# Patient Record
Sex: Male | Born: 1975 | Race: White | Hispanic: No | Marital: Married | Smoking: Never smoker
Health system: Southern US, Community
[De-identification: ages and names within clinical notes are randomized; demographics above are authoritative.]

## PROBLEM LIST (undated history)

## (undated) DIAGNOSIS — M1000 Idiopathic gout, unspecified site: Secondary | ICD-10-CM

## (undated) DIAGNOSIS — D751 Secondary polycythemia: Secondary | ICD-10-CM

## (undated) DIAGNOSIS — I82409 Acute embolism and thrombosis of unspecified deep veins of unspecified lower extremity: Secondary | ICD-10-CM

## (undated) DIAGNOSIS — M79671 Pain in right foot: Secondary | ICD-10-CM

## (undated) DIAGNOSIS — I10 Essential (primary) hypertension: Secondary | ICD-10-CM

## (undated) DIAGNOSIS — F419 Anxiety disorder, unspecified: Secondary | ICD-10-CM

## (undated) DIAGNOSIS — I509 Heart failure, unspecified: Secondary | ICD-10-CM

## (undated) HISTORY — DX: Essential (primary) hypertension: I10

## (undated) HISTORY — DX: Idiopathic gout, unspecified site: M10.00

## (undated) HISTORY — PX: OTHER SURGICAL HISTORY: SHX169

---

## 2004-02-23 ENCOUNTER — Emergency Department (HOSPITAL_COMMUNITY): Admission: EM | Admit: 2004-02-23 | Discharge: 2004-02-23 | Payer: Self-pay | Admitting: *Deleted

## 2005-08-10 ENCOUNTER — Ambulatory Visit (HOSPITAL_COMMUNITY): Admission: RE | Admit: 2005-08-10 | Discharge: 2005-08-10 | Payer: Self-pay | Admitting: Family Medicine

## 2008-01-12 ENCOUNTER — Emergency Department (HOSPITAL_COMMUNITY): Admission: EM | Admit: 2008-01-12 | Discharge: 2008-01-13 | Payer: Self-pay | Admitting: Emergency Medicine

## 2009-08-27 ENCOUNTER — Emergency Department (HOSPITAL_COMMUNITY): Admission: EM | Admit: 2009-08-27 | Discharge: 2009-08-27 | Payer: Self-pay | Admitting: Emergency Medicine

## 2010-11-24 LAB — DIFFERENTIAL
Basophils Absolute: 0
Basophils Relative: 0
Eosinophils Absolute: 0.2
Lymphs Abs: 2.7
Monocytes Absolute: 0.5
Neutro Abs: 3.3
Neutrophils Relative %: 49

## 2010-11-24 LAB — CBC
HCT: 43.3
Hemoglobin: 15.4
MCHC: 35.6
MCV: 95
RBC: 4.56

## 2010-11-24 LAB — COMPREHENSIVE METABOLIC PANEL
AST: 34
Albumin: 4.1
Alkaline Phosphatase: 43
BUN: 13
Calcium: 8.9
Creatinine, Ser: 1.01
Potassium: 3.6
Sodium: 139

## 2010-11-24 LAB — POCT CARDIAC MARKERS
Myoglobin, poc: 55.8
Troponin i, poc: <0.05

## 2011-06-24 ENCOUNTER — Encounter (INDEPENDENT_AMBULATORY_CARE_PROVIDER_SITE_OTHER): Payer: Self-pay | Admitting: Surgery

## 2011-06-28 ENCOUNTER — Ambulatory Visit (INDEPENDENT_AMBULATORY_CARE_PROVIDER_SITE_OTHER): Payer: Worker's Compensation | Admitting: Surgery

## 2011-06-28 ENCOUNTER — Encounter (INDEPENDENT_AMBULATORY_CARE_PROVIDER_SITE_OTHER): Payer: Self-pay | Admitting: Surgery

## 2011-06-28 VITALS — BP 132/86 | HR 98 | Temp 97.5°F | Resp 16 | Ht 74.0 in | Wt 240.6 lb

## 2011-06-28 DIAGNOSIS — K4090 Unilateral inguinal hernia, without obstruction or gangrene, not specified as recurrent: Secondary | ICD-10-CM | POA: Insufficient documentation

## 2011-06-28 NOTE — Progress Notes (Signed)
Patient ID: Ryan Gibson, male   DOB: [birth year], 36 y.o.   MRN: [id number]  Chief Complaint  Patient presents with  . New Evaluation    New Pt. Eval ing hernia    HPI Ryan Gibson is a 36 y.o. male.  Workman's Comp - [name]; [employer]  [phone] Ryan Gibson [email or web address]  HPI The patient comes in for a second opinion regarding a right inguinal hernia.  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 He was participating in physical training when he felt a pop in his right groin. He has had severe pain in his groin since that time. Reportedly he had an ultrasound performed near [facility] that showed an inguinal hernia. He was evaluated by a surgeon at [facility] but they were unable to palpate a hernia. He remains fairly tender in his right groin. This pain extends down into his right testicle. He comes in today for surgical evaluation Past Medical History  Diagnosis Date  . Asthma   . Hypertension     History reviewed. No pertinent past surgical history.  Family History  Problem Relation Age of Onset  . Heart disease Father     Social History History  Substance Use Topics  . Smoking status: Never Smoker   . Smokeless tobacco: Not on file  . Alcohol Use: Yes     Social    No Known Allergies  Current Outpatient Prescriptions  Medication Sig Dispense Refill  . HYDROcodone-acetaminophen (NORCO) 5-325 MG per tablet Take 1 tablet by mouth every 6 (six) hours as needed.      Marland Kitchen lisinopril (PRINIVIL,ZESTRIL) 20 MG tablet Take 20 mg by mouth daily.        Review of Systems Review of Systems  Constitutional: Negative for fever, chills and unexpected weight change.  HENT: Negative for hearing loss, congestion, sore throat, trouble swallowing and voice change.   Eyes: Negative for visual disturbance.  Respiratory: Negative for cough and wheezing.   Cardiovascular: Negative for chest pain,  palpitations and leg swelling.  Gastrointestinal: Negative for nausea, vomiting, abdominal pain, diarrhea, constipation, blood in stool, abdominal distention, anal bleeding and rectal pain.  Genitourinary: Positive for testicular pain. Negative for hematuria and difficulty urinating.  Musculoskeletal: Negative for arthralgias.  Skin: Negative for rash and wound.  Neurological: Negative for seizures, syncope, weakness and headaches.  Hematological: Negative for adenopathy. Does not bruise/bleed easily.  Psychiatric/Behavioral: Negative for confusion.    Blood pressure 132/86, pulse 98, temperature 97.5 F (36.4 C), temperature source Temporal, resp. rate 16, height 6\' 2"  (1.88 m), weight 240 lb 9.6 oz (109.135 kg).  Physical Exam Physical Exam WDWN in NAD HEENT:  EOMI, sclera anicteric Neck:  No masses, no thyromegaly Lungs:  CTA bilaterally; normal respiratory effort CV:  Regular rate and rhythm; no murmurs Abd:  +bowel sounds, soft, non-tender, no masses GU:  Bilateral descended testes with no testicular masses; tender right testicle; No sign of left inguinal hernia Right inguinal hernia - small spontaneously reducible hernia with significant tenderness at the external ring Ext:  Well-perfused; no edema Skin:  Warm, dry; no sign of jaundice  Data Reviewed none  Assessment    Right inguinal hernia      Plan    Right inguinal hernia repair with mesh.  The surgical procedure has been discussed with the patient.  Potential risks, benefits, alternative treatments, and expected outcomes have been explained.  All of the patient's questions at  this time have been answered.  The likelihood of reaching the patient's treatment goal is good.  The patient understand the proposed surgical procedure and wishes to proceed.         Ryan Gibson K. 06/28/2011, 11:22 AM

## 2011-07-04 ENCOUNTER — Encounter (HOSPITAL_COMMUNITY): Payer: Self-pay | Admitting: *Deleted

## 2011-07-04 ENCOUNTER — Encounter (HOSPITAL_COMMUNITY): Payer: Self-pay

## 2011-07-05 ENCOUNTER — Encounter (HOSPITAL_COMMUNITY)
Admission: RE | Admit: 2011-07-05 | Discharge: 2011-07-05 | Disposition: A | Discharge status: Home / Self Care | Payer: Worker's Compensation | Source: Ambulatory Visit | Attending: Surgery | Admitting: Surgery

## 2011-07-05 ENCOUNTER — Ambulatory Visit (HOSPITAL_COMMUNITY)
Admission: RE | Admit: 2011-07-05 | Discharge: 2011-07-05 | Disposition: A | Discharge status: Home / Self Care | Payer: Worker's Compensation | Source: Ambulatory Visit | Attending: Surgery | Admitting: Surgery

## 2011-07-05 ENCOUNTER — Encounter (HOSPITAL_COMMUNITY): Payer: Self-pay | Admitting: Pharmacy Technician

## 2011-07-05 DIAGNOSIS — Z01818 Encounter for other preprocedural examination: Secondary | ICD-10-CM | POA: Insufficient documentation

## 2011-07-05 DIAGNOSIS — K4090 Unilateral inguinal hernia, without obstruction or gangrene, not specified as recurrent: Secondary | ICD-10-CM | POA: Insufficient documentation

## 2011-07-05 LAB — BASIC METABOLIC PANEL
CO2: 28 mEq/L (ref 19–32)
Calcium: 9.3 mg/dL (ref 8.4–10.5)
Chloride: 103 mEq/L (ref 96–112)
Creatinine, Ser: 0.96 mg/dL (ref 0.50–1.35)
Potassium: 3.8 mEq/L (ref 3.5–5.1)
Sodium: 140 mEq/L (ref 135–145)

## 2011-07-05 LAB — CBC
Hemoglobin: 15.6 g/dL (ref 13.0–17.0)
MCH: 33 pg (ref 26.0–34.0)
MCHC: 36.4 g/dL — ABNORMAL HIGH (ref 30.0–36.0)
MCV: 90.7 fL (ref 78.0–100.0)
RBC: 4.73 MIL/uL (ref 4.22–5.81)
RDW: 11.9 % (ref 11.5–15.5)
WBC: 6.4 10*3/uL (ref 4.0–10.5)

## 2011-07-05 LAB — SURGICAL PCR SCREEN: MRSA, PCR: POSITIVE — AB

## 2011-07-05 NOTE — Patient Instructions (Addendum)
YOUR SURGERY IS SCHEDULED ON:  WED  5/22  AT 1:25 PM  REPORT TO [facility] AT:  11:00 AM      PHONE # FOR SHORT STAY IS [phone]  DO NOT EAT ANYTHING AFTER MIDNIGHT THE NIGHT BEFORE YOUR SURGERY.   NO FOOD, NO CHEWING GUM, NO MINTS, NO CANDIES, NO CHEWING TOBACCO.   YOU MAY HAVE CLEAR LIQUIDS TO DRINK FROM MIDNIGHT UNTIL 7:00 AM THE DAY OF YOUR SURGERY--NOTHING TO DRINK AFTER 7AM DAY OF SURGERY!!!       CLEAR LIQUIDS SUCH AS WATER, SODA, TEA--NO MILK OR MILK PRODUCTS.  PLEASE TAKE THE FOLLOWING MEDICATIONS THE AM OF YOUR SURGERY WITH A FEW SIPS OF WATER: HYDROCODONE IF NEEDED FOR PAIN    IF YOU USE INHALERS--USE YOUR INHALERS THE AM OF YOUR SURGERY AND BRING INHALERS TO THE HOSPITAL -TAKE TO SURGERY.    IF YOU ARE DIABETIC:  DO NOT TAKE ANY DIABETIC MEDICATIONS THE AM OF YOUR SURGERY.  IF YOU TAKE INSULIN IN THE EVENINGS--PLEASE ONLY TAKE 1/2 NORMAL EVENING DOSE THE NIGHT BEFORE YOUR SURGERY.  NO INSULIN THE AM OF YOUR SURGERY.  IF YOU HAVE SLEEP APNEA AND USE CPAP OR BIPAP--PLEASE BRING THE MASK --NOT THE MACHINE-NOT THE TUBING   -JUST THE MASK. DO NOT BRING VALUABLES, MONEY, CREDIT CARDS.  CONTACT LENS, DENTURES / PARTIALS, GLASSES SHOULD NOT BE WORN TO SURGERY AND IN MOST CASES-HEARING AIDS WILL NEED TO BE REMOVED.  BRING YOUR GLASSES CASE, ANY EQUIPMENT NEEDED FOR YOUR CONTACT LENS. FOR PATIENTS ADMITTED TO THE HOSPITAL--CHECK OUT TIME THE DAY OF DISCHARGE IS 11:00 AM.  ALL INPATIENT ROOMS ARE PRIVATE - WITH BATHROOM, TELEPHONE, TELEVISION AND WIFI INTERNET. IF YOU ARE BEING DISCHARGED THE SAME DAY OF YOUR SURGERY--YOU CAN NOT DRIVE YOURSELF HOME--AND SHOULD NOT GO HOME ALONE BY TAXI OR BUS.  NO DRIVING OR OPERATING MACHINERY FOR 24 HOURS FOLLOWING ANESTHESIA / PAIN MEDICATIONS.                            SPECIAL INSTRUCTIONS:  CHLORHEXIDINE SOAP SHOWER (other brand names are Betasept and Hibiclens ) PLEASE SHOWER WITH CHLORHEXIDINE THE NIGHT BEFORE YOUR SURGERY AND  THE AM OF YOUR SURGERY. DO NOT USE CHLORHEXIDINE ON YOUR FACE OR PRIVATE AREAS--YOU MAY USE YOUR NORMAL SOAP THOSE AREAS AND YOUR NORMAL SHAMPOO.  WOMEN SHOULD AVOID SHAVING UNDER ARMS AND SHAVING LEGS 48 HOURS BEFORE USING CHLORHEXIDINE TO AVOID SKIN IRRITATION.  DO NOT USE IF ALLERGIC TO CHLORHEXIDINE.  PLEASE READ OVER ANY  FACT SHEETS THAT YOU WERE GIVEN: MRSA INFORMATION

## 2011-07-05 NOTE — Pre-Procedure Instructions (Signed)
PT BROUGHT HIS EXERCISE STRESS TEST REPORT DONE [facility]/07/11 BY [facility] - "NORMAL STRESS TEST".  COPY MADE OF TEST RESULT AND PRETEST EKGS AND PLACED ON PT'S CHART. CBC, BMET, CXR WERE DONE TODAY AT [facility] PREOP.

## 2011-07-06 ENCOUNTER — Other Ambulatory Visit (INDEPENDENT_AMBULATORY_CARE_PROVIDER_SITE_OTHER): Payer: Self-pay | Admitting: Surgery

## 2011-07-06 MED ORDER — VANCOMYCIN HCL 1000 MG IV SOLR: 1000.0000 mg | Freq: Once | INTRAVENOUS | Status: DC

## 2011-07-06 NOTE — Pre-Procedure Instructions (Signed)
DR. Gennie Alma NOTE FROM YOUR OFFICE THAT YOU HAVE ORDERED VANCOMYCIN TO PT'S PREOP ANTIBIOTICS--BUT ORDER DOES NOT APPEAR IN EPIC AS OF THIS AM

## 2011-07-06 NOTE — Progress Notes (Signed)
Quick Note:  This patient may proceed with surgery. I have added Vancomycin to his preop antibiotics.  ______

## 2011-07-13 ENCOUNTER — Encounter (HOSPITAL_COMMUNITY): Payer: Self-pay | Admitting: Anesthesiology

## 2011-07-13 ENCOUNTER — Encounter (HOSPITAL_COMMUNITY)
Admission: RE | Disposition: A | Discharge status: Home / Self Care | Payer: Self-pay | Source: Ambulatory Visit | Attending: Surgery

## 2011-07-13 ENCOUNTER — Encounter (HOSPITAL_COMMUNITY): Payer: Self-pay | Admitting: *Deleted

## 2011-07-13 ENCOUNTER — Ambulatory Visit (HOSPITAL_COMMUNITY): Payer: Worker's Compensation

## 2011-07-13 ENCOUNTER — Ambulatory Visit (HOSPITAL_COMMUNITY)
Admission: RE | Admit: 2011-07-13 | Discharge: 2011-07-13 | Disposition: A | Discharge status: Home / Self Care | Payer: Worker's Compensation | Source: Ambulatory Visit | Attending: Surgery | Admitting: Surgery

## 2011-07-13 ENCOUNTER — Ambulatory Visit (HOSPITAL_COMMUNITY): Payer: Worker's Compensation | Admitting: Anesthesiology

## 2011-07-13 DIAGNOSIS — K4090 Unilateral inguinal hernia, without obstruction or gangrene, not specified as recurrent: Secondary | ICD-10-CM

## 2011-07-13 DIAGNOSIS — M25579 Pain in unspecified ankle and joints of unspecified foot: Secondary | ICD-10-CM | POA: Insufficient documentation

## 2011-07-13 DIAGNOSIS — J45909 Unspecified asthma, uncomplicated: Secondary | ICD-10-CM | POA: Insufficient documentation

## 2011-07-13 DIAGNOSIS — I10 Essential (primary) hypertension: Secondary | ICD-10-CM | POA: Insufficient documentation

## 2011-07-13 DIAGNOSIS — Z79899 Other long term (current) drug therapy: Secondary | ICD-10-CM | POA: Insufficient documentation

## 2011-07-13 HISTORY — DX: Pain in right foot: M79.671

## 2011-07-13 HISTORY — PX: INGUINAL HERNIA REPAIR: SHX194

## 2011-07-13 HISTORY — PX: HERNIA REPAIR: SHX51

## 2011-07-13 SURGERY — REPAIR, HERNIA, INGUINAL, ADULT
Anesthesia: General | Site: Groin | Laterality: Right | Wound class: Clean

## 2011-07-13 MED ORDER — ONDANSETRON HCL 4 MG/2ML IJ SOLN: 4.0000 mg | INTRAMUSCULAR | Status: DC | PRN

## 2011-07-13 MED ORDER — MIDAZOLAM HCL 5 MG/5ML IJ SOLN
INTRAMUSCULAR | Status: DC | PRN
  Administered 2011-07-13: 2 mg via INTRAVENOUS

## 2011-07-13 MED ORDER — VANCOMYCIN HCL 1000 MG IV SOLR
1500.0000 mg | Freq: Once | INTRAVENOUS | Status: AC
  Administered 2011-07-13: 1500 mg via INTRAVENOUS
  Filled 2011-07-13: qty 1500

## 2011-07-13 MED ORDER — OXYCODONE-ACETAMINOPHEN 5-325 MG PO TABS
ORAL_TABLET | ORAL | Status: AC
  Filled 2011-07-13: qty 1

## 2011-07-13 MED ORDER — FENTANYL CITRATE 0.05 MG/ML IJ SOLN
INTRAMUSCULAR | Status: DC | PRN
  Administered 2011-07-13: 100 ug via INTRAVENOUS
  Administered 2011-07-13 (×2): 50 ug via INTRAVENOUS

## 2011-07-13 MED ORDER — DIPHENHYDRAMINE HCL 50 MG/ML IJ SOLN
INTRAMUSCULAR | Status: AC
  Filled 2011-07-13: qty 1

## 2011-07-13 MED ORDER — CEFAZOLIN SODIUM-DEXTROSE 2-3 GM-% IV SOLR: 2.0000 g | INTRAVENOUS | Status: DC

## 2011-07-13 MED ORDER — HYDROMORPHONE HCL PF 1 MG/ML IJ SOLN
INTRAMUSCULAR | Status: AC
  Filled 2011-07-13: qty 1

## 2011-07-13 MED ORDER — PROMETHAZINE HCL 25 MG/ML IJ SOLN: 6.2500 mg | INTRAMUSCULAR | Status: DC | PRN

## 2011-07-13 MED ORDER — FENTANYL CITRATE 0.05 MG/ML IJ SOLN
50.0000 ug | INTRAMUSCULAR | Status: DC | PRN
  Administered 2011-07-13: 100 ug via INTRAVENOUS

## 2011-07-13 MED ORDER — DIPHENHYDRAMINE HCL 50 MG/ML IJ SOLN
25.0000 mg | Freq: Once | INTRAMUSCULAR | Status: AC
  Administered 2011-07-13: 25 mg via INTRAVENOUS

## 2011-07-13 MED ORDER — FENTANYL CITRATE 0.05 MG/ML IJ SOLN
INTRAMUSCULAR | Status: AC
  Filled 2011-07-13: qty 2

## 2011-07-13 MED ORDER — LACTATED RINGERS IV SOLN: INTRAVENOUS | Status: DC

## 2011-07-13 MED ORDER — BUPIVACAINE-EPINEPHRINE 0.25% -1:200000 IJ SOLN
INTRAMUSCULAR | Status: DC | PRN
  Administered 2011-07-13: 20 mL

## 2011-07-13 MED ORDER — OXYCODONE-ACETAMINOPHEN 5-325 MG PO TABS
1.0000 | ORAL_TABLET | ORAL | Status: DC | PRN
  Administered 2011-07-13: 1 via ORAL

## 2011-07-13 MED ORDER — HYDROMORPHONE HCL PF 1 MG/ML IJ SOLN
0.2500 mg | INTRAMUSCULAR | Status: DC | PRN
  Administered 2011-07-13 (×3): 0.5 mg via INTRAVENOUS

## 2011-07-13 MED ORDER — MEPERIDINE HCL 50 MG/ML IJ SOLN: 6.2500 mg | INTRAMUSCULAR | Status: DC | PRN

## 2011-07-13 MED ORDER — PROPOFOL 10 MG/ML IV EMUL
INTRAVENOUS | Status: DC | PRN
  Administered 2011-07-13: 200 mg via INTRAVENOUS

## 2011-07-13 MED ORDER — LACTATED RINGERS IV SOLN
INTRAVENOUS | Status: DC
  Administered 2011-07-13: 1000 mL via INTRAVENOUS

## 2011-07-13 MED ORDER — OXYCODONE-ACETAMINOPHEN 5-325 MG PO TABS: 1.0000 | ORAL_TABLET | ORAL | Status: AC | PRN

## 2011-07-13 MED ORDER — MORPHINE SULFATE 10 MG/ML IJ SOLN: 2.0000 mg | INTRAMUSCULAR | Status: DC | PRN

## 2011-07-13 SURGICAL SUPPLY — 41 items
BENZOIN TINCTURE PRP APPL 2/3 (GAUZE/BANDAGES/DRESSINGS) ×2 IMPLANT
BLADE HEX COATED 2.75 (ELECTRODE) ×2 IMPLANT
CHLORAPREP W/TINT 26ML (MISCELLANEOUS) ×2 IMPLANT
CLOTH BEACON ORANGE TIMEOUT ST (SAFETY) ×2 IMPLANT
COVER SURGICAL LIGHT HANDLE (MISCELLANEOUS) ×2 IMPLANT
DECANTER SPIKE VIAL GLASS SM (MISCELLANEOUS) ×2 IMPLANT
DISSECTOR ROUND CHERRY 3/8 STR (MISCELLANEOUS) ×2 IMPLANT
DRAIN PENROSE 18X1/2 LTX STRL (DRAIN) ×2 IMPLANT
DRAPE LAPAROTOMY TRNSV 102X78 (DRAPE) ×2 IMPLANT
DRAPE UTILITY XL STRL (DRAPES) ×2 IMPLANT
DRSG TEGADERM 4X4.75 (GAUZE/BANDAGES/DRESSINGS) IMPLANT
ELECT REM PT RETURN 9FT ADLT (ELECTROSURGICAL) ×2
ELECTRODE REM PT RTRN 9FT ADLT (ELECTROSURGICAL) ×1 IMPLANT
GAUZE SPONGE 4X4 16PLY XRAY LF (GAUZE/BANDAGES/DRESSINGS) IMPLANT
GLOVE BIO SURGEON STRL SZ7 (GLOVE) ×2 IMPLANT
GLOVE BIOGEL PI IND STRL 7.5 (GLOVE) ×3 IMPLANT
GLOVE BIOGEL PI INDICATOR 7.5 (GLOVE) ×3
GOWN STRL NON-REIN LRG LVL3 (GOWN DISPOSABLE) ×4 IMPLANT
GOWN STRL REIN XL XLG (GOWN DISPOSABLE) ×2 IMPLANT
KIT BASIN OR (CUSTOM PROCEDURE TRAY) ×2 IMPLANT
MESH ULTRAPRO 3X6 7.6X15CM (Mesh General) ×2 IMPLANT
NEEDLE HYPO 25X1 1.5 SAFETY (NEEDLE) ×2 IMPLANT
NS IRRIG 1000ML POUR BTL (IV SOLUTION) ×2 IMPLANT
PACK GENERAL/GYN (CUSTOM PROCEDURE TRAY) ×2 IMPLANT
SPONGE GAUZE 4X4 12PLY (GAUZE/BANDAGES/DRESSINGS) ×2 IMPLANT
STRIP CLOSURE SKIN 1/2X4 (GAUZE/BANDAGES/DRESSINGS) ×2 IMPLANT
SUT MNCRL AB 4-0 PS2 18 (SUTURE) ×2 IMPLANT
SUT PROLENE 2 0 SH DA (SUTURE) ×2 IMPLANT
SUT SILK 2 0 SH (SUTURE) IMPLANT
SUT SILK 3 0 (SUTURE)
SUT SILK 3-0 18XBRD TIE 12 (SUTURE) IMPLANT
SUT VIC AB 2-0 CT2 27 (SUTURE) IMPLANT
SUT VIC AB 2-0 SH 27 (SUTURE) ×1
SUT VIC AB 2-0 SH 27X BRD (SUTURE) ×1 IMPLANT
SUT VIC AB 3-0 SH 27 (SUTURE) ×1
SUT VIC AB 3-0 SH 27X BRD (SUTURE) ×1 IMPLANT
SUT VIC AB 3-0 SH 27XBRD (SUTURE) IMPLANT
SUT VICRYL 0 27 CT2 27 ABS (SUTURE) IMPLANT
SUT VICRYL 0 UR6 27IN ABS (SUTURE) IMPLANT
SYR CONTROL 10ML LL (SYRINGE) ×2 IMPLANT
TOWEL OR 17X26 10 PK STRL BLUE (TOWEL DISPOSABLE) ×4 IMPLANT

## 2011-07-13 NOTE — Progress Notes (Signed)
Pt received from CRNA. Pt very  active, appears to be itching, possible hives on R arm.  Vanc infusion stopped with about remaining.  Dr Acey Lav notified and orders received.

## 2011-07-13 NOTE — OR Nursing (Signed)
Patient c/o swelling and painful right foot unable to put weight on right foot, has not had any xray done

## 2011-07-13 NOTE — Anesthesia Preprocedure Evaluation (Addendum)
Anesthesia Evaluation  Patient identified by MRN, date of birth, ID band Patient awake    Reviewed: Allergy & Precautions, H&P , NPO status , Patient's Chart, lab work & pertinent test results  Airway Mallampati: II TM Distance: >3 FB Neck ROM: Full    Dental No notable dental hx.    Pulmonary neg pulmonary ROS,  breath sounds clear to auscultation  Pulmonary exam normal       Cardiovascular hypertension, Pt. on medications negative cardio ROS  Rhythm:Regular Rate:Normal     Neuro/Psych negative neurological ROS  negative psych ROS   GI/Hepatic negative GI ROS, Neg liver ROS,   Endo/Other  negative endocrine ROS  Renal/GU negative Renal ROS  negative genitourinary   Musculoskeletal negative musculoskeletal ROS (+)   Abdominal   Peds negative pediatric ROS (+)  Hematology negative hematology ROS (+)   Anesthesia Other Findings   Reproductive/Obstetrics negative OB ROS                          Anesthesia Physical Anesthesia Plan  ASA: II  Anesthesia Plan: General   Post-op Pain Management:    Induction: Intravenous  Airway Management Planned: Oral ETT  Additional Equipment:   Intra-op Plan:   Post-operative Plan: Extubation in OR  Informed Consent: I have reviewed the patients History and Physical, chart, labs and discussed the procedure including the risks, benefits and alternatives for the proposed anesthesia with the patient or authorized representative who has indicated his/her understanding and acceptance.   Dental advisory given  Plan Discussed with: CRNA  Anesthesia Plan Comments:         Anesthesia Quick Evaluation  

## 2011-07-13 NOTE — Op Note (Signed)
Hernia, Open, Procedure Note  Indications: The patient presented with a history of a right, reducible inguinal hernia.    Pre-operative Diagnosis: right reducible inguinal hernia Post-operative Diagnosis: same  Surgeon: Wynona Luna.   Assistants: none  Anesthesia: General LMA anesthesia  ASA Class: 2  Procedure Details  The patient was seen again in the Holding Room. The risks, benefits, complications, treatment options, and expected outcomes were discussed with the patient. The possibilities of reaction to medication, pulmonary aspiration, perforation of viscus, bleeding, recurrent infection, the need for additional procedures, and development of a complication requiring transfusion or further operation were discussed with the patient and/or family. The likelihood of success in repairing the hernia and returning the patient to their previous functional status is good.  There was concurrence with the proposed plan, and informed consent was obtained. The site of surgery was properly noted/marked. The patient was taken to the Operating Room, identified as Ryan Gibson, and the procedure verified as right inguinal hernia repair. A Time Out was held and the above information confirmed.  The patient was placed in the supine position and underwent induction of anesthesia. The lower abdomen and groin was prepped with Chloraprep and draped in the standard fashion, and 0.5% Marcaine with epinephrine was used to anesthetize the skin over the mid-portion of the inguinal canal. An oblique incision was made. Dissection was carried down through the subcutaneous tissue with cautery to the external oblique fascia.  We opened the external oblique fascia along the direction of its fibers to the external ring.  The spermatic cord was circumferentially dissected bluntly and retracted with a Penrose drain.  The floor of the inguinal canal was inspected and was intact.  We skeletonized the spermatic cord and  reduced a medium-sized indirect hernia sac.  We used a 3 x 6 inch piece of Ultrapro mesh, which was cut into a keyhole shape.  This was secured with 2-0 Prolene, beginning at the pubic tubercle, running this along the internal oblique fascia superiorly and the shelving edge inferiorly.  The tails of the mesh were sutured together behind the spermatic cord.  The mesh was tucked underneath the external oblique fascia laterally.  The external oblique fascia was reapproximated with 2-0 Vicryl.  3-0 Vicryl was used to close the subcutaneous tissues and 4-0 Monocryl was used to close the skin in subcuticular fashion.  Benzoin and steri-strips were used to seal the incision.  A clean dressing was applied.  The patient was then extubated and brought to the recovery room in stable condition.  All sponge, instrument, and needle counts were correct prior to closure and at the conclusion of the case.   Estimated Blood Loss: Minimal                 Complications: None; patient tolerated the procedure well.         Disposition: PACU - hemodynamically stable.         Condition: stable  Wilmon Arms. Corliss Skains, MD, [facility]  07/13/2011 2:26 PM

## 2011-07-13 NOTE — Discharge Instructions (Signed)
[facility] ° ° INGUINAL HERNIA REPAIR: POST OP INSTRUCTIONS ° °Always review your discharge instruction sheet given to you by the facility where your surgery was performed. °IF YOU HAVE DISABILITY OR FAMILY LEAVE FORMS, YOU MUST BRING THEM TO THE OFFICE FOR PROCESSING.   °DO NOT GIVE THEM TO YOUR DOCTOR. ° °1. A  prescription for pain medication may be given to you upon discharge.  Take your pain medication as prescribed, if needed.  If narcotic pain medicine is not needed, then you may take acetaminophen (Tylenol) or ibuprofen (Advil) as needed. °2. Take your usually prescribed medications unless otherwise directed. °3. If you need a refill on your pain medication, please contact your pharmacy.  They will contact our office to request authorization. Prescriptions will not be filled after 5 pm or on week-ends. °4. You should follow a light diet the first 24 hours after arrival home, such as soup and crackers, etc.  Be sure to include lots of fluids daily.  Resume your normal diet the day after surgery. °5. Most patients will experience some swelling and bruising around the umbilicus or in the groin and scrotum.  Ice packs and reclining will help.  Swelling and bruising can take several days to resolve.  °6. It is common to experience some constipation if taking pain medication after surgery.  Increasing fluid intake and taking a stool softener (such as Colace) will usually help or prevent this problem from occurring.  A mild laxative (Milk of Magnesia or Miralax) should be taken according to package directions if there are no bowel movements after 48 hours. °7. Unless discharge instructions indicate otherwise, you may remove your bandages 24-48 hours after surgery, and you may shower at that time.  You will have steri-strips (small skin tapes) in place directly over the incision.  These strips should be left on the skin for 7-10 days. °8. ACTIVITIES:  You may resume regular (light) daily activities  beginning the next day--such as daily self-care, walking, climbing stairs--gradually increasing activities as tolerated.  You may have sexual intercourse when it is comfortable.  Refrain from any heavy lifting or straining until approved by your doctor. °a. You may drive when you are no longer taking prescription pain medication, you can comfortably wear a seatbelt, and you can safely maneuver your car and apply brakes. °b. RETURN TO WORK:  2-3 weeks with light duty - no lifting over 15 lbs. °9. You should see your doctor in the office for a follow-up appointment approximately 2-3 weeks after your surgery.  Make sure that you call for this appointment within a day or two after you arrive home to insure a convenient appointment time. °10. OTHER INSTRUCTIONS:  __________________________________________________________________________________________________________________________________________________________________________________________  °WHEN TO CALL YOUR DOCTOR: °1. Fever over 101.0 °2. Inability to urinate °3. Nausea and/or vomiting °4. Extreme swelling or bruising °5. Continued bleeding from incision. °6. Increased pain, redness, or drainage from the incision ° °The clinic staff is available to answer your questions during regular business hours.  Please don’t hesitate to call and ask to speak to one of the nurses for clinical concerns.  If you have a medical emergency, go to the nearest emergency room or call 911.  A surgeon from [facility] is always on call at the hospital ° ° °[address], [address], [place], [place]  [address] ? ° [address] [address], [place], [place]   [address] °[phone]    [phone]    FAX [phone] °Web site:   [email or web address] ° ° °

## 2011-07-13 NOTE — Interval H&P Note (Signed)
History and Physical Interval Note:  07/13/2011 12:51 PM  Ryan Gibson  has presented today for surgery, with the diagnosis of right inguinal hernia   The various methods of treatment have been discussed with the patient and family. After consideration of risks, benefits and other options for treatment, the patient has consented to  Procedure(s) (LRB): HERNIA REPAIR INGUINAL ADULT (Right) INSERTION OF MESH (Right) as a surgical intervention .  The patients' history has been reviewed, patient examined, no change in status, stable for surgery.  I have reviewed the patients' chart and labs.  Questions were answered to the patient's satisfaction.     Rhenda Oregon K.

## 2011-07-13 NOTE — Anesthesia Postprocedure Evaluation (Signed)
  Anesthesia Post-op Note  Patient: Ryan Gibson  Procedure(s) Performed: Procedure(s) (LRB): HERNIA REPAIR INGUINAL ADULT (Right) INSERTION OF MESH (Right)  Patient Location: PACU  Anesthesia Type: General  Level of Consciousness: awake and alert   Airway and Oxygen Therapy: Patient Spontanous Breathing  Post-op Pain: mild  Post-op Assessment: Post-op Vital signs reviewed, Patient's Cardiovascular Status Stable, Respiratory Function Stable, Patent Airway and No signs of Nausea or vomiting  Post-op Vital Signs: stable  Complications: No apparent anesthesia complications

## 2011-07-13 NOTE — Transfer of Care (Signed)
Immediate Anesthesia Transfer of Care Note  Patient: Ryan Gibson  Procedure(s) Performed: Procedure(s) (LRB): HERNIA REPAIR INGUINAL ADULT (Right) INSERTION OF MESH (Right)  Patient Location: PACU  Anesthesia Type: General  Level of Consciousness: awake, sedated and patient cooperative  Airway & Oxygen Therapy: Patient Spontanous Breathing and Patient connected to face mask oxygen  Post-op Assessment: Report given to PACU RN and Post -op Vital signs reviewed and stable  Post vital signs: Reviewed and stable  Complications: No apparent anesthesia complications

## 2011-07-13 NOTE — H&P (View-Only) (Signed)
Patient ID: Ryan Gibson, male   DOB: [birth year], 36 y.o.   MRN: [id number]  Chief Complaint  Patient presents with  . New Evaluation    New Pt. Eval ing hernia    HPI Ryan Gibson is a 36 y.o. male.  Workman's Comp - [name]; [employer]  [phone] [name] [phone] [name]  HPI The patient comes in for a second opinion regarding a right inguinal hernia.  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 He was participating in physical training when he felt a pop in his right groin. He has had severe pain in his groin since that time. Reportedly he had an ultrasound performed near [facility] that showed an inguinal hernia. He was evaluated by a surgeon at [facility] but they were unable to palpate a hernia. He remains fairly tender in his right groin. This pain extends down into his right testicle. He comes in today for surgical evaluation Past Medical History  Diagnosis Date  . Asthma   . Hypertension     History reviewed. No pertinent past surgical history.  Family History  Problem Relation Age of Onset  . Heart disease Father     Social History History  Substance Use Topics  . Smoking status: Never Smoker   . Smokeless tobacco: Not on file  . Alcohol Use: Yes     Social    No Known Allergies  Current Outpatient Prescriptions  Medication Sig Dispense Refill  . HYDROcodone-acetaminophen (NORCO) 5-325 MG per tablet Take 1 tablet by mouth every 6 (six) hours as needed.      Marland Kitchen lisinopril (PRINIVIL,ZESTRIL) 20 MG tablet Take 20 mg by mouth daily.        Review of Systems Review of Systems  Constitutional: Negative for fever, chills and unexpected weight change.  HENT: Negative for hearing loss, congestion, sore throat, trouble swallowing and voice change.   Eyes: Negative for visual disturbance.  Respiratory: Negative for cough and wheezing.   Cardiovascular: Negative for chest pain,  palpitations and leg swelling.  Gastrointestinal: Negative for nausea, vomiting, abdominal pain, diarrhea, constipation, blood in stool, abdominal distention, anal bleeding and rectal pain.  Genitourinary: Positive for testicular pain. Negative for hematuria and difficulty urinating.  Musculoskeletal: Negative for arthralgias.  Skin: Negative for rash and wound.  Neurological: Negative for seizures, syncope, weakness and headaches.  Hematological: Negative for adenopathy. Does not bruise/bleed easily.  Psychiatric/Behavioral: Negative for confusion.    Blood pressure 132/86, pulse 98, temperature 97.5 F (36.4 C), temperature source Temporal, resp. rate 16, height 6\' 2"  (1.88 m), weight 240 lb 9.6 oz (109.135 kg).  Physical Exam Physical Exam WDWN in NAD HEENT:  EOMI, sclera anicteric Neck:  No masses, no thyromegaly Lungs:  CTA bilaterally; normal respiratory effort CV:  Regular rate and rhythm; no murmurs Abd:  +bowel sounds, soft, non-tender, no masses GU:  Bilateral descended testes with no testicular masses; tender right testicle; No sign of left inguinal hernia Right inguinal hernia - small spontaneously reducible hernia with significant tenderness at the external ring Ext:  Well-perfused; no edema Skin:  Warm, dry; no sign of jaundice  Data Reviewed none  Assessment    Right inguinal hernia      Plan    Right inguinal hernia repair with mesh.  The surgical procedure has been discussed with the patient.  Potential risks, benefits, alternative treatments, and expected outcomes have been explained.  All of the patient's questions at  this time have been answered.  The likelihood of reaching the patient's treatment goal is good.  The patient understand the proposed surgical procedure and wishes to proceed.         [name] K. 06/28/2011, 11:22 AM

## 2011-07-14 ENCOUNTER — Encounter (HOSPITAL_COMMUNITY): Payer: Self-pay | Admitting: Surgery

## 2011-07-14 ENCOUNTER — Telehealth (INDEPENDENT_AMBULATORY_CARE_PROVIDER_SITE_OTHER): Payer: Self-pay | Admitting: General Surgery

## 2011-07-14 NOTE — Telephone Encounter (Signed)
Pt's wife calling for advice on pain meds.  Percocet is making him nauseated, even when taking with food.  Hydrocodone 5/325 mg,  #30, 1-2 po [id number] prn pain, NO refill called to [facility] [address]:  [phone].

## 2011-07-29 ENCOUNTER — Encounter (INDEPENDENT_AMBULATORY_CARE_PROVIDER_SITE_OTHER): Payer: Worker's Compensation | Admitting: Surgery

## 2011-08-01 ENCOUNTER — Telehealth (INDEPENDENT_AMBULATORY_CARE_PROVIDER_SITE_OTHER): Payer: Self-pay | Admitting: General Surgery

## 2011-08-01 NOTE — Telephone Encounter (Signed)
Representative with [employer], the Worker [employer] carrier, calling with blocked phone number, inquiring on the status of the pt's progress.  Verified surgery has been performed and pt has post op visit scheduled for 08/11/11.

## 2011-08-11 ENCOUNTER — Encounter (INDEPENDENT_AMBULATORY_CARE_PROVIDER_SITE_OTHER): Payer: Self-pay | Admitting: Surgery

## 2011-08-11 ENCOUNTER — Ambulatory Visit (INDEPENDENT_AMBULATORY_CARE_PROVIDER_SITE_OTHER): Payer: Worker's Compensation | Admitting: Surgery

## 2011-08-11 VITALS — BP 140/98 | HR 108 | Temp 97.6°F | Resp 18 | Ht 74.0 in | Wt 241.5 lb

## 2011-08-11 DIAGNOSIS — K4090 Unilateral inguinal hernia, without obstruction or gangrene, not specified as recurrent: Secondary | ICD-10-CM

## 2011-08-11 NOTE — Progress Notes (Signed)
S/p RIH repair with mesh on 07/13/11 for a moderate-sized indirect hernia.  He is having some residual soreness around his external ring, but his repair seems to be intact.  Appetite and bowel movements are normal.  Urination is normal.  The incision is well-healed with no sign of infection.  He has some firmness under his incision as expected, but no recurrence of the hernia.  Slowly increase activity over the next 3 weeks, then resume full activity in 3 weeks.  Wilmon Arms. Corliss Skains, MD, [facility]  08/11/2011 10:11 AM

## 2011-09-15 ENCOUNTER — Ambulatory Visit (INDEPENDENT_AMBULATORY_CARE_PROVIDER_SITE_OTHER): Payer: Worker's Compensation | Admitting: Surgery

## 2011-09-15 ENCOUNTER — Encounter (INDEPENDENT_AMBULATORY_CARE_PROVIDER_SITE_OTHER): Payer: Self-pay | Admitting: Surgery

## 2011-09-15 VITALS — BP 130/86 | HR 88 | Temp 98.2°F | Resp 14 | Ht 74.0 in | Wt 253.0 lb

## 2011-09-15 DIAGNOSIS — K4090 Unilateral inguinal hernia, without obstruction or gangrene, not specified as recurrent: Secondary | ICD-10-CM

## 2011-09-15 NOTE — Progress Notes (Signed)
Status post right inguinal hernia repair with mesh on 07/13/11. The patient is doing well. When he is exercising he still feels a little bit of soreness in his groin. He denies any swelling. He just wants to have this rechecked before he returns to full duty.  On examination, his incision is well healed with no sign of infection. No hematoma or seroma is noted. No sign of recurrent inguinal hernia. There is no movement at the external ring when he is standing with Valsalva maneuver.  He may resume full activity. Followup p.r.n. He may use ibuprofen p.r.n. for soreness.  Wilmon Arms. Corliss Skains, MD, [facility]  09/15/2011 12:59 PM

## 2011-09-15 NOTE — Patient Instructions (Addendum)
Resume full activity.  No restrictions.   

## 2014-03-10 ENCOUNTER — Ambulatory Visit (INDEPENDENT_AMBULATORY_CARE_PROVIDER_SITE_OTHER): Payer: 59 | Admitting: Family Medicine

## 2014-03-10 ENCOUNTER — Encounter: Payer: Self-pay | Admitting: Family Medicine

## 2014-03-10 VITALS — BP 152/98 | Temp 98.1°F | Ht 74.0 in | Wt 270.0 lb

## 2014-03-10 DIAGNOSIS — I10 Essential (primary) hypertension: Secondary | ICD-10-CM

## 2014-03-10 DIAGNOSIS — I889 Nonspecific lymphadenitis, unspecified: Secondary | ICD-10-CM

## 2014-03-10 MED ORDER — LISINOPRIL 10 MG PO TABS
10.0000 mg | ORAL_TABLET | Freq: Every day | ORAL | Status: DC
Start: 2014-03-10 — End: 2014-10-29

## 2014-03-10 MED ORDER — AZITHROMYCIN 250 MG PO TABS: ORAL_TABLET | ORAL | Status: DC

## 2014-03-10 NOTE — Progress Notes (Signed)
Subjective:    Patient ID: Ryan Gibson, male    DOB: 08/17/1975, 39 y.o.   MRN: [id number]  Sore Throat  This is a new problem. Episode onset: Saturday. The problem has been gradually worsening. There has been no fever. Associated symptoms include congestion and coughing. Associated symptoms comments: Wheezing, body aches. He has tried nothing for the symptoms.   Home felt rundown and achey  Felt run down with no energy throat sore and achey  Throat felt very inflammed  Coffee eased it some  No sig  cough a couple bouts   Headache minimal   Pos throat   Felt chills at time, but no fever   Felt nauseated   Review of Systems  HENT: Positive for congestion.   Respiratory: Positive for cough.        Objective:   Physical Exam  Alert blood pressure 152/94 on repeat HEENT moderate his congestion frontal tenderness pharynx erythematous anterior glands tender but otherwise normal neck supple lungs clear heart regular in rhythm.    Assessment & Plan:  Impression 1 pharyngitis/lymphadenitis #2 hypertension patient is been off medication secondary to no insurance plan resume lisinopril in half dose. Has other chronic problems encouraged follow-up. Z-Pak prescribed. WSL

## 2014-04-14 ENCOUNTER — Ambulatory Visit: Payer: 59 | Admitting: Family Medicine

## 2014-09-16 ENCOUNTER — Ambulatory Visit (INDEPENDENT_AMBULATORY_CARE_PROVIDER_SITE_OTHER): Payer: 59 | Admitting: Nurse Practitioner

## 2014-09-16 ENCOUNTER — Ambulatory Visit (HOSPITAL_COMMUNITY)
Admission: RE | Admit: 2014-09-16 | Discharge: 2014-09-16 | Disposition: A | Discharge status: Home / Self Care | Payer: 59 | Source: Ambulatory Visit | Attending: Nurse Practitioner | Admitting: Nurse Practitioner

## 2014-09-16 ENCOUNTER — Encounter: Payer: Self-pay | Admitting: Nurse Practitioner

## 2014-09-16 ENCOUNTER — Encounter: Payer: Self-pay | Admitting: Family Medicine

## 2014-09-16 VITALS — BP 150/94 | Ht 74.0 in

## 2014-09-16 DIAGNOSIS — Z79899 Other long term (current) drug therapy: Secondary | ICD-10-CM

## 2014-09-16 DIAGNOSIS — M25571 Pain in right ankle and joints of right foot: Secondary | ICD-10-CM | POA: Diagnosis present

## 2014-09-16 DIAGNOSIS — M2550 Pain in unspecified joint: Secondary | ICD-10-CM

## 2014-09-16 DIAGNOSIS — M25471 Effusion, right ankle: Secondary | ICD-10-CM | POA: Diagnosis not present

## 2014-09-16 MED ORDER — PREDNISONE 20 MG PO TABS: ORAL_TABLET | ORAL | Status: DC

## 2014-09-17 ENCOUNTER — Encounter: Payer: Self-pay | Admitting: Nurse Practitioner

## 2014-09-17 LAB — BASIC METABOLIC PANEL
BUN/Creatinine Ratio: 12 (ref 8–19)
BUN: 13 mg/dL (ref 6–20)
CO2: 25 mmol/L (ref 18–29)
Calcium: 9.8 mg/dL (ref 8.7–10.2)
Chloride: 97 mmol/L (ref 97–108)
Creatinine, Ser: 1.05 mg/dL (ref 0.76–1.27)
GFR calc Af Amer: 104 mL/min/{1.73_m2} (ref >59)
GFR calc non Af Amer: 90 mL/min/{1.73_m2} (ref >59)
GLUCOSE: 106 mg/dL — AB (ref 65–99)
POTASSIUM: 4.3 mmol/L (ref 3.5–5.2)
SODIUM: 141 mmol/L (ref 134–144)

## 2014-09-17 LAB — URIC ACID: Uric Acid: 10 mg/dL — ABNORMAL HIGH (ref 3.7–8.6)

## 2014-09-17 LAB — SEDIMENTATION RATE: SED RATE: 15 mm/h (ref 0–15)

## 2014-09-17 NOTE — Progress Notes (Signed)
Subjective:  Presents with complaints of right ankle and foot pain off-and-on for the past 3-for years. Has taken steroids periodically which has resolved his symptoms. No specific history of injury. Having swelling in the right ankle for the past 2 weeks, worsening symptoms over the past 6 days. Usually improved when he wears his work boots with extra cushion and support, now has trouble with any weightbearing. Using crutches today. Describes the ankle is feeling locked up with limited movement. No fever. No other joint involvement.  Objective:   BP 150/94 mmHg  Ht  (1.88 m)  Wt  NAD. Alert, oriented. Lungs clear. Heart regular rate rhythm. Significant edema noted around lateral malleolus less so on the medial side. Distinct tenderness noted bilaterally more on the right. Minimal erythema on the right. No warmth. Very limited ROM of the ankle due to tenderness.  Assessment: Ankle pain, right - Plan: DG Ankle Complete Right, Uric acid, Sedimentation rate  Arthralgia - Plan: DG Ankle Complete Right, Uric acid, Sedimentation rate  High risk medication use - Plan: Basic metabolic panel  Plan:  Meds ordered this encounter  Medications  . predniSONE (DELTASONE) 20 MG tablet    Sig: 3 po qd x 3 d then 2 po qd x 3 d then 1 po qd x 3 d    Dispense:  18 tablet    Refill:  0    Order Specific Question:  Supervising Provider    Answer:  Merlyn Albert [[id number]]   Ankle x-ray and labs for gout pending. Start prednisone as directed. Further follow-up based on test results. Work note given.

## 2014-10-28 ENCOUNTER — Ambulatory Visit: Payer: 59 | Admitting: Family Medicine

## 2014-10-29 ENCOUNTER — Ambulatory Visit (INDEPENDENT_AMBULATORY_CARE_PROVIDER_SITE_OTHER): Payer: 59 | Admitting: Family Medicine

## 2014-10-29 ENCOUNTER — Encounter: Payer: Self-pay | Admitting: Family Medicine

## 2014-10-29 VITALS — BP 140/90 | Temp 98.4°F | Ht 74.0 in | Wt 254.0 lb

## 2014-10-29 DIAGNOSIS — J310 Chronic rhinitis: Secondary | ICD-10-CM

## 2014-10-29 DIAGNOSIS — R21 Rash and other nonspecific skin eruption: Secondary | ICD-10-CM

## 2014-10-29 DIAGNOSIS — J329 Chronic sinusitis, unspecified: Secondary | ICD-10-CM | POA: Diagnosis not present

## 2014-10-29 DIAGNOSIS — I10 Essential (primary) hypertension: Secondary | ICD-10-CM | POA: Diagnosis not present

## 2014-10-29 DIAGNOSIS — M1000 Idiopathic gout, unspecified site: Secondary | ICD-10-CM

## 2014-10-29 MED ORDER — NEOMYCIN-POLYMYXIN-HC 3.5-10000-1 OT SOLN: OTIC | Status: DC

## 2014-10-29 MED ORDER — PREDNISONE 20 MG PO TABS: ORAL_TABLET | ORAL | Status: DC

## 2014-10-29 MED ORDER — LISINOPRIL 10 MG PO TABS: 10.0000 mg | ORAL_TABLET | Freq: Every day | ORAL | Status: DC

## 2014-10-29 NOTE — Progress Notes (Signed)
Subjective:    Patient ID: Ryan Gibson, male    DOB: February 15, 1976, 39 y.o.   MRN: [id number] Patient arrives office with 4 distinct problems Otalgia  There is pain in the left ear. This is a new problem. The current episode started more than 1 month ago. The problem has been unchanged. There has been no fever. The pain is mild. Associated symptoms include a rash. Treatments tried: alcohol, vinegar. The treatment provided no relief.  Rash This is a new problem. The current episode started in the past 7 days. The problem is unchanged. The affected locations include the left arm. The rash is characterized by redness and itchiness. He was exposed to nothing. Past treatments include nothing. The treatment provided no relief.  Wrist Pain  The pain is present in the right wrist. This is a new problem. The current episode started in the past 7 days. The problem occurs constantly. The problem has been unchanged. The quality of the pain is described as aching. The pain is moderate. Associated symptoms include joint locking and a limited range of motion. The symptoms are aggravated by activity. He has tried NSAIDS for the symptoms. The treatment provided mild relief.    wks ago with the ear pain, tried peroxida and vinegar in the ear with irritation. Soreness comes and goes, adds to it,  Woke up leaning on the srist, felt tender, and did not want to close prior note reveals workup showed very high uric acid and presence of probable gout  Rash is acting up for the past few weeks, itchy pruritic, flares up with heat. Irritated at times  Patient has high blood pressure specifically hypertension. He was prescribed medication. Patient never got around to getting it. No headache no chest pain  Review of Systems  HENT: Positive for ear pain.   Skin: Positive for rash.   no change in bowel habits no blood in stool     Objective:   Physical Exam  Alert vitals stable blood pressure 146/98 on repeat HEENT  normal right wrist swollen tender ankle with flexion. Frontal maxillary tenderness noted left TM some retraction lungs clear heart regular in rhythm left arm hyperemic rash      Assessment & Plan:  Impression 1 acute wrist arthritis likely gout discussed #2 hypertension with noncompliance need to get on with medication or 3 rhinosinusitis with otitis discussed #4 contact dermatitis discussed plan prednisone. Initiate blood pressure medicine. Importance of compliance discussed. Antibiotics prescribed. Symptom care discussed warning signs discussed WSL

## 2014-11-01 ENCOUNTER — Encounter: Payer: Self-pay | Admitting: Family Medicine

## 2014-11-01 DIAGNOSIS — M109 Gout, unspecified: Secondary | ICD-10-CM | POA: Insufficient documentation

## 2015-03-26 ENCOUNTER — Telehealth: Payer: Self-pay | Admitting: Family Medicine

## 2015-03-26 NOTE — Telephone Encounter (Signed)
Patient is having a flare up of gout on the bottom ride side of his ankle again.  He was seen for this 09/16/2014 and was told if he had anymore issues to call in and we would prescribe something.   [facility] [address]

## 2015-03-27 ENCOUNTER — Other Ambulatory Visit: Payer: Self-pay | Admitting: *Deleted

## 2015-03-27 MED ORDER — PREDNISONE 20 MG PO TABS: ORAL_TABLET | ORAL | Status: DC

## 2015-03-27 NOTE — Telephone Encounter (Signed)
Adult pred taper 

## 2015-03-27 NOTE — Telephone Encounter (Signed)
Med sent to pharm. Pt notified.  

## 2015-05-22 ENCOUNTER — Encounter: Payer: Self-pay | Admitting: Family Medicine

## 2015-05-22 ENCOUNTER — Ambulatory Visit (INDEPENDENT_AMBULATORY_CARE_PROVIDER_SITE_OTHER): Payer: 59 | Admitting: Family Medicine

## 2015-05-22 VITALS — BP 148/98 | Ht 74.0 in | Wt 268.0 lb

## 2015-05-22 DIAGNOSIS — I10 Essential (primary) hypertension: Secondary | ICD-10-CM | POA: Diagnosis not present

## 2015-05-22 DIAGNOSIS — M7022 Olecranon bursitis, left elbow: Secondary | ICD-10-CM | POA: Diagnosis not present

## 2015-05-22 MED ORDER — LISINOPRIL 10 MG PO TABS: 10.0000 mg | ORAL_TABLET | Freq: Every day | ORAL | Status: DC

## 2015-05-22 MED ORDER — AMOXICILLIN-POT CLAVULANATE 875-125 MG PO TABS: 1.0000 | ORAL_TABLET | Freq: Two times a day (BID) | ORAL | Status: DC

## 2015-05-22 NOTE — Progress Notes (Signed)
Subjective:    Patient ID: Ryan Gibson, male    DOB: Jul 03, 1975, 40 y.o.   MRN: [id number]  HPIleft elbow swelling. No pain. Started 2-3 weeks ago. May have bumped it does not remember  Left knee swelling. Heard a pop in knee. Started 4 days ago. Better now. Was taking aleve.   Patient also here to follow-up on hypertension. Compliant with medications. Has not miss a dose. Exercising fairly rectally. Watching salt intake. No obvious side effects from the medication.  Review of Systems No headache, no major weight loss or weight gain, no chest pain no back pain abdominal pain no change in bowel habits complete ROS otherwise negative     Objective:   Physical Exam Alert lungs clear heart rare rhythm H&T normal blood pressure excellent on repeat  Left elbow large swelling over all a creatinine bursa fluctuant next  Patient was prepped draped anesthetized fluid was withdrawn serous in nature, steroids injected along with Xylocaine       Assessment & Plan:  Impression hypertension overall good control discussed maintain same meds #20 cream and bursitis injected due to large nature along with element of tenderness mild erythema and tenderness plan antibiotics prescribed. Blood pressure meds refilled. Diet exercise discussed check every 6 months

## 2015-06-11 ENCOUNTER — Ambulatory Visit (HOSPITAL_COMMUNITY)
Admission: RE | Admit: 2015-06-11 | Discharge: 2015-06-11 | Disposition: A | Discharge status: Home / Self Care | Payer: 59 | Source: Ambulatory Visit | Attending: Family Medicine | Admitting: Family Medicine

## 2015-06-11 ENCOUNTER — Other Ambulatory Visit: Payer: Self-pay

## 2015-06-11 ENCOUNTER — Ambulatory Visit (INDEPENDENT_AMBULATORY_CARE_PROVIDER_SITE_OTHER): Payer: 59 | Admitting: Family Medicine

## 2015-06-11 ENCOUNTER — Encounter: Payer: Self-pay | Admitting: Family Medicine

## 2015-06-11 VITALS — BP 150/98 | Temp 98.3°F | Ht 74.0 in | Wt 268.0 lb

## 2015-06-11 DIAGNOSIS — M7989 Other specified soft tissue disorders: Secondary | ICD-10-CM | POA: Insufficient documentation

## 2015-06-11 DIAGNOSIS — M79642 Pain in left hand: Secondary | ICD-10-CM | POA: Insufficient documentation

## 2015-06-11 DIAGNOSIS — M1990 Unspecified osteoarthritis, unspecified site: Secondary | ICD-10-CM

## 2015-06-11 DIAGNOSIS — M19032 Primary osteoarthritis, left wrist: Secondary | ICD-10-CM

## 2015-06-11 MED ORDER — PREDNISONE 20 MG PO TABS: ORAL_TABLET | ORAL | Status: DC

## 2015-06-11 MED ORDER — DOXYCYCLINE HYCLATE 100 MG PO TABS: 100.0000 mg | ORAL_TABLET | Freq: Two times a day (BID) | ORAL | Status: DC

## 2015-06-11 NOTE — Progress Notes (Signed)
Subjective:    Patient ID: Ryan Gibson, male    DOB: 02-Jan-1976, 40 y.o.   MRN: [id number]  Hand Pain  Incident onset: 5 -6 days ago. There was no injury mechanism. The pain is present in the left hand. He has tried ice for the symptoms.    patient felt a popping sensation in his wrist when lifting a child. Developed subsequent pain and swelling.  Prior history substantial for positive gout. No fever chills   Review of Systems  no headache no chest pain no back pain    Objective:   Physical Exam   alert vital stable lungs clear heart rhythm left wrist warm tender distinct we swollen erythematous some limitation of motion      Assessment & Plan:   impression acute arthritis of wrist. With overlying erythema gout is at the top of the list. Cannot rule out cutaneous infection , highly doubt septic arthritis plan prednisone taper antibiotics prescribed symptom care discussed warning signs discussed WSL wear short wrist splint

## 2015-06-14 MED ORDER — METHYLPREDNISOLONE ACETATE 40 MG/ML IJ SUSP: 40.0000 mg | Freq: Once | INTRAMUSCULAR | Status: DC

## 2015-08-04 ENCOUNTER — Ambulatory Visit: Payer: 59 | Admitting: Family Medicine

## 2015-10-07 ENCOUNTER — Ambulatory Visit: Payer: 59 | Admitting: Family Medicine

## 2015-10-07 ENCOUNTER — Encounter: Payer: Self-pay | Admitting: Family Medicine

## 2015-10-27 ENCOUNTER — Encounter: Payer: Self-pay | Admitting: Family Medicine

## 2015-10-27 ENCOUNTER — Ambulatory Visit (INDEPENDENT_AMBULATORY_CARE_PROVIDER_SITE_OTHER): Payer: 59 | Admitting: Family Medicine

## 2015-10-27 VITALS — BP 140/92 | Ht 74.0 in | Wt 270.0 lb

## 2015-10-27 DIAGNOSIS — Z13220 Encounter for screening for lipoid disorders: Secondary | ICD-10-CM

## 2015-10-27 DIAGNOSIS — Z79899 Other long term (current) drug therapy: Secondary | ICD-10-CM | POA: Diagnosis not present

## 2015-10-27 DIAGNOSIS — I10 Essential (primary) hypertension: Secondary | ICD-10-CM

## 2015-10-27 DIAGNOSIS — R5383 Other fatigue: Secondary | ICD-10-CM

## 2015-10-27 DIAGNOSIS — M109 Gout, unspecified: Secondary | ICD-10-CM

## 2015-10-27 MED ORDER — PREDNISONE 20 MG PO TABS: ORAL_TABLET | ORAL | 0 refills | Status: DC

## 2015-10-27 NOTE — Progress Notes (Signed)
Subjective:    Patient ID: Ryan Gibson, male    DOB: 15-Mar-1975, 40 y.o.   MRN: [id number]  Knee Pain   There was no injury mechanism. The pain is present in the left knee. The symptoms are aggravated by weight bearing. Treatments tried: aleve, ice, elevation.   Pt wondered if it wa just a mild strain, developed major swelling  Woke up with it locked  u  Trouble putting weight on it,   Patient recalls no sudden injury. Positive history of gout. States it is quite painful with certain pressure. At times gives a sense of giving away. No injury before hand   Review of Systems No headache, no major weight loss or weight gain, no chest pain no back pain abdominal pain no change in bowel habits complete ROS otherwise negative     Objective:   Physical Exam Alert vitals stable, NAD. Blood pressure good on repeat. HEENT normal. Lungs clear. Heart regular rate and rhythm. Left knee substantial effusion positive pain with flexion and extension obvious joint laxity       Assessment & Plan:  Impression acute left knee arthritis in this patient likely gout discussed at length plan prednisone taper. Appropriate blood work. Patient to follow-up with chronic concerns and physical in several weeks WSL

## 2015-10-28 ENCOUNTER — Telehealth: Payer: Self-pay | Admitting: Family Medicine

## 2015-10-28 ENCOUNTER — Encounter: Payer: Self-pay | Admitting: Family Medicine

## 2015-10-28 NOTE — Telephone Encounter (Signed)
Notified patient.

## 2015-10-28 NOTE — Telephone Encounter (Signed)
Pt is requesting a work note for 9/2 through 9/9 due to his knee pain. Please advise.

## 2015-10-28 NOTE — Telephone Encounter (Signed)
ok 

## 2015-10-28 NOTE — Telephone Encounter (Signed)
Seen yesterday for knee pain

## 2015-10-30 ENCOUNTER — Telehealth: Payer: Self-pay | Admitting: Family Medicine

## 2015-10-30 NOTE — Telephone Encounter (Signed)
Seen 9/5 for knee pain

## 2015-10-30 NOTE — Telephone Encounter (Signed)
Discussed with pt. Pt verbalized understanding.  °

## 2015-10-30 NOTE — Telephone Encounter (Signed)
Left message to return call 

## 2015-10-30 NOTE — Telephone Encounter (Signed)
Patient wanting something for the swelling.He has been elevating and icing his knee but still a little swollen and painful to walk on.He wants to try to go to work on 9/10.Can you call into [facility] [place].

## 2015-10-30 NOTE — Telephone Encounter (Signed)
The prednisone that he was put on should help with the knee medication take some time for the swelling to go down. He may use a short course of ibuprofen 200 mg tablet take 3 tablets every 8 hours as needed. I would take it with a snack. If it causes stomach pain I would stop it. I would use medication for maybe 3-5 days. If ongoing troubles call back in follow-up

## 2015-10-31 LAB — BASIC METABOLIC PANEL
BUN / CREAT RATIO: 19 (ref 9–20)
BUN: 17 mg/dL (ref 6–20)
CO2: 24 mmol/L (ref 18–29)
CREATININE: 0.91 mg/dL (ref 0.76–1.27)
Calcium: 10 mg/dL (ref 8.7–10.2)
Chloride: 96 mmol/L (ref 96–106)
GFR, EST AFRICAN AMERICAN: 122 mL/min/{1.73_m2} (ref >59)
GFR, EST NON AFRICAN AMERICAN: 106 mL/min/{1.73_m2} (ref >59)
Glucose: 117 mg/dL — ABNORMAL HIGH (ref 65–99)
Potassium: 4.6 mmol/L (ref 3.5–5.2)
SODIUM: 141 mmol/L (ref 134–144)

## 2015-10-31 LAB — HEPATIC FUNCTION PANEL
ALBUMIN: 4.8 g/dL (ref 3.5–5.5)
ALK PHOS: 67 IU/L (ref 39–117)
ALT: 37 IU/L (ref 0–44)
AST: 23 IU/L (ref 0–40)
BILIRUBIN, DIRECT: 0.13 mg/dL (ref 0.00–0.40)
Bilirubin Total: 0.4 mg/dL (ref 0.0–1.2)
TOTAL PROTEIN: 8.1 g/dL (ref 6.0–8.5)

## 2015-10-31 LAB — LIPID PANEL
CHOL/HDL RATIO: 4.7 ratio (ref 0.0–5.0)
Cholesterol, Total: 241 mg/dL — ABNORMAL HIGH (ref 100–199)
HDL: 51 mg/dL (ref >39)
LDL CALC: 125 mg/dL — AB (ref 0–99)
Triglycerides: 327 mg/dL — ABNORMAL HIGH (ref 0–149)
VLDL Cholesterol Cal: 65 mg/dL — ABNORMAL HIGH (ref 5–40)

## 2015-10-31 LAB — URIC ACID: Uric Acid: 10.3 mg/dL — ABNORMAL HIGH (ref 3.7–8.6)

## 2015-10-31 LAB — TESTOSTERONE: TESTOSTERONE: 129 ng/dL — AB (ref 264–916)

## 2015-11-18 ENCOUNTER — Ambulatory Visit: Payer: 59 | Admitting: Family Medicine

## 2015-11-24 ENCOUNTER — Ambulatory Visit (INDEPENDENT_AMBULATORY_CARE_PROVIDER_SITE_OTHER): Payer: 59 | Admitting: Family Medicine

## 2015-11-24 ENCOUNTER — Encounter: Payer: Self-pay | Admitting: Family Medicine

## 2015-11-24 VITALS — BP 132/90 | Ht 74.0 in | Wt 272.5 lb

## 2015-11-24 DIAGNOSIS — E7800 Pure hypercholesterolemia, unspecified: Secondary | ICD-10-CM | POA: Diagnosis not present

## 2015-11-24 DIAGNOSIS — M109 Gout, unspecified: Secondary | ICD-10-CM

## 2015-11-24 DIAGNOSIS — E291 Testicular hypofunction: Secondary | ICD-10-CM

## 2015-11-24 DIAGNOSIS — I10 Essential (primary) hypertension: Secondary | ICD-10-CM

## 2015-11-24 DIAGNOSIS — Z79899 Other long term (current) drug therapy: Secondary | ICD-10-CM

## 2015-11-24 MED ORDER — ALLOPURINOL 300 MG PO TABS: 300.0000 mg | ORAL_TABLET | Freq: Every day | ORAL | 11 refills | Status: DC

## 2015-11-24 MED ORDER — LISINOPRIL 10 MG PO TABS: 10.0000 mg | ORAL_TABLET | Freq: Every day | ORAL | 5 refills | Status: DC

## 2015-11-24 MED ORDER — TESTOSTERONE CYPIONATE 200 MG/ML IM SOLN: 200.0000 mg | INTRAMUSCULAR | 5 refills | Status: DC

## 2015-11-24 NOTE — Progress Notes (Signed)
Subjective:    Patient ID: Ryan Gibson, male    DOB: 1975-12-28, 40 y.o.   MRN: [id number]  Hypertension  This is a chronic problem. The current episode started more than 1 year ago. The problem has been gradually improving since onset. There are no associated agents to hypertension. There are no known risk factors for coronary artery disease. Treatments tried: lisinopril. The current treatment provides moderate improvement. There are no compliance problems.    Patient has a rash on his neck and his left forearm. Onset 3-4 days ago. Treatments tried: none.   Results for orders placed or performed in visit on 10/27/15  Hepatic function panel  Result Value Ref Range   Total Protein 8.1 6.0 - 8.5 g/dL   Albumin 4.8 3.5 - 5.5 g/dL   Bilirubin Total 0.4 0.0 - 1.2 mg/dL   Bilirubin, Direct 0.86 0.00 - 0.40 mg/dL   Alkaline Phosphatase 67 39 - 117 IU/L   AST 23 0 - 40 IU/L   ALT 37 0 - 44 IU/L  Basic metabolic panel  Result Value Ref Range   Glucose 117 (H) 65 - 99 mg/dL   BUN 17 6 - 20 mg/dL   Creatinine, Ser 5.78 0.76 - 1.27 mg/dL   GFR calc non Af Amer 106 >59 mL/min/1.73   GFR calc Af Amer 122 >59 mL/min/1.73   BUN/Creatinine Ratio 19 9 - 20   Sodium 141 134 - 144 mmol/L   Potassium 4.6 3.5 - 5.2 mmol/L   Chloride 96 96 - 106 mmol/L   CO2 24 18 - 29 mmol/L   Calcium 10.0 8.7 - 10.2 mg/dL  Lipid panel  Result Value Ref Range   Cholesterol, Total 241 (H) 100 - 199 mg/dL   Triglycerides 469 (H) 0 - 149 mg/dL   HDL 51 >62 mg/dL   VLDL Cholesterol Cal 65 (H) 5 - 40 mg/dL   LDL Calculated 952 (H) 0 - 99 mg/dL   Chol/HDL Ratio 4.7 0.0 - 5.0 ratio units  Uric acid  Result Value Ref Range   Uric Acid 10.3 (H) 3.7 - 8.6 mg/dL  Testosterone  Result Value Ref Range   Testosterone 129 (L) 264 - 916 ng/dL  Patient notes seriously diminished sex drive. No energy. Definitely would like to try testosterone supplement warranted. Does not when he used topical agent. Would like you  shots. 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 Multiple spells of gout for patient. 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   Review of Systems No headache, no major weight loss or weight gain, no chest pain no back pain abdominal pain no change in bowel habits complete ROS otherwise negative     Objective:   Physical Exam  Alert vitals stable patient overweight lungs clear heart regular rate and rhythm knee resolving ankles without edema      Assessment & Plan:  Impression hypertension plus minus compliance #2 hyperlipidemia significant but not super high like patient's father. Was good HDL fairly helps balance things out but still too high #3 gout discussed time the press on preventive agents #4 low testosterone substantial, discussed supplement, advised patient I generally do not supplement with numbers in the low 100s I think it's appropriate plan initiate allopurinol. Compliant blood pressure meds discussed. Initiate testosterone. Diet exercise discussed recheck as scheduled WSL

## 2015-11-24 NOTE — Patient Instructions (Addendum)
Results for orders placed or performed in visit on 10/27/15  Hepatic function panel  Result Value Ref Range   Total Protein 8.1 6.0 - 8.5 g/dL   Albumin 4.8 3.5 - 5.5 g/dL   Bilirubin Total 0.4 0.0 - 1.2 mg/dL   Bilirubin, Direct 7.820.13 0.00 - 0.40 mg/dL   Alkaline Phosphatase 67 39 - 117 IU/L   AST 23 0 - 40 IU/L   ALT 37 0 - 44 IU/L  Basic metabolic panel  Result Value Ref Range   Glucose 117 (H) 65 - 99 mg/dL   BUN 17 6 - 20 mg/dL   Creatinine, Ser 9.560.91 0.76 - 1.27 mg/dL   GFR calc non Af Amer 106 >59 mL/min/1.73   GFR calc Af Amer 122 >59 mL/min/1.73   BUN/Creatinine Ratio 19 9 - 20   Sodium 141 134 - 144 mmol/L   Potassium 4.6 3.5 - 5.2 mmol/L   Chloride 96 96 - 106 mmol/L   CO2 24 18 - 29 mmol/L   Calcium 10.0 8.7 - 10.2 mg/dL  Lipid panel  Result Value Ref Range   Cholesterol, Total 241 (H) 100 - 199 mg/dL   Triglycerides 213327 (H) 0 - 149 mg/dL   HDL 51 >08>39 mg/dL   VLDL Cholesterol Cal 65 (H) 5 - 40 mg/dL   LDL Calculated 657125 (H) 0 - 99 mg/dL   Chol/HDL Ratio 4.7 0.0 - 5.0 ratio units  Uric acid  Result Value Ref Range   Uric Acid 10.3 (H) 3.7 - 8.6 mg/dL  Testosterone  Result Value Ref Range   Testosterone 129 (L) 264 - 916 ng/dL    Cholesterol Cholesterol is a white, waxy, fat-like substance needed by your body in small amounts. The liver makes all the cholesterol you need. Cholesterol is carried from the liver by the blood through the blood vessels. Deposits of cholesterol (plaque) may build up on blood vessel walls. These make the arteries narrower and stiffer. Cholesterol plaques increase the risk for heart attack and stroke.  You cannot feel your cholesterol level even if it is very high. The only way to know it is high is with a blood test. Once you know your cholesterol levels, you should keep a record of the test results. Work with your health care provider to keep your levels in the desired range.  WHAT DO THE RESULTS MEAN?  Total cholesterol is a rough  measure of all the cholesterol in your blood.   LDL is the so-called bad cholesterol. This is the type that deposits cholesterol in the walls of the arteries. You want this level to be low.   HDL is the good cholesterol because it cleans the arteries and carries the LDL away. You want this level to be high.  Triglycerides are fat that the body can either burn for energy or store. High levels are closely linked to heart disease.  WHAT ARE THE DESIRED LEVELS OF CHOLESTEROL?  Total cholesterol below 200.   LDL below 100 for people at risk, below 70 for those at very high risk.   HDL above 50 is good, above 60 is best.   Triglycerides below 150.  HOW CAN I LOWER MY CHOLESTEROL?  Diet. Follow your diet programs as directed by your health care provider.   Choose fish or white meat chicken and [place], roasted or baked. Limit fatty cuts of red meat, fried foods, and processed meats, such as sausage and lunch meats.   Eat lots of fresh fruits and  vegetables.  Choose whole grains, beans, pasta, potatoes, and cereals.   Use only small amounts of olive, corn, or canola oils.   Avoid butter, mayonnaise, shortening, or palm kernel oils.  Avoid foods with trans fats.   Drink skim or nonfat milk and eat low-fat or nonfat yogurt and cheeses. Avoid whole milk, cream, ice cream, egg yolks, and full-fat cheeses.   Healthy desserts include angel food cake, ginger snaps, animal crackers, hard candy, popsicles, and low-fat or nonfat frozen yogurt. Avoid pastries, cakes, pies, and cookies.   Exercise. Follow your exercise programs as directed by your health care provider.   A regular program helps decrease LDL and raise HDL.   A regular program helps with weight control.   Do things that increase your activity level like gardening, walking, or taking the stairs. Ask your health care provider about how you can be more active in your daily life.   Medicine. Take medicine only as  directed by your health care provider.   Medicine may be prescribed by your health care provider to help lower cholesterol and decrease the risk for heart disease.   If you have several risk factors, you may need medicine even if your levels are normal.   This information is not intended to replace advice given to you by your health care provider. Make sure you discuss any questions you have with your health care provider.   Document Released: 11/02/2000 Document Revised: 02/28/2014 Document Reviewed: 11/21/2012 Elsevier Interactive Patient Education [employer]

## 2015-11-25 ENCOUNTER — Telehealth: Payer: Self-pay | Admitting: *Deleted

## 2015-11-25 DIAGNOSIS — E291 Testicular hypofunction: Secondary | ICD-10-CM

## 2015-11-25 NOTE — Telephone Encounter (Signed)
Tell pt need another testosterone level per his insurance morning level, and they may still reject the shots, they may require patches

## 2015-11-25 NOTE — Telephone Encounter (Signed)
Left message to return call (blood work ordered in EP[facility]) 

## 2015-11-25 NOTE — Telephone Encounter (Signed)
Discussed with pt. Pt verbalized understanding. Orders for bloodwork put in.

## 2015-11-25 NOTE — Telephone Encounter (Signed)
Prior authorization required for testosterone injections. Spoke with prior authorization technician with patient's insurance [phone]) who stated the request is denied because patient has not has 2 testosterone blood levels showing low testosterone.  Insurance states this is the first requirement before they can initiate further review for any supplemental testosterone either topical or injection.

## 2015-11-27 LAB — TESTOSTERONE: TESTOSTERONE: 179 ng/dL — AB (ref 264–916)

## 2015-11-30 ENCOUNTER — Telehealth: Payer: Self-pay | Admitting: Family Medicine

## 2015-11-30 ENCOUNTER — Telehealth: Payer: Self-pay | Admitting: *Deleted

## 2015-11-30 NOTE — Telephone Encounter (Signed)
Prior authorization was obtained and given to pharmacy. Patient notified.

## 2015-11-30 NOTE — Telephone Encounter (Signed)
Prior authorization for testosterone cypionate 200mg /ml obtained. Authorization good from 11/30/15-11/29/16. Pharmacy notified.

## 2015-11-30 NOTE — Telephone Encounter (Signed)
Calling to check on results to recent labwork.  Also, calling to see if we have sent the results to his insurance company yet.

## 2015-12-02 ENCOUNTER — Telehealth: Payer: Self-pay | Admitting: Family Medicine

## 2015-12-02 NOTE — Telephone Encounter (Signed)
Received fax from [facility] stating that Prior Authorization for Testosterone Cypionate vial has been approved. Effective 10/26/2015- 11/29/2016.

## 2015-12-13 ENCOUNTER — Encounter: Payer: Self-pay | Admitting: Family Medicine

## 2015-12-15 ENCOUNTER — Telehealth: Payer: Self-pay | Admitting: Family Medicine

## 2015-12-15 MED ORDER — PREDNISONE 20 MG PO TABS: ORAL_TABLET | ORAL | 0 refills | Status: DC

## 2015-12-15 NOTE — Telephone Encounter (Signed)
Pt called stating that his ankles are swollen and can barely walk on them. Pt states that he has been taking the allopurinol (ZYLOPRIM) 300 MG tablet For the gout, but is wanting to know if he can have a prednisone pack called in. Please advise.    [facility]

## 2015-12-15 NOTE — Telephone Encounter (Signed)
Prescription sent electronically to pharmacy. Patient notified. 

## 2015-12-15 NOTE — Telephone Encounter (Signed)
Adult pred taper 

## 2015-12-28 ENCOUNTER — Telehealth: Payer: Self-pay | Admitting: Family Medicine

## 2015-12-28 ENCOUNTER — Ambulatory Visit (INDEPENDENT_AMBULATORY_CARE_PROVIDER_SITE_OTHER): Payer: 59 | Admitting: Family Medicine

## 2015-12-28 ENCOUNTER — Encounter: Payer: Self-pay | Admitting: Family Medicine

## 2015-12-28 VITALS — BP 138/90 | Ht 74.0 in | Wt 272.0 lb

## 2015-12-28 DIAGNOSIS — M25562 Pain in left knee: Secondary | ICD-10-CM

## 2015-12-28 DIAGNOSIS — R030 Elevated blood-pressure reading, without diagnosis of hypertension: Secondary | ICD-10-CM

## 2015-12-28 MED ORDER — HYDROCODONE-ACETAMINOPHEN 10-325 MG PO TABS: ORAL_TABLET | ORAL | 0 refills | Status: DC

## 2015-12-28 NOTE — Telephone Encounter (Signed)
Pt called stating that when he seen Dr. Lorin PicketScott today he didn't think he would need anything for pain, but states since he has been home it has swollen up worse and is painful. Pt states that he would like something for pain to be called in.   [facility] [place]

## 2015-12-28 NOTE — Telephone Encounter (Signed)
Patient on his way to pickup script for hydrocodone

## 2015-12-28 NOTE — Patient Instructions (Signed)
Hypertension Hypertension, commonly called high blood pressure, is when the force of blood pumping through your arteries is too strong. Your arteries are the blood vessels that carry blood from your heart throughout your body. A blood pressure reading consists of a higher number over a lower number, such as 110/72. The higher number (systolic) is the pressure inside your arteries when your heart pumps. The lower number (diastolic) is the pressure inside your arteries when your heart relaxes. Ideally you want your blood pressure below 120/80. Hypertension forces your heart to work harder to pump blood. Your arteries may become narrow or stiff. Having untreated or uncontrolled hypertension can cause heart attack, stroke, kidney disease, and other problems. RISK FACTORS Some risk factors for high blood pressure are controllable. Others are not.  Risk factors you cannot control include:   Race. You may be at higher risk if you are African American.  Age. Risk increases with age.  Gender. Men are at higher risk than women before age 45 years. After age 65, women are at higher risk than men. Risk factors you can control include:  Not getting enough exercise or physical activity.  Being overweight.  Getting too much fat, sugar, calories, or salt in your diet.  Drinking too much alcohol. SIGNS AND SYMPTOMS Hypertension does not usually cause signs or symptoms. Extremely high blood pressure (hypertensive crisis) may cause headache, anxiety, shortness of breath, and nosebleed. DIAGNOSIS To check if you have hypertension, your health care provider will measure your blood pressure while you are seated, with your arm held at the level of your heart. It should be measured at least twice using the same arm. Certain conditions can cause a difference in blood pressure between your right and left arms. A blood pressure reading that is higher than normal on one occasion does not mean that you need treatment. If  it is not clear whether you have high blood pressure, you may be asked to return on a different day to have your blood pressure checked again. Or, you may be asked to monitor your blood pressure at home for 1 or more weeks. TREATMENT Treating high blood pressure includes making lifestyle changes and possibly taking medicine. Living a healthy lifestyle can help lower high blood pressure. You may need to change some of your habits. Lifestyle changes may include:  Following the DASH diet. This diet is high in fruits, vegetables, and whole grains. It is low in salt, red meat, and added sugars.  Keep your sodium intake below 2,300 mg per day.  Getting at least 30-45 minutes of aerobic exercise at least 4 times per week.  Losing weight if necessary.  Not smoking.  Limiting alcoholic beverages.  Learning ways to reduce stress. Your health care provider may prescribe medicine if lifestyle changes are not enough to get your blood pressure under control, and if one of the following is true:  You are 18-59 years of age and your systolic blood pressure is above 140.  You are 60 years of age or older, and your systolic blood pressure is above 150.  Your diastolic blood pressure is above 90.  You have diabetes, and your systolic blood pressure is over 140 or your diastolic blood pressure is over 90.  You have kidney disease and your blood pressure is above 140/90.  You have heart disease and your blood pressure is above 140/90. Your personal target blood pressure may vary depending on your medical conditions, your age, and other factors. HOME CARE INSTRUCTIONS    Have your blood pressure rechecked as directed by your health care provider.   Take medicines only as directed by your health care provider. Follow the directions carefully. Blood pressure medicines must be taken as prescribed. The medicine does not work as well when you skip doses. Skipping doses also puts you at risk for  problems.  Do not smoke.   Monitor your blood pressure at home as directed by your health care provider. SEEK MEDICAL CARE IF:   You think you are having a reaction to medicines taken.  You have recurrent headaches or feel dizzy.  You have swelling in your ankles.  You have trouble with your vision. SEEK IMMEDIATE MEDICAL CARE IF:  You develop a severe headache or confusion.  You have unusual weakness, numbness, or feel faint.  You have severe chest or abdominal pain.  You vomit repeatedly.  You have trouble breathing. MAKE SURE YOU:   Understand these instructions.  Will watch your condition.  Will get help right away if you are not doing well or get worse.   This information is not intended to replace advice given to you by your health care provider. Make sure you discuss any questions you have with your health care provider.   Document Released: 02/07/2005 Document Revised: 06/24/2014 Document Reviewed: 11/30/2012 Elsevier Interactive Patient Education 2016 Elsevier Inc. DASH Eating Plan DASH stands for "Dietary Approaches to Stop Hypertension." The DASH eating plan is a healthy eating plan that has been shown to reduce high blood pressure (hypertension). Additional health benefits may include reducing the risk of type 2 diabetes mellitus, heart disease, and stroke. The DASH eating plan may also help with weight loss. WHAT DO I NEED TO KNOW ABOUT THE DASH EATING PLAN? For the DASH eating plan, you will follow these general guidelines:  Choose foods with a percent daily value for sodium of less than 5% (as listed on the food label).  Use salt-free seasonings or herbs instead of table salt or sea salt.  Check with your health care provider or pharmacist before using salt substitutes.  Eat lower-sodium products, often labeled as "lower sodium" or "no salt added."  Eat fresh foods.  Eat more vegetables, fruits, and low-fat dairy products.  Choose whole grains.  Look for the word "whole" as the first word in the ingredient list.  Choose fish and skinless chicken or turkey more often than red meat. Limit fish, poultry, and meat to 6 oz (170 g) each day.  Limit sweets, desserts, sugars, and sugary drinks.  Choose heart-healthy fats.  Limit cheese to 1 oz (28 g) per day.  Eat more home-cooked food and less restaurant, buffet, and fast food.  Limit fried foods.  Cook foods using methods other than frying.  Limit canned vegetables. If you do use them, rinse them well to decrease the sodium.  When eating at a restaurant, ask that your food be prepared with less salt, or no salt if possible. WHAT FOODS CAN I EAT? Seek help from a dietitian for individual calorie needs. Grains Whole grain or whole wheat bread. Brown rice. Whole grain or whole wheat pasta. Quinoa, bulgur, and whole grain cereals. Low-sodium cereals. Corn or whole wheat flour tortillas. Whole grain cornbread. Whole grain crackers. Low-sodium crackers. Vegetables Fresh or frozen vegetables (raw, steamed, roasted, or grilled). Low-sodium or reduced-sodium tomato and vegetable juices. Low-sodium or reduced-sodium tomato sauce and paste. Low-sodium or reduced-sodium canned vegetables.  Fruits All fresh, canned (in natural juice), or frozen fruits. Meat and Other   Protein Products Ground beef (85% or leaner), grass-fed beef, or beef trimmed of fat. Skinless chicken or turkey. Ground chicken or turkey. Pork trimmed of fat. All fish and seafood. Eggs. Dried beans, peas, or lentils. Unsalted nuts and seeds. Unsalted canned beans. Dairy Low-fat dairy products, such as skim or 1% milk, 2% or reduced-fat cheeses, low-fat ricotta or cottage cheese, or plain low-fat yogurt. Low-sodium or reduced-sodium cheeses. Fats and Oils Tub margarines without trans fats. Light or reduced-fat mayonnaise and salad dressings (reduced sodium). Avocado. Safflower, olive, or canola oils. Natural peanut or almond  butter. Other Unsalted popcorn and pretzels. The items listed above may not be a complete list of recommended foods or beverages. Contact your dietitian for more options. WHAT FOODS ARE NOT RECOMMENDED? Grains White bread. White pasta. White rice. Refined cornbread. Bagels and croissants. Crackers that contain trans fat. Vegetables Creamed or fried vegetables. Vegetables in a cheese sauce. Regular canned vegetables. Regular canned tomato sauce and paste. Regular tomato and vegetable juices. Fruits Dried fruits. Canned fruit in light or heavy syrup. Fruit juice. Meat and Other Protein Products Fatty cuts of meat. Ribs, chicken wings, bacon, sausage, bologna, salami, chitterlings, fatback, hot dogs, bratwurst, and packaged luncheon meats. Salted nuts and seeds. Canned beans with salt. Dairy Whole or 2% milk, cream, half-and-half, and cream cheese. Whole-fat or sweetened yogurt. Full-fat cheeses or blue cheese. Nondairy creamers and whipped toppings. Processed cheese, cheese spreads, or cheese curds. Condiments Onion and garlic salt, seasoned salt, table salt, and sea salt. Canned and packaged gravies. Worcestershire sauce. Tartar sauce. Barbecue sauce. Teriyaki sauce. Soy sauce, including reduced sodium. Steak sauce. Fish sauce. Oyster sauce. Cocktail sauce. Horseradish. Ketchup and mustard. Meat flavorings and tenderizers. Bouillon cubes. Hot sauce. Tabasco sauce. Marinades. Taco seasonings. Relishes. Fats and Oils Butter, stick margarine, lard, shortening, ghee, and bacon fat. Coconut, palm kernel, or palm oils. Regular salad dressings. Other Pickles and olives. Salted popcorn and pretzels. The items listed above may not be a complete list of foods and beverages to avoid. Contact your dietitian for more information. WHERE CAN I FIND MORE INFORMATION? National Heart, Lung, and Blood Institute: www.nhlbi.nih.gov/health/health-topics/topics/dash/   This information is not intended to replace  advice given to you by your health care provider. Make sure you discuss any questions you have with your health care provider.   Document Released: 01/27/2011 Document Revised: 02/28/2014 Document Reviewed: 12/12/2012 Elsevier Interactive Patient Education 2016 Elsevier Inc.  

## 2015-12-28 NOTE — Telephone Encounter (Signed)
Patient called to check on this message.

## 2015-12-28 NOTE — Telephone Encounter (Signed)
Tramadol can be sent in but if the patient needs something stronger I would recommend hydrocodone 10 mg/325 mg, #24, one every 4 hours when necessary severe pain caution drowsiness but in order for him to get this prescription he would have to come and pick it up.

## 2015-12-28 NOTE — Progress Notes (Signed)
Subjective:    Patient ID: Ryan Gibson, male    DOB: April 10, 1975, 40 y.o.   MRN: [id number]  HPI  Patient arrives with c/o left knee pain.  this patient relates that he did not do anything that he is aware of that causes me to have trouble. He relates left knee pain stiffness and swelling. More on the lateral aspects but also behind the knee and the front part of the knee as well. He states he cannot extend it beyond approximately 130-140. He has difficult time flexing it as well. His never had problems with his knee before. In addition to this patient does have a history of gout and takes allopurinol  Recently he took a prednisone taper for a flareup of gout with his foot. Patient denies any previous injury of his knee. Review of Systems  Constitutional: Negative for fatigue and fever.  HENT: Negative for congestion.   Respiratory: Negative for cough.   Cardiovascular: Negative for chest pain.  Musculoskeletal: Positive for arthralgias.       Objective:   Physical Exam  Constitutional: He appears well-developed.  Neck: Normal range of motion.  Cardiovascular: Normal rate.   Pulmonary/Chest: Effort normal.  Skin: He is not diaphoretic.    On physical exam the ligaments are difficult to test because of swelling he does have swelling in the left knee there is no redness. Patient not running a fever.       Assessment & Plan:   Left knee pain with swelling-could be inflammation from tendinitis or over usage could also be related to gout. I do not feel that this is infection. No x-ray indicated currently. I believe patient would benefit from seeing orthopedics to have the knee aspirated and possible steroid injection. Patient is to continue his gout medicine. Ibuprofen for discomfort for now.  Mild elevation of blood pressure patient was counseled to watch diet exercise try to bring his weight down if blood pressure remains elevated he will need  Increased medication

## 2015-12-29 NOTE — Telephone Encounter (Signed)
error 

## 2016-01-04 ENCOUNTER — Telehealth: Payer: Self-pay | Admitting: Family Medicine

## 2016-01-04 NOTE — Telephone Encounter (Signed)
/  DR Steve's patient thank you

## 2016-01-04 NOTE — Telephone Encounter (Signed)
Patient was seen by Dr. Lorin PicketScott on 11/6 and prescribe cream instead of shot his insurance will pay for shot now . He wants precription called into [facility] for shot.

## 2016-01-07 NOTE — Telephone Encounter (Signed)
Patient seen Dr.Scott for prescription for this but Doctor wants his primary care doctor to address what patient is requesting.

## 2016-01-08 NOTE — Telephone Encounter (Signed)
The 200 mg qtwo weeks is considered a fairly strong dose, rec cst , ref times three

## 2016-01-08 NOTE — Telephone Encounter (Signed)
Spoke with patient and patient stated that his sister will be administering the shot. She is an Charity fundraiserN with [employer]. Also he wanted to know if the dose needs to be up since he had taken a few injections prior to blood work and his testosterone was still low. Please advise?

## 2016-01-08 NOTE — Telephone Encounter (Signed)
Call pt, clarify exactly who will be administering the shot, ? Someone with clinical expreience, tell him if he has someone like that we will call in by tonight

## 2016-01-11 MED ORDER — TESTOSTERONE CYPIONATE 200 MG/ML IM SOLN: 200.0000 mg | INTRAMUSCULAR | 3 refills | Status: DC

## 2016-01-11 NOTE — Addendum Note (Signed)
Addended by: Dereck LigasJOHNSON, CALANDRA P on: 01/11/2016 09:36 AM   Modules accepted: Orders

## 2016-01-11 NOTE — Telephone Encounter (Signed)
Notified patient the 200 mg every two weeks is considered a fairly strong dose, recommend continue same treatment , refill times three. Patient verbalized understanding. Script faxed to pharmacy.

## 2016-04-19 ENCOUNTER — Encounter: Payer: Self-pay | Admitting: Family Medicine

## 2016-04-19 ENCOUNTER — Ambulatory Visit (INDEPENDENT_AMBULATORY_CARE_PROVIDER_SITE_OTHER): Payer: 59 | Admitting: Family Medicine

## 2016-04-19 VITALS — BP 166/110 | Temp 98.5°F | Ht 74.0 in | Wt 283.1 lb

## 2016-04-19 DIAGNOSIS — M109 Gout, unspecified: Secondary | ICD-10-CM | POA: Diagnosis not present

## 2016-04-19 DIAGNOSIS — I10 Essential (primary) hypertension: Secondary | ICD-10-CM | POA: Diagnosis not present

## 2016-04-19 DIAGNOSIS — E291 Testicular hypofunction: Secondary | ICD-10-CM

## 2016-04-19 DIAGNOSIS — Z0289 Encounter for other administrative examinations: Secondary | ICD-10-CM

## 2016-04-19 MED ORDER — TESTOSTERONE CYPIONATE 200 MG/ML IM SOLN: INTRAMUSCULAR | 5 refills | Status: DC

## 2016-04-19 MED ORDER — LISINOPRIL 10 MG PO TABS: 10.0000 mg | ORAL_TABLET | Freq: Every day | ORAL | 5 refills | Status: DC

## 2016-04-19 MED ORDER — ALLOPURINOL 300 MG PO TABS: 300.0000 mg | ORAL_TABLET | Freq: Every day | ORAL | 11 refills | Status: DC

## 2016-04-19 NOTE — Patient Instructions (Signed)
Take over the counter Omeprazole 20 mg a day for gastritis

## 2016-04-19 NOTE — Progress Notes (Signed)
Subjective:    Patient ID: Ryan Gibson, male    DOB: August 26, 1975, 41 y.o.   MRN: [id number] Patient arrives office for numerous concerns. HPI Patient in today for left leg pain. Patient believes he has a flare up of gout.  With original bout of gout, ongoing flares of gout. States medication overall supple. Needing the miss more work at times in his currently scheduled.  On testosterone supplement because of extremely low testosterone levels. Patient notes fatigue tiredness. Also sexual dysfunction. Last blood work did not reveal sufficient elevation of numbers. Patient will like to consider increase of dose imaging discussed with patient.    Patient would like to discuss also discuss FMLA related to gout.   Review of Systems No headache, no major weight loss or weight gain, no chest pain no back pain abdominal pain no change in bowel habits complete ROS otherwise negative     Objective:   Physical Exam  Alert vitals stable, NAD. Blood pressure good on repeat. HEENT normal. Lungs clear. Heart regular rate and rhythm. Left ankle some apparent swelling. Some pain with flexion extension.      Assessment & Plan:  Impression flare of gout #2 chronic intermittent gout #3 fatigue likely multifactorial discussed #4 very low testosterone No. 5 hypertension discussed maintain same meds plan importance of maintaining gout medicine discuss. Prednisone when necessary. Plan increase testosterone dose. Will adjust FMLA to better reflect numerous outages often estimates several events per month.

## 2016-04-20 ENCOUNTER — Telehealth: Payer: Self-pay | Admitting: Family Medicine

## 2016-04-20 NOTE — Telephone Encounter (Signed)
[facility] get it updated. In your folder.

## 2016-05-25 ENCOUNTER — Ambulatory Visit: Payer: 59 | Admitting: Family Medicine

## 2016-07-07 ENCOUNTER — Encounter: Payer: Self-pay | Admitting: Family Medicine

## 2016-07-07 ENCOUNTER — Other Ambulatory Visit (HOSPITAL_COMMUNITY)
Admission: RE | Admit: 2016-07-07 | Discharge: 2016-07-07 | Disposition: A | Discharge status: Home / Self Care | Payer: 59 | Source: Ambulatory Visit | Attending: Family Medicine | Admitting: Family Medicine

## 2016-07-07 ENCOUNTER — Ambulatory Visit (INDEPENDENT_AMBULATORY_CARE_PROVIDER_SITE_OTHER): Payer: 59 | Admitting: Family Medicine

## 2016-07-07 VITALS — Temp 98.2°F | Ht 74.0 in | Wt 283.0 lb

## 2016-07-07 DIAGNOSIS — L03119 Cellulitis of unspecified part of limb: Secondary | ICD-10-CM

## 2016-07-07 LAB — CBC WITH DIFFERENTIAL/PLATELET
Basophils Absolute: 0 10*3/uL (ref 0.0–0.1)
Basophils Relative: 0 %
EOS ABS: 0.1 10*3/uL (ref 0.0–0.7)
EOS PCT: 1 %
HCT: 42.1 % (ref 39.0–52.0)
HEMOGLOBIN: 15.1 g/dL (ref 13.0–17.0)
LYMPHS ABS: 2.8 10*3/uL (ref 0.7–4.0)
Lymphocytes Relative: 21 %
MCH: 34.3 pg — ABNORMAL HIGH (ref 26.0–34.0)
MCHC: 35.9 g/dL (ref 30.0–36.0)
MCV: 95.7 fL (ref 78.0–100.0)
Monocytes Absolute: 1.2 10*3/uL — ABNORMAL HIGH (ref 0.1–1.0)
Monocytes Relative: 9 %
NEUTROS PCT: 69 %
Neutro Abs: 9.2 10*3/uL — ABNORMAL HIGH (ref 1.7–7.7)
Platelets: 218 10*3/uL (ref 150–400)
RBC: 4.4 MIL/uL (ref 4.22–5.81)
RDW: 12.3 % (ref 11.5–15.5)
WBC: 13.3 10*3/uL — ABNORMAL HIGH (ref 4.0–10.5)

## 2016-07-07 MED ORDER — HYDROCODONE-ACETAMINOPHEN 10-325 MG PO TABS: ORAL_TABLET | ORAL | 0 refills | Status: DC

## 2016-07-07 MED ORDER — DOXYCYCLINE HYCLATE 100 MG PO TABS: 100.0000 mg | ORAL_TABLET | Freq: Two times a day (BID) | ORAL | 0 refills | Status: DC

## 2016-07-07 MED ORDER — CEFTRIAXONE SODIUM 1 G IJ SOLR
1.0000 g | Freq: Once | INTRAMUSCULAR | Status: AC
  Administered 2016-07-07: 1 g via INTRAMUSCULAR

## 2016-07-07 NOTE — Progress Notes (Signed)
Subjective:    Patient ID: Ryan Gibson, male    DOB: 09-25-1975, 41 y.o.   MRN: [id number]  HPI Patient arrives with c/o swelling in left elbow for several months. Patient states he had it drained 8 months ago and started swelling again 2-3 months ago. Patient also states he has had fever and vomiting for a few days but unsure if related. Patient states past few days and became tender any has had some localized redness he also relates some fever last night but no fever today he did throw up intermittently the past couple days no diarrhea no sore throat no wheezing he does not feel nauseous denies any vomiting today. States he feels all right except for the elbow  Review of Systems Please see above. No runny nose cough sore throat. No rash    Objective:   Physical Exam Lungs clear hearts regular no rash noted elbow does have redness around it with a swollen olecranon bursa  This area was prepped with alcohol injected with 1% lidocaine No. 18 gauge needle under sterile condition after use of Betadine was inserted in sero sanguinous bloody fluid was obtained approximately 5 mL's this was done under sterile conditions with the patient's permission to try to culture the fluid   CBC stat ordered    Assessment & Plan:  Culture was sent through [facility] Doxycycline twice a day for the next 2 weeks take with the snack and a tall glass liquids Rocephin 1 g Recheck in 24 hours If high fevers or progressively worse go to ER may need admitting for IV fluids and antibiotics

## 2016-07-08 ENCOUNTER — Ambulatory Visit (INDEPENDENT_AMBULATORY_CARE_PROVIDER_SITE_OTHER): Payer: 59 | Admitting: Family Medicine

## 2016-07-08 ENCOUNTER — Encounter: Payer: Self-pay | Admitting: Family Medicine

## 2016-07-08 VITALS — BP 128/86 | Temp 98.4°F | Ht 74.0 in | Wt 283.0 lb

## 2016-07-08 DIAGNOSIS — M109 Gout, unspecified: Secondary | ICD-10-CM

## 2016-07-08 MED ORDER — PREDNISONE 20 MG PO TABS: ORAL_TABLET | ORAL | 0 refills | Status: DC

## 2016-07-08 NOTE — Progress Notes (Signed)
Subjective:    Patient ID: Ryan Gibson, male    DOB: [birth year], 41 y.o.   MRN: [id number]  HPIFollow up cellulitis.   Having joint pain in left hand, left knee and left ankle. Has had a lot of difficulty using left wrist. Also difficulty walking with left ankle. Some limping. States feels definitely like 1 gout flared up.  Started kicking in wed and hurt in the hand and knee and ankle   Notes the left elbow swelling has improved somewhat. Less pain less redness. Tolerating antibiotics well. No fever no chills. Did have fever and chills at the start of this early in the week.   Review of Systems No headache, no major weight loss or weight gain, no chest pain no back pain abdominal pain no change in bowel habits complete ROS otherwise negative     Objective:   Physical Exam  Alert vitals stable, NAD. Blood pressure good on repeat. HEENT normal. Lungs clear. Heart regular rate and rhythm. Left wrist slightly warm to touch positive swelling positive pain with flexion similar findings left ankle. "Crandon region improved less redness less tenderness      Assessment & Plan:  Impression 1 all Crandon bursitis and cellulitis clinically improved #2 flare of gouty arthritis discussed prednisone tapers symptom care discussed

## 2016-07-14 LAB — BODY FLUID CULTURE

## 2016-08-03 ENCOUNTER — Other Ambulatory Visit: Payer: Self-pay | Admitting: Family Medicine

## 2016-09-16 ENCOUNTER — Other Ambulatory Visit: Payer: Self-pay | Admitting: *Deleted

## 2016-09-16 ENCOUNTER — Telehealth: Payer: Self-pay | Admitting: Family Medicine

## 2016-09-16 DIAGNOSIS — M109 Gout, unspecified: Secondary | ICD-10-CM

## 2016-09-16 MED ORDER — PREDNISONE 20 MG PO TABS: ORAL_TABLET | ORAL | 0 refills | Status: DC

## 2016-09-16 NOTE — Telephone Encounter (Signed)
Pt called stating that his gout is flaring up and is wanting to know if prednisone can be called in. Please advise.    [facility]

## 2016-09-16 NOTE — Telephone Encounter (Signed)
Spoke with patient and informed him per Dr.Scott Luking- Prednisone was sent into pharmacy. Also he has order blood work to be completed. Patient verbalized understanding.

## 2016-09-16 NOTE — Telephone Encounter (Signed)
Left message to return call to let know dr scott sent in prednisone but also he wants him to do bloodwork. ( orders are put in and med has been sent to pharm)

## 2016-09-16 NOTE — Telephone Encounter (Signed)
May refill prednisone as on Epic med list, also patient needs to do metabolic 7 with uric acid  toward the end of next week

## 2016-10-25 ENCOUNTER — Other Ambulatory Visit: Payer: Self-pay | Admitting: Family Medicine

## 2016-10-25 NOTE — Telephone Encounter (Signed)
wis 

## 2016-11-03 ENCOUNTER — Telehealth: Payer: Self-pay | Admitting: Family Medicine

## 2016-11-03 MED ORDER — ALLOPURINOL 300 MG PO TABS: 300.0000 mg | ORAL_TABLET | Freq: Every day | ORAL | 5 refills | Status: DC

## 2016-11-03 MED ORDER — PREDNISONE 20 MG PO TABS: ORAL_TABLET | ORAL | 0 refills | Status: DC

## 2016-11-03 NOTE — Telephone Encounter (Signed)
Ok times one pred  Ok for allopurinol plus five ref

## 2016-11-03 NOTE — Telephone Encounter (Signed)
Patient needs refill on allopurinol 300 mg and prednisone 20 mg stating has a gout flare-up. Call into [facility]

## 2016-11-03 NOTE — Telephone Encounter (Signed)
Spoke with patient and informed him per Dr.Steve Luking- refills on prednisone and Allopurinol were sent into pharmacy. Patient verbalized understanding.

## 2016-12-08 ENCOUNTER — Telehealth: Payer: Self-pay | Admitting: Family Medicine

## 2016-12-08 NOTE — Telephone Encounter (Signed)
Rx prior auth APPROVED for pt's testosterone cypionate (DEPOTESTOSTERONE CYPIONATE) 200 MG/ML injection  Valid 11/07/16-12/07/17 per [facility] [phone]  Case# [id number]  Faxed approval information to [facility]

## 2017-02-01 ENCOUNTER — Telehealth: Payer: Self-pay | Admitting: Family Medicine

## 2017-02-01 NOTE — Telephone Encounter (Signed)
Pt brought in FMLA forms to be filled out for his gout. Pt was last seen on 07/08/16 for follow up. Pt is needing the forms to state 6 epidsodes a month at 2 days at a time. Plant nurse put on last addendum that it expired 12/17. Please specify if pt needs to be seen. Form is in folder in office. Please advise.

## 2017-02-09 NOTE — Telephone Encounter (Signed)
Have reviewed situation needs o v first I am not used to writing folks out this many times per mo and I need to see to justify/be sure to keep paperwork avail for that visit

## 2017-02-22 ENCOUNTER — Ambulatory Visit (INDEPENDENT_AMBULATORY_CARE_PROVIDER_SITE_OTHER): Payer: 59 | Admitting: Family Medicine

## 2017-02-22 ENCOUNTER — Encounter: Payer: Self-pay | Admitting: Family Medicine

## 2017-02-22 VITALS — BP 134/88 | Ht 74.0 in | Wt 275.0 lb

## 2017-02-22 DIAGNOSIS — M109 Gout, unspecified: Secondary | ICD-10-CM

## 2017-02-22 DIAGNOSIS — R7989 Other specified abnormal findings of blood chemistry: Secondary | ICD-10-CM

## 2017-02-22 DIAGNOSIS — Z79899 Other long term (current) drug therapy: Secondary | ICD-10-CM | POA: Diagnosis not present

## 2017-02-22 DIAGNOSIS — Z13220 Encounter for screening for lipoid disorders: Secondary | ICD-10-CM | POA: Diagnosis not present

## 2017-02-22 DIAGNOSIS — I10 Essential (primary) hypertension: Secondary | ICD-10-CM | POA: Diagnosis not present

## 2017-02-22 MED ORDER — TESTOSTERONE CYPIONATE 200 MG/ML IM SOLN: INTRAMUSCULAR | 5 refills | Status: DC

## 2017-02-22 MED ORDER — ALLOPURINOL 300 MG PO TABS: 300.0000 mg | ORAL_TABLET | Freq: Every day | ORAL | 11 refills | Status: DC

## 2017-02-22 MED ORDER — BUPROPION HCL ER (SR) 150 MG PO TB12: ORAL_TABLET | ORAL | 5 refills | Status: DC

## 2017-02-22 NOTE — Progress Notes (Signed)
Subjective:    Patient ID: Ryan Gibson, male    DOB: 07-04-1975, 42 y.o.   MRN: [id number]  HPIRecheck gout in right great toe. Finished with prednisone. Doe  A lot of walking ith the job. Sig challenges with gout. ewalks two or thre I , gout flares up and can cause dificulty with walking. Pt takes day of every so often respond to the gout.  occas when bad pt gets on the pred pak   Needs FMLA papers filled out for when he misses work due to gout flare ups.   Light headed in the afternoon.  Pt states he is depressed but doesn't want to fill out PHQ-9. He states he does not want it in his record for his job to see. He states he can answer yes to all the questions on the phQ-9.  BP overall running godd without the meds, numbers often around 130 or 135  Bottom runs around 85 to 90, pt not a fan of medicine.  So patient went ahead and stopped his blood pressure medicine  Patient notes feeling down.  No suicidal thoughts.  Very stress.  Financially strapped.  More arguments with his wife.  More tendency to drink excess alcohol in the evening.  Agrees he may need something to help with his mood.  Review of Systems No headache, no major weight loss or weight gain, no chest pain no back pain abdominal pain no change in bowel habits complete ROS otherwise negative     Objective:   Physical Exam Alert and oriented, vitals reviewed and stable, NAD ENT-TM's and ext canals WNL bilat via otoscopic exam Soft palate, tonsils and post pharynx WNL via oropharyngeal exam Neck-symmetric, no masses; thyroid nonpalpable and nontender Pulmonary-no tachypnea or accessory muscle use; Clear without wheezes via auscultation Card--no abnrml murmurs, rhythm reg and rate WNL Carotid pulses symmetric, without bruits        Assessment & Plan:  Impression 1 hypertension good control now off medication.  Meds stopped per patient  2.  Gout.  Intermittent.  On allopurinol.  Still has multiple flares.   Has to take off multiple episodes per month apparently for this and patient states as long as FMLA reflects this is workplace is okay with this  3.  Depression/anxiety patient declines to view PHQ 9.  Long discussion held amenable to medication  4.  Increased alcohol use.  Declines alcohol counselor.  Back considerably Hypo-testosterone.  On injections.  Will check numbers Follow-up in 3 months medications prescribed.  #5

## 2017-03-03 ENCOUNTER — Telehealth: Payer: Self-pay | Admitting: Family Medicine

## 2017-03-03 MED ORDER — PREDNISONE 20 MG PO TABS: ORAL_TABLET | ORAL | 0 refills | Status: DC

## 2017-03-03 NOTE — Telephone Encounter (Signed)
Patient calling to check on this  

## 2017-03-03 NOTE — Telephone Encounter (Signed)
Patient says he is having a flare up of gout in his left foot.  Requesting Rx for prednisone called in.   [facility] [place]

## 2017-03-03 NOTE — Telephone Encounter (Signed)
Prescription sent electronically to pharmacy. Patient notified. 

## 2017-03-03 NOTE — Telephone Encounter (Signed)
I recommend prednisone, 20 mg, #18,3qd for 3d then 2qd for 3d then 1qd for 3d

## 2017-03-24 ENCOUNTER — Telehealth: Payer: Self-pay | Admitting: Family Medicine

## 2017-03-24 MED ORDER — OSELTAMIVIR PHOSPHATE 75 MG PO CAPS: 75.0000 mg | ORAL_CAPSULE | Freq: Two times a day (BID) | ORAL | 0 refills | Status: AC

## 2017-03-24 NOTE — Telephone Encounter (Signed)
Patient is requesting prescription for tamiflu called in. He thinks possible flu because his family has been sick, Offered appointment he declined and wanting message sent back uses [facility].

## 2017-03-24 NOTE — Telephone Encounter (Signed)
If kids had I dont mind calling tamiflu 75 bid fivce d to er if worsens

## 2017-03-24 NOTE — Telephone Encounter (Signed)
I spoke with the pt he states he woke up this am vomiting,achey,stopped up head. He states his children had the flu and he thinks he has it now. Thinks he has a temp,but no thermometer to check. He has the chills as well. He declines appt I told him I would notify you of this,but pt may need to come in to be evaluated.

## 2017-03-24 NOTE — Telephone Encounter (Signed)
Pt is aware [facility].

## 2017-05-23 ENCOUNTER — Ambulatory Visit: Payer: 59 | Admitting: Family Medicine

## 2017-08-14 ENCOUNTER — Encounter: Payer: Self-pay | Admitting: Family Medicine

## 2017-08-14 ENCOUNTER — Ambulatory Visit: Payer: 59 | Admitting: Family Medicine

## 2017-08-14 VITALS — BP 140/88 | Ht 74.0 in

## 2017-08-14 DIAGNOSIS — M25561 Pain in right knee: Secondary | ICD-10-CM

## 2017-08-14 MED ORDER — HYDROCODONE-ACETAMINOPHEN 5-325 MG PO TABS: ORAL_TABLET | ORAL | 0 refills | Status: DC

## 2017-08-14 MED ORDER — PREDNISONE 20 MG PO TABS: ORAL_TABLET | ORAL | 0 refills | Status: DC

## 2017-08-14 MED ORDER — METHYLPREDNISOLONE ACETATE 40 MG/ML IJ SUSP
40.0000 mg | Freq: Once | INTRAMUSCULAR | Status: AC
  Administered 2017-08-14: 40 mg via INTRAMUSCULAR

## 2017-08-14 NOTE — Progress Notes (Signed)
Subjective:    Patient ID: Ryan Gibson, male    DOB: [birth year], 42 y.o.   MRN: [id number]  Knee Pain   The incident occurred 3 to 5 days ago. The incident occurred at work. The pain is present in the right knee. The pain is at a severity of 10/10. Associated symptoms include an inability to bear weight. Associated symptoms comments: nausea. The symptoms are aggravated by weight bearing. He has tried elevation, ice and rest (Hydrocodone, Mobic) for the symptoms.   Pt was unable to get out of bed Sunday morning. Pt states he can not get any relief. Excruciating pain; feels like something is pulling on back of leg.   Progressive pain mid last week  Any motion very sevre pain   No fever, some swats  Does not rcall sudden injury   Jumped off a height felt a popping sensation, stiff on the morning  Wed night occurred umping off   No hx of gout in the nee Review of Systems No headache, no major weight loss or weight gain, no chest pain no back pain abdominal pain no change in bowel habits complete ROS otherwise negative     Objective:   Physical Exam  Alert and oriented, vitals reviewed and stable, NAD ENT-TM's and ext canals WNL bilat via otoscopic exam Soft palate, tonsils and post pharynx WNL via oropharyngeal exam Neck-symmetric, no masses; thyroid nonpalpable and nontender Pulmonary-no tachypnea or accessory muscle use; Clear without wheezes via auscultation Card--no abnrml murmurs, rhythm reg and rate WNL Carotid pulses symmetric, without bruits  Right knee positive Lockman sign positive Murphy's sign positive effusion diffuse tenderness.  No obvious joint laxity warm to touch     Assessment & Plan:  Impression posttraumatic  inflammatory changes of right knee complicated by history of gout.  Patient would like to take conservative approach first.  Complicated by trauma the start of this episode.  Also complicated by history of knee troubles in the past.  Also  complicated by gout.  All discussed in length.  Will give steroid injection prednisone taper hydrocodone.  Right knee x-ray  Pain use crutches off work for a week if persists call us and we will work on setting up both an MRI and an orthopedic referral.  Greater than 50% of this 25 minute face to face visit was spent in counseling and discussion and coordination of care regarding the above diagnosis/diagnosies

## 2017-08-17 ENCOUNTER — Telehealth: Payer: Self-pay | Admitting: Family Medicine

## 2017-08-17 NOTE — Telephone Encounter (Signed)
error 

## 2017-08-18 DIAGNOSIS — M25461 Effusion, right knee: Secondary | ICD-10-CM | POA: Diagnosis not present

## 2017-08-18 DIAGNOSIS — M25561 Pain in right knee: Secondary | ICD-10-CM | POA: Diagnosis not present

## 2017-08-21 DIAGNOSIS — M25461 Effusion, right knee: Secondary | ICD-10-CM | POA: Diagnosis not present

## 2017-08-25 DIAGNOSIS — M25561 Pain in right knee: Secondary | ICD-10-CM | POA: Diagnosis not present

## 2017-08-25 DIAGNOSIS — M25461 Effusion, right knee: Secondary | ICD-10-CM | POA: Diagnosis not present

## 2017-09-08 DIAGNOSIS — M25461 Effusion, right knee: Secondary | ICD-10-CM | POA: Diagnosis not present

## 2017-09-08 DIAGNOSIS — M25561 Pain in right knee: Secondary | ICD-10-CM | POA: Diagnosis not present

## 2017-09-13 ENCOUNTER — Telehealth: Payer: Self-pay | Admitting: Family Medicine

## 2017-09-13 MED ORDER — PREDNISONE 20 MG PO TABS: ORAL_TABLET | ORAL | 0 refills | Status: DC

## 2017-09-13 NOTE — Telephone Encounter (Signed)
Prescription sent electronically to pharmacy. Patient notified. 

## 2017-09-13 NOTE — Telephone Encounter (Signed)
Adult pred taper 

## 2017-09-13 NOTE — Telephone Encounter (Signed)
Patient said he is having a gout flare up in his left knee and right ankle.  He is requesting Rx for prednisone called in.  [facility] on [address]

## 2017-09-14 DIAGNOSIS — M7651 Patellar tendinitis, right knee: Secondary | ICD-10-CM | POA: Diagnosis not present

## 2017-09-21 ENCOUNTER — Encounter (HOSPITAL_COMMUNITY): Payer: Self-pay

## 2017-09-21 ENCOUNTER — Other Ambulatory Visit: Payer: Self-pay

## 2017-09-21 ENCOUNTER — Ambulatory Visit (HOSPITAL_COMMUNITY): Payer: 59 | Attending: Orthopedic Surgery

## 2017-09-21 DIAGNOSIS — G8929 Other chronic pain: Secondary | ICD-10-CM | POA: Diagnosis not present

## 2017-09-21 DIAGNOSIS — M6281 Muscle weakness (generalized): Secondary | ICD-10-CM | POA: Diagnosis present

## 2017-09-21 DIAGNOSIS — R600 Localized edema: Secondary | ICD-10-CM

## 2017-09-21 DIAGNOSIS — M25561 Pain in right knee: Secondary | ICD-10-CM | POA: Insufficient documentation

## 2017-09-21 DIAGNOSIS — R29898 Other symptoms and signs involving the musculoskeletal system: Secondary | ICD-10-CM | POA: Diagnosis present

## 2017-09-21 NOTE — Patient Instructions (Signed)
Access Code: [email or web address]  URL: [email or web address]  Date: 09/21/2017  Prepared by: Jac CanavanBrooke Powell   Exercises Prone Quadriceps Stretch with Strap - 10 reps - 3 sets - 1x daily - 7x weekly Sidelying Hip Abduction - 10 reps - 3 sets - 1x daily - 7x weekly Seated Long Arc Quad with Ankle Weight - 10 reps - 3 sets - 1x daily - 7x weekly Supine Short Arc Quad - 10 reps - 3 sets - 1x daily - 7x weekly

## 2017-09-21 NOTE — Therapy (Signed)
[facility] [address] [place], [place], [address] Phone: [phone]   Fax:  [phone]  Physical Therapy Evaluation  Patient Details  Name: Ryan Gibson MRN: [id number] Date of Birth: 1975/08/22 Referring Provider: Yolonda Kida, MD   Encounter Date: 09/21/2017  PT End of Session - 09/21/17 1623    Visit Number  1    Number of Visits  7    Date for PT Re-Evaluation  11/02/17    Authorization Type  [employer]    Authorization Time Period  09/21/17 to 11/02/17    Authorization - Visit Number  1    Authorization - Number of Visits  30    PT Start Time  1300    PT Stop Time  1343    PT Time Calculation (min)  43 min    Activity Tolerance  Patient tolerated treatment well;Patient limited by pain    Behavior During Therapy  [facility] for tasks assessed/performed       Past Medical History:  Diagnosis Date  . Asthma    AS A CHILD ONLY  . Foot pain, right    painful x 1 month and swelling  . Hypertension     Past Surgical History:  Procedure Laterality Date  . HERNIA REPAIR  07/13/2011   RIH  . INGUINAL HERNIA REPAIR  07/13/2011   Procedure: HERNIA REPAIR INGUINAL ADULT;  Surgeon: Wilmon Arms. Corliss Skains, MD;  Location: [facility];  Service: General;  Laterality: Right;  . NO PREVIOUS SURGERY      There were no vitals filed for this visit.   Subjective Assessment - 09/21/17 1305    Subjective  Pt states that he took a step off of a curb and he felt a pop. he has had an MRI which showed Osgood-Schlatters. He was initially told to stay off of it and wore a knee immobilizer but then his surgeon told him that he needs to move. He states that after he stayed off of it for those few weeks, when he tried to stand on it, he had increased difficulty due to pain and weakness. Last night when he was playing with his dtr on the couch and when he threw his leg over he felt a pop in his knee at the anterior, medial, and lateral aspect of  it. He denies any catching but states that it will buckle on him. He reports difficulty with standing for long periods of time, walking downhill cuases his kene to burn and buckle (no issues with up hill), and just general WB activities all aggravate his pain. He is currently out of work.    Limitations  Lifting;Standing;Walking;House hold activities    How long can you sit comfortably?  no issues    How long can you stand comfortably?     How long can you walk comfortably?  no real issues, just uncomfortable    Diagnostic tests  MRI showed Osgood-Schlatters    Patient Stated Goals  decrease pain     Currently in Pain?  No/denies         [facility] PT Assessment - 09/21/17 0001      Assessment   Medical Diagnosis  unspecified internal derangement of R knee    Referring Provider  Yolonda Kida, MD    Onset Date/Surgical Date  -- June 2019     Next MD Visit  end of August, after PT    Prior Therapy  none  Balance Screen   Has the patient fallen in the past 6 months  No    Has the patient had a decrease in activity level because of a fear of falling?   No    Is the patient reluctant to leave their home because of a fear of falling?   No      Prior Function   Level of Independence  Independent    Vocation  Full time employment currently out of work until MD clears him    Vocation Requirements  works for railroad    Leisure  hunting, fishing, used to play ball       Observation/Other Assessments   Focus on Therapeutic Outcomes (FOTO)   to be completed next visit      Observation/Other Assessments-Edema    Edema  Circumferential      Circumferential Edema   Circumferential - Right  39.5cm, joint line    Circumferential - Left   39cm, joint line      ROM / Strength   AROM / PROM / Strength  AROM;Strength      AROM   Overall AROM Comments  to be assessed next visit    AROM Assessment Site  Knee    Right/Left Knee  Right;Left      Strength   Strength Assessment  Site  Hip;Knee;Ankle    Right Hip Flexion  4+/5    Right Hip Extension  4/5    Right Hip ABduction  4+/5    Left Hip Flexion  5/5    Left Hip Extension  4+/5    Left Hip ABduction  4+/5    Right Knee Flexion  4+/5    Right Knee Extension  4+/5    Left Knee Flexion  5/5    Left Knee Extension  5/5    Right Ankle Dorsiflexion  5/5    Left Ankle Dorsiflexion  5/5      Flexibility   Soft Tissue Assessment /Muscle Length  yes    Quadriceps  +Ely's RLE      Palpation   Patella mobility  WNL    Palpation comment  restrictions in quad and patellar tendon, tender to touch on patellar tendon      Special Tests    Special Tests  Meniscus Tests;Laxity/Instability Tests    Laxity/Instability   Anterior drawer test;Posterior drawer test;other;other2    Meniscus Tests  other      Anterior drawer test   Findings  Negative    Side  Right    Comment  anterior drawer and pivot shift negative for ACL laxity      Posterior drawer test   Findings  Negative    Side   Right      Other   Findings  Negative    side  Right    comment  valgus stress test      other   Findings  Negative    side  Right    Comment  varus stress test      other   Findings  Negative    Side  Right    Comments  Thessaly's -- caused burning at anterior knee but no pan      Balance   Balance Assessed  Yes      Static Standing Balance   Static Standing - Balance Support  No upper extremity supported    Static Standing Balance -  Activities   Single Leg Stance - Right Leg;Single Leg Stance -  Left Leg    Static Standing - Comment/# of Minutes  R: 23sec L: 1:00      Standardized Balance Assessment   Standardized Balance Assessment  Five Times Sit to Stand    Five times sit to stand comments   14.3sec, chair, no UE, offshifted from RLE          Objective measurements completed on examination: See above findings.        PT Education - 09/21/17 1623    Education Details  exam findings, POC, HEP     Person(s) Educated  Patient    Methods  Explanation;Demonstration;Handout    Comprehension  Verbalized understanding;Returned demonstration       PT Short Term Goals - 09/21/17 1628      PT SHORT TERM GOAL #1   Title  Pt will be independent with advanced HEP and perform consistently so as to decrease his pain and promote return to PLOF.    Time  3    Period  Weeks    Status  New    Target Date  10/12/17      PT SHORT TERM GOAL #2   Title  Pt will have decreased R knee edema to 39cm in order to maximize his ROM and decrease overall pain.     Time  3    Period  Weeks    Status  New      PT SHORT TERM GOAL #3   Title  Pt will be able to perform R SLS for 60sec in order to demo improved functional strength and maximize his gait.         PT Long Term Goals - 09/21/17 1628      PT LONG TERM GOAL #1   Title  Pt will be 5/5 throughout BLE MMT in order to maximize his gait when walking downhill and to decrease his pain with other functional activities.    Time  6    Period  Weeks    Status  New    Target Date  11/02/17      PT LONG TERM GOAL #2   Title  Pt will have R knee AROM from 0-130deg to decrease pain and maximize ambulation as well as stair ambulation.     Time  6    Period  Weeks    Status  New      PT LONG TERM GOAL #3   Title  Pt will report being able to stand for or > without increased knee pain to demo improved functional strength and tolerance to functional activities.    Time  6    Period  Weeks    Status  New      PT LONG TERM GOAL #4   Title  Pt will report being able to ambulate unlimited community distances without pain and without feelings of instability in order to demo improved overall functional strength and maximize his ability to return to work once cleared by the MD.    Time  6    Period  Weeks    Status  New             Plan - 09/21/17 1624    Clinical Impression Statement  Pt is pleasant 41YO M who presents to OPPT with c/o  subacute R knee pain. Pt reporting that he stepped off of a curb in June 2019 and heard a pop and had subsequent swelling and increased pain with WB a few days later.  He reports chronic history of R knee pain in which about every 3-4 months it would almost give out on him and then he would take Prednisone and feel back to normal for a while until it happened again. He recently took some Prednisone so his pain has since improved however, he currently presents with deficits in MMT, edema (mild), functional strength, balance, gait, and functional mobility due to knee pain. Pt negative for meniscal and ligament testing throughout R knee; he did have the most apprehension with pivot-shift testing but no laxity noted. Pt also noted to have minor R quad atrophy with soft tissue restrictions of R quad and increased tenderness to R patellar tendon compared to L knee. 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 Pt needs skilled PT intervention to address impairments in order to decrease pain and maximize his return to PLOF. Pt requesting to come 1x/week due to financial reasons so his HEP should be updated regularly.    Clinical Presentation  Stable    Clinical Presentation due to:  edema, MMT, palpation, soft tissue restrictions, pain, functional mobility deficits, core strength, balance, gait, funtional strength     Clinical Decision Making  Low    Rehab Potential  Good    PT Frequency  1x / week    PT Duration  6 weeks    PT Treatment/Interventions  ADLs/Self Care Home Management;Aquatic Therapy;Cryotherapy;Electrical Stimulation;Moist Heat;Traction;Ultrasound;DME Instruction;Gait training;Stair training;Functional mobility training;Therapeutic activities;Therapeutic exercise;Balance training;Neuromuscular re-education;Patient/family education;Manual techniques;Scar mobilization;Passive range of motion;Dry  needling;Taping    PT Next Visit Plan  review goals and HEP; measure knee AROM, perform FOTO; address any limitations in ROM noted, address the mild edema present, R hip, quad, and functional strengthening within pain tolerance, progressing as able; HEP shoulde be updated regularly as pt is 1x/week    PT Home Exercise Plan  eval: prone quad stretch, sidelying hip abd, LAQ, SAQ    Consulted and Agree with Plan of Care  Patient       Patient will benefit from skilled therapeutic intervention in order to improve the following deficits and impairments:  Decreased activity tolerance, Decreased balance, Decreased range of motion, Decreased strength, Difficulty walking, Hypomobility, Increased edema, Increased muscle spasms, Impaired flexibility, Improper body mechanics, Pain  Visit Diagnosis: Chronic pain of right knee - Plan: PT plan of care cert/re-cert  Localized edema - Plan: PT plan of care cert/re-cert  Muscle weakness (generalized) - Plan: PT plan of care cert/re-cert  Other symptoms and signs involving the musculoskeletal system - Plan: PT plan of care cert/re-cert     Problem List Patient Active Problem List   Diagnosis Date Noted  . Hypogonadism male 11/24/2015  . Gout 11/01/2014  . Essential hypertension 03/10/2014  . Right inguinal hernia 06/28/2011       Jac Canavan PT, DPT  [facility] [address] [place], [place], [address] Phone: [phone]   Fax:  [phone]  Name: Amaree Jone MRN: [id number] Date of Birth: December 05, 1975

## 2017-09-28 ENCOUNTER — Encounter (HOSPITAL_COMMUNITY): Payer: Self-pay | Admitting: Physical Therapy

## 2017-09-28 ENCOUNTER — Ambulatory Visit (HOSPITAL_COMMUNITY): Payer: 59 | Admitting: Physical Therapy

## 2017-09-28 DIAGNOSIS — G8929 Other chronic pain: Secondary | ICD-10-CM

## 2017-09-28 DIAGNOSIS — M6281 Muscle weakness (generalized): Secondary | ICD-10-CM

## 2017-09-28 DIAGNOSIS — R29898 Other symptoms and signs involving the musculoskeletal system: Secondary | ICD-10-CM

## 2017-09-28 DIAGNOSIS — M25561 Pain in right knee: Principal | ICD-10-CM

## 2017-09-28 DIAGNOSIS — R600 Localized edema: Secondary | ICD-10-CM

## 2017-09-28 NOTE — Therapy (Signed)
[facility] [address] [place], [place], [address] Phone: [phone]   Fax:  [phone]  Physical Therapy Treatment  Patient Details  Name: Ryan Gibson MRN: [id number] Date of Birth: 11/18/1975 Referring Provider: Yolonda Kida, MD   Encounter Date: 09/28/2017  PT End of Session - 09/28/17 1246    Visit Number  2    Number of Visits  7    Date for PT Re-Evaluation  11/02/17    Authorization Type  [employer]    Authorization Time Period  09/21/17 to 11/02/17    Authorization - Visit Number  2    Authorization - Number of Visits  30    PT Start Time  0947    PT Stop Time  1030    PT Time Calculation (min)  43 min    Activity Tolerance  Patient tolerated treatment well;Patient limited by pain    Behavior During Therapy  [facility] for tasks assessed/performed       Past Medical History:  Diagnosis Date  . Asthma    AS A CHILD ONLY  . Foot pain, right    painful x 1 month and swelling  . Hypertension     Past Surgical History:  Procedure Laterality Date  . HERNIA REPAIR  07/13/2011   RIH  . INGUINAL HERNIA REPAIR  07/13/2011   Procedure: HERNIA REPAIR INGUINAL ADULT;  Surgeon: Wilmon Arms. Corliss Skains, MD;  Location: [facility];  Service: General;  Laterality: Right;  . NO PREVIOUS SURGERY      There were no vitals filed for this visit.  Subjective Assessment - 09/28/17 1242    Subjective  Patient reported that since he stopped taking prednisone and over the past week his knee has become significantly more swollen and he is unable to bear weight on his leg. He has been using crutches and reported that he plans to discuss with his doctor possible surgery.     Limitations  Lifting;Standing;Walking;House hold activities    How long can you sit comfortably?  no issues    How long can you stand comfortably?  0 minutes    How long can you walk comfortably?  Not able to walk without crutches right now    Diagnostic tests  MRI  showed Osgood-Schlatters    Patient Stated Goals  decrease pain     Currently in Pain?  Yes    Pain Score  4     Pain Location  Knee    Pain Orientation  Right    Pain Descriptors / Indicators  Aching    Pain Frequency  Constant         [facility] PT Assessment - 09/28/17 0001      Observation/Other Assessments   Focus on Therapeutic Outcomes (FOTO)   21% (71% limited)      Circumferential Edema   Circumferential - Right  46 cm midpatella    Circumferential - Left   43.5 cm midpatella      AROM   Right/Left Knee  Right    Right Knee Extension  50    Right Knee Flexion  85      Palpation   Palpation comment  Patient reported significant tenderness to palpation of right tibial tubercle and quadriceps tendon                   [facility] Adult PT Treatment/Exercise - 09/28/17 0001      Knee/Hip Exercises: Supine   Quad Sets  [facility] [address] [place], [place], [address] Phone: [phone]   Fax:  [phone]  Physical Therapy Treatment  Patient Details  Name: Ryan Gibson MRN: [id number] Date of Birth: 11/18/1975 Referring Provider: Yolonda Kida, MD   Encounter Date: 09/28/2017  PT End of Session - 09/28/17 1246    Visit Number  2    Number of Visits  7    Date for PT Re-Evaluation  11/02/17    Authorization Type  [employer]    Authorization Time Period  09/21/17 to 11/02/17    Authorization - Visit Number  2    Authorization - Number of Visits  30    PT Start Time  0947    PT Stop Time  1030    PT Time Calculation (min)  43 min    Activity Tolerance  Patient tolerated treatment well;Patient limited by pain    Behavior During Therapy  [facility] for tasks assessed/performed       Past Medical History:  Diagnosis Date  . Asthma    AS A CHILD ONLY  . Foot pain, right    painful x 1 month and swelling  . Hypertension     Past Surgical History:  Procedure Laterality Date  . HERNIA REPAIR  07/13/2011   RIH  . INGUINAL HERNIA REPAIR  07/13/2011   Procedure: HERNIA REPAIR INGUINAL ADULT;  Surgeon: Wilmon Arms. Corliss Skains, MD;  Location: [facility];  Service: General;  Laterality: Right;  . NO PREVIOUS SURGERY      There were no vitals filed for this visit.  Subjective Assessment - 09/28/17 1242    Subjective  Patient reported that since he stopped taking prednisone and over the past week his knee has become significantly more swollen and he is unable to bear weight on his leg. He has been using crutches and reported that he plans to discuss with his doctor possible surgery.     Limitations  Lifting;Standing;Walking;House hold activities    How long can you sit comfortably?  no issues    How long can you stand comfortably?  0 minutes    How long can you walk comfortably?  Not able to walk without crutches right now    Diagnostic tests  MRI  showed Osgood-Schlatters    Patient Stated Goals  decrease pain     Currently in Pain?  Yes    Pain Score  4     Pain Location  Knee    Pain Orientation  Right    Pain Descriptors / Indicators  Aching    Pain Frequency  Constant         [facility] PT Assessment - 09/28/17 0001      Observation/Other Assessments   Focus on Therapeutic Outcomes (FOTO)   21% (71% limited)      Circumferential Edema   Circumferential - Right  46 cm midpatella    Circumferential - Left   43.5 cm midpatella      AROM   Right/Left Knee  Right    Right Knee Extension  50    Right Knee Flexion  85      Palpation   Palpation comment  Patient reported significant tenderness to palpation of right tibial tubercle and quadriceps tendon                   [facility] Adult PT Treatment/Exercise - 09/28/17 0001      Knee/Hip Exercises: Supine   Quad Sets  well as a significant decrease in function as now he is unable to ambulate without using crutches. Patient demonstrated significant swelling, deficits in right knee AROM as well as functional limitations indicated by FOTO score. Therapist discussed findings and told patient to follow up  with his physician as patient had been wanting to regarding if they recommend a change in the course of action with a change in patient's symptoms. The remainder of the session reviewed patient's evaluation and goals, educated patient on exercises and methods to decrease edema, and had patient perform exercises. Session ended with therapist performing retrograde massage to patient's right lower extremity to decrease edema. Told patient to follow-up with clinic regarding what his physician says.     Clinical Presentation  Evolving    Rehab Potential  Good    PT Frequency  1x / week    PT Duration  6 weeks    PT Treatment/Interventions  ADLs/Self Care Home Management;Aquatic Therapy;Cryotherapy;Electrical Stimulation;Moist Heat;Traction;Ultrasound;DME Instruction;Gait training;Stair training;Functional mobility training;Therapeutic activities;Therapeutic exercise;Balance training;Neuromuscular re-education;Patient/family education;Manual techniques;Scar mobilization;Passive range of motion;Dry needling;Taping    PT Next Visit Plan  review goals and HEP; measure knee AROM, perform FOTO; address any limitations in ROM noted, address the mild edema present, R hip, quad, and functional strengthening within pain tolerance, progressing as able; HEP shoulde be updated regularly as pt is 1x/week    PT Home Exercise Plan  eval: prone quad stretch, sidelying hip abd, LAQ, SAQ; 09/28/17: Hold on quad stretch, ankle pumps with legs elevated 2x30 2x/day, quad set 1x10 right lower extremity 1x/day    Consulted and Agree with Plan of Care  Patient       Patient will benefit from skilled therapeutic intervention in order to improve the following deficits and impairments:  Decreased activity tolerance, Decreased balance, Decreased range of motion, Decreased strength, Difficulty walking, Hypomobility, Increased edema, Increased muscle spasms, Impaired flexibility, Improper body mechanics, Pain  Visit Diagnosis: Chronic  pain of right knee  Localized edema  Muscle weakness (generalized)  Other symptoms and signs involving the musculoskeletal system     Problem List Patient Active Problem List   Diagnosis Date Noted  . Hypogonadism male 11/24/2015  . Gout 11/01/2014  . Essential hypertension 03/10/2014  . Right inguinal hernia 06/28/2011   Verne CarrowMacy Anissa Abbs PT, DPT 1:02 PM, 09/28/17 [phone]  [facility] [address] [place], [place], [address] Phone: [phone]   Fax:  [phone]  Name: Ryan Gibson MRN: [id number] Date of Birth: 1975-07-30

## 2017-10-02 DIAGNOSIS — M7651 Patellar tendinitis, right knee: Secondary | ICD-10-CM | POA: Diagnosis not present

## 2017-10-02 DIAGNOSIS — M25461 Effusion, right knee: Secondary | ICD-10-CM | POA: Diagnosis not present

## 2017-10-02 DIAGNOSIS — M25561 Pain in right knee: Secondary | ICD-10-CM | POA: Diagnosis not present

## 2017-10-03 ENCOUNTER — Telehealth (HOSPITAL_COMMUNITY): Payer: Self-pay | Admitting: Family Medicine

## 2017-10-03 NOTE — Telephone Encounter (Signed)
10/03/17  pt called to say that his dr says he will have surgery and to discharge him until he returns after surgery

## 2017-10-04 ENCOUNTER — Ambulatory Visit (HOSPITAL_COMMUNITY): Payer: 59

## 2017-10-05 ENCOUNTER — Encounter (HOSPITAL_COMMUNITY): Payer: Self-pay

## 2017-10-05 NOTE — Therapy (Signed)
[place], [place], [address] Phone: [phone]   Fax:  [phone]  Patient Details  Name: Ryan Gibson MRN: [id number] Date of Birth: [birth year] Referring Provider:  No ref. provider found  Encounter Date: 10/05/2017    10/03/17  pt called to say that his dr says he will have surgery and to discharge him until he returns after surgery  [place]  Visits from Start of Care: 2  Current functional level related to goals / functional outcomes: See last treatment note   Remaining deficits: See last treatment note   Education / Equipment: N/A  Plan: Patient agrees to discharge.  Patient goals were not met. Patient is being discharged due to a change in medical status.  ?????      Geraldine Solar PT, [place] [address] [place], [place], [address] Phone: [phone]   Fax:  [phone]

## 2017-10-12 ENCOUNTER — Encounter (HOSPITAL_COMMUNITY): Payer: Self-pay

## 2017-10-19 ENCOUNTER — Encounter (HOSPITAL_COMMUNITY): Payer: Self-pay

## 2017-10-26 ENCOUNTER — Encounter (HOSPITAL_COMMUNITY): Payer: Self-pay

## 2017-10-27 ENCOUNTER — Telehealth: Payer: Self-pay | Admitting: Family Medicine

## 2017-10-27 MED ORDER — PREDNISONE 20 MG PO TABS: ORAL_TABLET | ORAL | 0 refills | Status: DC

## 2017-10-27 MED ORDER — COLCHICINE 0.6 MG PO TABS: 0.6000 mg | ORAL_TABLET | Freq: Two times a day (BID) | ORAL | 3 refills | Status: DC

## 2017-10-27 NOTE — Telephone Encounter (Signed)
Prednisone taper, 20 mg, #18,3qd for 3d then 2qd for 3d then 1qd for 3d Also colchicine 0.6 mg twice daily as needed until relief of gout attack, #20, 3 refills

## 2017-10-27 NOTE — Telephone Encounter (Signed)
Pt was suppose to have knee surgery today but was unable to due to having a gout flair up. He would like to have prednisone called in to [id number] - [place], [place] - [address] AT [facility] [address] He is non weight baring on both knees and is really hoping this can be called in as soon as possible. Please call wifes cell his is at home. CB# [phone].

## 2017-10-27 NOTE — Telephone Encounter (Signed)
Prescriptions sent electronically to pharmacy. Left message to return call to notified patient

## 2017-10-27 NOTE — Telephone Encounter (Signed)
Pt.notified

## 2017-10-30 DIAGNOSIS — M2550 Pain in unspecified joint: Secondary | ICD-10-CM | POA: Diagnosis not present

## 2017-10-30 DIAGNOSIS — M25461 Effusion, right knee: Secondary | ICD-10-CM | POA: Diagnosis not present

## 2017-10-30 DIAGNOSIS — M25462 Effusion, left knee: Secondary | ICD-10-CM | POA: Diagnosis not present

## 2017-11-02 ENCOUNTER — Encounter (HOSPITAL_COMMUNITY): Payer: Self-pay

## 2017-11-02 DIAGNOSIS — M7651 Patellar tendinitis, right knee: Secondary | ICD-10-CM | POA: Diagnosis not present

## 2017-11-08 ENCOUNTER — Encounter: Payer: Self-pay | Admitting: Family Medicine

## 2017-11-08 ENCOUNTER — Ambulatory Visit: Payer: 59 | Admitting: Family Medicine

## 2017-11-08 VITALS — BP 124/78 | Temp 97.6°F | Ht 74.0 in

## 2017-11-08 DIAGNOSIS — J329 Chronic sinusitis, unspecified: Secondary | ICD-10-CM | POA: Diagnosis not present

## 2017-11-08 DIAGNOSIS — M25532 Pain in left wrist: Secondary | ICD-10-CM

## 2017-11-08 DIAGNOSIS — M109 Gout, unspecified: Secondary | ICD-10-CM

## 2017-11-08 DIAGNOSIS — J310 Chronic rhinitis: Secondary | ICD-10-CM

## 2017-11-08 MED ORDER — PREDNISONE 20 MG PO TABS: ORAL_TABLET | ORAL | 0 refills | Status: DC

## 2017-11-08 MED ORDER — AMOXICILLIN-POT CLAVULANATE 875-125 MG PO TABS: ORAL_TABLET | ORAL | 0 refills | Status: DC

## 2017-11-08 NOTE — Progress Notes (Signed)
Subjective:    Patient ID: Ryan Gibson, male    DOB: 06/11/1975, 42 y.o.   MRN: [id number]  Wrist Pain   The pain is present in the left wrist and left arm. This is a recurrent problem. The current episode started more than 1 month ago. The quality of the pain is described as aching.   Patellar  Tendon was torn, pt went thru round of therapy and it did not work, was walking  Had surgery rescheduled due to fevr  Went to surg with fever   Had leg tapped and it was not rheumatoid arthritis per patient due to recurrent flares of arthritis for recent years, orthopedist set the patient up with a rheumatologist.  Pt had flare of wrist arthitits too, recalls no injury, does state however the wrist was struck went roughhousing with 1 of his children  Pt also has questions about his knees. He was suppose to have surgery on his right knee but was unable to due to having a fever. Pt sates he has not been able to walk and has been on bed rest for 2-3 months    . Also has developed a cough and father states that patient has lost about 30 lbs in 3 months from being inactive.    Review of Systems No headache, no major weight loss or weight gain, no chest pain no back pain abdominal pain no change in bowel habits complete ROS otherwise negative     Objective:   Physical Exam Alert and oriented, vitals reviewed and stable, NAD ENT-TM's and ext canals WNL positive nasal congestion and stuffiness otherwise ears normal bilat via otoscopic exam Soft palate, tonsils and post pharynx WNL via oropharyngeal exam Neck-symmetric, no masses; thyroid nonpalpable and nontender Pulmonary-no tachypnea or accessory muscle use; Clear without wheezes via auscultation Card--no abnrml murmurs, rhythm reg and rate WNL Carotid pulses symmetric, without bruits Left wrist wrapped swollen tenderness along joint line  Left knee is somewhat swollen not warm no obvious joint laxity pain with extension      Assessment & Plan:  Impression 1 recent febrile illness.  Likely initially viral.  Patient may have an element secondary rhinosinusitis.  Discussed.  Will cover with antibiotics.  2.  Recurrent gout.  Ongoing challenges with this.  Now patient orthopedist recommends rheumatology visit.  Which I think actually is a good idea  3.  PVCs by history.  Occasional palpitations.  Transient sense of fullness in the chest.  No exertional pain discussed.  Transient symptoms occurs over just a few seconds.  Patient generally reassured.  Chance of serious heart disease very low at this time per history  Greater than 50% of this 25 minute face to face visit was spent in counseling and discussion and coordination of care regarding the above diagnosis/diagnosies

## 2017-11-21 DIAGNOSIS — R05 Cough: Secondary | ICD-10-CM | POA: Diagnosis not present

## 2017-11-21 DIAGNOSIS — M25562 Pain in left knee: Secondary | ICD-10-CM | POA: Diagnosis not present

## 2017-11-21 DIAGNOSIS — R5382 Chronic fatigue, unspecified: Secondary | ICD-10-CM | POA: Diagnosis not present

## 2017-11-21 DIAGNOSIS — M2550 Pain in unspecified joint: Secondary | ICD-10-CM | POA: Diagnosis not present

## 2017-11-29 DIAGNOSIS — M2550 Pain in unspecified joint: Secondary | ICD-10-CM | POA: Diagnosis not present

## 2017-11-29 DIAGNOSIS — M25562 Pain in left knee: Secondary | ICD-10-CM | POA: Diagnosis not present

## 2017-11-29 DIAGNOSIS — M1A09X0 Idiopathic chronic gout, multiple sites, without tophus (tophi): Secondary | ICD-10-CM | POA: Diagnosis not present

## 2017-12-02 DIAGNOSIS — M7651 Patellar tendinitis, right knee: Secondary | ICD-10-CM | POA: Diagnosis not present

## 2017-12-05 ENCOUNTER — Telehealth: Payer: Self-pay | Admitting: Family Medicine

## 2017-12-05 DIAGNOSIS — Z79899 Other long term (current) drug therapy: Secondary | ICD-10-CM

## 2017-12-05 DIAGNOSIS — R7989 Other specified abnormal findings of blood chemistry: Secondary | ICD-10-CM

## 2017-12-05 DIAGNOSIS — E291 Testicular hypofunction: Secondary | ICD-10-CM

## 2017-12-05 DIAGNOSIS — I10 Essential (primary) hypertension: Secondary | ICD-10-CM

## 2017-12-05 DIAGNOSIS — M1000 Idiopathic gout, unspecified site: Secondary | ICD-10-CM

## 2017-12-05 DIAGNOSIS — Z13220 Encounter for screening for lipoid disorders: Secondary | ICD-10-CM

## 2017-12-05 NOTE — Telephone Encounter (Signed)
Sure,

## 2017-12-05 NOTE — Telephone Encounter (Signed)
I called and left a message to r/c. I have placed the orders in epic.Needs to be fasting.

## 2017-12-05 NOTE — Telephone Encounter (Signed)
Last had labs drawn 02/22/2017 Uric Acid,testosterone,bmet,hepatic fx,lipid. Same ? Please advise.

## 2017-12-05 NOTE — Telephone Encounter (Signed)
Pt has CPE scheduled for 01/05/18. He would like to get lab work before appt.

## 2017-12-06 NOTE — Telephone Encounter (Signed)
Pt returned call and verbalized understanding  

## 2017-12-12 DIAGNOSIS — Z79899 Other long term (current) drug therapy: Secondary | ICD-10-CM | POA: Diagnosis not present

## 2017-12-12 DIAGNOSIS — R7989 Other specified abnormal findings of blood chemistry: Secondary | ICD-10-CM | POA: Diagnosis not present

## 2017-12-12 DIAGNOSIS — I10 Essential (primary) hypertension: Secondary | ICD-10-CM | POA: Diagnosis not present

## 2017-12-12 DIAGNOSIS — M1000 Idiopathic gout, unspecified site: Secondary | ICD-10-CM | POA: Diagnosis not present

## 2017-12-12 DIAGNOSIS — E291 Testicular hypofunction: Secondary | ICD-10-CM | POA: Diagnosis not present

## 2017-12-13 LAB — BASIC METABOLIC PANEL
BUN / CREAT RATIO: 20 (ref 9–20)
BUN: 15 mg/dL (ref 6–24)
CALCIUM: 9.1 mg/dL (ref 8.7–10.2)
CO2: 27 mmol/L (ref 20–29)
CREATININE: 0.76 mg/dL (ref 0.76–1.27)
Chloride: 98 mmol/L (ref 96–106)
GFR calc Af Amer: 131 mL/min/{1.73_m2} (ref >59)
GFR calc non Af Amer: 113 mL/min/{1.73_m2} (ref >59)
GLUCOSE: 116 mg/dL — AB (ref 65–99)
Potassium: 4.4 mmol/L (ref 3.5–5.2)
Sodium: 142 mmol/L (ref 134–144)

## 2017-12-13 LAB — LIPID PANEL
CHOLESTEROL TOTAL: 288 mg/dL — AB (ref 100–199)
Chol/HDL Ratio: 4.6 ratio (ref 0.0–5.0)
HDL: 63 mg/dL (ref >39)
LDL CALC: 165 mg/dL — AB (ref 0–99)
Triglycerides: 300 mg/dL — ABNORMAL HIGH (ref 0–149)
VLDL Cholesterol Cal: 60 mg/dL — ABNORMAL HIGH (ref 5–40)

## 2017-12-13 LAB — TESTOSTERONE: TESTOSTERONE: 224 ng/dL — AB (ref 264–916)

## 2017-12-13 LAB — HEPATIC FUNCTION PANEL
ALBUMIN: 4.5 g/dL (ref 3.5–5.5)
ALK PHOS: 54 IU/L (ref 39–117)
ALT: 42 IU/L (ref 0–44)
AST: 26 IU/L (ref 0–40)
BILIRUBIN TOTAL: 0.8 mg/dL (ref 0.0–1.2)
BILIRUBIN, DIRECT: 0.18 mg/dL (ref 0.00–0.40)
TOTAL PROTEIN: 6.9 g/dL (ref 6.0–8.5)

## 2017-12-13 LAB — URIC ACID: URIC ACID: 7.7 mg/dL (ref 3.7–8.6)

## 2017-12-14 ENCOUNTER — Encounter: Payer: Self-pay | Admitting: Family Medicine

## 2017-12-27 DIAGNOSIS — M1A09X0 Idiopathic chronic gout, multiple sites, without tophus (tophi): Secondary | ICD-10-CM | POA: Diagnosis not present

## 2017-12-27 DIAGNOSIS — M25562 Pain in left knee: Secondary | ICD-10-CM | POA: Diagnosis not present

## 2017-12-27 DIAGNOSIS — M2550 Pain in unspecified joint: Secondary | ICD-10-CM | POA: Diagnosis not present

## 2018-01-02 DIAGNOSIS — M7651 Patellar tendinitis, right knee: Secondary | ICD-10-CM | POA: Diagnosis not present

## 2018-01-05 ENCOUNTER — Encounter: Payer: 59 | Admitting: Family Medicine

## 2018-01-25 DIAGNOSIS — M2550 Pain in unspecified joint: Secondary | ICD-10-CM | POA: Diagnosis not present

## 2018-01-25 DIAGNOSIS — M25562 Pain in left knee: Secondary | ICD-10-CM | POA: Diagnosis not present

## 2018-01-25 DIAGNOSIS — M1A09X0 Idiopathic chronic gout, multiple sites, without tophus (tophi): Secondary | ICD-10-CM | POA: Diagnosis not present

## 2018-01-31 ENCOUNTER — Encounter: Payer: 59 | Admitting: Family Medicine

## 2018-02-22 ENCOUNTER — Encounter: Payer: 59 | Admitting: Family Medicine

## 2018-02-27 ENCOUNTER — Other Ambulatory Visit: Payer: Self-pay | Admitting: Family Medicine

## 2018-02-27 NOTE — Telephone Encounter (Signed)
Ok six ref 

## 2018-03-01 ENCOUNTER — Telehealth: Payer: Self-pay | Admitting: *Deleted

## 2018-03-01 NOTE — Telephone Encounter (Signed)
Patient notified

## 2018-03-01 NOTE — Telephone Encounter (Signed)
Approval was received for testosterone through cover my meds. Left message to return call to let pt know and left message on presciber voicemail at [facility] [address] because I held for over and no one would pick up.

## 2018-03-07 ENCOUNTER — Ambulatory Visit (INDEPENDENT_AMBULATORY_CARE_PROVIDER_SITE_OTHER): Payer: 59 | Admitting: Family Medicine

## 2018-03-07 ENCOUNTER — Encounter: Payer: Self-pay | Admitting: Family Medicine

## 2018-03-07 VITALS — BP 126/74 | Ht 74.5 in | Wt 271.8 lb

## 2018-03-07 DIAGNOSIS — F321 Major depressive disorder, single episode, moderate: Secondary | ICD-10-CM

## 2018-03-07 DIAGNOSIS — E291 Testicular hypofunction: Secondary | ICD-10-CM

## 2018-03-07 DIAGNOSIS — I10 Essential (primary) hypertension: Secondary | ICD-10-CM

## 2018-03-07 DIAGNOSIS — Z0000 Encounter for general adult medical examination without abnormal findings: Secondary | ICD-10-CM | POA: Diagnosis not present

## 2018-03-07 DIAGNOSIS — Z23 Encounter for immunization: Secondary | ICD-10-CM

## 2018-03-07 DIAGNOSIS — R7989 Other specified abnormal findings of blood chemistry: Secondary | ICD-10-CM | POA: Diagnosis not present

## 2018-03-07 MED ORDER — BUPROPION HCL ER (SR) 150 MG PO TB12: ORAL_TABLET | ORAL | 5 refills | Status: DC

## 2018-03-07 MED ORDER — TESTOSTERONE CYPIONATE 200 MG/ML IM SOLN: INTRAMUSCULAR | 2 refills | Status: DC

## 2018-03-07 NOTE — Progress Notes (Signed)
Subjective:    Patient ID: Ryan Gibson, male    DOB: 1975-08-21, 43 y.o.   MRN: [id number]  HPI The patient comes in today for a wellness visit.    A review of their health history was completed.  A review of medications was also completed.  Any needed refills; testosterone  Eating habits: health conscious  Falls/  MVA accidents in past few months: none  Regular exercise: walk alot  Specialist pt sees on regular basis: none  Preventative health issues were discussed.   Additional concerns: pt states pharm is not giving him enough testosterone to last one month.   sTill seeing the rheumatologist,mainly focusing on the gout, pt also has rheum factor , pt now on colcicine and   Pt running out of his testosterone, with two vials every 14 days   Pt is short a dose  Pt ordered to take 200 mg every seven days, but running out early   Pt took wellbutrin helped some   Review of Systems  Constitutional: Negative for activity change, appetite change and fever.  HENT: Negative for congestion and rhinorrhea.   Eyes: Negative for discharge.  Respiratory: Negative for cough and wheezing.   Cardiovascular: Negative for chest pain.  Gastrointestinal: Negative for abdominal pain, blood in stool and vomiting.  Genitourinary: Negative for difficulty urinating and frequency.  Musculoskeletal: Negative for neck pain.  Skin: Negative for rash.  Allergic/Immunologic: Negative for environmental allergies and food allergies.  Neurological: Negative for weakness and headaches.  Psychiatric/Behavioral: Negative for agitation.  All other systems reviewed and are negative.      Objective:   Physical Exam Vitals signs reviewed.  Constitutional:      Appearance: He is well-developed.  HENT:     Head: Normocephalic and atraumatic.     Right Ear: External ear normal.     Left Ear: External ear normal.     Nose: Nose normal.  Eyes:     Pupils: Pupils are equal, round, and  reactive to light.  Neck:     Musculoskeletal: Normal range of motion and neck supple.     Thyroid: No thyromegaly.  Cardiovascular:     Rate and Rhythm: Normal rate and regular rhythm.     Heart sounds: Normal heart sounds. No murmur.  Pulmonary:     Effort: Pulmonary effort is normal. No respiratory distress.     Breath sounds: Normal breath sounds. No wheezing.  Abdominal:     General: Bowel sounds are normal. There is no distension.     Palpations: Abdomen is soft. There is no mass.     Tenderness: There is no abdominal tenderness.  Genitourinary:    Penis: Normal.   Musculoskeletal: Normal range of motion.     Comments: Chronic venous stasis changes in both legs variceal veins left leg  Lymphadenopathy:     Cervical: No cervical adenopathy.  Skin:    General: Skin is warm and dry.     Findings: No erythema.  Neurological:     Mental Status: He is alert.     Motor: No abnormal muscle tone.  Psychiatric:        Behavior: Behavior normal.        Judgment: Judgment normal.           Assessment & Plan:  Impression 1 wellness exam.  Diet discussed.  Exercise discussed.  Blood work discussed.  Patient's cholesterol is elevated.  See below vaccines discussed.  Declines flu.  Will take the  Tdap  2.  Hyperlipidemia.  Medication discussed.  Patient not interested in that.  Will work harder on diet  3.  Gout.  Now followed by rheumatologist in this regard  4.  Depression.  Chronic in nature discussed.  Wellbutrin helped in the past.  Will resume this.  No suicidal thoughts  5.  Testosterone deficiency.  Patient taking 1 cc vial weekly but is running out of medicines prematurely.  We will try to authorize a full 1 months worth.  Rationale discussed with patient.  Dosage being adjusted on prescription/follow-up testosterone in 3 months follow-up office visit in 6 months  Follow-up in 6 months

## 2018-11-13 ENCOUNTER — Other Ambulatory Visit: Payer: Self-pay | Admitting: Family Medicine

## 2018-12-12 ENCOUNTER — Other Ambulatory Visit: Payer: Self-pay

## 2018-12-12 ENCOUNTER — Encounter: Payer: Self-pay | Admitting: Family Medicine

## 2018-12-12 ENCOUNTER — Ambulatory Visit (INDEPENDENT_AMBULATORY_CARE_PROVIDER_SITE_OTHER): Payer: BC Managed Care – PPO | Admitting: Family Medicine

## 2018-12-12 DIAGNOSIS — M1000 Idiopathic gout, unspecified site: Secondary | ICD-10-CM

## 2018-12-12 MED ORDER — PREDNISONE 20 MG PO TABS: ORAL_TABLET | ORAL | 0 refills | Status: DC

## 2018-12-12 MED ORDER — ALLOPURINOL 300 MG PO TABS: 300.0000 mg | ORAL_TABLET | Freq: Every day | ORAL | 3 refills | Status: DC

## 2018-12-12 NOTE — Progress Notes (Signed)
Subjective:  Audio  Patient ID: Ryan Gibson, male    DOB: 15-Jul-1975, 43 y.o.   MRN: [id number]  HPI  Patient calls with flare of gout. Patient states he is having pain in his left hand and elbow and right great toe. Patient states he is getting where it is painful to walk. The colchicine is over $200 with his new insurance and was needing prednisone till he can get back in with his specialist to change his med.  Virtual Visit via Video Note  I connected with [name] on 12/12/18 at 10:00 AM EDT by a video enabled telemedicine application and verified that I am speaking with the correct person using two identifiers.  Location: Patient: home Provider: office    I discussed the limitations of evaluation and management by telemedicine and the availability of in person appointments. The patient expressed understanding and agreed to proceed.  History of Present Illness:    Observations/Objective:   Assessment and Plan:   Follow Up Instructions:    I discussed the assessment and treatment plan with the patient. The patient was provided an opportunity to ask questions and all were answered. The patient agreed with the plan and demonstrated an understanding of the instructions.   The patient was advised to call back or seek an in-person evaluation if the symptoms worsen or if the condition fails to improve as anticipated.  I provided 18 minutes of non-face-to-face time during this encounter.      Review of Systems No headache, no major weight loss or weight gain, no chest pain no back pain abdominal pain no change in bowel habits complete ROS otherwise negative     Objective:   Physical Exam   Virtual     Assessment & Plan:  Impression flare of gout.  Saw a rheumatologist.  Put him on a couple of expensive medications because of $300 per month he simply cannot afford.  Go back to all regimen about allopurinol 300 mg daily.  Along with no prednisone  taper for this flare rationale discussed follow-up with specialist

## 2018-12-12 NOTE — Addendum Note (Signed)
Addended by: Carmelina Noun on: 12/12/2018 11:20 AM   Modules accepted: Orders

## 2018-12-24 ENCOUNTER — Ambulatory Visit (INDEPENDENT_AMBULATORY_CARE_PROVIDER_SITE_OTHER): Payer: BC Managed Care – PPO | Admitting: Family Medicine

## 2018-12-24 ENCOUNTER — Encounter: Payer: Self-pay | Admitting: Family Medicine

## 2018-12-24 ENCOUNTER — Other Ambulatory Visit: Payer: Self-pay

## 2018-12-24 DIAGNOSIS — L03114 Cellulitis of left upper limb: Secondary | ICD-10-CM

## 2018-12-24 DIAGNOSIS — M1000 Idiopathic gout, unspecified site: Secondary | ICD-10-CM

## 2018-12-24 DIAGNOSIS — M7022 Olecranon bursitis, left elbow: Secondary | ICD-10-CM | POA: Diagnosis not present

## 2018-12-24 MED ORDER — CEPHALEXIN 500 MG PO CAPS: 500.0000 mg | ORAL_CAPSULE | Freq: Three times a day (TID) | ORAL | 0 refills | Status: DC

## 2018-12-24 MED ORDER — COLCHICINE 0.6 MG PO TABS: 0.6000 mg | ORAL_TABLET | Freq: Two times a day (BID) | ORAL | 5 refills | Status: DC

## 2018-12-24 MED ORDER — DOXYCYCLINE HYCLATE 100 MG PO TABS: 100.0000 mg | ORAL_TABLET | Freq: Two times a day (BID) | ORAL | 0 refills | Status: DC

## 2018-12-24 MED ORDER — PREDNISONE 20 MG PO TABS: ORAL_TABLET | ORAL | 0 refills | Status: DC

## 2018-12-24 NOTE — Progress Notes (Signed)
Subjective:  Audio plus video  Patient ID: Ryan Gibson, male    DOB: 1975/06/12, 43 y.o.   MRN: [id number]  HPI Pt is having elbow pain with swelling, redness and tightness going to the fore arm. Pt states this started about 6 days ago while he was on Prednisone for a gout flare up. Pt states last night the pain was really bad but the horrible pain has subsided. 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 Pt states he has been running a low grade fever and just not feeling well. Pt would like refill on Hydrocodone.   Virtual Visit via Video Note  I connected with Hector Foppiano on 12/24/18 at  3:00 PM EST by a video enabled telemedicine application and verified that I am speaking with the correct person using two identifiers.  Location: Patient: home Provider: office   I discussed the limitations of evaluation and management by telemedicine and the availability of in person appointments. The patient expressed understanding and agreed to proceed.  History of Present Illness:    Observations/Objective:   Assessment and Plan:   Follow Up Instructions:    I discussed the assessment and treatment plan with the patient. The patient was provided an opportunity to ask questions and all were answered. The patient agreed with the plan and demonstrated an understanding of the instructions.   The patient was advised to call back or seek an in-person evaluation if the symptoms worsen or if the condition fails to improve as anticipated.  I provided 20 minutes of non-face-to-face time during this encounter.   Marlowe Shores, LPN  Positive history of gout.  Left elbow became more more swollen.  Painful.  Now redness and swelling through substantial part of forearm.  Somewhat tender.  No obvious fever or chills  Review of Systems No headache no chest pain no shortness of breath    Objective:   Physical Exam  Virtual  left arm impressive with swollen erythematous with pain and swelling.  Good ability to flex arm some posterior swelling      Assessment & Plan:  Impression gout/olecranon bursitis with question element of secondary cellulitis plan antibiotics prescribed.  Family to try to get back on colchicine as originally started by specialist will initiate

## 2019-01-03 ENCOUNTER — Emergency Department (HOSPITAL_COMMUNITY)
Admission: EM | Admit: 2019-01-03 | Discharge: 2019-01-03 | Disposition: A | Discharge status: Home / Self Care | Payer: BC Managed Care – PPO | Attending: Emergency Medicine | Admitting: Emergency Medicine

## 2019-01-03 ENCOUNTER — Encounter (HOSPITAL_COMMUNITY): Payer: Self-pay | Admitting: Emergency Medicine

## 2019-01-03 ENCOUNTER — Telehealth: Payer: Self-pay | Admitting: Family Medicine

## 2019-01-03 ENCOUNTER — Other Ambulatory Visit: Payer: Self-pay

## 2019-01-03 DIAGNOSIS — F17220 Nicotine dependence, chewing tobacco, uncomplicated: Secondary | ICD-10-CM | POA: Diagnosis not present

## 2019-01-03 DIAGNOSIS — I10 Essential (primary) hypertension: Secondary | ICD-10-CM | POA: Diagnosis not present

## 2019-01-03 DIAGNOSIS — L03114 Cellulitis of left upper limb: Secondary | ICD-10-CM | POA: Diagnosis not present

## 2019-01-03 DIAGNOSIS — L539 Erythematous condition, unspecified: Secondary | ICD-10-CM | POA: Diagnosis not present

## 2019-01-03 LAB — BASIC METABOLIC PANEL
Anion gap: 9 (ref 5–15)
BUN: 17 mg/dL (ref 6–20)
CO2: 27 mmol/L (ref 22–32)
Calcium: 9.3 mg/dL (ref 8.9–10.3)
Chloride: 103 mmol/L (ref 98–111)
Creatinine, Ser: 0.82 mg/dL (ref 0.61–1.24)
GFR calc Af Amer: >60 mL/min (ref >60)
GFR calc non Af Amer: >60 mL/min (ref >60)
Glucose, Bld: 105 mg/dL — ABNORMAL HIGH (ref 70–99)
Potassium: 4.3 mmol/L (ref 3.5–5.1)
Sodium: 139 mmol/L (ref 135–145)

## 2019-01-03 LAB — CBC WITH DIFFERENTIAL/PLATELET
Abs Immature Granulocytes: 0.06 10*3/uL (ref 0.00–0.07)
Basophils Absolute: 0 10*3/uL (ref 0.0–0.1)
Basophils Relative: 0 %
Eosinophils Absolute: 0.1 10*3/uL (ref 0.0–0.5)
Eosinophils Relative: 1 %
HCT: 45 % (ref 39.0–52.0)
Hemoglobin: 15.7 g/dL (ref 13.0–17.0)
Immature Granulocytes: 0 %
Lymphocytes Relative: 27 %
Lymphs Abs: 3.7 10*3/uL (ref 0.7–4.0)
MCH: 33.7 pg (ref 26.0–34.0)
MCHC: 34.9 g/dL (ref 30.0–36.0)
MCV: 96.6 fL (ref 80.0–100.0)
Monocytes Absolute: 0.9 10*3/uL (ref 0.1–1.0)
Monocytes Relative: 7 %
Neutro Abs: 8.8 10*3/uL — ABNORMAL HIGH (ref 1.7–7.7)
Neutrophils Relative %: 65 %
Platelets: 263 10*3/uL (ref 150–400)
RBC: 4.66 MIL/uL (ref 4.22–5.81)
RDW: 12.5 % (ref 11.5–15.5)
WBC: 13.6 10*3/uL — ABNORMAL HIGH (ref 4.0–10.5)
nRBC: 0 % (ref 0.0–0.2)

## 2019-01-03 MED ORDER — DOXYCYCLINE HYCLATE 100 MG PO CAPS: 100.0000 mg | ORAL_CAPSULE | Freq: Two times a day (BID) | ORAL | 0 refills | Status: DC

## 2019-01-03 MED ORDER — CLINDAMYCIN PHOSPHATE 600 MG/50ML IV SOLN
600.0000 mg | Freq: Once | INTRAVENOUS | Status: AC
  Administered 2019-01-03: 600 mg via INTRAVENOUS
  Filled 2019-01-03: qty 50

## 2019-01-03 NOTE — ED Provider Notes (Signed)
[facility] EMERGENCY DEPARTMENT Provider Note   CSN: [id number] Arrival date & time: 01/03/19  1308       History   Chief Complaint Chief Complaint  Patient presents with  . Abscess    HPI Ryan Gibson is a 43 y.o. male.     HPI   Ryan Gibson is a 43 y.o. male who presents to the Emergency Department complaining of persistent pain, swelling, and redness of his left forearm.  Symptoms have been present for more than 2 weeks.  He noticed swelling and pain to his posterior forearm with redness that extended to his wrist at onset.  He was seen by his PCP who prescribed Keflex and doxycycline.  He has been taking these medications for 7 days.  He notes improvement of the redness and pain, but continues to have some localized swelling of the proximal forearm.  He denies known injury although he states he is left-hand dominant and continues to "bump" his elbow on things during his workday.  Pain to the forearm is reproduced with extension.  He denies fever, chills, numbness or tingling of his hands or fingers.    Past Medical History:  Diagnosis Date  . Asthma    AS A CHILD ONLY  . Foot pain, right    painful x 1 month and swelling    Patient Active Problem List   Diagnosis Date Noted  . Depression, major, single episode, moderate ([place]) 03/07/2018  . Hypogonadism male 11/24/2015  . Gout 11/01/2014  . Essential hypertension 03/10/2014  . Right inguinal hernia 06/28/2011    Past Surgical History:  Procedure Laterality Date  . HERNIA REPAIR  07/13/2011   RIH  . INGUINAL HERNIA REPAIR  07/13/2011   Procedure: HERNIA REPAIR INGUINAL ADULT;  Surgeon: Imogene Burn. Georgette Dover, MD;  Location: [facility] ORS;  Service: General;  Laterality: Right;  . NO PREVIOUS SURGERY          Home Medications    Prior to Admission medications   Medication Sig Start Date End Date Taking? Authorizing Provider  allopurinol (ZYLOPRIM) 300 MG tablet Take 1 tablet (300 mg total) by mouth  daily. 12/12/18   Mikey Kirschner, MD  buPROPion [facility] SR) 150 MG 12 hr tablet Take one qam for 3 days, then one bid Patient not taking: Reported on 12/24/2018 03/07/18   Mikey Kirschner, MD  cephALEXin (KEFLEX) 500 MG capsule Take 1 capsule (500 mg total) by mouth 3 (three) times daily. 12/24/18   Mikey Kirschner, MD  colchicine 0.6 MG tablet Take 1 tablet (0.6 mg total) by mouth 2 (two) times daily. Until relief from gout flare Patient not taking: Reported on 12/12/2018 10/27/17   Mikey Kirschner, MD  colchicine 0.6 MG tablet Take 1 tablet (0.6 mg total) by mouth 2 (two) times daily. 12/24/18   Mikey Kirschner, MD  doxycycline (VIBRA-TABS) 100 MG tablet Take 1 tablet (100 mg total) by mouth 2 (two) times daily. 12/24/18   Mikey Kirschner, MD  HYDROcodone-acetaminophen (NORCO/VICODIN) 5-325 MG tablet Take on tablet every 6 hours as needed for pain Patient not taking: Reported on 12/24/2018 08/14/17   Mikey Kirschner, MD  predniSONE (DELTASONE) 20 MG tablet Take 3 qd for 3 days, then 2 qd for 3 days, then 1 qd for 3 days Patient not taking: Reported on 12/24/2018 12/12/18   Mikey Kirschner, MD  predniSONE (DELTASONE) 20 MG tablet Take 3 qd for 3 days, then 2 qd for 3  days, then one qd for 3 days 12/24/18   Merlyn AlbertLuking, William S, MD  testosterone cypionate (DEPOTESTOSTERONE CYPIONATE) 200 MG/ML injection INJECT 1 ML WEEKLY 11/13/18   Merlyn AlbertLuking, William S, MD    Family History Family History  Problem Relation Age of Onset  . Heart disease Father     Social History Social History   Tobacco Use  . Smoking status: Never Smoker  . Smokeless tobacco: Current User    Types: Chew  Substance Use Topics  . Alcohol use: Yes    Comment: Social  . Drug use: No     Allergies   Vancomycin   Review of Systems Review of Systems  Constitutional: Negative for chills and fever.  Respiratory: Negative for shortness of breath.   Cardiovascular: Negative for chest pain.  Gastrointestinal:  Negative for nausea and vomiting.  Musculoskeletal: Positive for arthralgias (Pain redness and swelling of his left forearm). Negative for joint swelling and neck pain.  Skin: Negative for color change and wound.  Neurological: Negative for weakness and numbness.  All other systems reviewed and are negative.    Physical Exam Updated Vital Signs BP (!) 166/108 (BP Location: Right Arm)   Pulse (!) 108   Temp 98.7 F (37.1 C) (Oral)   Resp 17   Ht 6\' 2"  (1.88 m)   Wt 73.5 kg   SpO2 98%   BMI 20.80 kg/m   Physical Exam Vitals signs and nursing note reviewed.  Constitutional:      General: He is not in acute distress.    Appearance: Normal appearance. He is well-developed. He is not ill-appearing.  HENT:     Head: Atraumatic.  Neck:     Musculoskeletal: Normal range of motion. No muscular tenderness.  Cardiovascular:     Rate and Rhythm: Regular rhythm. Tachycardia present.     Pulses: Normal pulses.  Pulmonary:     Effort: Pulmonary effort is normal.     Breath sounds: Normal breath sounds.  Musculoskeletal:        General: Tenderness present.     Comments: Focal area of erythema to the proximal left forearm without frank abscess.  Fluctuance noted to the left olecranon bursa, w/o erythema.  See photo  Lymphadenopathy:     Cervical: No cervical adenopathy.  Skin:    General: Skin is warm.     Capillary Refill: Capillary refill takes less than 2 seconds.     Findings: No rash.  Neurological:     General: No focal deficit present.     Mental Status: He is alert.     Sensory: No sensory deficit.     Motor: No weakness or abnormal muscle tone.     Coordination: Coordination normal.        ED Treatments / Results  Labs (all labs ordered are listed, but only abnormal results are displayed) Labs Reviewed  BASIC METABOLIC PANEL - Abnormal; Notable for the following components:      Result Value   Glucose, Bld 105 (*)    All other components within normal limits   CBC WITH DIFFERENTIAL/PLATELET - Abnormal; Notable for the following components:   WBC 13.6 (*)    Neutro Abs 8.8 (*)    All other components within normal limits    EKG None  Radiology No results found.  Procedures Procedures (including critical care time)  Medications Ordered in ED Medications - No data to display   Initial Impression / Assessment and Plan / ED Course  I have  reviewed the triage vital signs and the nursing notes.  Pertinent labs & imaging results that were available during my care of the patient were reviewed by me and considered in my medical decision making (see chart for details).        Pt with likely cellulitis of the left forearm.  I do not feel that this represents a septic joint.  Patient is well-appearing and afebrile.  He has full range of motion of the elbow.  Symptoms appear to be distal to the joint.  There is no evidence of a abscess at this time.  On further history, patient noted that he completed a round of steroids yesterday.  This may explain the mildly elevated white count. I feel he is appropriate for d/c home, pt noted to be hypertensive w/o hx.  No associated sx's.  Counseled pt on importance of close PCP f/u regarding this and he agrees to arrange f/u    Pt declined XR of the elbow. Given IV abx here.  Will extend his rx for doxycycline and he agrees to close f/u with orthopedics or ER return if sx's not improving.     Final Clinical Impressions(s) / ED Diagnoses   Final diagnoses:  Cellulitis of left upper extremity  Hypertension, unspecified type    ED Discharge Orders    None       Pauline Aus, PA-C 01/04/19 Kizzie Fantasia, MD 01/08/19 1559

## 2019-01-03 NOTE — Telephone Encounter (Signed)
Pt is calling in stating his arm is still swollen and warm to touch. Yesterday it was throbbing all day today is not as bad. Today it is a tender. He is not running a fever. Is at work today and said he can come in tomorrow if needed.

## 2019-01-03 NOTE — ED Triage Notes (Signed)
Pt c/o of abscess on his left arm x 2 weeks.  Swelling, redness noted.

## 2019-01-03 NOTE — Telephone Encounter (Signed)
Consult with Dr Steve-Dr Richardson Landry sated patient is on maximum out patient treatment for this and needs to go to the Er for evaluation and IV antibiotics. Patient was notified and verbalized understanding.

## 2019-01-03 NOTE — Discharge Instructions (Addendum)
Elevate your left arm as possible.  Warm water soaks or compresses 2-3 times a day.  Start the doxycycline in 1 to 2 days if the redness of your forearm is not improving.  Call Dr. Ruthe Mannan office to arrange a follow-up appointment if not improving.  Also, your blood pressure today is elevated.  You will need to follow-up with Dr. Lance Sell office regarding this. Return to ER for any worsening symptoms

## 2019-03-25 ENCOUNTER — Encounter: Payer: Self-pay | Admitting: Family Medicine

## 2019-06-18 ENCOUNTER — Telehealth: Payer: Self-pay | Admitting: Family Medicine

## 2019-06-18 NOTE — Telephone Encounter (Signed)
Pt would like to know if he is able to get something sent in for gout flare up. He has an appt on 5/3 with Dr. Richardson Landry but hoping something can be sent in until that appt is met.   [id number] - [place], [place] AT [place]

## 2019-06-18 NOTE — Telephone Encounter (Signed)
Called and discussed with pt. Pt very upset nothing could be called in. Offered appt tomorrow. States he is working and cannot come in. I told him he could go to urgent care in [place] they are open til 8pm today and take walk ins.

## 2019-06-18 NOTE — Telephone Encounter (Signed)
Pt calling checking on status

## 2019-06-18 NOTE — Telephone Encounter (Signed)
Pt seen 11/2 for gout in elbow and states it flared up again this past Sunday. States he can hardly make a fist now. Tried ibuprofen 800mg  and it barley touches the pain. He was on allopurinol 300mg  daily but has been out of meds for a few months. And he also ran out of colchicine that he takes for flare ups.

## 2019-06-19 ENCOUNTER — Other Ambulatory Visit: Payer: Self-pay | Admitting: Family Medicine

## 2019-06-19 MED ORDER — COLCHICINE 0.6 MG PO TABS: 0.6000 mg | ORAL_TABLET | Freq: Two times a day (BID) | ORAL | 0 refills | Status: DC

## 2019-06-19 NOTE — Telephone Encounter (Signed)
03/07/18 had wellness and discussed testertone

## 2019-06-19 NOTE — Telephone Encounter (Signed)
Small course of colchicine called in.  Pt needs appt to be seen or video visit with pcp since over 6 months since last visit.   Thx,   Dr. Ladona Ridgel

## 2019-06-19 NOTE — Telephone Encounter (Signed)
Pt.notified

## 2019-06-19 NOTE — Addendum Note (Signed)
Addended by: Annalee Genta on: 06/19/2019 10:35 AM   Modules accepted: Orders

## 2019-06-24 ENCOUNTER — Encounter: Payer: Self-pay | Admitting: Family Medicine

## 2019-06-24 ENCOUNTER — Other Ambulatory Visit: Payer: Self-pay

## 2019-06-24 ENCOUNTER — Ambulatory Visit (INDEPENDENT_AMBULATORY_CARE_PROVIDER_SITE_OTHER): Payer: BC Managed Care – PPO | Admitting: Family Medicine

## 2019-06-24 VITALS — BP 136/86 | Temp 97.0°F | Ht 74.0 in | Wt 256.4 lb

## 2019-06-24 DIAGNOSIS — M1000 Idiopathic gout, unspecified site: Secondary | ICD-10-CM

## 2019-06-24 DIAGNOSIS — M7022 Olecranon bursitis, left elbow: Secondary | ICD-10-CM

## 2019-06-24 MED ORDER — PREDNISONE 20 MG PO TABS: ORAL_TABLET | ORAL | 0 refills | Status: DC

## 2019-06-24 MED ORDER — DICLOFENAC SODIUM 75 MG PO TBEC
75.0000 mg | DELAYED_RELEASE_TABLET | Freq: Two times a day (BID) | ORAL | 0 refills | Status: DC
Start: 2019-06-24 — End: 2019-07-23

## 2019-06-24 MED ORDER — COLCHICINE 0.6 MG PO TABS
0.6000 mg | ORAL_TABLET | Freq: Two times a day (BID) | ORAL | 4 refills | Status: DC
Start: 2019-06-24 — End: 2020-02-22

## 2019-06-24 NOTE — Progress Notes (Signed)
Subjective:    Patient ID: Ryan Gibson, male    DOB: 1975-09-08, 44 y.o.   MRN: [id number]  HPI  Patient arrives for a flare of gout. Patient states he needs papers-FMLA filled out for work for gout. Patient also has a large knot on his left elbow.  Has been seen in rheumatologist at [facility] rheumatology for his scalp.  Could not get a visit soon.  Requests our evaluation.  Has stopped his allopurinol.  He felt it made things worse  States colchicine definitely helpful would like to maintain.  Also would like to maintain diclofenac  In addition has resolved right hand inflammation from a flare Review of Systems     Objective:   Physical Exam  Alert vitals stable, NAD. Blood pressure good on repeat. HEENT normal. Lungs clear. Heart regular rate and rhythm. Right wrist somewhat warm and swollen      Assessment & Plan:  Impression flare of gout.  Prednisone taper.  Will refill colchicine.  And diclofenac.

## 2019-07-01 DIAGNOSIS — Z029 Encounter for administrative examinations, unspecified: Secondary | ICD-10-CM

## 2019-07-23 ENCOUNTER — Other Ambulatory Visit: Payer: Self-pay | Admitting: Family Medicine

## 2019-08-05 ENCOUNTER — Ambulatory Visit: Payer: BC Managed Care – PPO | Admitting: Family Medicine

## 2019-09-19 ENCOUNTER — Telehealth: Payer: Self-pay | Admitting: Family Medicine

## 2019-09-19 NOTE — Telephone Encounter (Signed)
Please contact patient to set up appt. Thank you 

## 2019-09-19 NOTE — Telephone Encounter (Signed)
Scheduled 9/3 °

## 2019-09-19 NOTE — Telephone Encounter (Signed)
Pt needs appt to f/u and establish care.   Small amt given till pt can get appt.  Dr.T

## 2019-10-11 ENCOUNTER — Other Ambulatory Visit: Payer: Self-pay | Admitting: Family Medicine

## 2019-10-25 ENCOUNTER — Ambulatory Visit: Payer: BC Managed Care – PPO | Admitting: Family Medicine

## 2019-10-30 DIAGNOSIS — M546 Pain in thoracic spine: Secondary | ICD-10-CM | POA: Diagnosis not present

## 2019-10-30 DIAGNOSIS — M9901 Segmental and somatic dysfunction of cervical region: Secondary | ICD-10-CM | POA: Diagnosis not present

## 2019-10-30 DIAGNOSIS — M9902 Segmental and somatic dysfunction of thoracic region: Secondary | ICD-10-CM | POA: Diagnosis not present

## 2019-10-30 DIAGNOSIS — M542 Cervicalgia: Secondary | ICD-10-CM | POA: Diagnosis not present

## 2019-11-15 ENCOUNTER — Ambulatory Visit
Admission: EM | Admit: 2019-11-15 | Discharge: 2019-11-15 | Disposition: A | Discharge status: Home / Self Care | Payer: BC Managed Care – PPO

## 2019-11-15 DIAGNOSIS — R1013 Epigastric pain: Secondary | ICD-10-CM | POA: Diagnosis not present

## 2019-11-15 NOTE — ED Triage Notes (Signed)
Pain in epigastric area that is worse with movement. Pt  thinks he may have hernia.

## 2019-11-15 NOTE — ED Provider Notes (Signed)
[facility]   [id number] 11/15/19 Arrival Time: 1654  Chief Complaint  Patient presents with  . Abdominal Pain     SUBJECTIVE:  Ryan Gibson is a 44 y.o. male who presented to the urgent care with a concern of epigastric hernia for the past few days.  Stays here to get a work note so he can see his PCP to refer him to GI.  Denies a precipitating.  Localized pain to epigastric area.  Described as persistent and achy to touch.  Has tried OTC medication without relief.  Symptom are worse at nighttime.  Reports similar symptoms in the past.  Denies fever, fever, nausea, vomiting, diarrhea  No LMP for male patient.  ROS: As per HPI.  All other pertinent ROS negative.     Past Medical History:  Diagnosis Date  . Asthma    AS A CHILD ONLY  . Foot pain, right    painful x 1 month and swelling   Past Surgical History:  Procedure Laterality Date  . HERNIA REPAIR  07/13/2011   RIH  . INGUINAL HERNIA REPAIR  07/13/2011   Procedure: HERNIA REPAIR INGUINAL ADULT;  Surgeon: Wilmon Arms. Corliss Skains, MD;  Location: [facility];  Service: General;  Laterality: Right;  . NO PREVIOUS SURGERY     Allergies  Allergen Reactions  . Vancomycin Hives    [id number] post op on R arm -site of infusion, and whole body itching   No current facility-administered medications on file prior to encounter.   Current Outpatient Medications on File Prior to Encounter  Medication Sig Dispense Refill  . allopurinol (ZYLOPRIM) 300 MG tablet Take 1 tablet (300 mg total) by mouth daily. (Patient not taking: Reported on 06/24/2019) 30 tablet 3  . buPROPion (WELLBUTRIN SR) 150 MG 12 hr tablet Take one qam for 3 days, then one bid (Patient not taking: Reported on 12/24/2018) 60 tablet 5  . cephALEXin (KEFLEX) 500 MG capsule Take 1 capsule (500 mg total) by mouth 3 (three) times daily. (Patient not taking: Reported on 06/24/2019) 30 capsule 0  . colchicine 0.6 MG tablet Take 1 tablet (0.6 mg total) by mouth 2 (two) times  daily. 20 tablet 0  . colchicine 0.6 MG tablet Take 1 tablet (0.6 mg total) by mouth 2 (two) times daily. 60 tablet 4  . diclofenac (VOLTAREN) 75 MG EC tablet TAKE 1 TABLET(75 MG) BY MOUTH TWICE DAILY 30 tablet 5  . doxycycline (VIBRAMYCIN) 100 MG capsule Take 1 capsule (100 mg total) by mouth 2 (two) times daily. (Patient not taking: Reported on 06/24/2019) 20 capsule 0  . HYDROcodone-acetaminophen (NORCO/VICODIN) 5-325 MG tablet Take on tablet every 6 hours as needed for pain (Patient not taking: Reported on 12/24/2018) 28 tablet 0  . predniSONE (DELTASONE) 20 MG tablet Take 3 qd for 3 days, then 2 qd for 3 days, then one qd for 3 days (Patient not taking: Reported on 06/24/2019) 18 tablet 0  . predniSONE (DELTASONE) 20 MG tablet Take 3 qd for 3 days, then 2 qd for 3 days, then 1 qd for 3 days 18 tablet 0  . testosterone cypionate (DEPOTESTOSTERONE CYPIONATE) 200 MG/ML injection INJECT 1 ML WEEKLY 4 mL 3   Social History   Socioeconomic History  . Marital status: Married    Spouse name: Not on file  . Number of children: Not on file  . Years of education: Not on file  . Highest education level: Not on file  Occupational History  . Not  on file  Tobacco Use  . Smoking status: Never Smoker  . Smokeless tobacco: Current User    Types: Chew  Substance and Sexual Activity  . Alcohol use: Yes    Comment: Social  . Drug use: No  . Sexual activity: Not on file  Other Topics Concern  . Not on file  Social History Narrative  . Not on file   Social Determinants of Health   Financial Resource Strain:   . Difficulty of Paying Living Expenses: Not on file  Food Insecurity:   . Worried About Programme researcher, broadcasting/film/video in the Last Year: Not on file  . Ran Out of Food in the Last Year: Not on file  Transportation Needs:   . Lack of Transportation (Medical): Not on file  . Lack of Transportation (Non-Medical): Not on file  Physical Activity:   . Days of Exercise per Week: Not on file  . Minutes of  Exercise per Session: Not on file  Stress:   . Feeling of Stress : Not on file  Social Connections:   . Frequency of Communication with Friends and Family: Not on file  . Frequency of Social Gatherings with Friends and Family: Not on file  . Attends Religious Services: Not on file  . Active Member of Clubs or Organizations: Not on file  . Attends Banker Meetings: Not on file  . Marital Status: Not on file  Intimate Partner Violence:   . Fear of Current or Ex-Partner: Not on file  . Emotionally Abused: Not on file  . Physically Abused: Not on file  . Sexually Abused: Not on file   Family History  Problem Relation Age of Onset  . Heart disease Father      OBJECTIVE:  Vitals:   11/15/19 1755  BP: 132/84  Pulse: 92  Resp: 16  Temp: 98.6 F (37 C)  SpO2: 98%    General appearance: Alert; NAD HEENT: NCAT.  Oropharynx clear.  Lungs: clear to auscultation bilaterally without adventitious breath sounds Heart: regular rate and rhythm.  Radial pulses 2+ symmetrical bilaterally Abdomen: soft, non-distended; normal active bowel sounds; non-tender to light and deep palpation; tenderness to touch in epigastric area, nontender at McBurney's point; negative Murphy's sign; negative rebound; no guarding Back: no CVA tenderness Extremities: no edema; symmetrical with no gross deformities Skin: warm and dry Neurologic: normal gait Psychological: alert and cooperative; normal mood and affect  LABS: No results found for this or any previous visit (from the past 24 hour(s)).  DIAGNOSTIC STUDIES: No results found.   ASSESSMENT & PLAN:  1. Abdominal pain, epigastric     No orders of the defined types were placed in this encounter.  Patient is stable for discharge.  Physical exam was otherwise normal and did not reveal presents of hernia.  Patient was advised to follow PCP for further reevaluation possible referral to GI  Discharge instructions  Follow-up with PCP for  possible referral to GI Alternate ibuprofen and Tylenol as needed for pain management Work note was provided until PCP appointment  Return or go to ED for worsening of symptoms   Reviewed expectations re: course of current medical issues. Questions answered. Outlined signs and symptoms indicating need for more acute intervention. Patient verbalized understanding. After Visit Summary given.   Durward Parcel, FNP 11/15/19 1909

## 2019-11-15 NOTE — Discharge Instructions (Signed)
Follow-up with PCP for possible referral to GI Alternate ibuprofen and Tylenol as needed for pain management Work note was provided until PCP appointment  Return or go to ED for worsening of symptoms

## 2019-11-20 ENCOUNTER — Ambulatory Visit (INDEPENDENT_AMBULATORY_CARE_PROVIDER_SITE_OTHER): Payer: BC Managed Care – PPO | Admitting: Family Medicine

## 2019-11-20 ENCOUNTER — Telehealth: Payer: Self-pay

## 2019-11-20 ENCOUNTER — Encounter: Payer: Self-pay | Admitting: Family Medicine

## 2019-11-20 ENCOUNTER — Other Ambulatory Visit: Payer: Self-pay

## 2019-11-20 VITALS — BP 112/78 | HR 107 | Temp 98.5°F | Ht 74.0 in | Wt 264.0 lb

## 2019-11-20 DIAGNOSIS — R1013 Epigastric pain: Secondary | ICD-10-CM | POA: Diagnosis not present

## 2019-11-20 MED ORDER — OMEPRAZOLE 40 MG PO CPDR: 40.0000 mg | DELAYED_RELEASE_CAPSULE | Freq: Every day | ORAL | 1 refills | Status: DC

## 2019-11-20 NOTE — Patient Instructions (Addendum)
Omeprazole daily.  Get mylanta or tums and take with addition pain. Call if seeing blood in stool or more pain.   Food Choices for Gastroesophageal Reflux Disease, Adult When you have gastroesophageal reflux disease (GERD), the foods you eat and your eating habits are very important. Choosing the right foods can help ease your discomfort. Think about working with a nutrition specialist (dietitian) to help you make good choices. What are tips for following this plan?  Meals  Choose healthy foods that are low in fat, such as fruits, vegetables, whole grains, low-fat dairy products, and lean meat, fish, and poultry.  Eat small meals often instead of 3 large meals a day. Eat your meals slowly, and in a place where you are relaxed. Avoid bending over or lying down until 2-3 hours after eating.  Avoid eating meals 2-3 hours before bed.  Avoid drinking a lot of liquid with meals.  Cook foods using methods other than frying. Bake, grill, or broil food instead.  Avoid or limit: ? Chocolate. ? Peppermint or spearmint. ? Alcohol. ? Pepper. ? Black and decaffeinated coffee. ? Black and decaffeinated tea. ? Bubbly (carbonated) soft drinks. ? Caffeinated energy drinks and soft drinks.  Limit high-fat foods such as: ? Fatty meat or fried foods. ? Whole milk, cream, butter, or ice cream. ? Nuts and nut butters. ? Pastries, donuts, and sweets made with butter or shortening.  Avoid foods that cause symptoms. These foods may be different for everyone. Common foods that cause symptoms include: ? Tomatoes. ? Oranges, lemons, and limes. ? Peppers. ? Spicy food. ? Onions and garlic. ? Vinegar. Lifestyle  Maintain a healthy weight. Ask your doctor what weight is healthy for you. If you need to lose weight, work with your doctor to do so safely.  Exercise for at least 30 minutes for 5 or more days each week, or as told by your doctor.  Wear loose-fitting clothes.  Do not smoke. If you need  help quitting, ask your doctor.  Sleep with the head of your bed higher than your feet. Use a wedge under the mattress or blocks under the bed frame to raise the head of the bed. Summary  When you have gastroesophageal reflux disease (GERD), food and lifestyle choices are very important in easing your symptoms.  Eat small meals often instead of 3 large meals a day. Eat your meals slowly, and in a place where you are relaxed.  Limit high-fat foods such as fatty meat or fried foods.  Avoid bending over or lying down until 2-3 hours after eating.  Avoid peppermint and spearmint, caffeine, alcohol, and chocolate. This information is not intended to replace advice given to you by your health care provider. Make sure you discuss any questions you have with your health care provider. Document Revised: 05/31/2018 Document Reviewed: 03/15/2016 Elsevier Patient Education  2020 [employer].

## 2019-11-20 NOTE — Progress Notes (Signed)
Patient ID: Ryan Gibson, male    DOB: Jun 22, 1975, 44 y.o.   MRN: [id number]   Chief Complaint  Patient presents with   Abdominal Pain   Subjective:    HPI  CC-epigastric pain. Started 2 weeks ago. Went to urgent care on 9/24. Pain when taking a deep breath. Taking motrin 800mg  but stopped because it was not helping. Per notes, pt stating they didn't give any medications for treatment of epigastric pain.  Per note from urgent care, that he was there "to get a work note to see pcp to be out of work for referral to GI."  Mild discomfort and worsening about 5 days ago. Can't sleep hard to take dep breath and pain 7/10. Bending over or walking up step feeling nausea.  No imagine in past.  No GI doctor. No heart burn or sour taste.  Not on meds for this. 800mg  motrin was taking a lot around 2,000-3,000mg  some days.  Urgent care-  Didn't do anything and just told to get referred to GI. Out to work till tomorrow to see pcp.  No pain with eating. Lost some appetite. But ate lunch today, Burger.   Having pain in room and on palpation. Not wanting to go to ER and declining xray of abdomen. Pt feeling he needs to lean back or lay at 45 deg angle when sleeping due to pain.   Medical History Ryan Gibson has a past medical history of Asthma and Foot pain, right.   Outpatient Encounter Medications as of 11/20/2019  Medication Sig   colchicine 0.6 MG tablet Take 1 tablet (0.6 mg total) by mouth 2 (two) times daily.   colchicine 0.6 MG tablet Take 1 tablet (0.6 mg total) by mouth 2 (two) times daily.   diclofenac (VOLTAREN) 75 MG EC tablet TAKE 1 TABLET(75 MG) BY MOUTH TWICE DAILY   omeprazole (PRILOSEC) 40 MG capsule Take 1 capsule (40 mg total) by mouth daily.   testosterone cypionate (DEPOTESTOSTERONE CYPIONATE) 200 MG/ML injection INJECT 1 ML WEEKLY   [DISCONTINUED] allopurinol (ZYLOPRIM) 300 MG tablet Take 1 tablet (300 mg total) by mouth daily. (Patient not taking: Reported on  06/24/2019)   [DISCONTINUED] buPROPion (WELLBUTRIN SR) 150 MG 12 hr tablet Take one qam for 3 days, then one bid (Patient not taking: Reported on 12/24/2018)   [DISCONTINUED] cephALEXin (KEFLEX) 500 MG capsule Take 1 capsule (500 mg total) by mouth 3 (three) times daily. (Patient not taking: Reported on 06/24/2019)   [DISCONTINUED] doxycycline (VIBRAMYCIN) 100 MG capsule Take 1 capsule (100 mg total) by mouth 2 (two) times daily. (Patient not taking: Reported on 06/24/2019)   [DISCONTINUED] HYDROcodone-acetaminophen (NORCO/VICODIN) 5-325 MG tablet Take on tablet every 6 hours as needed for pain (Patient not taking: Reported on 12/24/2018)   [DISCONTINUED] predniSONE (DELTASONE) 20 MG tablet Take 3 qd for 3 days, then 2 qd for 3 days, then one qd for 3 days (Patient not taking: Reported on 06/24/2019)   [DISCONTINUED] predniSONE (DELTASONE) 20 MG tablet Take 3 qd for 3 days, then 2 qd for 3 days, then 1 qd for 3 days   No facility-administered encounter medications on file as of 11/20/2019.     Review of Systems  Constitutional: Negative for chills and fever.  HENT: Negative for congestion, rhinorrhea and sore throat.   Respiratory: Negative for cough, shortness of breath and wheezing.   Cardiovascular: Negative for chest pain and leg swelling.  Gastrointestinal: Positive for abdominal pain. Negative for blood in stool, constipation, diarrhea, nausea  and vomiting.  Genitourinary: Negative for dysuria and frequency.  Skin: Negative for rash.  Neurological: Negative for dizziness, weakness and headaches.     Vitals BP 112/78    Pulse (!) 107    Temp 98.5 F (36.9 C)    Ht 6\' 2"  (1.88 m)    Wt 264 lb (119.7 kg)    SpO2 99%    BMI 33.90 kg/m   Objective:   Physical Exam Vitals and nursing note reviewed.  Constitutional:      General: He is in acute distress (due to pain).     Appearance: Normal appearance. He is well-developed. He is not ill-appearing.  HENT:     Head: Normocephalic.      Nose: Nose normal. No congestion.  Eyes:     Conjunctiva/sclera: Conjunctivae normal.  Cardiovascular:     Rate and Rhythm: Regular rhythm. Tachycardia present.     Pulses: Normal pulses.     Heart sounds: Normal heart sounds. No murmur heard.   Pulmonary:     Effort: Pulmonary effort is normal.     Breath sounds: Normal breath sounds. No wheezing, rhonchi or rales.  Abdominal:     General: Abdomen is flat. Bowel sounds are normal. There is no distension.     Palpations: Abdomen is rigid. There is no mass.     Tenderness: There is generalized abdominal tenderness. There is guarding. There is no rebound. Negative signs include Murphy's sign and McBurney's sign.     Hernia: No hernia is present.  Musculoskeletal:        General: Normal range of motion.     Right lower leg: No edema.     Left lower leg: No edema.  Skin:    General: Skin is warm and dry.     Findings: No rash.  Neurological:     General: No focal deficit present.     Mental Status: He is alert and oriented to person, place, and time.     Cranial Nerves: No cranial nerve deficit.  Psychiatric:        Mood and Affect: Mood normal.        Behavior: Behavior normal.      Assessment and Plan   1. Epigastric pain - CBC - CMP14+EGFR - Lipid panel - omeprazole (PRILOSEC) 40 MG capsule; Take 1 capsule (40 mg total) by mouth daily.  Dispense: 30 capsule; Refill: 1 - Ambulatory referral to Gastroenterology - Lipase    Epigastric pain and possible ulcer vs. Hiatal hernia, vs. Gastritis. Pt in mild pain on exam. Offered abd xray, pt declining at this time. Advising to Stop nsaids, caffeine, alcohol intake, and spicy/greasy foods. Was taking diclofenac. Advising to stop taking this.  Was taking it for gout. Last 6 days not drinking alchol. Alcohol intake- 12 pk or more per week, normally. Caffeine- 1 per day.  Reviewed things to avoid for acid reflux and possible ulcer.   Labs ordered today.  Pt in agreement.   Gave referral to GI.  F/u in 4 wks or prn.

## 2019-11-20 NOTE — Telephone Encounter (Signed)
Pt needs detailed letter for work that he is unable to lift over 5 pounds, no bending, no climbing, no crawling. The letter that was provided was only listed as light duty.  Pt call back [phone]

## 2019-11-21 ENCOUNTER — Encounter (INDEPENDENT_AMBULATORY_CARE_PROVIDER_SITE_OTHER): Payer: Self-pay | Admitting: Gastroenterology

## 2019-11-21 LAB — LIPID PANEL
Chol/HDL Ratio: 5.6 ratio — ABNORMAL HIGH (ref 0.0–5.0)
Cholesterol, Total: 184 mg/dL (ref 100–199)
HDL: 33 mg/dL — ABNORMAL LOW (ref >39)
LDL Chol Calc (NIH): 110 mg/dL — ABNORMAL HIGH (ref 0–99)
Triglycerides: 234 mg/dL — ABNORMAL HIGH (ref 0–149)
VLDL Cholesterol Cal: 41 mg/dL — ABNORMAL HIGH (ref 5–40)

## 2019-11-21 LAB — CMP14+EGFR
ALT: 81 IU/L — ABNORMAL HIGH (ref 0–44)
AST: 46 IU/L — ABNORMAL HIGH (ref 0–40)
Albumin/Globulin Ratio: 2 (ref 1.2–2.2)
Albumin: 4.7 g/dL (ref 4.0–5.0)
Alkaline Phosphatase: 56 IU/L (ref 44–121)
BUN/Creatinine Ratio: 15 (ref 9–20)
BUN: 15 mg/dL (ref 6–24)
Bilirubin Total: 0.9 mg/dL (ref 0.0–1.2)
CO2: 25 mmol/L (ref 20–29)
Calcium: 9.2 mg/dL (ref 8.7–10.2)
Chloride: 102 mmol/L (ref 96–106)
Creatinine, Ser: 1.03 mg/dL (ref 0.76–1.27)
GFR calc Af Amer: 102 mL/min/{1.73_m2} (ref >59)
GFR calc non Af Amer: 89 mL/min/{1.73_m2} (ref >59)
Globulin, Total: 2.4 g/dL (ref 1.5–4.5)
Glucose: 115 mg/dL — ABNORMAL HIGH (ref 65–99)
Potassium: 4.8 mmol/L (ref 3.5–5.2)
Sodium: 139 mmol/L (ref 134–144)
Total Protein: 7.1 g/dL (ref 6.0–8.5)

## 2019-11-21 LAB — CBC
Hematocrit: 45.2 % (ref 37.5–51.0)
Hemoglobin: 15.9 g/dL (ref 13.0–17.7)
MCH: 33.4 pg — ABNORMAL HIGH (ref 26.6–33.0)
MCHC: 35.2 g/dL (ref 31.5–35.7)
MCV: 95 fL (ref 79–97)
Platelets: 243 10*3/uL (ref 150–450)
RBC: 4.76 x10E6/uL (ref 4.14–5.80)
RDW: 12 % (ref 11.6–15.4)
WBC: 6.6 10*3/uL (ref 3.4–10.8)

## 2019-11-21 LAB — LIPASE: Lipase: 49 U/L (ref 13–78)

## 2019-11-21 NOTE — Telephone Encounter (Signed)
Pt wants letter sent to his Mychart

## 2019-11-22 ENCOUNTER — Encounter: Payer: Self-pay | Admitting: Family Medicine

## 2019-11-22 ENCOUNTER — Telehealth: Payer: Self-pay

## 2019-11-22 NOTE — Telephone Encounter (Signed)
Pls give note for pt to have no lifting over 15 lbs and no bending over or crawling for 1 wk, pt needs to be seen again after 1 wk of medications to re-assess.   Thx.   Dr. Ladona Ridgel

## 2019-11-22 NOTE — Telephone Encounter (Signed)
Patient states he was given a work note at his appointment on 9/29 for "light duty" and his work needs it specified as to what he can do and not do, please advise.

## 2019-11-22 NOTE — Telephone Encounter (Signed)
Schedule a gallbladder scan first is message Kerri wanted me to send back to [facility]

## 2019-11-22 NOTE — Telephone Encounter (Signed)
Left message to return call 

## 2019-11-22 NOTE — Telephone Encounter (Signed)
Unable to write that note.  Pt needs to try medications and return to office for evaluation in 2-3 wks.   Dr. Ladona Ridgel

## 2019-11-25 NOTE — Telephone Encounter (Signed)
Patient notified - can we please send a note with Dr. Lubertha Basque recommendation to pts mychart.

## 2019-11-25 NOTE — Telephone Encounter (Signed)
Its already been done he setup mychart before he left office that day.

## 2019-11-27 ENCOUNTER — Ambulatory Visit: Payer: BC Managed Care – PPO | Admitting: Family Medicine

## 2019-11-27 DIAGNOSIS — M238X1 Other internal derangements of right knee: Secondary | ICD-10-CM | POA: Diagnosis not present

## 2019-12-03 ENCOUNTER — Ambulatory Visit (HOSPITAL_COMMUNITY): Payer: BC Managed Care – PPO

## 2019-12-04 ENCOUNTER — Ambulatory Visit: Payer: BC Managed Care – PPO | Admitting: Family Medicine

## 2019-12-09 DIAGNOSIS — R7989 Other specified abnormal findings of blood chemistry: Secondary | ICD-10-CM | POA: Diagnosis not present

## 2019-12-09 DIAGNOSIS — M1A09X0 Idiopathic chronic gout, multiple sites, without tophus (tophi): Secondary | ICD-10-CM | POA: Diagnosis not present

## 2019-12-09 DIAGNOSIS — M2550 Pain in unspecified joint: Secondary | ICD-10-CM | POA: Diagnosis not present

## 2019-12-14 DIAGNOSIS — M25561 Pain in right knee: Secondary | ICD-10-CM | POA: Diagnosis not present

## 2019-12-18 ENCOUNTER — Ambulatory Visit: Payer: BC Managed Care – PPO | Admitting: Family Medicine

## 2019-12-20 DIAGNOSIS — M7651 Patellar tendinitis, right knee: Secondary | ICD-10-CM | POA: Diagnosis not present

## 2019-12-20 DIAGNOSIS — M25561 Pain in right knee: Secondary | ICD-10-CM | POA: Diagnosis not present

## 2019-12-23 ENCOUNTER — Ambulatory Visit (INDEPENDENT_AMBULATORY_CARE_PROVIDER_SITE_OTHER): Payer: BC Managed Care – PPO | Admitting: Gastroenterology

## 2019-12-25 ENCOUNTER — Ambulatory Visit: Payer: BC Managed Care – PPO | Admitting: Family Medicine

## 2019-12-30 DIAGNOSIS — R7989 Other specified abnormal findings of blood chemistry: Secondary | ICD-10-CM | POA: Diagnosis not present

## 2019-12-30 DIAGNOSIS — M2550 Pain in unspecified joint: Secondary | ICD-10-CM | POA: Diagnosis not present

## 2019-12-30 DIAGNOSIS — M1A09X0 Idiopathic chronic gout, multiple sites, without tophus (tophi): Secondary | ICD-10-CM | POA: Diagnosis not present

## 2020-01-08 ENCOUNTER — Other Ambulatory Visit: Payer: Self-pay | Admitting: Family Medicine

## 2020-01-08 ENCOUNTER — Encounter: Payer: Self-pay | Admitting: *Deleted

## 2020-01-08 NOTE — Telephone Encounter (Signed)
Call pt, how often is he taking the diclofenac?  Don't want to take this daily for months.   If it's just for gout flare then we can give him a few refills.   Thx,   Dr. Ladona Ridgel

## 2020-01-09 NOTE — Telephone Encounter (Signed)
Did you speak with this pt yet?  Thx. Dr. Karie Schwalbe

## 2020-01-14 NOTE — Telephone Encounter (Signed)
Sent mychart message and lmtc.

## 2020-01-17 ENCOUNTER — Other Ambulatory Visit: Payer: Self-pay

## 2020-01-17 ENCOUNTER — Ambulatory Visit
Admission: EM | Admit: 2020-01-17 | Discharge: 2020-01-17 | Disposition: A | Discharge status: Home / Self Care | Payer: BC Managed Care – PPO

## 2020-01-17 DIAGNOSIS — J209 Acute bronchitis, unspecified: Secondary | ICD-10-CM | POA: Diagnosis not present

## 2020-01-17 DIAGNOSIS — R059 Cough, unspecified: Secondary | ICD-10-CM

## 2020-01-17 MED ORDER — CETIRIZINE-PSEUDOEPHEDRINE ER 5-120 MG PO TB12: 1.0000 | ORAL_TABLET | Freq: Every day | ORAL | 0 refills | Status: DC

## 2020-01-17 MED ORDER — PREDNISONE 10 MG (21) PO TBPK: ORAL_TABLET | Freq: Every day | ORAL | 0 refills | Status: AC

## 2020-01-17 MED ORDER — HYDROCOD POLST-CPM POLST ER 10-8 MG/5ML PO SUER: 5.0000 mL | Freq: Two times a day (BID) | ORAL | 0 refills | Status: DC | PRN

## 2020-01-17 NOTE — ED Triage Notes (Signed)
Pt is here with a cough that started 5 days ago, pt has taken OTC meds to relieve discomfort.

## 2020-01-17 NOTE — Discharge Instructions (Addendum)
I have sent in a prednisone taper for you to take for 6 days. 6 tablets on day one, 5 tablets on day two, 4 tablets on day three, 3 tablets on day four, 2 tablets on day five, and 1 tablet on day six.  I have sent in Zyrtec-D for you to take daily as well  I have sent in Tussionex for you to take 5 mL twice a day as needed for cough  Follow up with this office or with primary care if symptoms are persisting.  Follow up in the ER for high fever, trouble swallowing, trouble breathing, other concerning symptoms.

## 2020-01-17 NOTE — ED Provider Notes (Signed)
[place]   [id number] 01/17/20 Arrival Time: 48   SUBJECTIVE:  Ryan Gibson is a 44 y.o. male who presents with complaint of nasal congestion, post-nasal drainage, and a persistent cough. Onset abrupt approximately 5 days ago. Overall fatigued. SOB: mild. Wheezing: mild. Has negative history of Covid. Has not completed Covid vaccines. OTC treatment: has used OTC cough and cold with no releif. Has used daughter's nebulizer with some relief. Social History   Tobacco Use  Smoking Status Never Smoker  Smokeless Tobacco Current User  . Types: Chew    ROS: As per HPI.   OBJECTIVE:  Vitals:   01/17/20 1442  BP: 123/87  Pulse: (!) 116  Resp: 18  Temp: 97.8 F (36.6 C)  TempSrc: Oral  SpO2: 97%     General appearance: alert; no distress HEENT: nasal congestion; clear runny nose; throat irritation secondary to post-nasal drainage Neck: supple with LAD Lungs: mild wheezing noted to bilateral lower lobes Skin: warm and dry Psychological: alert and cooperative; normal mood and affect  Results for orders placed or performed in visit on 11/20/19  CBC  Result Value Ref Range   WBC 6.6 3.4 - 10.8 x10E3/uL   RBC 4.76 4.14 - 5.80 x10E6/uL   Hemoglobin 15.9 13.0 - 17.7 g/dL   Hematocrit 45.2 37.5 - 51.0 %   MCV 95 79 - 97 fL   MCH 33.4 (H) 26.6 - 33.0 pg   MCHC 35.2 31 - 35 g/dL   RDW 12.0 11.6 - 15.4 %   Platelets 243 150 - 450 x10E3/uL  CMP14+EGFR  Result Value Ref Range   Glucose 115 (H) 65 - 99 mg/dL   BUN 15 6 - 24 mg/dL   Creatinine, Ser 1.03 0.76 - 1.27 mg/dL   GFR calc non Af Amer 89 >59 mL/min/1.73   GFR calc Af Amer 102 >59 mL/min/1.73   BUN/Creatinine Ratio 15 9 - 20   Sodium 139 134 - 144 mmol/L   Potassium 4.8 3.5 - 5.2 mmol/L   Chloride 102 96 - 106 mmol/L   CO2 25 20 - 29 mmol/L   Calcium 9.2 8.7 - 10.2 mg/dL   Total Protein 7.1 6.0 - 8.5 g/dL   Albumin 4.7 4.0 - 5.0 g/dL   Globulin, Total 2.4 1.5 - 4.5 g/dL   Albumin/Globulin  Ratio 2.0 1.2 - 2.2   Bilirubin Total 0.9 0.0 - 1.2 mg/dL   Alkaline Phosphatase 56 44 - 121 IU/L   AST 46 (H) 0 - 40 IU/L   ALT 81 (H) 0 - 44 IU/L  Lipid panel  Result Value Ref Range   Cholesterol, Total 184 100 - 199 mg/dL   Triglycerides 234 (H) 0 - 149 mg/dL   HDL 33 (L) >39 mg/dL   VLDL Cholesterol Cal 41 (H) 5 - 40 mg/dL   LDL Chol Calc (NIH) 110 (H) 0 - 99 mg/dL   Chol/HDL Ratio 5.6 (H) 0.0 - 5.0 ratio  Lipase  Result Value Ref Range   Lipase 49 13 - 78 U/L    Labs Reviewed - No data to display  No results found.  Allergies  Allergen Reactions  . No Known Allergies   . Vancomycin Hives    hi8vess post op on R arm -site of infusion, and whole body itching    Past Medical History:  Diagnosis Date  . Asthma    AS A CHILD ONLY  . Foot pain, right    painful x 1 month and swelling  Social History   Socioeconomic History  . Marital status: Married    Spouse name: Not on file  . Number of children: Not on file  . Years of education: Not on file  . Highest education level: Not on file  Occupational History  . Not on file  Tobacco Use  . Smoking status: Never Smoker  . Smokeless tobacco: Current User    Types: Chew  Substance and Sexual Activity  . Alcohol use: Yes    Comment: Social  . Drug use: No  . Sexual activity: Yes    Birth control/protection: None    Comment: Married  Other Topics Concern  . Not on file  Social History Narrative  . Not on file   Social Determinants of Health   Financial Resource Strain:   . Difficulty of Paying Living Expenses: Not on file  Food Insecurity:   . Worried About Charity fundraiser in the Last Year: Not on file  . Ran Out of Food in the Last Year: Not on file  Transportation Needs:   . Lack of Transportation (Medical): Not on file  . Lack of Transportation (Non-Medical): Not on file  Physical Activity:   . Days of Exercise per Week: Not on file  . Minutes of Exercise per Session: Not on file  Stress:     . Feeling of Stress : Not on file  Social Connections:   . Frequency of Communication with Friends and Family: Not on file  . Frequency of Social Gatherings with Friends and Family: Not on file  . Attends Religious Services: Not on file  . Active Member of Clubs or Organizations: Not on file  . Attends Archivist Meetings: Not on file  . Marital Status: Not on file  Intimate Partner Violence:   . Fear of Current or Ex-Partner: Not on file  . Emotionally Abused: Not on file  . Physically Abused: Not on file  . Sexually Abused: Not on file   Family History  Problem Relation Age of Onset  . Healthy Mother   . Heart disease Father      ASSESSMENT & PLAN:  1. Acute bronchitis, unspecified organism   2. Cough     Meds ordered this encounter  Medications  . chlorpheniramine-HYDROcodone (TUSSIONEX PENNKINETIC ER) 10-8 MG/5ML SUER    Sig: Take 5 mLs by mouth every 12 (twelve) hours as needed for cough.    Dispense:  140 mL    Refill:  0    Order Specific Question:   Supervising Provider    Answer:   Chase Picket [id number]  . predniSONE (STERAPRED UNI-PAK 21 TAB) 10 MG (21) TBPK tablet    Sig: Take by mouth daily for 6 days. Take 6 tablets on day 1, 5 tablets on day 2, 4 tablets on day 3, 3 tablets on day 4, 2 tablets on day 5, 1 tablet on day 6    Dispense:  21 tablet    Refill:  0    Order Specific Question:   Supervising Provider    Answer:   Chase Picket [id number]  . cetirizine-pseudoephedrine (ZYRTEC-D) 5-120 MG tablet    Sig: Take 1 tablet by mouth daily.    Dispense:  30 tablet    Refill:  0    Order Specific Question:   Supervising Provider    Answer:   Chase Picket [id number]   Prescribed steroid taper Prescribed zyrtec D Prescribed tussionex for cough  Cough medication sedation  precautions.  OTC symptom care as needed. Will plan f/u with PCP or here as needed.  Reviewed expectations re: course of current medical issues. Questions  answered. Outlined signs and symptoms indicating need for Gibson acute intervention. Patient verbalized understanding. After Visit Summary given.           Faustino Congress, NP 01/20/20 1333

## 2020-01-21 DIAGNOSIS — M25461 Effusion, right knee: Secondary | ICD-10-CM | POA: Diagnosis not present

## 2020-01-21 DIAGNOSIS — M7631 Iliotibial band syndrome, right leg: Secondary | ICD-10-CM | POA: Diagnosis not present

## 2020-01-21 DIAGNOSIS — M7651 Patellar tendinitis, right knee: Secondary | ICD-10-CM | POA: Diagnosis not present

## 2020-01-23 ENCOUNTER — Other Ambulatory Visit: Payer: Self-pay | Admitting: Family Medicine

## 2020-01-23 DIAGNOSIS — R1013 Epigastric pain: Secondary | ICD-10-CM

## 2020-02-07 ENCOUNTER — Other Ambulatory Visit: Payer: Self-pay

## 2020-02-07 ENCOUNTER — Ambulatory Visit
Admission: EM | Admit: 2020-02-07 | Discharge: 2020-02-07 | Disposition: A | Discharge status: Home / Self Care | Payer: BC Managed Care – PPO | Attending: Emergency Medicine | Admitting: Emergency Medicine

## 2020-02-07 ENCOUNTER — Ambulatory Visit (INDEPENDENT_AMBULATORY_CARE_PROVIDER_SITE_OTHER): Payer: BC Managed Care – PPO

## 2020-02-07 DIAGNOSIS — M25472 Effusion, left ankle: Secondary | ICD-10-CM

## 2020-02-07 DIAGNOSIS — R059 Cough, unspecified: Secondary | ICD-10-CM

## 2020-02-07 DIAGNOSIS — R0602 Shortness of breath: Secondary | ICD-10-CM

## 2020-02-07 MED ORDER — ALBUTEROL SULFATE HFA 108 (90 BASE) MCG/ACT IN AERS
1.0000 | INHALATION_SPRAY | Freq: Four times a day (QID) | RESPIRATORY_TRACT | 1 refills | Status: DC | PRN
Start: 2020-02-07 — End: 2020-07-16

## 2020-02-07 MED ORDER — PREDNISONE 10 MG (21) PO TBPK: ORAL_TABLET | ORAL | 0 refills | Status: DC

## 2020-02-07 NOTE — Discharge Instructions (Addendum)
Continue tussinex as prescribed for cough  Prednisone was prescribed Albuterol was prescribed for shortness of breath with 1 refill use medications daily for symptom relief Use OTC medications like ibuprofen or tylenol as needed fever or pain Elevate your legs up to help decreased fluid by gravity Call or go to the ED if you have any new or worsening symptoms such as fever, worsening cough, shortness of breath, chest tightness, chest pain, turning blue, changes in mental status, etc..Marland Kitchen

## 2020-02-07 NOTE — ED Provider Notes (Addendum)
[facility]   [id number] 02/07/20 Arrival Time: 1042   Chief Complaint  Patient presents with  . Cough     SUBJECTIVE: History from: patient and family.  Ryan Gibson is a 44 y.o. male who presented to the urgent care with a complaint of left lower leg edema, persistent cough and shortness of breath for the past month.  Denies denies any precipitating event, sick exposure to COVID, flu or strep.  Denies recent travel.  Has tried OTC medication with mild relief.  Symptoms are made worse with activity.  Denies previous symptoms in the past.   Denies fever, chills, fatigue, sinus pain, rhinorrhea, sore throat, wheezing, chest pain, nausea, changes in bowel or bladder habits.    ROS: As per HPI.  All other pertinent ROS negative.     Past Medical History:  Diagnosis Date  . Asthma    AS A CHILD ONLY  . Foot pain, right    painful x 1 month and swelling   Past Surgical History:  Procedure Laterality Date  . HERNIA REPAIR  07/13/2011   RIH  . INGUINAL HERNIA REPAIR  07/13/2011   Procedure: HERNIA REPAIR INGUINAL ADULT;  Surgeon: Wilmon Arms. Corliss Skains, MD;  Location: [facility];  Service: General;  Laterality: Right;  . NO PREVIOUS SURGERY     Allergies  Allergen Reactions  . No Known Allergies   . Vancomycin Hives    hi8vess post op on R arm -site of infusion, and whole body itching   No current facility-administered medications on file prior to encounter.   Current Outpatient Medications on File Prior to Encounter  Medication Sig Dispense Refill  . cetirizine-pseudoephedrine (ZYRTEC-D) 5-120 MG tablet Take 1 tablet by mouth daily. 30 tablet 0  . chlorpheniramine-HYDROcodone (TUSSIONEX PENNKINETIC ER) 10-8 MG/5ML SUER Take 5 mLs by mouth every 12 (twelve) hours as needed for cough. 140 mL 0  . colchicine 0.6 MG tablet Take 1 tablet (0.6 mg total) by mouth 2 (two) times daily. 20 tablet 0  . colchicine 0.6 MG tablet Take 1 tablet (0.6 mg total) by mouth 2 (two) times  daily. 60 tablet 4  . diclofenac (VOLTAREN) 75 MG EC tablet TAKE 1 TABLET(75 MG) BY MOUTH TWICE DAILY 30 tablet 1  . febuxostat (ULORIC) 40 MG tablet Take 40 mg by mouth daily.    Marland Kitchen HYDROcodone-acetaminophen (NORCO) 7.5-325 MG tablet hydrocodone 7.5 mg-acetaminophen 325 mg tablet  TAKE 1 TABLET BY MOUTH EVERY 6 HOURS AS NEEDED FOR PAIN    . HYDROcodone-acetaminophen (NORCO) 7.5-325 MG tablet Take 1 tablet by mouth every 6 (six) hours as needed.    Marland Kitchen omeprazole (PRILOSEC) 40 MG capsule TAKE 1 CAPSULE(40 MG) BY MOUTH DAILY 30 capsule 1  . testosterone cypionate (DEPOTESTOSTERONE CYPIONATE) 200 MG/ML injection INJECT 1 ML WEEKLY 4 mL 3   Social History   Socioeconomic History  . Marital status: Married    Spouse name: Not on file  . Number of children: Not on file  . Years of education: Not on file  . Highest education level: Not on file  Occupational History  . Not on file  Tobacco Use  . Smoking status: Never Smoker  . Smokeless tobacco: Current User    Types: Chew  Substance and Sexual Activity  . Alcohol use: Yes    Comment: Social  . Drug use: No  . Sexual activity: Yes    Birth control/protection: None    Comment: Married  Other Topics Concern  . Not on  file  Social History Narrative  . Not on file   Social Determinants of Health   Financial Resource Strain: Not on file  Food Insecurity: Not on file  Transportation Needs: Not on file  Physical Activity: Not on file  Stress: Not on file  Social Connections: Not on file  Intimate Partner Violence: Not on file   Family History  Problem Relation Age of Onset  . Healthy Mother   . Heart disease Father     OBJECTIVE:  Vitals:   02/07/20 1116  BP: 107/82  Pulse: (!) 114  Resp: 20  Temp: 98 F (36.7 C)  SpO2: 94%     Physical Exam Vitals and nursing note reviewed.  Constitutional:      General: He is not in acute distress.    Appearance: Normal appearance. He is normal weight. He is not ill-appearing,  toxic-appearing or diaphoretic.  HENT:     Head: Normocephalic.     Right Ear: Tympanic membrane, ear canal and external ear normal. There is no impacted cerumen.     Left Ear: Tympanic membrane, ear canal and external ear normal. There is no impacted cerumen.     Nose: No congestion.  Cardiovascular:     Rate and Rhythm: Normal rate and regular rhythm.     Pulses: Normal pulses.     Heart sounds: Normal heart sounds. No murmur heard. No friction rub. No gallop.   Pulmonary:     Effort: Pulmonary effort is normal. No respiratory distress.     Breath sounds: Normal breath sounds. No stridor. No wheezing, rhonchi or rales.  Chest:     Chest wall: No tenderness.  Musculoskeletal:     Comments: Left ankle edema present; no tenderness and nonpitting  Neurological:     Mental Status: He is alert and oriented to person, place, and time.     LABS:  No results found for this or any previous visit (from the past 24 hour(s)).   RADIOLOGY:  DG Chest 2 View  Result Date: 02/07/2020 CLINICAL DATA:  Cough and short of breath 1 month EXAM: CHEST - 2 VIEW COMPARISON:  07/05/2011 FINDINGS: The heart size and mediastinal contours are within normal limits. Both lungs are clear. The visualized skeletal structures are unremarkable. IMPRESSION: No active cardiopulmonary disease. Electronically Signed   By: Marlan Palau M.D.   On: 02/07/2020 11:46    Chest x-ray is negative acute cardiopulmonary disease.  I have reviewed the x-ray myself and the radiologist interpretation.  I am in agreement with the radiologist interpretation.   ASSESSMENT & PLAN:  1. Cough   2. SOB (shortness of breath)   3. Left ankle swelling     Meds ordered this encounter  Medications  . predniSONE (STERAPRED UNI-PAK 21 TAB) 10 MG (21) TBPK tablet    Sig: Take 6 tabs by mouth daily  for 1 days, then 5 tabs for 1 days, then 4 tabs for 1 days, then 3 tabs for 1 days, 2 tabs for 1 days, then 1 tab by mouth daily for 1  days    Dispense:  21 tablet    Refill:  0  . albuterol (VENTOLIN HFA) 108 (90 Base) MCG/ACT inhaler    Sig: Inhale 1-2 puffs into the lungs every 6 (six) hours as needed for wheezing or shortness of breath.    Dispense:  18 g    Refill:  1    Discharge instructions  Continue tussinex as prescribed for cough  Prednisone  was prescribed Albuterol was prescribed for shortness of breath with 1 refill use medications daily for symptom relief Use OTC medications like ibuprofen or tylenol as needed fever or pain Elevate your legs up to help decreased fluid by gravity Call or go to the ED if you have any new or worsening symptoms such as fever, worsening cough, shortness of breath, chest tightness, chest pain, turning blue, changes in mental status, etc...   Reviewed expectations re: course of current medical issues. Questions answered. Outlined signs and symptoms indicating need for more acute intervention. Patient verbalized understanding. After Visit Summary given.         Durward Parcel, FNP 02/07/20 1222    Durward Parcel, FNP 02/07/20 1223

## 2020-02-07 NOTE — ED Triage Notes (Signed)
Pt presents with persistent cough  For past month and has worsening SOB

## 2020-02-07 NOTE — ED Triage Notes (Signed)
Pt also has left lower leg edema

## 2020-02-10 ENCOUNTER — Encounter (INDEPENDENT_AMBULATORY_CARE_PROVIDER_SITE_OTHER): Payer: Self-pay | Admitting: Internal Medicine

## 2020-02-10 ENCOUNTER — Other Ambulatory Visit: Payer: Self-pay

## 2020-02-10 ENCOUNTER — Ambulatory Visit (INDEPENDENT_AMBULATORY_CARE_PROVIDER_SITE_OTHER): Payer: BC Managed Care – PPO | Admitting: Internal Medicine

## 2020-02-10 ENCOUNTER — Telehealth (INDEPENDENT_AMBULATORY_CARE_PROVIDER_SITE_OTHER): Payer: Self-pay

## 2020-02-10 VITALS — BP 122/78 | HR 113 | Temp 96.8°F | Ht 73.5 in | Wt 250.2 lb

## 2020-02-10 DIAGNOSIS — R634 Abnormal weight loss: Secondary | ICD-10-CM

## 2020-02-10 DIAGNOSIS — R160 Hepatomegaly, not elsewhere classified: Secondary | ICD-10-CM | POA: Diagnosis not present

## 2020-02-10 DIAGNOSIS — M25561 Pain in right knee: Secondary | ICD-10-CM

## 2020-02-10 DIAGNOSIS — G8929 Other chronic pain: Secondary | ICD-10-CM

## 2020-02-10 DIAGNOSIS — M20001 Unspecified deformity of right finger(s): Secondary | ICD-10-CM | POA: Diagnosis not present

## 2020-02-10 NOTE — Telephone Encounter (Signed)
Called patient and let him know that since his insurance needed a prior authorization on his STAT CT scan that he could 1) go to the ER now and have the scan done OR 2) have someone call back to schedule his CT Scan once the PA is approved. Patient verbalized that he would like to wait to have done tomorrow since he has 4 children and his wife works in [place] and he will have to wait until she gets home so he can have the scan done. I advised for patient to NPO after 10 pm and let him know someone will call him tomorrow to schedule CT scan. Patient verbalized an understanding.

## 2020-02-10 NOTE — Progress Notes (Addendum)
Metrics: Intervention Frequency ACO  Documented Smoking Status Yearly  Screened one or more times in 24 months  Cessation Counseling or  Active cessation medication Past 24 months  Past 24 months   Guideline developer: UpToDate (See UpToDate for funding source) Date Released: 2014       Wellness Office Visit  Subjective:  Patient ID: Ryan Gibson, male    DOB: 29-Mar-1975  Age: 44 y.o. MRN: [id number]  CC: This 44 year old man comes to our practice as a new patient to establish care. HPI  He has been seen Dr. Ladona Ridgel and blood work in September showed elevated liver enzymes.  He was scheduled to have ultrasound done but apparently this was never done. He is now complaining of discomfort/pain in the epigastric area for the last 2 months with a 25 pound unintentional weight loss. He does not have a history of alcohol excess. Past Medical History:  Diagnosis Date  . Asthma    AS A CHILD ONLY  . Foot pain, right    painful x 1 month and swelling   Past Surgical History:  Procedure Laterality Date  . HERNIA REPAIR  07/13/2011   RIH  . INGUINAL HERNIA REPAIR  07/13/2011   Procedure: HERNIA REPAIR INGUINAL ADULT;  Surgeon: Wilmon Arms. Corliss Skains, MD;  Location: [facility];  Service: General;  Laterality: Right;  . NO PREVIOUS SURGERY       Family History  Problem Relation Age of Onset  . Healthy Mother   . Heart disease Father     Social History   Social History Narrative   Married for 13 years.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   Social History   Tobacco Use  . Smoking status: Never Smoker  . Smokeless tobacco: Current User    Types: Chew  Substance Use Topics  . Alcohol use: Yes    Comment: Social    Current Meds  Medication Sig  . albuterol (VENTOLIN HFA) 108 (90 Base) MCG/ACT inhaler Inhale 1-2 puffs into the lungs every 6 (six) hours as needed for wheezing or shortness of breath.  . colchicine 0.6 MG tablet Take 1 tablet (0.6 mg  total) by mouth 2 (two) times daily.  . diclofenac (VOLTAREN) 75 MG EC tablet TAKE 1 TABLET(75 MG) BY MOUTH TWICE DAILY  . febuxostat (ULORIC) 40 MG tablet Take 40 mg by mouth daily.  . predniSONE (STERAPRED UNI-PAK 21 TAB) 10 MG (21) TBPK tablet Take 6 tabs by mouth daily  for 1 days, then 5 tabs for 1 days, then 4 tabs for 1 days, then 3 tabs for 1 days, 2 tabs for 1 days, then 1 tab by mouth daily for 1 days      Depression screen [facility] 2/9 02/10/2020 03/07/2018  Decreased Interest 0 1  Down, Depressed, Hopeless 0 1  PHQ - 2 Score 0 2  Altered sleeping 0 0  Tired, decreased energy 0 1  Change in appetite 0 0  Feeling bad or failure about yourself  0 1  Trouble concentrating 0 0  Moving slowly or fidgety/restless 0 0  Suicidal thoughts 0 0  PHQ-9 Score 0 4  Difficult doing work/chores Not difficult at all Not difficult at all     Objective:   Today's Vitals: BP 122/78   Pulse (!) 113   Temp (!) 96.8 F (36 C) (Temporal)   Ht 6' 1.5" (1.867 m)   Wt 250 lb 3.2 oz (113.5 kg)   SpO2 98%   BMI  32.56 kg/m  Vitals with BMI 02/10/2020 02/07/2020 01/17/2020  Height 6' 1.5" - -  Weight 250 lbs 3 oz - -  BMI 32.56 - -  Systolic 122 107 573  Diastolic 78 82 87  Pulse 113 114 116     Physical Exam  He is obese.  Examination of his abdomen appears to show hepatomegaly, superficially tender.     Assessment   1. Chronic pain of right knee   2. Acquired finger deformity, right   3. Hepatomegaly   4. Weight loss, unintentional       Tests ordered Orders Placed This Encounter  Procedures  . CT Abdomen Pelvis W Contrast  . CBC  . COMPLETE METABOLIC PANEL WITH GFR  . Protime-INR    Right now well Plan: 1. I am deeply concerned about possibility of malignancy with quite significant hepatomegaly.  We will do blood work to look further regarding his liver enzymes and also liver function but I am going to order a stat CT of the abdomen/pelvis with contrast. 2. He is due  to have a right knee surgery in 2 days time and I have told him that I want to get the CT scan of the abdomen done first because if this shows a sinister process, his knee surgery will have to be postponed.  Addendum: CT scan of the abdomen did show thickened gallbladder although there was no hepatomegaly.  Recommendation was by radiology to schedule ultrasound of the upper abdomen and we will arrange this.  I have spoken with his orthopedic surgeon, Dr. Aundria Rud, and alerted him that I would not recommend right knee surgery as planned on 02/12/2020 until further evaluation has been done regarding his gallbladder. No orders of the defined types were placed in this encounter.   Wilson Singer, MD

## 2020-02-11 ENCOUNTER — Other Ambulatory Visit (INDEPENDENT_AMBULATORY_CARE_PROVIDER_SITE_OTHER): Payer: Self-pay | Admitting: Internal Medicine

## 2020-02-11 ENCOUNTER — Telehealth (INDEPENDENT_AMBULATORY_CARE_PROVIDER_SITE_OTHER): Payer: Self-pay

## 2020-02-11 ENCOUNTER — Ambulatory Visit (HOSPITAL_COMMUNITY)
Admission: RE | Admit: 2020-02-11 | Discharge: 2020-02-11 | Disposition: A | Discharge status: Home / Self Care | Payer: BC Managed Care – PPO | Source: Ambulatory Visit | Attending: Internal Medicine | Admitting: Internal Medicine

## 2020-02-11 DIAGNOSIS — R109 Unspecified abdominal pain: Secondary | ICD-10-CM | POA: Diagnosis not present

## 2020-02-11 DIAGNOSIS — R160 Hepatomegaly, not elsewhere classified: Secondary | ICD-10-CM | POA: Diagnosis not present

## 2020-02-11 DIAGNOSIS — K828 Other specified diseases of gallbladder: Secondary | ICD-10-CM

## 2020-02-11 DIAGNOSIS — R634 Abnormal weight loss: Secondary | ICD-10-CM | POA: Diagnosis not present

## 2020-02-11 LAB — CBC
HCT: 45.3 % (ref 38.5–50.0)
Hemoglobin: 15 g/dL (ref 13.2–17.1)
MCH: 30.7 pg (ref 27.0–33.0)
MCHC: 33.1 g/dL (ref 32.0–36.0)
MCV: 92.6 fL (ref 80.0–100.0)
MPV: 10.8 fL (ref 7.5–12.5)
Platelets: 396 10*3/uL (ref 140–400)
RBC: 4.89 10*6/uL (ref 4.20–5.80)
RDW: 14.8 % (ref 11.0–15.0)
WBC: 12.6 10*3/uL — ABNORMAL HIGH (ref 3.8–10.8)

## 2020-02-11 LAB — COMPLETE METABOLIC PANEL WITH GFR
AG Ratio: 1.7 (calc) (ref 1.0–2.5)
ALT: 70 U/L — ABNORMAL HIGH (ref 9–46)
AST: 40 U/L (ref 10–40)
Albumin: 3.9 g/dL (ref 3.6–5.1)
Alkaline phosphatase (APISO): 65 U/L (ref 36–130)
BUN: 20 mg/dL (ref 7–25)
CO2: 25 mmol/L (ref 20–32)
Calcium: 9.1 mg/dL (ref 8.6–10.3)
Chloride: 103 mmol/L (ref 98–110)
Creat: 1.08 mg/dL (ref 0.60–1.35)
GFR, Est African American: 96 mL/min/{1.73_m2} (ref >=60)
GFR, Est Non African American: 83 mL/min/{1.73_m2} (ref >=60)
Globulin: 2.3 g/dL (calc) (ref 1.9–3.7)
Glucose, Bld: 142 mg/dL — ABNORMAL HIGH (ref 65–99)
Potassium: 4.6 mmol/L (ref 3.5–5.3)
Sodium: 139 mmol/L (ref 135–146)
Total Bilirubin: 0.8 mg/dL (ref 0.2–1.2)
Total Protein: 6.2 g/dL (ref 6.1–8.1)

## 2020-02-11 LAB — PROTIME-INR
INR: 1.3 — ABNORMAL HIGH
Prothrombin Time: 13.9 s — ABNORMAL HIGH (ref 9.0–11.5)

## 2020-02-11 MED ORDER — IOHEXOL 300 MG/ML  SOLN
100.0000 mL | Freq: Once | INTRAMUSCULAR | Status: AC | PRN
  Administered 2020-02-11: 100 mL via INTRAVENOUS

## 2020-02-11 NOTE — Progress Notes (Signed)
[place] abdo

## 2020-02-11 NOTE — Telephone Encounter (Signed)
Called patient and advised for him to go to [facility] to pick up prep contrast by 12pm today and let him know that the CT scan is scheduled today at 2:30pm. Patient verbalized an understanding and thanked [place] for all that we are doing!

## 2020-02-12 ENCOUNTER — Other Ambulatory Visit (INDEPENDENT_AMBULATORY_CARE_PROVIDER_SITE_OTHER): Payer: Self-pay | Admitting: Internal Medicine

## 2020-02-12 ENCOUNTER — Ambulatory Visit (HOSPITAL_COMMUNITY)
Admission: RE | Admit: 2020-02-12 | Discharge: 2020-02-12 | Disposition: A | Discharge status: Home / Self Care | Payer: BC Managed Care – PPO | Source: Ambulatory Visit | Attending: Internal Medicine | Admitting: Internal Medicine

## 2020-02-12 ENCOUNTER — Telehealth (INDEPENDENT_AMBULATORY_CARE_PROVIDER_SITE_OTHER): Payer: Self-pay

## 2020-02-12 ENCOUNTER — Other Ambulatory Visit: Payer: Self-pay

## 2020-02-12 DIAGNOSIS — K760 Fatty (change of) liver, not elsewhere classified: Secondary | ICD-10-CM | POA: Diagnosis not present

## 2020-02-12 DIAGNOSIS — K828 Other specified diseases of gallbladder: Secondary | ICD-10-CM

## 2020-02-12 NOTE — Telephone Encounter (Signed)
NO auth required for [place] abd limited RUQ. Ref# [id number].

## 2020-02-12 NOTE — Progress Notes (Signed)
Please call this patient and tell him that I believe he has a diseased gallbladder and needs to see a surgeon as soon as possible.  Please refer him to [facility] surgery ASAP.  I will put the order in.  Thanks.

## 2020-02-12 NOTE — Progress Notes (Signed)
Pt was called & given instructions on the Gallbladder. Pt will be waiting for [facility] to contact to see ASAP. Forward referral over ASAP today this morning.

## 2020-02-13 ENCOUNTER — Other Ambulatory Visit (INDEPENDENT_AMBULATORY_CARE_PROVIDER_SITE_OTHER): Payer: Self-pay | Admitting: Nurse Practitioner

## 2020-02-13 DIAGNOSIS — K828 Other specified diseases of gallbladder: Secondary | ICD-10-CM

## 2020-02-13 MED ORDER — AMOXICILLIN-POT CLAVULANATE 875-125 MG PO TABS: 1.0000 | ORAL_TABLET | Freq: Two times a day (BID) | ORAL | 0 refills | Status: DC

## 2020-02-13 NOTE — Progress Notes (Signed)
Return call to patient about new RX for Augmentin. Pt did get the new alert via mobile phone. Pt will pick this medicine today . Will start and be on look out for the [facility] call to set appointment.Marland Kitchen

## 2020-02-13 NOTE — Progress Notes (Signed)
Per Dr. Karilyn Cota I am ordering a course of augmentin that he can start to take while he is awaiting to his surgeon. Please call him and let him know that he should take 1 tablet of augmentin by mouth twice a day. Dr. Karilyn Cota was also asking if you would look into when his appointment with his surgeon is. Thank you!

## 2020-02-13 NOTE — Progress Notes (Signed)
Routing to Dr. Karilyn Cota as an Lorain Childes

## 2020-02-16 ENCOUNTER — Emergency Department: Payer: BC Managed Care – PPO

## 2020-02-16 ENCOUNTER — Inpatient Hospital Stay
Admit: 2020-02-16 | Discharge: 2020-02-16 | Disposition: A | Discharge status: Home / Self Care | Payer: BC Managed Care – PPO | Attending: Internal Medicine | Admitting: Internal Medicine

## 2020-02-16 ENCOUNTER — Inpatient Hospital Stay: Payer: BC Managed Care – PPO

## 2020-02-16 ENCOUNTER — Other Ambulatory Visit: Payer: Self-pay

## 2020-02-16 ENCOUNTER — Inpatient Hospital Stay
Admission: EM | Admit: 2020-02-16 | Discharge: 2020-02-22 | DRG: 175 | Disposition: A | Discharge status: Home / Self Care | Payer: BC Managed Care – PPO | Attending: Internal Medicine | Admitting: Internal Medicine

## 2020-02-16 DIAGNOSIS — E871 Hypo-osmolality and hyponatremia: Secondary | ICD-10-CM | POA: Diagnosis present

## 2020-02-16 DIAGNOSIS — J9601 Acute respiratory failure with hypoxia: Secondary | ICD-10-CM | POA: Diagnosis not present

## 2020-02-16 DIAGNOSIS — M79604 Pain in right leg: Secondary | ICD-10-CM

## 2020-02-16 DIAGNOSIS — Z881 Allergy status to other antibiotic agents status: Secondary | ICD-10-CM

## 2020-02-16 DIAGNOSIS — Z20822 Contact with and (suspected) exposure to covid-19: Secondary | ICD-10-CM | POA: Diagnosis present

## 2020-02-16 DIAGNOSIS — K810 Acute cholecystitis: Secondary | ICD-10-CM | POA: Diagnosis not present

## 2020-02-16 DIAGNOSIS — I2699 Other pulmonary embolism without acute cor pulmonale: Secondary | ICD-10-CM | POA: Diagnosis not present

## 2020-02-16 DIAGNOSIS — K5730 Diverticulosis of large intestine without perforation or abscess without bleeding: Secondary | ICD-10-CM | POA: Diagnosis not present

## 2020-02-16 DIAGNOSIS — M79605 Pain in left leg: Secondary | ICD-10-CM

## 2020-02-16 DIAGNOSIS — R0602 Shortness of breath: Secondary | ICD-10-CM | POA: Diagnosis not present

## 2020-02-16 DIAGNOSIS — K760 Fatty (change of) liver, not elsewhere classified: Secondary | ICD-10-CM | POA: Diagnosis present

## 2020-02-16 DIAGNOSIS — Z8249 Family history of ischemic heart disease and other diseases of the circulatory system: Secondary | ICD-10-CM

## 2020-02-16 DIAGNOSIS — Z79899 Other long term (current) drug therapy: Secondary | ICD-10-CM | POA: Diagnosis not present

## 2020-02-16 DIAGNOSIS — A419 Sepsis, unspecified organism: Secondary | ICD-10-CM

## 2020-02-16 DIAGNOSIS — K812 Acute cholecystitis with chronic cholecystitis: Secondary | ICD-10-CM | POA: Diagnosis not present

## 2020-02-16 DIAGNOSIS — J90 Pleural effusion, not elsewhere classified: Secondary | ICD-10-CM | POA: Diagnosis not present

## 2020-02-16 DIAGNOSIS — K819 Cholecystitis, unspecified: Secondary | ICD-10-CM | POA: Diagnosis not present

## 2020-02-16 DIAGNOSIS — K811 Chronic cholecystitis: Secondary | ICD-10-CM | POA: Diagnosis not present

## 2020-02-16 DIAGNOSIS — K7200 Acute and subacute hepatic failure without coma: Secondary | ICD-10-CM | POA: Diagnosis present

## 2020-02-16 DIAGNOSIS — I5021 Acute systolic (congestive) heart failure: Secondary | ICD-10-CM | POA: Diagnosis not present

## 2020-02-16 DIAGNOSIS — E876 Hypokalemia: Secondary | ICD-10-CM | POA: Diagnosis not present

## 2020-02-16 DIAGNOSIS — M109 Gout, unspecified: Secondary | ICD-10-CM | POA: Diagnosis present

## 2020-02-16 DIAGNOSIS — F321 Major depressive disorder, single episode, moderate: Secondary | ICD-10-CM

## 2020-02-16 DIAGNOSIS — M79661 Pain in right lower leg: Secondary | ICD-10-CM | POA: Diagnosis not present

## 2020-02-16 DIAGNOSIS — I10 Essential (primary) hypertension: Secondary | ICD-10-CM | POA: Diagnosis present

## 2020-02-16 DIAGNOSIS — I110 Hypertensive heart disease with heart failure: Secondary | ICD-10-CM | POA: Diagnosis not present

## 2020-02-16 DIAGNOSIS — R188 Other ascites: Secondary | ICD-10-CM | POA: Diagnosis not present

## 2020-02-16 DIAGNOSIS — Z86711 Personal history of pulmonary embolism: Secondary | ICD-10-CM | POA: Diagnosis not present

## 2020-02-16 DIAGNOSIS — I517 Cardiomegaly: Secondary | ICD-10-CM | POA: Diagnosis not present

## 2020-02-16 DIAGNOSIS — I5023 Acute on chronic systolic (congestive) heart failure: Secondary | ICD-10-CM | POA: Diagnosis not present

## 2020-02-16 DIAGNOSIS — I82412 Acute embolism and thrombosis of left femoral vein: Secondary | ICD-10-CM | POA: Diagnosis present

## 2020-02-16 DIAGNOSIS — R109 Unspecified abdominal pain: Secondary | ICD-10-CM | POA: Diagnosis not present

## 2020-02-16 DIAGNOSIS — I5022 Chronic systolic (congestive) heart failure: Secondary | ICD-10-CM | POA: Diagnosis not present

## 2020-02-16 LAB — CBC WITH DIFFERENTIAL/PLATELET
Abs Immature Granulocytes: 0.04 10*3/uL (ref 0.00–0.07)
Basophils Absolute: 0.1 10*3/uL (ref 0.0–0.1)
Basophils Relative: 1 %
Eosinophils Absolute: 0.1 10*3/uL (ref 0.0–0.5)
Eosinophils Relative: 1 %
HCT: 47.6 % (ref 39.0–52.0)
Hemoglobin: 15.5 g/dL (ref 13.0–17.0)
Immature Granulocytes: 0 %
Lymphocytes Relative: 23 %
Lymphs Abs: 2.6 10*3/uL (ref 0.7–4.0)
MCH: 30.6 pg (ref 26.0–34.0)
MCHC: 32.6 g/dL (ref 30.0–36.0)
MCV: 93.9 fL (ref 80.0–100.0)
Monocytes Absolute: 0.8 10*3/uL (ref 0.1–1.0)
Monocytes Relative: 8 %
Neutro Abs: 7.4 10*3/uL (ref 1.7–7.7)
Neutrophils Relative %: 67 %
Platelets: 273 10*3/uL (ref 150–400)
RBC: 5.07 MIL/uL (ref 4.22–5.81)
RDW: 15.9 % — ABNORMAL HIGH (ref 11.5–15.5)
WBC: 11 10*3/uL — ABNORMAL HIGH (ref 4.0–10.5)
nRBC: 0 % (ref 0.0–0.2)

## 2020-02-16 LAB — COMPREHENSIVE METABOLIC PANEL
ALT: 77 U/L — ABNORMAL HIGH (ref 0–44)
AST: 36 U/L (ref 15–41)
Albumin: 3.7 g/dL (ref 3.5–5.0)
Alkaline Phosphatase: 62 U/L (ref 38–126)
Anion gap: 11 (ref 5–15)
BUN: 11 mg/dL (ref 6–20)
CO2: 24 mmol/L (ref 22–32)
Calcium: 8.7 mg/dL — ABNORMAL LOW (ref 8.9–10.3)
Chloride: 98 mmol/L (ref 98–111)
Creatinine, Ser: 1 mg/dL (ref 0.61–1.24)
GFR, Estimated: >60 mL/min (ref >60)
Glucose, Bld: 130 mg/dL — ABNORMAL HIGH (ref 70–99)
Potassium: 4.1 mmol/L (ref 3.5–5.1)
Sodium: 133 mmol/L — ABNORMAL LOW (ref 135–145)
Total Bilirubin: 1.7 mg/dL — ABNORMAL HIGH (ref 0.3–1.2)
Total Protein: 6.5 g/dL (ref 6.5–8.1)

## 2020-02-16 LAB — LIPASE, BLOOD: Lipase: 27 U/L (ref 11–51)

## 2020-02-16 LAB — TROPONIN I (HIGH SENSITIVITY): Troponin I (High Sensitivity): 50 ng/L — ABNORMAL HIGH (ref <18)

## 2020-02-16 LAB — PROTIME-INR
INR: 1.4 — ABNORMAL HIGH (ref 0.8–1.2)
Prothrombin Time: 17 seconds — ABNORMAL HIGH (ref 11.4–15.2)

## 2020-02-16 LAB — APTT: aPTT: 31 seconds (ref 24–36)

## 2020-02-16 LAB — HEPARIN LEVEL (UNFRACTIONATED)
Heparin Unfractionated: >3.6 IU/mL — ABNORMAL HIGH (ref 0.30–0.70)
Heparin Unfractionated: 0.15 IU/mL — ABNORMAL LOW (ref 0.30–0.70)

## 2020-02-16 LAB — RESP PANEL BY RT-PCR (FLU A&B, COVID) ARPGX2
Influenza A by PCR: NEGATIVE
Influenza B by PCR: NEGATIVE
SARS Coronavirus 2 by RT PCR: NEGATIVE

## 2020-02-16 LAB — BRAIN NATRIURETIC PEPTIDE: B Natriuretic Peptide: 2121.2 pg/mL — ABNORMAL HIGH (ref 0.0–100.0)

## 2020-02-16 LAB — LACTIC ACID, PLASMA
Lactic Acid, Venous: 2.6 mmol/L (ref 0.5–1.9)
Lactic Acid, Venous: 1.4 mmol/L (ref 0.5–1.9)

## 2020-02-16 MED ORDER — SODIUM CHLORIDE 0.9 % IV BOLUS
1000.0000 mL | Freq: Once | INTRAVENOUS | Status: AC
  Administered 2020-02-16: 1000 mL via INTRAVENOUS

## 2020-02-16 MED ORDER — ONDANSETRON HCL 4 MG PO TABS: 4.0000 mg | ORAL_TABLET | Freq: Four times a day (QID) | ORAL | Status: DC | PRN

## 2020-02-16 MED ORDER — HEPARIN BOLUS VIA INFUSION
5000.0000 [IU] | Freq: Once | INTRAVENOUS | Status: AC
  Administered 2020-02-16: 5000 [IU] via INTRAVENOUS
  Filled 2020-02-16: qty 5000

## 2020-02-16 MED ORDER — HYDROMORPHONE HCL 1 MG/ML IJ SOLN
0.5000 mg | INTRAMUSCULAR | Status: DC | PRN
  Administered 2020-02-16 – 2020-02-18 (×9): 0.5 mg via INTRAVENOUS
  Filled 2020-02-16 (×9): qty 1

## 2020-02-16 MED ORDER — FEBUXOSTAT 40 MG PO TABS
40.0000 mg | ORAL_TABLET | Freq: Every day | ORAL | Status: DC
  Administered 2020-02-16 – 2020-02-17 (×2): 40 mg via ORAL
  Filled 2020-02-16 (×5): qty 1

## 2020-02-16 MED ORDER — HEPARIN (PORCINE) 25000 UT/250ML-% IV SOLN
2300.0000 [IU]/h | INTRAVENOUS | Status: DC
  Administered 2020-02-16: 1650 [IU]/h via INTRAVENOUS
  Administered 2020-02-18: 2300 [IU]/h via INTRAVENOUS
  Filled 2020-02-16 (×5): qty 250

## 2020-02-16 MED ORDER — ONDANSETRON HCL 4 MG/2ML IJ SOLN
4.0000 mg | Freq: Four times a day (QID) | INTRAMUSCULAR | Status: DC | PRN
  Administered 2020-02-16 – 2020-02-19 (×7): 4 mg via INTRAVENOUS
  Filled 2020-02-16 (×7): qty 2

## 2020-02-16 MED ORDER — ONDANSETRON HCL 4 MG/2ML IJ SOLN
4.0000 mg | Freq: Once | INTRAMUSCULAR | Status: AC
  Administered 2020-02-16: 4 mg via INTRAVENOUS
  Filled 2020-02-16: qty 2

## 2020-02-16 MED ORDER — SODIUM CHLORIDE 0.9 % IV SOLN: INTRAVENOUS | Status: DC

## 2020-02-16 MED ORDER — HEPARIN BOLUS VIA INFUSION
3000.0000 [IU] | Freq: Once | INTRAVENOUS | Status: AC
  Administered 2020-02-16: 3000 [IU] via INTRAVENOUS
  Filled 2020-02-16: qty 3000

## 2020-02-16 MED ORDER — PIPERACILLIN-TAZOBACTAM 3.375 G IVPB
3.3750 g | Freq: Three times a day (TID) | INTRAVENOUS | Status: DC
  Administered 2020-02-16 – 2020-02-21 (×14): 3.375 g via INTRAVENOUS
  Filled 2020-02-16 (×14): qty 50

## 2020-02-16 MED ORDER — COLCHICINE 0.6 MG PO TABS
0.6000 mg | ORAL_TABLET | Freq: Two times a day (BID) | ORAL | Status: DC
Start: 2020-02-16 — End: 2020-02-20
  Administered 2020-02-16 – 2020-02-19 (×2): 0.6 mg via ORAL
  Filled 2020-02-16 (×6): qty 1

## 2020-02-16 MED ORDER — MORPHINE SULFATE (PF) 4 MG/ML IV SOLN
4.0000 mg | INTRAVENOUS | Status: DC | PRN
  Administered 2020-02-16 – 2020-02-17 (×2): 4 mg via INTRAVENOUS
  Filled 2020-02-16 (×2): qty 1

## 2020-02-16 MED ORDER — IOHEXOL 350 MG/ML SOLN
75.0000 mL | Freq: Once | INTRAVENOUS | Status: AC | PRN
  Administered 2020-02-16: 75 mL via INTRAVENOUS

## 2020-02-16 MED ORDER — PIPERACILLIN-TAZOBACTAM 3.375 G IVPB 30 MIN
3.3750 g | Freq: Once | INTRAVENOUS | Status: AC
  Administered 2020-02-16: 3.375 g via INTRAVENOUS
  Filled 2020-02-16: qty 50

## 2020-02-16 MED ORDER — MORPHINE SULFATE (PF) 2 MG/ML IV SOLN
2.0000 mg | INTRAVENOUS | Status: DC | PRN
Start: 2020-02-16 — End: 2020-02-22

## 2020-02-16 NOTE — ED Notes (Addendum)
Pt requesting something for nausea. Will administer PRN zofran

## 2020-02-16 NOTE — ED Notes (Signed)
Pt given lemon swabs for dry mouth.

## 2020-02-16 NOTE — ED Notes (Addendum)
This RN given ok by EDP to initiate heparin therapy.

## 2020-02-16 NOTE — ED Notes (Signed)
Admitting MD at bedside.

## 2020-02-16 NOTE — ED Notes (Signed)
This RN at bedside. Pt oxygen saturations intermittently decreasing to 84-88%. Pt asleep. Pt woken up with improvement to 95% on RA. Pt placed on 2L [place] for comfort while sleeping.

## 2020-02-16 NOTE — ED Notes (Signed)
Date and time results received: 02/16/20 10:17 AM  (use smartphrase ".now" to insert current time)  Test: Lactic  Critical Value: 2.3  Name of Provider Notified: Dr. Roxan Hockey   Orders Received? Or Actions Taken?: no new orders at this time

## 2020-02-16 NOTE — ED Notes (Signed)
Pt transitioned to hospital bed at this time.

## 2020-02-16 NOTE — ED Notes (Signed)
Pt given diet sprite per request. Pt denies further needs at this time.

## 2020-02-16 NOTE — Consult Note (Signed)
Pharmacy Antibiotic Note  Ryan Gibson is a 44 y.o. male admitted on 02/16/2020 with Intra-abdominal Infection.  Pharmacy has been consulted for Zosyn dosing. Patient received Zosyn in the ED @ 1021.  Plan: Zosyn 3.375 g (extended infusion) Q8H - will start at 1800 today   Height: 6\' 1"  (185.4 cm) Weight: 111.1 kg (245 lb) IBW/kg (Calculated) : 79.9  Temp (24hrs), Avg:98.1 F (36.7 C), Min:98.1 F (36.7 C), Max:98.1 F (36.7 C)  Recent Labs  Lab 02/10/20 1525 02/16/20 0939 02/16/20 1117  WBC 12.6* 11.0*  --   CREATININE 1.08 1.00  --   LATICACIDVEN  --  2.6* 1.4    Estimated Creatinine Clearance: 123.2 mL/min (by C-G formula based on SCr of 1 mg/dL).    Allergies  Allergen Reactions  . No Known Allergies   . Vancomycin Hives    hi8vess post op on R arm -site of infusion, and whole body itching    Antimicrobials this admission: 12/26 Zosyn >>     Thank you for allowing pharmacy to be a part of this patient's care.  1/27 02/16/2020 11:49 AM

## 2020-02-16 NOTE — H&P (Signed)
History and Physical    Bennet Spector JXB:[id number] DOB: [birth year] DOA: 02/16/2020  PCP: Wilson Singer, MD   Patient coming from: Home  I have personally briefly reviewed patient's old medical records in [facility]  Chief Complaint: Shortness of breath  HPI: Richrd Da is a 44 y.o. male with medical history significant for pain and swelling in his right leg for the last 1 month. He presents to the ER for evaluation 2 days of nausea and pain in the right upper quadrant and epigastrium as well as dry cough. Patient has a known history of gall bladder disease and was placed on oral antibiotic therapy and advised to follow up with surgery. He presents for evaluation of shortness of breath initially with exertion/activity but now short of breath at rest. He is unable to lay flat in bed. Shortness of breath is associated with pleuritic chest discomfort and bilateral leg swelling. He denies having any fever or chills, no dizziness or light headedness, no diarrhea, no constipation, no urinary symptoms, no headache or cough.  No extremity weakness, no blurry vision Labs shows sodium 133, potassium 4.1, chloride 98, Hco3 24, BUN 11, creatinine 1.00, calcium 8.7, alkaline phosphatase 62, albumin 3.7, AST 36, ALT 77, total protein 6.5, Total bilirubin 1.7, Lactic acid 2.6 >> 1.4, WBC 11, hemoglobin 15.5, HCT 47.6, MCV 93.9, RDW 15.9, PT 17.0, INR 1.4 Chest x ray reviewed by me shows enlargement of cardiac silhouette without acute infiltrate. Respiratory viral panel is negative CTA of the chest showed bilateral pulmonary emboli worst in the right lower lobe. No findings to suggest right heart strain are noted. Bilateral patchy infiltrates likely related to the underlying emboli and may represent early sequelae of pulmonary infarct. CT scan of the abdomen and pelvis done on 02/11/20 shows nonspecific gallbladder wall thickening versus pericholecystic fluid. No additional findings to  suggest acute cholecystitis. Right upper quadrant ultrasound is recommended for further evaluation. Small volume abdominopelvic ascites. Minor colonic diverticulosis Gall bladder ultrasound done on 02/11/20 showed gallbladder wall edema with pericholecystic free fluid and positive sonographic Murphy's sign. No gallstones or biliary dilatation visualized.Hepatic steatosis.  No focal hepatic lesion 12 Lead EKG reviewed by me shows sinus tachycardia with multiple PVC's    ED Course: Patient is a 44 year old male who presents to the ER for evaluation of shortness of breath associated with bilateral lower extremity swelling as well as nausea, vomiting and RUQ pain. Patient has a known history of acute cholecystitis and was referred to surgery as an out patient. He had a CTA of the chest which shows bilateral pulmonary emboli worst in the right lower lobe. No findings to suggest right heart strain are noted. Bilateral patchy infiltrates likely related to the underlying emboli and may represent early sequelae of pulmonary infarct. Patient was started on heparin drip and has been seen in consultation by surgery. He received a dose of IV Zosyn in the ER and will be admitted to the hospital for further evaluation.   Review of Systems: As per HPI otherwise all systems reviewed and negative.    Past Medical History:  Diagnosis Date  . Asthma    AS A CHILD ONLY  . Foot pain, right    painful x 1 month and swelling    Past Surgical History:  Procedure Laterality Date  . HERNIA REPAIR  07/13/2011   RIH  . INGUINAL HERNIA REPAIR  07/13/2011   Procedure: HERNIA REPAIR INGUINAL ADULT;  Surgeon: Wilmon Arms.  Corliss Skains, MD;  Location: [facility];  Service: General;  Laterality: Right;  . NO PREVIOUS SURGERY       reports that he has never smoked. His smokeless tobacco use includes chew. He reports current alcohol use. He reports that he does not use drugs.  Allergies  Allergen Reactions  . No Known  Allergies   . Vancomycin Hives    hi8vess post op on R arm -site of infusion, and whole body itching    Family History  Problem Relation Age of Onset  . Healthy Mother   . Heart disease Father      Prior to Admission medications   Medication Sig Start Date End Date Taking? Authorizing Provider  albuterol (VENTOLIN HFA) 108 (90 Base) MCG/ACT inhaler Inhale 1-2 puffs into the lungs every 6 (six) hours as needed for wheezing or shortness of breath. 02/07/20   Avegno, Zachery Dakins, FNP  amoxicillin-clavulanate (AUGMENTIN) 875-125 MG tablet Take 1 tablet by mouth 2 (two) times daily. 02/13/20   Elenore Paddy, NP  colchicine 0.6 MG tablet Take 1 tablet (0.6 mg total) by mouth 2 (two) times daily. 06/24/19   Merlyn Albert, MD  diclofenac (VOLTAREN) 75 MG EC tablet TAKE 1 TABLET(75 MG) BY MOUTH TWICE DAILY 01/11/20   Ladona Ridgel, Malena M, DO  febuxostat (ULORIC) 40 MG tablet Take 40 mg by mouth daily. 01/02/20   [provider]  HYDROcodone-acetaminophen (NORCO) 7.5-325 MG tablet hydrocodone 7.5 mg-acetaminophen 325 mg tablet  TAKE 1 TABLET BY MOUTH EVERY 6 HOURS AS NEEDED FOR PAIN Patient not taking: Reported on 02/10/2020    [provider]  HYDROcodone-acetaminophen (NORCO) 7.5-325 MG tablet Take 1 tablet by mouth every 6 (six) hours as needed. Patient not taking: Reported on 02/10/2020 12/11/19   [provider]  omeprazole (PRILOSEC) 40 MG capsule TAKE 1 CAPSULE(40 MG) BY MOUTH DAILY Patient not taking: Reported on 02/10/2020 01/23/20   Laroy Apple M, DO  predniSONE (STERAPRED UNI-PAK 21 TAB) 10 MG (21) TBPK tablet Take 6 tabs by mouth daily  for 1 days, then 5 tabs for 1 days, then 4 tabs for 1 days, then 3 tabs for 1 days, 2 tabs for 1 days, then 1 tab by mouth daily for 1 days 02/07/20   Durward Parcel, FNP    Physical Exam: Vitals:   02/16/20 1130 02/16/20 1145 02/16/20 1300 02/16/20 1327  BP: (!) 112/96     Pulse: (!) 110 (!) 109 98 95  Resp: (!) 23  (!) 22 19 19   Temp:      TempSrc:      SpO2: 94% 93% 97% 98%  Weight:      Height:         Vitals:   02/16/20 1130 02/16/20 1145 02/16/20 1300 02/16/20 1327  BP: (!) 112/96     Pulse: (!) 110 (!) 109 98 95  Resp: (!) 23 (!) 22 19 19   Temp:      TempSrc:      SpO2: 94% 93% 97% 98%  Weight:      Height:        Constitutional: NAD, alert and oriented x 3 Eyes: PERRL, lids and conjunctivae normal ENMT: Mucous membranes are moist.  Neck: normal, supple, no masses, no thyromegaly Respiratory: clear to auscultation bilaterally, no wheezing, no crackles. Normal respiratory effort. No accessory muscle use.  Cardiovascular: Tachycardic, no murmurs / rubs / gallops. traceextremity edema. 2+ pedal pulses. No carotid bruits.  Abdomen: Epigastrium/RUQ tenderness, no masses palpated.  No hepatosplenomegaly. Bowel sounds positive.  Musculoskeletal: no clubbing / cyanosis. No joint deformity upper and lower extremities.  Skin: no rashes, lesions, ulcers.  Neurologic: No gross focal neurologic deficit. Psychiatric: Normal mood and affect.   Labs on Admission: I have personally reviewed following labs and imaging studies  CBC: Recent Labs  Lab 02/10/20 1525 02/16/20 0939  WBC 12.6* 11.0*  NEUTROABS  --  7.4  HGB 15.0 15.5  HCT 45.3 47.6  MCV 92.6 93.9  PLT 396 273   Basic Metabolic Panel: Recent Labs  Lab 02/10/20 1525 02/16/20 0939  NA 139 133*  K 4.6 4.1  CL 103 98  CO2 25 24  GLUCOSE 142* 130*  BUN 20 11  CREATININE 1.08 1.00  CALCIUM 9.1 8.7*   GFR: Estimated Creatinine Clearance: 123.2 mL/min (by C-G formula based on SCr of 1 mg/dL). Liver Function Tests: Recent Labs  Lab 02/10/20 1525 02/16/20 0939  AST 40 36  ALT 70* 77*  ALKPHOS  --  62  BILITOT 0.8 1.7*  PROT 6.2 6.5  ALBUMIN  --  3.7   Recent Labs  Lab 02/16/20 0939  LIPASE 27   No results for input(s): AMMONIA in the last 168 hours. Coagulation Profile: Recent Labs  Lab 02/10/20 1525  02/16/20 0937  INR 1.3* 1.4*   Cardiac Enzymes: No results for input(s): CKTOTAL, CKMB, CKMBINDEX, TROPONINI in the last 168 hours. BNP (last 3 results) No results for input(s): PROBNP in the last 8760 hours. HbA1C: No results for input(s): HGBA1C in the last 72 hours. CBG: No results for input(s): GLUCAP in the last 168 hours. Lipid Profile: No results for input(s): CHOL, HDL, LDLCALC, TRIG, CHOLHDL, LDLDIRECT in the last 72 hours. Thyroid Function Tests: No results for input(s): TSH, T4TOTAL, FREET4, T3FREE, THYROIDAB in the last 72 hours. Anemia Panel: No results for input(s): VITAMINB12, FOLATE, FERRITIN, TIBC, IRON, RETICCTPCT in the last 72 hours. Urine analysis: No results found for: COLORURINE, APPEARANCEUR, LABSPEC, PHURINE, GLUCOSEU, HGBUR, BILIRUBINUR, KETONESUR, PROTEINUR, UROBILINOGEN, NITRITE, LEUKOCYTESUR  Radiological Exams on Admission: CT Angio Chest PE W and/or Wo Contrast  Result Date: 02/16/2020 CLINICAL DATA:  Shortness of breath and bilateral leg swelling EXAM: CT ANGIOGRAPHY CHEST WITH CONTRAST TECHNIQUE: Multidetector CT imaging of the chest was performed using the standard protocol during bolus administration of intravenous contrast. Multiplanar CT image reconstructions and MIPs were obtained to evaluate the vascular anatomy. CONTRAST:  75mL OMNIPAQUE IOHEXOL 350 MG/ML SOLN COMPARISON:  Chest x-ray from earlier in the same day. FINDINGS: Cardiovascular: Thoracic aorta shows no aneurysmal dilatation. The degree of opacification is limited precluding evaluation for dissection. Coronary calcifications are noted. Pulmonary artery is well visualized with bilateral pulmonary artery slightly greater in the right lower lobe. No right heart strain is seen. Heart is enlarged in size with prominence of the left ventricle. Mediastinum/Nodes: Thoracic inlet is within normal limits. No sizable hilar or mediastinal adenopathy is noted. The esophagus as visualized is within normal  limits. Lungs/Pleura: Small right-sided pleural effusion is noted. Patchy peripheral infiltrates are noted bilaterally likely related to the underlying pulmonary emboli as they are worst in the right lower lobe. These changes may represent early findings of pulmonary infarct. No sizable parenchymal nodule is seen. No other focal abnormality is noted. Upper Abdomen: Visualized upper abdomen demonstrates minimal free fluid. This is stable from a prior CT from 02/11/2020 Musculoskeletal: No chest wall abnormality. No acute or significant osseous findings. Review of the MIP images confirms the above findings. IMPRESSION: Bilateral pulmonary  emboli worst in the right lower lobe. No findings to suggest right heart strain are noted. Bilateral patchy infiltrates likely related to the underlying emboli and may represent early sequelae of pulmonary infarct. Critical Value/emergent results were called by telephone at the time of interpretation on 02/16/2020 at 10:49 am to Dr. Willy Eddy , who verbally acknowledged these results. Electronically Signed   By: Alcide Clever M.D.   On: 02/16/2020 10:53   DG Chest Port 1 View  Result Date: 02/16/2020 CLINICAL DATA:  Presented to hospital with abdominal pain, abnormal CT and ultrasound exams with findings suggesting acute cholecystitis. Suspected sepsis EXAM: PORTABLE CHEST 1 VIEW COMPARISON:  Portable exam 0945 hours compared to 02/07/2020 FINDINGS: Enlargement of cardiac silhouette. Mediastinal contours and pulmonary vascularity normal. Lungs clear. No infiltrate, pleural effusion or pneumothorax. Levoconvex upper thoracic scoliosis. IMPRESSION: Enlargement of cardiac silhouette without acute infiltrate. Electronically Signed   By: Ulyses Southward M.D.   On: 02/16/2020 10:00    EKG: Independently reviewed.  Sinus tachycardia with multiple PVCs  Assessment/Plan Principal Problem:   Pulmonary embolism (HCC) Active Problems:   Essential hypertension   Depression,  major, single episode, moderate (HCC)   Acute acalculous cholecystitis    Bilateral pulmonary emboli Patient presents for evaluation of shortness of breath and pleuritic chest pain and had a CT angiogram which shows bilateral pulmonary emboli Continue heparin drip protocol Obtain 2D echocardiogram to assess LVEF and rule out RV strain Obtain lower extremity Dopplers to rule out DVT    Acute acalculous cholecystitis Patient with complaints of right upper quadrant and epigastric pain with recent gallbladder ultrasound consistent with acalculus cholecystitis  Appreciate surgical input Continue Zosyn adjusted to renal function Interventional radiology consult for cholecystostomy tube placement    History of gout Continue Uloric acid and colchicine    DVT prophylaxis: Heparin Code Status: Full code Family Communication: Greater than 50% of time was spent discussing patient's condition and plan of care with him and his wife at the bedside.  All questions and concerns have been addressed.  They verbalized understanding and agree with plan. Disposition Plan: Back to previous home environment Consults called: Surgery/interventional radiology    Lucile Shutters MD [facility]     02/16/2020, 1:41 PM

## 2020-02-16 NOTE — ED Notes (Signed)
Advised by MD to hold heparin therapy until IR MD can consult pt.

## 2020-02-16 NOTE — Consult Note (Signed)
ANTICOAGULATION CONSULT NOTE   Pharmacy Consult for Heparin  Indication: pulmonary embolus  Allergies  Allergen Reactions  . No Known Allergies   . Vancomycin Hives    hi8vess post op on R arm -site of infusion, and whole body itching    Patient Measurements: Height: 6\' 1"  (185.4 cm) Weight: 111.1 kg (245 lb) IBW/kg (Calculated) : 79.9 Heparin Dosing Weight: 104 kg   Vital Signs: Temp: 98.1 F (36.7 C) (12/26 0941) Temp Source: Oral (12/26 0941) BP: 112/96 (12/26 1130) Pulse Rate: 109 (12/26 1145)  Labs: Recent Labs    02/16/20 0937 02/16/20 0939 02/16/20 1117  HGB  --  15.5  --   HCT  --  47.6  --   PLT  --  273  --   APTT  --   --  31  LABPROT 17.0*  --   --   INR 1.4*  --   --   CREATININE  --  1.00  --     Estimated Creatinine Clearance: 123.2 mL/min (by C-G formula based on SCr of 1 mg/dL).   Medical History: Past Medical History:  Diagnosis Date  . Asthma    AS A CHILD ONLY  . Foot pain, right    painful x 1 month and swelling    Medications:  Confirmed with patient no anticoagulants at this time.    Assessment: Pharmacy has been consulted for heparin dosing in a patient who presented to the ED for worsening shortness of breath as well was epigastric and right upper quadrant pain.    Goal of Therapy:  Heparin level 0.3-0.7 units/ml Monitor platelets by anticoagulation protocol: Yes   Plan:  Baseline labs have been ordered  Heparin DW: 104 kg  Give 5000 units bolus x 1 Start heparin infusion at 1650 units/hr Check anti-Xa level in 6 hours and daily while on heparin, per protocol Continue to monitor H&H and platelets daily while on heparin   Halston Kintz R Milly Goggins 02/16/2020,11:53 AM

## 2020-02-16 NOTE — ED Triage Notes (Signed)
Pt to ED via POV from home. Pt c/o SOB for a few months with no improvement and bilateral leg swelling. Pt stating breathing tx at home with no relief. Pt c/o nausea x2 days and dry cough. Pt with of gallbladder issues but has not scheduled surgery. Pt stating unable to assume normal activity for last few months. Pt stating unable to take a full deep breath or lay down breath.

## 2020-02-16 NOTE — ED Provider Notes (Signed)
[facility] Emergency Department Provider Note    Event Date/Time   First MD Initiated Contact with Patient 02/16/20 [phone]     (approximate)  I have reviewed the triage vital signs and the nursing notes.   HISTORY  Chief Complaint No chief complaint on file.    HPI Ryan Gibson is a 44 y.o. male [place] past medical history presents to the ER for worsening shortness of breath discomfort with taking deep inspiration and as well as progressively worsening epigastric and right upper quadrant pain.  Was seen by the PCP for this this past week with what appears to be ultrasound imaging and CT imaging with evidence of acute cholecystitis.  Was given prescription for Augmentin with outpatient referral to surgery but patient has continued to decline.  Is also recently post Covid illness and has had prolonged immobilization due to knee pain.  Does describe pleuritic chest discomfort.  No history of PE or DVT.    Past Medical History:  Diagnosis Date  . Asthma    AS A CHILD ONLY  . Foot pain, right    painful x 1 month and swelling   Family History  Problem Relation Age of Onset  . Healthy Mother   . Heart disease Father    Past Surgical History:  Procedure Laterality Date  . HERNIA REPAIR  07/13/2011   RIH  . INGUINAL HERNIA REPAIR  07/13/2011   Procedure: HERNIA REPAIR INGUINAL ADULT;  Surgeon: Wilmon ArmsMatthew K. Corliss Skainssuei, MD;  Location: [facility];  Service: General;  Laterality: Right;  . NO PREVIOUS SURGERY     Patient Active Problem List   Diagnosis Date Noted  . Pulmonary embolism (HCC) 02/16/2020  . Depression, major, single episode, moderate (HCC) 03/07/2018  . Hypogonadism male 11/24/2015  . Gout 11/01/2014  . Essential hypertension 03/10/2014  . Right inguinal hernia 06/28/2011      Prior to Admission medications   Medication Sig Start Date End Date Taking? Authorizing Provider  albuterol (VENTOLIN HFA) 108 (90 Base) MCG/ACT inhaler Inhale 1-2  puffs into the lungs every 6 (six) hours as needed for wheezing or shortness of breath. 02/07/20   Avegno, Zachery DakinsKomlanvi S, FNP  amoxicillin-clavulanate (AUGMENTIN) 875-125 MG tablet Take 1 tablet by mouth 2 (two) times daily. 02/13/20   Elenore PaddyGray, Sarah E, NP  colchicine 0.6 MG tablet Take 1 tablet (0.6 mg total) by mouth 2 (two) times daily. 06/24/19   Merlyn AlbertLuking, William S, MD  diclofenac (VOLTAREN) 75 MG EC tablet TAKE 1 TABLET(75 MG) BY MOUTH TWICE DAILY 01/11/20   Ladona Ridgelaylor, Malena M, DO  febuxostat (ULORIC) 40 MG tablet Take 40 mg by mouth daily. 01/02/20   [provider]  HYDROcodone-acetaminophen (NORCO) 7.5-325 MG tablet hydrocodone 7.5 mg-acetaminophen 325 mg tablet  TAKE 1 TABLET BY MOUTH EVERY 6 HOURS AS NEEDED FOR PAIN Patient not taking: Reported on 02/10/2020    [provider]  HYDROcodone-acetaminophen (NORCO) 7.5-325 MG tablet Take 1 tablet by mouth every 6 (six) hours as needed. Patient not taking: Reported on 02/10/2020 12/11/19   [provider]  omeprazole (PRILOSEC) 40 MG capsule TAKE 1 CAPSULE(40 MG) BY MOUTH DAILY Patient not taking: Reported on 02/10/2020 01/23/20   Laroy Appleaylor, Malena M, DO  predniSONE (STERAPRED UNI-PAK 21 TAB) 10 MG (21) TBPK tablet Take 6 tabs by mouth daily  for 1 days, then 5 tabs for 1 days, then 4 tabs for 1 days, then 3 tabs for 1 days, 2 tabs for 1 days, then 1  tab by mouth daily for 1 days 02/07/20   Durward Parcel, FNP    Allergies No known allergies and Vancomycin    Social History Social History   Tobacco Use  . Smoking status: Never Smoker  . Smokeless tobacco: Current User    Types: Chew  Vaping Use  . Vaping Use: Never used  Substance Use Topics  . Alcohol use: Yes    Comment: Social  . Drug use: No    Review of Systems Patient denies headaches, rhinorrhea, blurry vision, numbness, shortness of breath, chest pain, edema, cough, abdominal pain, nausea, vomiting, diarrhea, dysuria, fevers, rashes or hallucinations  unless otherwise stated above in HPI. ____________________________________________   PHYSICAL EXAM:  VITAL SIGNS: Vitals:   02/16/20 1130 02/16/20 1145  BP: (!) 112/96   Pulse: (!) 110 (!) 109  Resp: (!) 23 (!) 22  Temp:    SpO2: 94% 93%    Constitutional: Alert and oriented.  Eyes: Conjunctivae are normal.  Head: Atraumatic. Nose: No congestion/rhinnorhea. Mouth/Throat: Mucous membranes are moist.   Neck: No stridor. Painless ROM.  Cardiovascular: mildly tachycardic rate, regular rhythm. Grossly normal heart sounds.  Good peripheral circulation. Respiratory: Normal respiratory effort.  No retractions. Lungs CTAB. Gastrointestinal: Soft and nontender. No distention. No abdominal bruits. No CVA tenderness. Genitourinary: deferred Musculoskeletal: No lower extremity tenderness nor edema.  No joint effusions. Neurologic:  Normal speech and language. No gross focal neurologic deficits are appreciated. No facial droop Skin:  Skin is warm, dry and intact. No rash noted. Psychiatric: Mood and affect are normal. Speech and behavior are normal.  ____________________________________________   LABS (all labs ordered are listed, but only abnormal results are displayed)  Results for orders placed or performed during the hospital encounter of 02/16/20 (from the past 24 hour(s))  Protime-INR     Status: Abnormal   Collection Time: 02/16/20  9:37 AM  Result Value Ref Range   Prothrombin Time 17.0 (H) 11.4 - 15.2 seconds   INR 1.4 (H) 0.8 - 1.2  CBC with Differential     Status: Abnormal   Collection Time: 02/16/20  9:39 AM  Result Value Ref Range   WBC 11.0 (H) 4.0 - 10.5 K/uL   RBC 5.07 4.22 - 5.81 MIL/uL   Hemoglobin 15.5 13.0 - 17.0 g/dL   HCT 69.6 29.5 - 28.4 %   MCV 93.9 80.0 - 100.0 fL   MCH 30.6 26.0 - 34.0 pg   MCHC 32.6 30.0 - 36.0 g/dL   RDW 13.2 (H) 44.0 - 10.2 %   Platelets 273 150 - 400 K/uL   nRBC 0.0 0.0 - 0.2 %   Neutrophils Relative % 67 %   Neutro Abs 7.4 1.7  - 7.7 K/uL   Lymphocytes Relative 23 %   Lymphs Abs 2.6 0.7 - 4.0 K/uL   Monocytes Relative 8 %   Monocytes Absolute 0.8 0.1 - 1.0 K/uL   Eosinophils Relative 1 %   Eosinophils Absolute 0.1 0.0 - 0.5 K/uL   Basophils Relative 1 %   Basophils Absolute 0.1 0.0 - 0.1 K/uL   Immature Granulocytes 0 %   Abs Immature Granulocytes 0.04 0.00 - 0.07 K/uL  Comprehensive metabolic panel     Status: Abnormal   Collection Time: 02/16/20  9:39 AM  Result Value Ref Range   Sodium 133 (L) 135 - 145 mmol/L   Potassium 4.1 3.5 - 5.1 mmol/L   Chloride 98 98 - 111 mmol/L   CO2 24 22 - 32 mmol/L  Glucose, Bld 130 (H) 70 - 99 mg/dL   BUN 11 6 - 20 mg/dL   Creatinine, Ser 0.93 0.61 - 1.24 mg/dL   Calcium 8.7 (L) 8.9 - 10.3 mg/dL   Total Protein 6.5 6.5 - 8.1 g/dL   Albumin 3.7 3.5 - 5.0 g/dL   AST 36 15 - 41 U/L   ALT 77 (H) 0 - 44 U/L   Alkaline Phosphatase 62 38 - 126 U/L   Total Bilirubin 1.7 (H) 0.3 - 1.2 mg/dL   GFR, Estimated >81 >82 mL/min   Anion gap 11 5 - 15  Lipase, blood     Status: None   Collection Time: 02/16/20  9:39 AM  Result Value Ref Range   Lipase 27 11 - 51 U/L  Lactic acid, plasma     Status: Abnormal   Collection Time: 02/16/20  9:39 AM  Result Value Ref Range   Lactic Acid, Venous 2.6 (HH) 0.5 - 1.9 mmol/L  Resp Panel by RT-PCR (Flu A&B, Covid) Nasopharyngeal Swab     Status: None   Collection Time: 02/16/20  9:52 AM   Specimen: Nasopharyngeal Swab; Nasopharyngeal(NP) swabs in vial transport medium  Result Value Ref Range   SARS Coronavirus 2 by RT PCR NEGATIVE NEGATIVE   Influenza A by PCR NEGATIVE NEGATIVE   Influenza B by PCR NEGATIVE NEGATIVE  Lactic acid, plasma     Status: None   Collection Time: 02/16/20 11:17 AM  Result Value Ref Range   Lactic Acid, Venous 1.4 0.5 - 1.9 mmol/L  APTT     Status: None   Collection Time: 02/16/20 11:17 AM  Result Value Ref Range   aPTT 31 24 - 36 seconds   ____________________________________________  EKG My review  and personal interpretation at Time: 10:49   Indication: sob  Rate: 110  Rhythm: sinus Axis: normal Other: normal intervals, no stemi, occasional pvc ____________________________________________  RADIOLOGY  I personally reviewed all radiographic images ordered to evaluate for the above acute complaints and reviewed radiology reports and findings.  These findings were personally discussed with the patient.  Please see medical record for radiology report.  ____________________________________________   PROCEDURES  Procedure(s) performed:  .Critical Care Performed by: Willy Eddy, MD Authorized by: Willy Eddy, MD   Critical care provider statement:    Critical care time (minutes):  35   Critical care time was exclusive of:  Separately billable procedures and treating other patients   Critical care was necessary to treat or prevent imminent or life-threatening deterioration of the following conditions:  Circulatory failure   Critical care was time spent personally by me on the following activities:  Development of treatment plan with patient or surrogate, discussions with consultants, evaluation of patient's response to treatment, examination of patient, obtaining history from patient or surrogate, ordering and performing treatments and interventions, ordering and review of laboratory studies, ordering and review of radiographic studies, pulse oximetry, re-evaluation of patient's condition and review of old charts      Critical Care performed: yes ____________________________________________   INITIAL IMPRESSION / ASSESSMENT AND PLAN / ED COURSE  Pertinent labs & imaging results that were available during my care of the patient were reviewed by me and considered in my medical decision making (see chart for details).   DDX: Cholecystitis, cholelithiasis, perforation, pneumonia, Covid, PE, ACS  Ryan Gibson is a 44 y.o. who presents to the ED with presentation as  described above.  Patient is quite tender right upper quadrant concerning for persistent  acute cholecystitis mildly tachycardic complaining of worsening shortness of breath and pleuritic discomfort possible recent COVID-19 illness that he never was tested was living in a household with positive members.  Has been taking Augmentin.  Clinical Course as of 02/16/20 1156  Sun Feb 16, 2020  1053 CTA was ordered to evaluate for suspected PE and does show evidence of bilateral PE without evidence of right heart strain.  Will need anticoagulation but given his evidence of acute cholecystitis via ultrasound on the 22nd I consulted with general surgery.  Given the duration of symptoms would be poor surgical candidate at this time now even more so in the setting of PE.  We will plan discussion with IR for PERC drain.  As he is hemodynamically stable I have ordered heparin but they have asked that we hold heparinization pending further discussion with IR. [PR]    Clinical Course User Index [PR] Willy Eddy, MD   IR will plan [facility] tomorrow.  Have initiated heparin.  Have discussed with hospitalist for admission for continued IV antibiotics IV fluids and medical management.  The patient was evaluated in Emergency Department today for the symptoms described in the history of present illness. He/she was evaluated in the context of the global COVID-19 pandemic, which necessitated consideration that the patient might be at risk for infection with the SARS-CoV-2 virus that causes COVID-19. Institutional protocols and algorithms that pertain to the evaluation of patients at risk for COVID-19 are in a state of rapid change based on information released by regulatory bodies including the CDC and federal and state organizations. These policies and algorithms were followed during the patient's care in the ED.  As part of my medical decision making, I reviewed the following data within the electronic MEDICAL RECORD NUMBER  Nursing notes reviewed and incorporated, Labs reviewed, notes from prior ED visits and [place] Controlled Substance Database   ____________________________________________   FINAL CLINICAL IMPRESSION(S) / ED DIAGNOSES  Final diagnoses:  Sepsis (HCC)  Cholecystitis      NEW MEDICATIONS STARTED DURING THIS VISIT:  New Prescriptions   No medications on file     Note:  This document was prepared using Dragon voice recognition software and may include unintentional dictation errors.    Willy Eddy, MD 02/16/20 [phone]

## 2020-02-16 NOTE — Consult Note (Signed)
SURGICAL CONSULTATION NOTE   HISTORY OF PRESENT ILLNESS (HPI):  44 y.o. male presented to [facility] ED for evaluation of shortness of breath. Patient reports he started having abdominal pain since 2 months ago.  He started a relationship with a PCP a week ago.  He was complaining of right upper current pain at that moment.  He had a CT scan of the abdomen and pelvis done that shows severe gallbladder wall thickening with pericholecystic fluid.  This was confirmed with abdominal ultrasound.  At that time he was recommended to see a surgeon soon as possible and was started with Augmentin.  He was unable to have an appointment with surgeon.  Due to increased shortness of breath he decided to come to the emergency room.  He also was found with tachycardia.  ED physician decided to do a CTA of the chest to rule out PE.  Pulmonary embolism was confirmed.  This seems to be a provoked PE due to decreased ambulation due to right knee pain and plan of surgery.  I personally evaluated the images of the CT scan of the abdomen and pelvis, the abdominal ultrasound and today's CT scan of the chest.  Surgery is consulted by Dr. Roxan Hockey in this context for evaluation and management of acute cholecystitis.  PAST MEDICAL HISTORY (PMH):  Past Medical History:  Diagnosis Date  . Asthma    AS A CHILD ONLY  . Foot pain, right    painful x 1 month and swelling     PAST SURGICAL HISTORY (PSH):  Past Surgical History:  Procedure Laterality Date  . HERNIA REPAIR  07/13/2011   RIH  . INGUINAL HERNIA REPAIR  07/13/2011   Procedure: HERNIA REPAIR INGUINAL ADULT;  Surgeon: Wilmon Arms. Corliss Skains, MD;  Location: [facility];  Service: General;  Laterality: Right;  . NO PREVIOUS SURGERY       MEDICATIONS:  Prior to Admission medications   Medication Sig Start Date End Date Taking? Authorizing Provider  albuterol (VENTOLIN HFA) 108 (90 Base) MCG/ACT inhaler Inhale 1-2 puffs into the lungs every 6 (six) hours as needed for wheezing  or shortness of breath. 02/07/20   Avegno, Zachery Dakins, FNP  amoxicillin-clavulanate (AUGMENTIN) 875-125 MG tablet Take 1 tablet by mouth 2 (two) times daily. 02/13/20   Elenore Paddy, NP  colchicine 0.6 MG tablet Take 1 tablet (0.6 mg total) by mouth 2 (two) times daily. 06/24/19   Merlyn Albert, MD  diclofenac (VOLTAREN) 75 MG EC tablet TAKE 1 TABLET(75 MG) BY MOUTH TWICE DAILY 01/11/20   Ladona Ridgel, Malena M, DO  febuxostat (ULORIC) 40 MG tablet Take 40 mg by mouth daily. 01/02/20   [provider]  HYDROcodone-acetaminophen (NORCO) 7.5-325 MG tablet hydrocodone 7.5 mg-acetaminophen 325 mg tablet  TAKE 1 TABLET BY MOUTH EVERY 6 HOURS AS NEEDED FOR PAIN Patient not taking: Reported on 02/10/2020    [provider]  HYDROcodone-acetaminophen (NORCO) 7.5-325 MG tablet Take 1 tablet by mouth every 6 (six) hours as needed. Patient not taking: Reported on 02/10/2020 12/11/19   [provider]  omeprazole (PRILOSEC) 40 MG capsule TAKE 1 CAPSULE(40 MG) BY MOUTH DAILY Patient not taking: Reported on 02/10/2020 01/23/20   Laroy Apple M, DO  predniSONE (STERAPRED UNI-PAK 21 TAB) 10 MG (21) TBPK tablet Take 6 tabs by mouth daily  for 1 days, then 5 tabs for 1 days, then 4 tabs for 1 days, then 3 tabs for 1 days, 2 tabs for 1 days, then 1 tab by  mouth daily for 1 days 02/07/20   Durward Parcel, FNP     ALLERGIES:  Allergies  Allergen Reactions  . No Known Allergies   . Vancomycin Hives    hi8vess post op on R arm -site of infusion, and whole body itching     SOCIAL HISTORY:  Social History   Socioeconomic History  . Marital status: Married    Spouse name: Not on file  . Number of children: Not on file  . Years of education: Not on file  . Highest education level: Not on file  Occupational History  . Not on file  Tobacco Use  . Smoking status: Never Smoker  . Smokeless tobacco: Current User    Types: Chew  Vaping Use  . Vaping Use: Never used  Substance and  Sexual Activity  . Alcohol use: Yes    Comment: Social  . Drug use: No  . Sexual activity: Yes    Birth control/protection: None    Comment: Married  Other Topics Concern  . Not on file  Social History Narrative   Married for 13 years.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   Social Determinants of Health   Financial Resource Strain: Not on file  Food Insecurity: Not on file  Transportation Needs: Not on file  Physical Activity: Not on file  Stress: Not on file  Social Connections: Not on file  Intimate Partner Violence: Not on file      FAMILY HISTORY:  Family History  Problem Relation Age of Onset  . Healthy Mother   . Heart disease Father      REVIEW OF SYSTEMS:  Constitutional: denies weight loss, fever, chills, or sweats  Eyes: denies any other vision changes, history of eye injury  ENT: denies sore throat, hearing problems  Respiratory: Positive shortness of breath, negative wheezing  Cardiovascular: denies chest pain, palpitations  Gastrointestinal: Positive abdominal pain, nausea and vomiting Genitourinary: denies burning with urination or urinary frequency Musculoskeletal: Positive right knee pain Skin: denies any other rashes or skin discolorations  Neurological: denies any other headache, dizziness, weakness  Psychiatric: denies any other depression, anxiety   All other review of systems were negative   VITAL SIGNS:  Temp:  [98.1 F (36.7 C)] 98.1 F (36.7 C) (12/26 0941) Pulse Rate:  [44-119] 44 (12/26 1115) Resp:  [19-28] 24 (12/26 1115) BP: (116)/(90) 116/90 (12/26 0954) SpO2:  [91 %-100 %] 91 % (12/26 1115) Weight:  [111.1 kg-113.5 kg] 111.1 kg (12/26 0941)     Height: 6\' 1"  (185.4 cm) Weight: 111.1 kg BMI (Calculated): 32.33   INTAKE/OUTPUT:  This shift: No intake/output data recorded.  Last 2 shifts: @IOLAST2SHIFTS @   PHYSICAL EXAM:  Constitutional:  -- Normal body habitus  -- Awake, alert, and oriented x3   Eyes:  -- Pupils equally round and reactive to light  -- No scleral icterus  Ear, nose, and throat:  -- No jugular venous distension  Pulmonary:  -- No crackles  -- Equal breath sounds bilaterally -- Breathing non-labored at rest Cardiovascular:  -- S1, S2 present  -- No pericardial rubs Gastrointestinal:  -- Abdomen soft, tender in right upper quadrant, non-distended, no guarding or rebound tenderness -- No abdominal masses appreciated, pulsatile or otherwise  Musculoskeletal and Integumentary:  -- Wounds: None appreciated -- Extremities: Bilateral lower extremity edema Neurologic:  -- Motor function: intact and symmetric -- Sensation: intact and symmetric   Labs:  CBC Latest Ref Rng & Units 02/16/2020 02/10/2020 11/20/2019  WBC 4.0 - 10.5 K/uL 11.0(H) 12.6(H) 6.6  Hemoglobin 13.0 - 17.0 g/dL 95.1 88.4 16.6  Hematocrit 39.0 - 52.0 % 47.6 45.3 45.2  Platelets 150 - 400 K/uL 273 396 243   CMP Latest Ref Rng & Units 02/16/2020 02/10/2020 11/20/2019  Glucose 70 - 99 mg/dL 063(K) 160(F) 093(A)  BUN 6 - 20 mg/dL 11 20 15   Creatinine 0.61 - 1.24 mg/dL 3.55 7.32  Sodium 135 - 145 mmol/L 133(L) 139 139  Potassium 3.5 - 5.1 mmol/L 4.1 4.6 4.8  Chloride 98 - 111 mmol/L 98 103 102  CO2 22 - 32 mmol/L 24 25 25   Calcium 8.9 - 10.3 mg/dL 2.02) 9.1 9.2  Total Protein 6.5 - 8.1 g/dL 6.5 6.2 7.1  Total Bilirubin 0.3 - 1.2 mg/dL ) 0.8 0.9  Alkaline Phos 38 - 126 U/L 62 - 56  AST 15 - 41 U/L 36 40 46(H)  ALT 0 - 44 U/L 77(H) 70(H) 81(H)     Imaging studies:  EXAM: ULTRASOUND ABDOMEN LIMITED RIGHT UPPER QUADRANT  COMPARISON:  02/11/2020 CT.  FINDINGS: Gallbladder:  No gallstones visualized. Diffuse gallbladder wall thickening/edema measuring up to 1.3 cm. Trace pericholecystic free fluid. Sonographic Murphy sign was positive.  Common bile duct:  Diameter: 6 mm  Liver:  No focal lesion identified. Increased parenchymal echogenicity. Portal vein is  patent on color Doppler imaging with normal direction of blood flow towards the liver.  Other: None.  IMPRESSION: Gallbladder wall edema with pericholecystic free fluid and positive sonographic Murphy's sign.  No gallstones or biliary dilatation visualized.  Hepatic steatosis.  No focal hepatic lesion.  Electronically Signed   By: 7.0(W M.D.   On: 02/12/2020 12:54   Assessment/Plan:  45 y.o. male with acute versus subacute cholecystitis, complicated by pertinent comorbidities including acute pulmonary embolism.  Patient with acute cholecystitis of more than a week of onset.  Severe gallbladder with thickening and pericholecystic fluid and positive 02/14/2020 sign was seen on ultrasound on 02/12/2020.  Patient has not been responding to oral antibiotic therapy as outpatient.  The initial presentation in the ED was due to shortness of breath.  CTA of the chest confirmed PE.  Due to the time of onset of the cholecystitis plus the acute PE with shortness of breath patient is a high risk for complication of anesthesia and cholecystectomy.  I have recommended to proceed with percutaneous cholecystostomy as a treatment of cholecystitis.  I discussed the case with interventional radiologist and they agreed to start heparin drip and they will coordinate about the timing of holding anticoagulation for procedure.  Agreed to start patient on IV antibiotic therapy.  I will continue to follow along.   Eulah Pont, MD

## 2020-02-16 NOTE — Progress Notes (Signed)
*  PRELIMINARY RESULTS* Echocardiogram 2D Echocardiogram has been performed.  Ryan Gibson 02/16/2020, 3:14 PM

## 2020-02-16 NOTE — Consult Note (Signed)
ANTICOAGULATION CONSULT NOTE   Pharmacy Consult for Heparin  Indication: pulmonary embolus  Patient Measurements: Heparin Dosing Weight: 104 kg   Labs: Recent Labs    02/16/20 0937 02/16/20 0939 02/16/20 1117 02/16/20 1835  HGB  --  15.5  --   --   HCT  --  47.6  --   --   PLT  --  273  --   --   APTT  --   --  31  --   LABPROT 17.0*  --   --   --   INR 1.4*  --   --   --   HEPARINUNFRC  --   --   --  >3.60*  CREATININE  --  1.00  --   --   TROPONINIHS  --  50*  --   --     Estimated Creatinine Clearance: 123.2 mL/min (by C-G formula based on SCr of 1 mg/dL).   Medical History: Past Medical History:  Diagnosis Date   Asthma    AS A CHILD ONLY   Foot pain, right    painful x 1 month and swelling    Medications:  Confirmed with patient no anticoagulants at this time.    Assessment: Patient is a 44 y/o M with medical history as above who presented to the ED 12/26 with shortness of breath and bilateral leg swelling. Subsequently found to have bilateral pulmonary emboli as well as non-occlusive thrombus in left common femoral vein. Pharmacy consulted to initiate heparin infusion for PE.  Baseline aPTT 31 seconds, INR 1.4. Baseline CBC within normal limits.  Heparin course: 12/26 at 1121: IV heparin 5000 unit bolus x 1 followed by continuous infusion at 1650 units/hr 12/26 at 1835 HL > 3.6, supratherapeutic. RN held drip at 2005 12/26 at 2045 HL 0.15 (re-check with drip off).   Goal of Therapy:  Heparin level 0.3-0.7 units/ml Monitor platelets by anticoagulation protocol: Yes   Plan:  --Suspect heparin level > 3.6 to be erroneous given level decreased to 0.15 within 40 minutes of drip on hold --Will re-bolus 3000 unit IV x 1 and decrease heparin infusion rate to 1400 units/hr --Re-check heparin level 6 hours after re-initiation of infusion --Daily CBC per protocol while on heparin drip  Tressie Ellis 02/16/2020,8:18 PM

## 2020-02-17 DIAGNOSIS — K810 Acute cholecystitis: Secondary | ICD-10-CM | POA: Diagnosis not present

## 2020-02-17 DIAGNOSIS — I2699 Other pulmonary embolism without acute cor pulmonale: Secondary | ICD-10-CM | POA: Diagnosis not present

## 2020-02-17 LAB — HEPARIN LEVEL (UNFRACTIONATED)
Heparin Unfractionated: 0.21 IU/mL — ABNORMAL LOW (ref 0.30–0.70)
Heparin Unfractionated: <0.1 IU/mL — ABNORMAL LOW (ref 0.30–0.70)
Heparin Unfractionated: 0.16 IU/mL — ABNORMAL LOW (ref 0.30–0.70)

## 2020-02-17 LAB — ECHOCARDIOGRAM COMPLETE
AR max vel: 1.83 cm2
AV Peak grad: 4.8 mmHg
Ao pk vel: 1.1 m/s
Area-P 1/2: 6.83 cm2
Calc EF: 16.7 %
Height: 73 in
S' Lateral: 6.89 cm
Single Plane A2C EF: 18.6 %
Single Plane A4C EF: 19.8 %
Weight: 3920 oz

## 2020-02-17 LAB — BASIC METABOLIC PANEL
Anion gap: 7 (ref 5–15)
BUN: 12 mg/dL (ref 6–20)
CO2: 23 mmol/L (ref 22–32)
Calcium: 8.2 mg/dL — ABNORMAL LOW (ref 8.9–10.3)
Chloride: 103 mmol/L (ref 98–111)
Creatinine, Ser: 0.92 mg/dL (ref 0.61–1.24)
GFR, Estimated: >60 mL/min (ref >60)
Glucose, Bld: 99 mg/dL (ref 70–99)
Potassium: 4.5 mmol/L (ref 3.5–5.1)
Sodium: 133 mmol/L — ABNORMAL LOW (ref 135–145)

## 2020-02-17 LAB — MRSA PCR SCREENING: MRSA by PCR: NEGATIVE

## 2020-02-17 LAB — CBC
HCT: 45.2 % (ref 39.0–52.0)
Hemoglobin: 14.6 g/dL (ref 13.0–17.0)
MCH: 30.6 pg (ref 26.0–34.0)
MCHC: 32.3 g/dL (ref 30.0–36.0)
MCV: 94.8 fL (ref 80.0–100.0)
Platelets: 229 10*3/uL (ref 150–400)
RBC: 4.77 MIL/uL (ref 4.22–5.81)
RDW: 15.9 % — ABNORMAL HIGH (ref 11.5–15.5)
WBC: 9.9 10*3/uL (ref 4.0–10.5)
nRBC: 0 % (ref 0.0–0.2)

## 2020-02-17 LAB — HIV ANTIBODY (ROUTINE TESTING W REFLEX): HIV Screen 4th Generation wRfx: NONREACTIVE

## 2020-02-17 MED ORDER — HEPARIN BOLUS VIA INFUSION
3000.0000 [IU] | Freq: Once | INTRAVENOUS | Status: AC
  Administered 2020-02-17: 3000 [IU] via INTRAVENOUS
  Filled 2020-02-17: qty 3000

## 2020-02-17 MED ORDER — SODIUM CHLORIDE 0.9 % IV SOLN
INTRAVENOUS | Status: DC | PRN
  Administered 2020-02-17: 250 mL via INTRAVENOUS
  Administered 2020-02-18: 50 mL via INTRAVENOUS
  Administered 2020-02-19: 250 mL via INTRAVENOUS
  Administered 2020-02-19: 50 mL via INTRAVENOUS
  Administered 2020-02-19: 10 mL via INTRAVENOUS
  Administered 2020-02-19: 50 mL via INTRAVENOUS

## 2020-02-17 MED ORDER — PROMETHAZINE HCL 25 MG/ML IJ SOLN
25.0000 mg | Freq: Four times a day (QID) | INTRAMUSCULAR | Status: DC | PRN
  Administered 2020-02-17 – 2020-02-18 (×3): 25 mg via INTRAVENOUS
  Filled 2020-02-17 (×3): qty 1

## 2020-02-17 NOTE — Progress Notes (Addendum)
findings of pulmonary infarct. No sizable parenchymal nodule is seen. No other focal abnormality is noted. Upper Abdomen: Visualized upper abdomen demonstrates minimal free fluid. This is stable from a prior CT from 02/11/2020 Musculoskeletal: No chest wall abnormality. No acute or significant osseous findings. Review of the MIP images confirms the above findings. IMPRESSION: Bilateral pulmonary emboli worst in the right lower lobe. No findings to suggest right heart strain are noted. Bilateral patchy infiltrates likely related to the underlying emboli and may represent early sequelae of pulmonary infarct. Critical Value/emergent results were called by telephone at the time of interpretation on 02/16/2020 at 10:49 am to Dr. Willy Eddy , who verbally acknowledged these results. Electronically Signed   By: Alcide Clever M.D.   On: 02/16/2020 10:53   US Venous Img Lower Bilateral (DVT)  Result Date: 02/16/2020 CLINICAL DATA:  BILATERAL leg pain, pulmonary emboli on CT angio chest EXAM: BILATERAL LOWER EXTREMITY VENOUS DOPPLER ULTRASOUND TECHNIQUE: Gray-scale sonography with compression, as well as color and duplex ultrasound, were performed to evaluate the deep venous system(s) from the level of the common femoral vein through  the popliteal and proximal calf veins. COMPARISON:  None FINDINGS: VENOUS RIGHT lower extremity: Normal compressibility of the common femoral, superficial femoral, and popliteal veins, as well as the visualized calf veins. Visualized portions of profunda femoral vein and great saphenous vein unremarkable. No filling defects to suggest DVT on grayscale or color Doppler imaging. Doppler waveforms show normal direction of venous flow, normal respiratory plasticity and response to augmentation. LEFT lower extremity: Small amount of nonocclusive thrombus identified within LEFT common femoral vein, with impaired compressibility. This extends to involve the saphenofemoral junction. Remaining deep venous system in the LEFT lower extremity is patent and compressible. Remaining veins demonstrate spontaneous venous flow. Greater saphenous vein patent. OTHER N/A Limitations: None IMPRESSION: Nonocclusive thrombus within LEFT common femoral vein. Remaining deep venous system of the LEFT lower extremity is normal. No evidence of deep venous thrombosis in the RIGHT lower extremity. Electronically Signed   By: Ulyses Southward M.D.   On: 02/16/2020 14:48   DG Chest Port 1 View  Result Date: 02/16/2020 CLINICAL DATA:  Presented to hospital with abdominal pain, abnormal CT and ultrasound exams with findings suggesting acute cholecystitis. Suspected sepsis EXAM: PORTABLE CHEST 1 VIEW COMPARISON:  Portable exam 0945 hours compared to 02/07/2020 FINDINGS: Enlargement of cardiac silhouette. Mediastinal contours and pulmonary vascularity normal. Lungs clear. No infiltrate, pleural effusion or pneumothorax. Levoconvex upper thoracic scoliosis. IMPRESSION: Enlargement of cardiac silhouette without acute infiltrate. Electronically Signed   By: Ulyses Southward M.D.   On: 02/16/2020 10:00   ECHOCARDIOGRAM COMPLETE  Result Date: 02/17/2020    ECHOCARDIOGRAM REPORT   Patient Name:   Ryan Gibson Date of Exam: 02/16/2020 Medical Rec #:   [id number]           Height:       73.0 in Accession #:    [id number]          Weight:       245.0 lb Date of Birth:  03-31-1975          BSA:          2.345 m Patient Age:    44 years            BP:           112/96 mmHg Patient Gender: M  findings of pulmonary infarct. No sizable parenchymal nodule is seen. No other focal abnormality is noted. Upper Abdomen: Visualized upper abdomen demonstrates minimal free fluid. This is stable from a prior CT from 02/11/2020 Musculoskeletal: No chest wall abnormality. No acute or significant osseous findings. Review of the MIP images confirms the above findings. IMPRESSION: Bilateral pulmonary emboli worst in the right lower lobe. No findings to suggest right heart strain are noted. Bilateral patchy infiltrates likely related to the underlying emboli and may represent early sequelae of pulmonary infarct. Critical Value/emergent results were called by telephone at the time of interpretation on 02/16/2020 at 10:49 am to Dr. Willy Eddy , who verbally acknowledged these results. Electronically Signed   By: Alcide Clever M.D.   On: 02/16/2020 10:53   US Venous Img Lower Bilateral (DVT)  Result Date: 02/16/2020 CLINICAL DATA:  BILATERAL leg pain, pulmonary emboli on CT angio chest EXAM: BILATERAL LOWER EXTREMITY VENOUS DOPPLER ULTRASOUND TECHNIQUE: Gray-scale sonography with compression, as well as color and duplex ultrasound, were performed to evaluate the deep venous system(s) from the level of the common femoral vein through  the popliteal and proximal calf veins. COMPARISON:  None FINDINGS: VENOUS RIGHT lower extremity: Normal compressibility of the common femoral, superficial femoral, and popliteal veins, as well as the visualized calf veins. Visualized portions of profunda femoral vein and great saphenous vein unremarkable. No filling defects to suggest DVT on grayscale or color Doppler imaging. Doppler waveforms show normal direction of venous flow, normal respiratory plasticity and response to augmentation. LEFT lower extremity: Small amount of nonocclusive thrombus identified within LEFT common femoral vein, with impaired compressibility. This extends to involve the saphenofemoral junction. Remaining deep venous system in the LEFT lower extremity is patent and compressible. Remaining veins demonstrate spontaneous venous flow. Greater saphenous vein patent. OTHER N/A Limitations: None IMPRESSION: Nonocclusive thrombus within LEFT common femoral vein. Remaining deep venous system of the LEFT lower extremity is normal. No evidence of deep venous thrombosis in the RIGHT lower extremity. Electronically Signed   By: Ulyses Southward M.D.   On: 02/16/2020 14:48   DG Chest Port 1 View  Result Date: 02/16/2020 CLINICAL DATA:  Presented to hospital with abdominal pain, abnormal CT and ultrasound exams with findings suggesting acute cholecystitis. Suspected sepsis EXAM: PORTABLE CHEST 1 VIEW COMPARISON:  Portable exam 0945 hours compared to 02/07/2020 FINDINGS: Enlargement of cardiac silhouette. Mediastinal contours and pulmonary vascularity normal. Lungs clear. No infiltrate, pleural effusion or pneumothorax. Levoconvex upper thoracic scoliosis. IMPRESSION: Enlargement of cardiac silhouette without acute infiltrate. Electronically Signed   By: Ulyses Southward M.D.   On: 02/16/2020 10:00   ECHOCARDIOGRAM COMPLETE  Result Date: 02/17/2020    ECHOCARDIOGRAM REPORT   Patient Name:   Ryan Gibson Date of Exam: 02/16/2020 Medical Rec #:   [id number]           Height:       73.0 in Accession #:    [id number]          Weight:       245.0 lb Date of Birth:  03-31-1975          BSA:          2.345 m Patient Age:    44 years            BP:           112/96 mmHg Patient Gender: M  PROGRESS NOTE    Ryan Gibson  NWG:[id number] DOB: 1975/12/19 DOA: 02/16/2020 PCP: Wilson Singer, MD   Chief complaint.  Abdominal pain.  Dyspnea. Brief Narrative:  Nikolas Casher is a 44 y.o. male with medical history significant for pain and swelling in his right leg for the last 1 month. He presents to the ER for evaluation 2 days of nausea and pain in the right upper quadrant and epigastrium as well as dry cough. Patient has a known history of gall bladder disease and was placed on oral antibiotic therapy and advised to follow up with surgery. He presents for evaluation of shortness of breath initially with exertion/activity but now short of breath at rest. He is unable to lay flat in bed. Shortness of breath is associated with pleuritic chest discomfort and bilateral leg swelling.  CT angiogram of the chest showed bilateral pulmonary emboli, he also has acalculus cholecystitis. Patient was placed on heparin drip, he is also seen by general surgeon, scheduled for cholecystostomy drain.    Assessment & Plan:   Principal Problem:   Pulmonary embolism (HCC) Active Problems:   Essential hypertension   Depression, major, single episode, moderate (HCC)   Acute acalculous cholecystitis  #1.  Bilateral pulmonary emboli. Patient presents with short of breath, but no hypoxemia.  Currently on heparin drip pending procedure for cholecystitis. I will change to oral anticoagulation if surgery is no longer needed.  2.  Acute acalculous cholecystitis. Patient had evaluated by general surgery, IR has decided to hold off cholecystostomy. Continue pain management.    DVT prophylaxis: Heparin drip Code Status: Full Family Communication: None Disposition Plan:  .   Status is: Inpatient  Remains inpatient appropriate because:Inpatient level of care appropriate due to severity of illness   Dispo: The patient is from: Home              Anticipated d/c is to: Home               Anticipated d/c date is: 3 days              Patient currently is not medically stable to d/c.        I/O last 3 completed shifts: In: 1100 [I.V.:1000; IV Piggyback:100] Out: -  Total I/O In: 50 [IV Piggyback:50] Out: -      Consultants:   General surgery  Procedures: None yet  Antimicrobials: None  Subjective: Patient still complaining intermittent abdominal pain, no nausea vomiting.  No diarrhea constipation pain Denies any chest pain or cough.  Short of breath much improved today. No fever or chills. No dysuria hematuria.   Objective: Vitals:   02/17/20 0500 02/17/20 0530 02/17/20 0600 02/17/20 0630  BP: (!) 115/97 (!) 111/95 (!) 116/99 (!) 116/94  Pulse: 94 92 92 99  Resp: Temp:      TempSrc:      SpO2: 94% 92% (!) 89% 96%  Weight:      Height:        Intake/Output Summary (Last 24 hours) at 02/17/2020 1019 Last data filed at 02/17/2020 0951 Gross per 24 hour  Intake 1150 ml  Output --  Net 1150 ml   Filed Weights   02/16/20 0930 02/16/20 0941  Weight: 113.5 kg 111.1 kg    Examination:  General exam: Appears calm and comfortable  Respiratory system: Clear to auscultation. Respiratory effort normal. Cardiovascular system: S1 & S2 heard, RRR. No JVD, murmurs, rubs, gallops or clicks.  findings of pulmonary infarct. No sizable parenchymal nodule is seen. No other focal abnormality is noted. Upper Abdomen: Visualized upper abdomen demonstrates minimal free fluid. This is stable from a prior CT from 02/11/2020 Musculoskeletal: No chest wall abnormality. No acute or significant osseous findings. Review of the MIP images confirms the above findings. IMPRESSION: Bilateral pulmonary emboli worst in the right lower lobe. No findings to suggest right heart strain are noted. Bilateral patchy infiltrates likely related to the underlying emboli and may represent early sequelae of pulmonary infarct. Critical Value/emergent results were called by telephone at the time of interpretation on 02/16/2020 at 10:49 am to Dr. Willy Eddy , who verbally acknowledged these results. Electronically Signed   By: Alcide Clever M.D.   On: 02/16/2020 10:53   US Venous Img Lower Bilateral (DVT)  Result Date: 02/16/2020 CLINICAL DATA:  BILATERAL leg pain, pulmonary emboli on CT angio chest EXAM: BILATERAL LOWER EXTREMITY VENOUS DOPPLER ULTRASOUND TECHNIQUE: Gray-scale sonography with compression, as well as color and duplex ultrasound, were performed to evaluate the deep venous system(s) from the level of the common femoral vein through  the popliteal and proximal calf veins. COMPARISON:  None FINDINGS: VENOUS RIGHT lower extremity: Normal compressibility of the common femoral, superficial femoral, and popliteal veins, as well as the visualized calf veins. Visualized portions of profunda femoral vein and great saphenous vein unremarkable. No filling defects to suggest DVT on grayscale or color Doppler imaging. Doppler waveforms show normal direction of venous flow, normal respiratory plasticity and response to augmentation. LEFT lower extremity: Small amount of nonocclusive thrombus identified within LEFT common femoral vein, with impaired compressibility. This extends to involve the saphenofemoral junction. Remaining deep venous system in the LEFT lower extremity is patent and compressible. Remaining veins demonstrate spontaneous venous flow. Greater saphenous vein patent. OTHER N/A Limitations: None IMPRESSION: Nonocclusive thrombus within LEFT common femoral vein. Remaining deep venous system of the LEFT lower extremity is normal. No evidence of deep venous thrombosis in the RIGHT lower extremity. Electronically Signed   By: Ulyses Southward M.D.   On: 02/16/2020 14:48   DG Chest Port 1 View  Result Date: 02/16/2020 CLINICAL DATA:  Presented to hospital with abdominal pain, abnormal CT and ultrasound exams with findings suggesting acute cholecystitis. Suspected sepsis EXAM: PORTABLE CHEST 1 VIEW COMPARISON:  Portable exam 0945 hours compared to 02/07/2020 FINDINGS: Enlargement of cardiac silhouette. Mediastinal contours and pulmonary vascularity normal. Lungs clear. No infiltrate, pleural effusion or pneumothorax. Levoconvex upper thoracic scoliosis. IMPRESSION: Enlargement of cardiac silhouette without acute infiltrate. Electronically Signed   By: Ulyses Southward M.D.   On: 02/16/2020 10:00   ECHOCARDIOGRAM COMPLETE  Result Date: 02/17/2020    ECHOCARDIOGRAM REPORT   Patient Name:   Ryan Gibson Date of Exam: 02/16/2020 Medical Rec #:   [id number]           Height:       73.0 in Accession #:    [id number]          Weight:       245.0 lb Date of Birth:  03-31-1975          BSA:          2.345 m Patient Age:    44 years            BP:           112/96 mmHg Patient Gender: M  PROGRESS NOTE    Ryan Gibson  NWG:[id number] DOB: 1975/12/19 DOA: 02/16/2020 PCP: Wilson Singer, MD   Chief complaint.  Abdominal pain.  Dyspnea. Brief Narrative:  Nikolas Casher is a 44 y.o. male with medical history significant for pain and swelling in his right leg for the last 1 month. He presents to the ER for evaluation 2 days of nausea and pain in the right upper quadrant and epigastrium as well as dry cough. Patient has a known history of gall bladder disease and was placed on oral antibiotic therapy and advised to follow up with surgery. He presents for evaluation of shortness of breath initially with exertion/activity but now short of breath at rest. He is unable to lay flat in bed. Shortness of breath is associated with pleuritic chest discomfort and bilateral leg swelling.  CT angiogram of the chest showed bilateral pulmonary emboli, he also has acalculus cholecystitis. Patient was placed on heparin drip, he is also seen by general surgeon, scheduled for cholecystostomy drain.    Assessment & Plan:   Principal Problem:   Pulmonary embolism (HCC) Active Problems:   Essential hypertension   Depression, major, single episode, moderate (HCC)   Acute acalculous cholecystitis  #1.  Bilateral pulmonary emboli. Patient presents with short of breath, but no hypoxemia.  Currently on heparin drip pending procedure for cholecystitis. I will change to oral anticoagulation if surgery is no longer needed.  2.  Acute acalculous cholecystitis. Patient had evaluated by general surgery, IR has decided to hold off cholecystostomy. Continue pain management.    DVT prophylaxis: Heparin drip Code Status: Full Family Communication: None Disposition Plan:  .   Status is: Inpatient  Remains inpatient appropriate because:Inpatient level of care appropriate due to severity of illness   Dispo: The patient is from: Home              Anticipated d/c is to: Home               Anticipated d/c date is: 3 days              Patient currently is not medically stable to d/c.        I/O last 3 completed shifts: In: 1100 [I.V.:1000; IV Piggyback:100] Out: -  Total I/O In: 50 [IV Piggyback:50] Out: -      Consultants:   General surgery  Procedures: None yet  Antimicrobials: None  Subjective: Patient still complaining intermittent abdominal pain, no nausea vomiting.  No diarrhea constipation pain Denies any chest pain or cough.  Short of breath much improved today. No fever or chills. No dysuria hematuria.   Objective: Vitals:   02/17/20 0500 02/17/20 0530 02/17/20 0600 02/17/20 0630  BP: (!) 115/97 (!) 111/95 (!) 116/99 (!) 116/94  Pulse: 94 92 92 99  Resp: Temp:      TempSrc:      SpO2: 94% 92% (!) 89% 96%  Weight:      Height:        Intake/Output Summary (Last 24 hours) at 02/17/2020 1019 Last data filed at 02/17/2020 0951 Gross per 24 hour  Intake 1150 ml  Output --  Net 1150 ml   Filed Weights   02/16/20 0930 02/16/20 0941  Weight: 113.5 kg 111.1 kg    Examination:  General exam: Appears calm and comfortable  Respiratory system: Clear to auscultation. Respiratory effort normal. Cardiovascular system: S1 & S2 heard, RRR. No JVD, murmurs, rubs, gallops or clicks.  findings of pulmonary infarct. No sizable parenchymal nodule is seen. No other focal abnormality is noted. Upper Abdomen: Visualized upper abdomen demonstrates minimal free fluid. This is stable from a prior CT from 02/11/2020 Musculoskeletal: No chest wall abnormality. No acute or significant osseous findings. Review of the MIP images confirms the above findings. IMPRESSION: Bilateral pulmonary emboli worst in the right lower lobe. No findings to suggest right heart strain are noted. Bilateral patchy infiltrates likely related to the underlying emboli and may represent early sequelae of pulmonary infarct. Critical Value/emergent results were called by telephone at the time of interpretation on 02/16/2020 at 10:49 am to Dr. Willy Eddy , who verbally acknowledged these results. Electronically Signed   By: Alcide Clever M.D.   On: 02/16/2020 10:53   US Venous Img Lower Bilateral (DVT)  Result Date: 02/16/2020 CLINICAL DATA:  BILATERAL leg pain, pulmonary emboli on CT angio chest EXAM: BILATERAL LOWER EXTREMITY VENOUS DOPPLER ULTRASOUND TECHNIQUE: Gray-scale sonography with compression, as well as color and duplex ultrasound, were performed to evaluate the deep venous system(s) from the level of the common femoral vein through  the popliteal and proximal calf veins. COMPARISON:  None FINDINGS: VENOUS RIGHT lower extremity: Normal compressibility of the common femoral, superficial femoral, and popliteal veins, as well as the visualized calf veins. Visualized portions of profunda femoral vein and great saphenous vein unremarkable. No filling defects to suggest DVT on grayscale or color Doppler imaging. Doppler waveforms show normal direction of venous flow, normal respiratory plasticity and response to augmentation. LEFT lower extremity: Small amount of nonocclusive thrombus identified within LEFT common femoral vein, with impaired compressibility. This extends to involve the saphenofemoral junction. Remaining deep venous system in the LEFT lower extremity is patent and compressible. Remaining veins demonstrate spontaneous venous flow. Greater saphenous vein patent. OTHER N/A Limitations: None IMPRESSION: Nonocclusive thrombus within LEFT common femoral vein. Remaining deep venous system of the LEFT lower extremity is normal. No evidence of deep venous thrombosis in the RIGHT lower extremity. Electronically Signed   By: Ulyses Southward M.D.   On: 02/16/2020 14:48   DG Chest Port 1 View  Result Date: 02/16/2020 CLINICAL DATA:  Presented to hospital with abdominal pain, abnormal CT and ultrasound exams with findings suggesting acute cholecystitis. Suspected sepsis EXAM: PORTABLE CHEST 1 VIEW COMPARISON:  Portable exam 0945 hours compared to 02/07/2020 FINDINGS: Enlargement of cardiac silhouette. Mediastinal contours and pulmonary vascularity normal. Lungs clear. No infiltrate, pleural effusion or pneumothorax. Levoconvex upper thoracic scoliosis. IMPRESSION: Enlargement of cardiac silhouette without acute infiltrate. Electronically Signed   By: Ulyses Southward M.D.   On: 02/16/2020 10:00   ECHOCARDIOGRAM COMPLETE  Result Date: 02/17/2020    ECHOCARDIOGRAM REPORT   Patient Name:   Ryan Gibson Date of Exam: 02/16/2020 Medical Rec #:   [id number]           Height:       73.0 in Accession #:    [id number]          Weight:       245.0 lb Date of Birth:  03-31-1975          BSA:          2.345 m Patient Age:    44 years            BP:           112/96 mmHg Patient Gender: M

## 2020-02-17 NOTE — Progress Notes (Signed)
SURGICAL PROGRESS NOTE   Hospital Day(s): 1.   Post op day(s):  Marland Kitchen   Interval History: Patient seen and examined, no acute events or new complaints overnight. Patient reports continued having pain in the right upper quadrant.  He denies any improvement.  He denies nausea or vomiting.  He denies any fever or chills.  The pain does not radiate to the probably body.  There has been no alleviating factors.  Aggravating factor is applying pressure on the right upper quadrant.  Vital signs in last 24 hours: [min-max] current  Temp:  [98.7 F (37.1 C)] 98.7 F (37.1 C) (12/27 1615) Pulse Rate:  [92-106] 105 (12/27 1615) Resp:  [12-33] 18 (12/27 1615) BP: (110-138)/(72-104) 129/79 (12/27 1615) SpO2:  [89 %-100 %] 99 % (12/27 1615)     Height: 6\' 1"  (185.4 cm) Weight: 111.1 kg BMI (Calculated): 32.33   Physical Exam:  Constitutional: alert, cooperative and no distress  Respiratory: breathing non-labored at rest  Cardiovascular: regular rate and sinus rhythm  Gastrointestinal: soft, tender to palpation in right upper quadrant, and non-distended  Labs:  CBC Latest Ref Rng & Units 02/17/2020 02/16/2020 02/10/2020  WBC 4.0 - 10.5 K/uL 9.9 11.0(H) 12.6(H)  Hemoglobin 13.0 - 17.0 g/dL 02/12/2020 46.2 70.3  Hematocrit 39.0 - 52.0 % 45.2 47.6 45.3  Platelets 150 - 400 K/uL 229 273 396   CMP Latest Ref Rng & Units 02/17/2020 02/16/2020 02/10/2020  Glucose 70 - 99 mg/dL 99 02/12/2020) 938(H)  BUN 6 - 20 mg/dL 12 11 20   Creatinine 0.61 - 1.24 mg/dL 829(H 3.71  Sodium 135 - 145 mmol/L 133(L) 133(L) 139  Potassium 3.5 - 5.1 mmol/L 4.5 4.1 4.6  Chloride 98 - 111 mmol/L 103 98 103  CO2 22 - 32 mmol/L 23 24 25   Calcium 8.9 - 10.3 mg/dL 8.2(L) 8.7(L) 9.1  Total Protein 6.5 - 8.1 g/dL - 6.5 6.2  Total Bilirubin 0.3 - 1.2 mg/dL - 1.7(H) 0.8  Alkaline Phos 38 - 126 U/L - 62 -  AST 15 - 41 U/L - 36 40  ALT 0 - 44 U/L - 77(H) 70(H)    Imaging studies: No new pertinent imaging studies   Assessment/Plan:   44 y.o. male with acute versus subacute cholecystitis, complicated by pertinent comorbidities including acute pulmonary embolism.  Patient today without improvement of the abdominal pain.  I reviewed the evaluation by IR.  There is concern of his current PE and holding anticoagulation.  Current plan is to try with antibiotic therapy.  If right upper quadrant pain does not improve I will discuss with IR and hospitalist about the risk versus benefit of percutaneous cholecystostomy.  Today no fever, improved white blood cell count. Will continue to follow closely.     7.89, MD

## 2020-02-17 NOTE — Consult Note (Signed)
ANTICOAGULATION CONSULT NOTE   Pharmacy Consult for Heparin  Indication: pulmonary embolus  Patient Measurements: Heparin Dosing Weight: 104 kg   Labs: Recent Labs    02/16/20 0937 02/16/20 0939 02/16/20 1117 02/16/20 1835 02/16/20 2045 02/17/20 0400 02/17/20 1100  HGB  --  15.5  --   --   --  14.6  --   HCT  --  47.6  --   --   --  45.2  --   PLT  --  273  --   --   --  229  --   APTT  --   --  31  --   --   --   --   LABPROT 17.0*  --   --   --   --   --   --   INR 1.4*  --   --   --   --   --   --   HEPARINUNFRC  --   --   --    < > 0.15* 0.21* <0.10*  CREATININE  --  1.00  --   --   --  0.92  --   TROPONINIHS  --  50*  --   --   --   --   --    < > = values in this interval not displayed.    Estimated Creatinine Clearance: 133.9 mL/min (by C-G formula based on SCr of 0.92 mg/dL).   Medical History: Past Medical History:  Diagnosis Date  . Asthma    AS A CHILD ONLY  . Foot pain, right    painful x 1 month and swelling    Medications:  Confirmed with patient no anticoagulants at this time.    Assessment: Patient is a 44 y/o M with medical history as above who presented to the ED 12/26 with shortness of breath and bilateral leg swelling. Subsequently found to have bilateral pulmonary emboli as well as non-occlusive thrombus in left common femoral vein. Pharmacy consulted to initiate heparin infusion for PE.  Baseline aPTT 31 seconds, INR 1.4. Baseline CBC within normal limits.  Heparin course: 12/26 at 1121: IV heparin 5000 unit bolus x 1 followed by continuous infusion at 1650 units/hr 12/26 at 1835 HL > 3.6, supratherapeutic. RN held drip at 2005 12/26 at 2045 HL 0.15 (re-check with drip off).  12/27 at 0400 HL 0.21, SUBtherapeutic,  3000 unit IV x 1 and increased  infusion to 1600 units/h 1227 1100: HL < 0.10, subtherapeutic   Goal of Therapy:  Heparin level 0.3-0.7 units/ml Monitor platelets by anticoagulation protocol: Yes   Plan:  --HL  subtherapetic at < 0.10  --Will re-bolus 3000 unit IV x 1 and increase heparin infusion to 1900 units/hr --Re-check heparin level 6 hours  --Daily CBC per protocol while on heparin drip --Follow-up plan to transition to oral anticoagulant   Elisheva Fallas E Ebbie Sorenson,PharmD, BCPS 02/17/2020,1:18 PM

## 2020-02-17 NOTE — Consult Note (Signed)
ANTICOAGULATION CONSULT NOTE   Pharmacy Consult for Heparin  Indication: pulmonary embolus  Patient Measurements: Heparin Dosing Weight: 104 kg   Labs: Recent Labs    02/16/20 0937 02/16/20 0939 02/16/20 1117 02/16/20 1835 02/17/20 0400 02/17/20 1100 02/17/20 1930  HGB  --  15.5  --   --  14.6  --   --   HCT  --  47.6  --   --  45.2  --   --   PLT  --  273  --   --  229  --   --   APTT  --   --  31  --   --   --   --   LABPROT 17.0*  --   --   --   --   --   --   INR 1.4*  --   --   --   --   --   --   HEPARINUNFRC  --   --   --    < > 0.21* <0.10* 0.16*  CREATININE  --  1.00  --   --  0.92  --   --   TROPONINIHS  --  50*  --   --   --   --   --    < > = values in this interval not displayed.    Estimated Creatinine Clearance: 133.9 mL/min (by C-G formula based on SCr of 0.92 mg/dL).   Medical History: Past Medical History:  Diagnosis Date  . Asthma    AS A CHILD ONLY  . Foot pain, right    painful x 1 month and swelling    Medications:  Confirmed with patient no anticoagulants at this time.    Assessment: Patient is a 44 y/o M with medical history as above who presented to the ED 12/26 with shortness of breath and bilateral leg swelling. Subsequently found to have bilateral pulmonary emboli as well as non-occlusive thrombus in left common femoral vein. Pharmacy consulted to initiate heparin infusion for PE.  Baseline aPTT 31 seconds, INR 1.4. Baseline CBC within normal limits.  Heparin course: 12/26 at 1121: IV heparin 5000 unit bolus x 1 followed by continuous infusion at 1650 units/hr 12/26 at 1835 HL > 3.6, supratherapeutic. RN held drip at 2005 12/26 at 2045 HL 0.15 (re-check with drip off).  12/27 at 0400 HL 0.21, SUBtherapeutic,  3000 unit IV x 1 and increased  infusion to 1600 units/h 1227 1100: HL < 0.10, subtherapeutic  1227 1930: HL = 0.16 subtherapeutic   Goal of Therapy:  Heparin level 0.3-0.7 units/ml Monitor platelets by anticoagulation  protocol: Yes   Plan:   12/27:  HL @ 1930 = 0.16 Will order Heparin 3000 units IV X 1 and increase drip rate to 2300 units/hr. Will recheck HL 6 hrs after rate change.  ----Daily CBC per protocol while on heparin drip --Follow-up plan to transition to oral anticoagulant   Gerrod Maule,Lexx D,PharmD 02/17/2020,9:17 PM

## 2020-02-17 NOTE — Progress Notes (Signed)
44 y.o.male inpatient. History of childhood asthma. Seen at PCP on 12.20.21 with right sided abdominal pain X 2 months. CT abdomen pelvis from 12.21.21 reads Nonspecific gallbladder wall thickening versus pericholecystic fluid. US abdomen from 12.22.21 reads Gallbladder wall edema with pericholecystic free fluid and positive sonographic Murphy's sign. Pateint was  given antibiotics and directed to see general surgery but was unable to obtain an appointment. On 12.26.21 the Patient presented to the ED at [facility] with a sudden onset of SHOB found to be a PE. Confirmed on CT Chest from 12.26.21. Due to the high risk of complication in the setting of a PE General Surgery has requesting a cholecystomy tube placement.   WBC is 9.9 (downtrending). Lactic acid 1.4 (was 2.6),  Patient is  on a heparin gtt. Allergies include vancomycin.   IR consulted for possible cholecystomy tube placement.  After review of the CT abdomen pelvis from 12.21.21 and the US Abdomen from 12.22.21 the gallbladder shows gallbladder wall thickening with no distention. This coupled with the now normal WBC is suggestive of chronic gallbladder issue rather than an acute finding. In light of this being a long standing issue recommend continue treatment for the patient's newly diagnosed large right sided PE and suggest the Team not hold heparin at this time. Request for cholecystostomy tube placement can be reassessed when patient condition has improved.

## 2020-02-17 NOTE — Consult Note (Signed)
ANTICOAGULATION CONSULT NOTE   Pharmacy Consult for Heparin  Indication: pulmonary embolus  Patient Measurements: Heparin Dosing Weight: 104 kg   Labs: Recent Labs    02/16/20 0937 02/16/20 0939 02/16/20 1117 02/16/20 1835 02/16/20 2045 02/17/20 0400  HGB  --  15.5  --   --   --  14.6  HCT  --  47.6  --   --   --  45.2  PLT  --  273  --   --   --  229  APTT  --   --  31  --   --   --   LABPROT 17.0*  --   --   --   --   --   INR 1.4*  --   --   --   --   --   HEPARINUNFRC  --   --   --  >3.60* 0.15* 0.21*  CREATININE  --  1.00  --   --   --  0.92  TROPONINIHS  --  50*  --   --   --   --     Estimated Creatinine Clearance: 133.9 mL/min (by C-G formula based on SCr of 0.92 mg/dL).   Medical History: Past Medical History:  Diagnosis Date   Asthma    AS A CHILD ONLY   Foot pain, right    painful x 1 month and swelling    Medications:  Confirmed with patient no anticoagulants at this time.    Assessment: Patient is a 44 y/o M with medical history as above who presented to the ED 12/26 with shortness of breath and bilateral leg swelling. Subsequently found to have bilateral pulmonary emboli as well as non-occlusive thrombus in left common femoral vein. Pharmacy consulted to initiate heparin infusion for PE.  Baseline aPTT 31 seconds, INR 1.4. Baseline CBC within normal limits.  Heparin course: 12/26 at 1121: IV heparin 5000 unit bolus x 1 followed by continuous infusion at 1650 units/hr 12/26 at 1835 HL > 3.6, supratherapeutic. RN held drip at 2005 12/26 at 2045 HL 0.15 (re-check with drip off).  12/27 at 0400 HL 0.21, SUBtherapeutic, CBC stable  Goal of Therapy:  Heparin level 0.3-0.7 units/ml Monitor platelets by anticoagulation protocol: Yes   Plan:  --Will re-bolus 3000 unit IV x 1 and increase heparin infusion to 1600 units/hr --Re-check heparin level 6 hours  --Daily CBC per protocol while on heparin drip  Margo Aye, Keric Zehren A 02/17/2020,4:27 AM

## 2020-02-18 DIAGNOSIS — I10 Essential (primary) hypertension: Secondary | ICD-10-CM

## 2020-02-18 DIAGNOSIS — K810 Acute cholecystitis: Secondary | ICD-10-CM | POA: Diagnosis not present

## 2020-02-18 DIAGNOSIS — I2699 Other pulmonary embolism without acute cor pulmonale: Secondary | ICD-10-CM | POA: Diagnosis not present

## 2020-02-18 LAB — TROPONIN I (HIGH SENSITIVITY)
Troponin I (High Sensitivity): 38 ng/L — ABNORMAL HIGH (ref <18)
Troponin I (High Sensitivity): 46 ng/L — ABNORMAL HIGH (ref <18)

## 2020-02-18 LAB — HEPARIN LEVEL (UNFRACTIONATED)
Heparin Unfractionated: 0.25 IU/mL — ABNORMAL LOW (ref 0.30–0.70)
Heparin Unfractionated: 0.19 IU/mL — ABNORMAL LOW (ref 0.30–0.70)
Heparin Unfractionated: 0.14 IU/mL — ABNORMAL LOW (ref 0.30–0.70)

## 2020-02-18 MED ORDER — HEPARIN BOLUS VIA INFUSION
1600.0000 [IU] | Freq: Once | INTRAVENOUS | Status: AC
  Administered 2020-02-18: 1600 [IU] via INTRAVENOUS
  Filled 2020-02-18: qty 1600

## 2020-02-18 MED ORDER — FUROSEMIDE 10 MG/ML IJ SOLN
40.0000 mg | Freq: Two times a day (BID) | INTRAMUSCULAR | Status: DC
  Administered 2020-02-18 – 2020-02-22 (×7): 40 mg via INTRAVENOUS
  Filled 2020-02-18 (×7): qty 4

## 2020-02-18 MED ORDER — HEPARIN BOLUS VIA INFUSION
1500.0000 [IU] | Freq: Once | INTRAVENOUS | Status: AC
  Administered 2020-02-18: 1500 [IU] via INTRAVENOUS
  Filled 2020-02-18: qty 1500

## 2020-02-18 MED ORDER — HEPARIN (PORCINE) 25000 UT/250ML-% IV SOLN
2700.0000 [IU]/h | INTRAVENOUS | Status: DC
  Administered 2020-02-18: 2700 [IU]/h via INTRAVENOUS
  Administered 2020-02-18 (×2): 2500 [IU]/h via INTRAVENOUS
  Filled 2020-02-18: qty 250

## 2020-02-18 NOTE — Progress Notes (Signed)
SURGICAL PROGRESS NOTE   Hospital Day(s): 2.   Post op day(s):  Marland Kitchen   Interval History: Patient seen and examined, no acute events or new complaints overnight. Patient reports continued having significant pain in the right upper quadrant.  Patient has intermittent nausea.  Patient denies any vomiting at this moment but still nauseous.  He reported the pain has not improved.  The pain radiates to her right back.  Vital signs in last 24 hours: [min-max] current  Temp:  [97.3 F (36.3 C)-98.7 F (37.1 C)] 97.9 F (36.6 C) (12/28 1152) Pulse Rate:  [100-117] 115 (12/28 1152) Resp:  [12-20] 19 (12/28 1152) BP: (102-133)/(78-115) 107/90 (12/28 1152) SpO2:  [94 %-100 %] 99 % (12/28 1152) Weight:  [121.8 kg] 121.8 kg (12/28 0553)     Height: 6\' 2"  (188 cm) Weight: 121.8 kg BMI (Calculated): 34.46   Physical Exam:  Constitutional: alert, cooperative and no distress  Respiratory: breathing non-labored at rest  Cardiovascular: regular rate and sinus rhythm  Gastrointestinal: soft, tender to palpation in the right upper quadrant, and non-distended  Labs:  CBC Latest Ref Rng & Units 02/17/2020 02/16/2020 02/10/2020  WBC 4.0 - 10.5 K/uL 9.9 11.0(H) 12.6(H)  Hemoglobin 13.0 - 17.0 g/dL 02/12/2020 67.1 24.5  Hematocrit 39.0 - 52.0 % 45.2 47.6 45.3  Platelets 150 - 400 K/uL 229 273 396   CMP Latest Ref Rng & Units 02/17/2020 02/16/2020 02/10/2020  Glucose 70 - 99 mg/dL 99 02/12/2020) 983(J)  BUN 6 - 20 mg/dL 12 11 20   Creatinine 0.61 - 1.24 mg/dL 825(K 5.39  Sodium 135 - 145 mmol/L 133(L) 133(L) 139  Potassium 3.5 - 5.1 mmol/L 4.5 4.1 4.6  Chloride 98 - 111 mmol/L 103 98 103  CO2 22 - 32 mmol/L 23 24 25   Calcium 8.9 - 10.3 mg/dL 8.2(L) 8.7(L) 9.1  Total Protein 6.5 - 8.1 g/dL - 6.5 6.2  Total Bilirubin 0.3 - 1.2 mg/dL - 1.7(H) 0.8  Alkaline Phos 38 - 126 U/L - 62 -  AST 15 - 41 U/L - 36 40  ALT 0 - 44 U/L - 77(H) 70(H)    Imaging studies: No new pertinent imaging  studies   Assessment/Plan:  44 y.o.malewith acute versus subacute cholecystitis, complicated by pertinent comorbidities includingacute pulmonary embolism.  Patient without significant improvement of the right upper quadrant pain.  There is no clinical deterioration either.  Patient without respiratory distress or shortness of breath.  Patient afebrile.  White blood cells 9.9.  I discussed the case with IR today.  The plan will be to get HIDA scan and repeat ultrasound for further evaluation of the gallbladder and possible cholecystostomy tube.  I will put patient n.p.o. after midnight for HIDA scan and possible cholecystostomy tube placement.  I will coordinate with primary team and nurse for holding heparin before procedure.  I discussed the plan with the patient and his wife.  7.67, MD

## 2020-02-18 NOTE — Progress Notes (Addendum)
Date: 02/16/2020 CLINICAL DATA:  BILATERAL leg pain, pulmonary emboli on CT angio chest EXAM: BILATERAL LOWER EXTREMITY VENOUS DOPPLER ULTRASOUND TECHNIQUE: Gray-scale sonography with compression, as well as color and duplex ultrasound, were performed to evaluate the deep venous system(s) from the level of the common femoral vein through the popliteal and proximal calf veins. COMPARISON:  None FINDINGS: VENOUS RIGHT lower extremity: Normal compressibility of the common femoral, superficial femoral, and popliteal veins, as well as the visualized calf veins. Visualized portions of profunda femoral vein and great saphenous vein unremarkable. No filling defects to suggest DVT on grayscale or color Doppler imaging. Doppler waveforms show normal direction of venous flow, normal respiratory plasticity and response to augmentation. LEFT lower extremity: Small amount of nonocclusive thrombus identified within LEFT common femoral vein, with impaired compressibility. This extends to involve the saphenofemoral junction. Remaining deep venous system in the LEFT lower extremity is patent and compressible. Remaining veins demonstrate spontaneous venous flow. Greater saphenous vein patent. OTHER N/A Limitations: None  IMPRESSION: Nonocclusive thrombus within LEFT common femoral vein. Remaining deep venous system of the LEFT lower extremity is normal. No evidence of deep venous thrombosis in the RIGHT lower extremity. Electronically Signed   By: Ulyses Southward M.D.   On: 02/16/2020 14:48   ECHOCARDIOGRAM COMPLETE  Result Date: 02/17/2020    ECHOCARDIOGRAM REPORT   Patient Name:   Ryan Gibson Date of Exam: 02/16/2020 Medical Rec #:  [id number]           Height:       73.0 in Accession #:    [id number]          Weight:       245.0 lb Date of Birth:  03-Apr-1975          BSA:          2.345 m Patient Age:    44 years            BP:           112/96 mmHg Patient Gender: M                   HR:           104 bpm. Exam Location:  [facility] Procedure: 2D Echo, Cardiac Doppler and Color Doppler Indications:     Pulmonary Embolus 415.19 / I26.99  History:         Patient has no prior history of Echocardiogram examinations.  Sonographer:     Neysa Bonito Roar Referring Phys:  [id number] TTSVXBLT AGBATA Diagnosing Phys: Arnoldo Hooker MD IMPRESSIONS  1. Left ventricular ejection fraction, by estimation, is <20%. The left ventricle has severely decreased function. The left ventricle demonstrates global hypokinesis. The left ventricular internal cavity size was severely dilated. There is mild left ventricular hypertrophy. Left ventricular diastolic parameters are consistent with Grade I diastolic dysfunction (impaired relaxation).  2. Right ventricular systolic function is low normal. The right ventricular size is mildly enlarged.  3. The mitral valve is normal in structure. Moderate to severe mitral valve regurgitation.  4. Tricuspid valve regurgitation is moderate.  5. The aortic valve is normal in structure. Aortic valve regurgitation is not visualized. FINDINGS  Left Ventricle: Left ventricular ejection fraction, by estimation, is <20%. The left ventricle has severely decreased function. The left ventricle demonstrates global hypokinesis. The  left ventricular internal cavity size was severely dilated. There is mild left ventricular hypertrophy. Left ventricular diastolic parameters are consistent with Grade I diastolic dysfunction (impaired relaxation). Right  Date: 02/16/2020 CLINICAL DATA:  BILATERAL leg pain, pulmonary emboli on CT angio chest EXAM: BILATERAL LOWER EXTREMITY VENOUS DOPPLER ULTRASOUND TECHNIQUE: Gray-scale sonography with compression, as well as color and duplex ultrasound, were performed to evaluate the deep venous system(s) from the level of the common femoral vein through the popliteal and proximal calf veins. COMPARISON:  None FINDINGS: VENOUS RIGHT lower extremity: Normal compressibility of the common femoral, superficial femoral, and popliteal veins, as well as the visualized calf veins. Visualized portions of profunda femoral vein and great saphenous vein unremarkable. No filling defects to suggest DVT on grayscale or color Doppler imaging. Doppler waveforms show normal direction of venous flow, normal respiratory plasticity and response to augmentation. LEFT lower extremity: Small amount of nonocclusive thrombus identified within LEFT common femoral vein, with impaired compressibility. This extends to involve the saphenofemoral junction. Remaining deep venous system in the LEFT lower extremity is patent and compressible. Remaining veins demonstrate spontaneous venous flow. Greater saphenous vein patent. OTHER N/A Limitations: None  IMPRESSION: Nonocclusive thrombus within LEFT common femoral vein. Remaining deep venous system of the LEFT lower extremity is normal. No evidence of deep venous thrombosis in the RIGHT lower extremity. Electronically Signed   By: Ulyses Southward M.D.   On: 02/16/2020 14:48   ECHOCARDIOGRAM COMPLETE  Result Date: 02/17/2020    ECHOCARDIOGRAM REPORT   Patient Name:   Ryan Gibson Date of Exam: 02/16/2020 Medical Rec #:  [id number]           Height:       73.0 in Accession #:    [id number]          Weight:       245.0 lb Date of Birth:  03-Apr-1975          BSA:          2.345 m Patient Age:    44 years            BP:           112/96 mmHg Patient Gender: M                   HR:           104 bpm. Exam Location:  [facility] Procedure: 2D Echo, Cardiac Doppler and Color Doppler Indications:     Pulmonary Embolus 415.19 / I26.99  History:         Patient has no prior history of Echocardiogram examinations.  Sonographer:     Neysa Bonito Roar Referring Phys:  [id number] TTSVXBLT AGBATA Diagnosing Phys: Arnoldo Hooker MD IMPRESSIONS  1. Left ventricular ejection fraction, by estimation, is <20%. The left ventricle has severely decreased function. The left ventricle demonstrates global hypokinesis. The left ventricular internal cavity size was severely dilated. There is mild left ventricular hypertrophy. Left ventricular diastolic parameters are consistent with Grade I diastolic dysfunction (impaired relaxation).  2. Right ventricular systolic function is low normal. The right ventricular size is mildly enlarged.  3. The mitral valve is normal in structure. Moderate to severe mitral valve regurgitation.  4. Tricuspid valve regurgitation is moderate.  5. The aortic valve is normal in structure. Aortic valve regurgitation is not visualized. FINDINGS  Left Ventricle: Left ventricular ejection fraction, by estimation, is <20%. The left ventricle has severely decreased function. The left ventricle demonstrates global hypokinesis. The  left ventricular internal cavity size was severely dilated. There is mild left ventricular hypertrophy. Left ventricular diastolic parameters are consistent with Grade I diastolic dysfunction (impaired relaxation). Right  Date: 02/16/2020 CLINICAL DATA:  BILATERAL leg pain, pulmonary emboli on CT angio chest EXAM: BILATERAL LOWER EXTREMITY VENOUS DOPPLER ULTRASOUND TECHNIQUE: Gray-scale sonography with compression, as well as color and duplex ultrasound, were performed to evaluate the deep venous system(s) from the level of the common femoral vein through the popliteal and proximal calf veins. COMPARISON:  None FINDINGS: VENOUS RIGHT lower extremity: Normal compressibility of the common femoral, superficial femoral, and popliteal veins, as well as the visualized calf veins. Visualized portions of profunda femoral vein and great saphenous vein unremarkable. No filling defects to suggest DVT on grayscale or color Doppler imaging. Doppler waveforms show normal direction of venous flow, normal respiratory plasticity and response to augmentation. LEFT lower extremity: Small amount of nonocclusive thrombus identified within LEFT common femoral vein, with impaired compressibility. This extends to involve the saphenofemoral junction. Remaining deep venous system in the LEFT lower extremity is patent and compressible. Remaining veins demonstrate spontaneous venous flow. Greater saphenous vein patent. OTHER N/A Limitations: None  IMPRESSION: Nonocclusive thrombus within LEFT common femoral vein. Remaining deep venous system of the LEFT lower extremity is normal. No evidence of deep venous thrombosis in the RIGHT lower extremity. Electronically Signed   By: Ulyses Southward M.D.   On: 02/16/2020 14:48   ECHOCARDIOGRAM COMPLETE  Result Date: 02/17/2020    ECHOCARDIOGRAM REPORT   Patient Name:   Ryan Gibson Date of Exam: 02/16/2020 Medical Rec #:  [id number]           Height:       73.0 in Accession #:    [id number]          Weight:       245.0 lb Date of Birth:  03-Apr-1975          BSA:          2.345 m Patient Age:    44 years            BP:           112/96 mmHg Patient Gender: M                   HR:           104 bpm. Exam Location:  [facility] Procedure: 2D Echo, Cardiac Doppler and Color Doppler Indications:     Pulmonary Embolus 415.19 / I26.99  History:         Patient has no prior history of Echocardiogram examinations.  Sonographer:     Neysa Bonito Roar Referring Phys:  [id number] TTSVXBLT AGBATA Diagnosing Phys: Arnoldo Hooker MD IMPRESSIONS  1. Left ventricular ejection fraction, by estimation, is <20%. The left ventricle has severely decreased function. The left ventricle demonstrates global hypokinesis. The left ventricular internal cavity size was severely dilated. There is mild left ventricular hypertrophy. Left ventricular diastolic parameters are consistent with Grade I diastolic dysfunction (impaired relaxation).  2. Right ventricular systolic function is low normal. The right ventricular size is mildly enlarged.  3. The mitral valve is normal in structure. Moderate to severe mitral valve regurgitation.  4. Tricuspid valve regurgitation is moderate.  5. The aortic valve is normal in structure. Aortic valve regurgitation is not visualized. FINDINGS  Left Ventricle: Left ventricular ejection fraction, by estimation, is <20%. The left ventricle has severely decreased function. The left ventricle demonstrates global hypokinesis. The  left ventricular internal cavity size was severely dilated. There is mild left ventricular hypertrophy. Left ventricular diastolic parameters are consistent with Grade I diastolic dysfunction (impaired relaxation). Right  PROGRESS NOTE    Dedrick Heffner  ZOX:[id number] DOB: [birth year] DOA: 02/16/2020 PCP: Wilson Singer, MD   Complaint. Shortness of breath and abdominal pain. Brief Narrative:  Tranell Wojtkiewicz Fisheris a 44 y.o.malewith medical history significant forpain and swelling in his right leg for the last 1 month. He presents to the ER for evaluation 2 days of nausea andpain in the right upper quadrant and epigastriumas well as dry cough. Patient has a known history of gall bladder disease and was placed on oral antibiotic therapy and advised to follow up with surgery. He presents for evaluation of shortness of breath initially with exertion/activity but now short of breath at rest. He is unable to lay flat in bed.Shortness of breath is associated with pleuritic chest discomfort and bilateral leg swelling.  CT angiogram of the chest showed bilateral pulmonary emboli, he also has acalculus cholecystitis. Patient was placed on heparin drip, he is also seen by general surgeon. Currently covered with Zosyn. Echo showed Ef <20%, patient developed hypoxemia.   Assessment & Plan:   Principal Problem:   Pulmonary embolism (HCC) Active Problems:   Essential hypertension   Depression, major, single episode, moderate (HCC)   Acute acalculous cholecystitis  #1. Bilateral pulmonary emboli. Hypoxemic respiratory failure. CT angiogram showed bilateral extensive pulmonary emboli, patient still has significant tachycardia, developed some hypoxemia today. Currently he is on 3 L oxygen. Currently on heparin drip. We will continue, will change to oral anticoagulation if no surgical procedure is planned.  #2. Acute acalculous cholecystitis. Patient has evaluate by general surgery and interventional radiology. Currently decision was made to hold off cholecystomy drain. Concerned patient will not be able to take off heparin for procedure. We will continue IV antibiotics, monitor closely.  #3. Essential  hypertension. Continue some home medicine  #4 pure essential hypertension. Continue to monitor blood pressure.  #5. Acute hypoxemic respiratory failure. Acute on chronic systolic CHF. Elevated troponin. Echo showed EF <20%. Will consult cardiology. Start IV lasix, dc IVF. Repeat troponin. Initial EKG no st elevation, frequent PVCs.     DVT prophylaxis: Heparin drip. Code Status: Full Family Communication: None Disposition Plan:  .   Status is: Inpatient  Remains inpatient appropriate because:Inpatient level of care appropriate due to severity of illness   Dispo: The patient is from: Home              Anticipated d/c is to: Home              Anticipated d/c date is: 3 days              Patient currently is not medically stable to d/c.        I/O last 3 completed shifts: In: 2774.8 [I.V.:2574.8; IV Piggyback:200] Out: 500 [Urine:500] Total I/O In: 120 [P.O.:120] Out: -      Consultants:   General surgery and IR.  Procedures: None  Antimicrobials: None  Subjective: Patient still has complaining of intermittent abdominal pain, no nausea vomiting. No diarrhea. He has some short of breath with exertion, he is a placed on 3 L oxygen today. He has no cough, no chest pain or palpitation. No fever or chills. No dysuria or hematuria.  Objective: Vitals:   02/18/20 0427 02/18/20 0553 02/18/20 0800 02/18/20 1152  BP: (!) 133/115 102/84 119/87 107/90  Pulse: (!) 110 (!) 110 (!) 117 (!) 115  Resp: Temp: 98.3 F (36.8 C) 98.1 F (36.7 C) (!) 97.3 F (36.3 C)  PROGRESS NOTE    Dedrick Heffner  ZOX:[id number] DOB: [birth year] DOA: 02/16/2020 PCP: Wilson Singer, MD   Complaint. Shortness of breath and abdominal pain. Brief Narrative:  Tranell Wojtkiewicz Fisheris a 44 y.o.malewith medical history significant forpain and swelling in his right leg for the last 1 month. He presents to the ER for evaluation 2 days of nausea andpain in the right upper quadrant and epigastriumas well as dry cough. Patient has a known history of gall bladder disease and was placed on oral antibiotic therapy and advised to follow up with surgery. He presents for evaluation of shortness of breath initially with exertion/activity but now short of breath at rest. He is unable to lay flat in bed.Shortness of breath is associated with pleuritic chest discomfort and bilateral leg swelling.  CT angiogram of the chest showed bilateral pulmonary emboli, he also has acalculus cholecystitis. Patient was placed on heparin drip, he is also seen by general surgeon. Currently covered with Zosyn. Echo showed Ef <20%, patient developed hypoxemia.   Assessment & Plan:   Principal Problem:   Pulmonary embolism (HCC) Active Problems:   Essential hypertension   Depression, major, single episode, moderate (HCC)   Acute acalculous cholecystitis  #1. Bilateral pulmonary emboli. Hypoxemic respiratory failure. CT angiogram showed bilateral extensive pulmonary emboli, patient still has significant tachycardia, developed some hypoxemia today. Currently he is on 3 L oxygen. Currently on heparin drip. We will continue, will change to oral anticoagulation if no surgical procedure is planned.  #2. Acute acalculous cholecystitis. Patient has evaluate by general surgery and interventional radiology. Currently decision was made to hold off cholecystomy drain. Concerned patient will not be able to take off heparin for procedure. We will continue IV antibiotics, monitor closely.  #3. Essential  hypertension. Continue some home medicine  #4 pure essential hypertension. Continue to monitor blood pressure.  #5. Acute hypoxemic respiratory failure. Acute on chronic systolic CHF. Elevated troponin. Echo showed EF <20%. Will consult cardiology. Start IV lasix, dc IVF. Repeat troponin. Initial EKG no st elevation, frequent PVCs.     DVT prophylaxis: Heparin drip. Code Status: Full Family Communication: None Disposition Plan:  .   Status is: Inpatient  Remains inpatient appropriate because:Inpatient level of care appropriate due to severity of illness   Dispo: The patient is from: Home              Anticipated d/c is to: Home              Anticipated d/c date is: 3 days              Patient currently is not medically stable to d/c.        I/O last 3 completed shifts: In: 2774.8 [I.V.:2574.8; IV Piggyback:200] Out: 500 [Urine:500] Total I/O In: 120 [P.O.:120] Out: -      Consultants:   General surgery and IR.  Procedures: None  Antimicrobials: None  Subjective: Patient still has complaining of intermittent abdominal pain, no nausea vomiting. No diarrhea. He has some short of breath with exertion, he is a placed on 3 L oxygen today. He has no cough, no chest pain or palpitation. No fever or chills. No dysuria or hematuria.  Objective: Vitals:   02/18/20 0427 02/18/20 0553 02/18/20 0800 02/18/20 1152  BP: (!) 133/115 102/84 119/87 107/90  Pulse: (!) 110 (!) 110 (!) 117 (!) 115  Resp: Temp: 98.3 F (36.8 C) 98.1 F (36.7 C) (!) 97.3 F (36.3 C)  Date: 02/16/2020 CLINICAL DATA:  BILATERAL leg pain, pulmonary emboli on CT angio chest EXAM: BILATERAL LOWER EXTREMITY VENOUS DOPPLER ULTRASOUND TECHNIQUE: Gray-scale sonography with compression, as well as color and duplex ultrasound, were performed to evaluate the deep venous system(s) from the level of the common femoral vein through the popliteal and proximal calf veins. COMPARISON:  None FINDINGS: VENOUS RIGHT lower extremity: Normal compressibility of the common femoral, superficial femoral, and popliteal veins, as well as the visualized calf veins. Visualized portions of profunda femoral vein and great saphenous vein unremarkable. No filling defects to suggest DVT on grayscale or color Doppler imaging. Doppler waveforms show normal direction of venous flow, normal respiratory plasticity and response to augmentation. LEFT lower extremity: Small amount of nonocclusive thrombus identified within LEFT common femoral vein, with impaired compressibility. This extends to involve the saphenofemoral junction. Remaining deep venous system in the LEFT lower extremity is patent and compressible. Remaining veins demonstrate spontaneous venous flow. Greater saphenous vein patent. OTHER N/A Limitations: None  IMPRESSION: Nonocclusive thrombus within LEFT common femoral vein. Remaining deep venous system of the LEFT lower extremity is normal. No evidence of deep venous thrombosis in the RIGHT lower extremity. Electronically Signed   By: Ulyses Southward M.D.   On: 02/16/2020 14:48   ECHOCARDIOGRAM COMPLETE  Result Date: 02/17/2020    ECHOCARDIOGRAM REPORT   Patient Name:   Ryan Gibson Date of Exam: 02/16/2020 Medical Rec #:  [id number]           Height:       73.0 in Accession #:    [id number]          Weight:       245.0 lb Date of Birth:  03-Apr-1975          BSA:          2.345 m Patient Age:    44 years            BP:           112/96 mmHg Patient Gender: M                   HR:           104 bpm. Exam Location:  [facility] Procedure: 2D Echo, Cardiac Doppler and Color Doppler Indications:     Pulmonary Embolus 415.19 / I26.99  History:         Patient has no prior history of Echocardiogram examinations.  Sonographer:     Neysa Bonito Roar Referring Phys:  [id number] TTSVXBLT AGBATA Diagnosing Phys: Arnoldo Hooker MD IMPRESSIONS  1. Left ventricular ejection fraction, by estimation, is <20%. The left ventricle has severely decreased function. The left ventricle demonstrates global hypokinesis. The left ventricular internal cavity size was severely dilated. There is mild left ventricular hypertrophy. Left ventricular diastolic parameters are consistent with Grade I diastolic dysfunction (impaired relaxation).  2. Right ventricular systolic function is low normal. The right ventricular size is mildly enlarged.  3. The mitral valve is normal in structure. Moderate to severe mitral valve regurgitation.  4. Tricuspid valve regurgitation is moderate.  5. The aortic valve is normal in structure. Aortic valve regurgitation is not visualized. FINDINGS  Left Ventricle: Left ventricular ejection fraction, by estimation, is <20%. The left ventricle has severely decreased function. The left ventricle demonstrates global hypokinesis. The  left ventricular internal cavity size was severely dilated. There is mild left ventricular hypertrophy. Left ventricular diastolic parameters are consistent with Grade I diastolic dysfunction (impaired relaxation). Right

## 2020-02-18 NOTE — Consult Note (Signed)
Pharmacy Antibiotic Note  Ryan Gibson is a 44 y.o. male admitted on 02/16/2020 with Intra-abdominal Infection. Pt presented with shortness of breath. PMH includes pain and swelling in R leg x 1 month, HTN, and depression. Pt has known history of gallbladder disease. Pt being followed by IR and general surgery for evaluation of cholecystitis. Pharmacy has been consulted for Zosyn dosing.   WBC WNL, Scr < 1 stable, afebrile  12/22: abd [place]: Gallbladder wall edema with pericholecystic free fluid and positive sonographic Murphy's sign. 12/26 CT chest: Bilateral pulmonary emboli worst in the right lower lobe. No findings to suggest right heart strain are noted.  Day 3 abx  Plan: Continue Zosyn 3.375 g (extended infusion) Q8H   Height: 6\' 2"  (188 cm) Weight: 121.8 kg (268 lb 8 oz) IBW/kg (Calculated) : 82.2  Temp (24hrs), Avg:98 F (36.7 C), Min:97.3 F (36.3 C), Max:98.7 F (37.1 C)  Recent Labs  Lab 02/16/20 0939 02/16/20 1117 02/17/20 0400  WBC 11.0*  --  9.9  CREATININE 1.00  --  0.92  LATICACIDVEN 2.6* 1.4  --     Estimated Creatinine Clearance: 142 mL/min (by C-G formula based on SCr of 0.92 mg/dL).    Allergies  Allergen Reactions  . No Known Allergies   . Vancomycin Hives    hi8vess post op on R arm -site of infusion, and whole body itching    Antimicrobials this admission: 12/26 Zosyn >>   Thank you for allowing pharmacy to be a part of this patient's care.  1/27, PharmD Pharmacy Resident  02/18/2020 1:52 PM

## 2020-02-18 NOTE — Progress Notes (Signed)
Patient states he is short of breath. Placed on 4l nasal cannula with oxygen level 95%. Heart rate in low 120s, patient states that this is how he normally runs.  Dr. Chipper Herb made aware, per MD, will continue to monitor. MD will decrease iv fluid rates

## 2020-02-18 NOTE — Consult Note (Signed)
ANTICOAGULATION CONSULT NOTE   Pharmacy Consult for Heparin  Indication: pulmonary embolus  Patient Measurements: Heparin Dosing Weight: 104 kg   Labs: Recent Labs    02/16/20 0937 02/16/20 0939 02/16/20 1117 02/16/20 1835 02/17/20 0400 02/17/20 1100 02/17/20 1930 02/18/20 0524 02/18/20 1503  HGB  --  15.5  --   --  14.6  --   --   --   --   HCT  --  47.6  --   --  45.2  --   --   --   --   PLT  --  273  --   --  229  --   --   --   --   APTT  --   --  31  --   --   --   --   --   --   LABPROT 17.0*  --   --   --   --   --   --   --   --   INR 1.4*  --   --   --   --   --   --   --   --   HEPARINUNFRC  --   --   --    < > 0.21*   < > 0.16* 0.25* 0.19*  CREATININE  --  1.00  --   --  0.92  --   --   --   --   TROPONINIHS  --  50*  --   --   --   --   --   --   --    < > = values in this interval not displayed.    Estimated Creatinine Clearance: 142 mL/min (by C-G formula based on SCr of 0.92 mg/dL).   Medical History: Past Medical History:  Diagnosis Date  . Asthma    AS A CHILD ONLY  . Foot pain, right    painful x 1 month and swelling    Medications:  Confirmed with patient no anticoagulants at this time.    Assessment: Patient is a 44 y/o M with medical history as above who presented to the ED 12/26 with shortness of breath and bilateral leg swelling. Subsequently found to have bilateral pulmonary emboli as well as non-occlusive thrombus in left common femoral vein. Pharmacy consulted to initiate heparin infusion for PE.  Baseline aPTT 31 seconds, INR 1.4. Baseline CBC within normal limits.  Heparin course: 12/26 1121: IV heparin 5000 unit bolus x 1 followed by continuous infusion at 1650 units/hr 12/26 1835 HL > 3.6, supratherapeutic. RN held drip at 2005 12/26 2045 HL 0.15 (re-check with drip off).  12/27 0400 HL 0.21, SUBtherapeutic,  3000 unit IV x 1 and increased  infusion to 1600 units/h 1227 1100 HL < 0.10, subtherapeutic  1227 1930 HL = 0.16  subtherapeutic  12/27 1932 HL 0.16, subtherapeutic 12/28 0524 HL 0.25, subtherapeutic 12/28 1503 HL 0.19, subtherapeutic   Goal of Therapy:  Heparin level 0.3-0.7 units/ml Monitor platelets by anticoagulation protocol: Yes   Plan:  Heparin 1600 unit bolus followed by increase in heparin drip to 2700 units/hr. Recheck HL at 2300. Daily CBC per protocol while on heparin drip.  Laureen Ochs, PharmD 02/18/2020 4:20 PM

## 2020-02-18 NOTE — Consult Note (Signed)
ANTICOAGULATION CONSULT NOTE   Pharmacy Consult for Heparin  Indication: pulmonary embolus  Patient Measurements: Heparin Dosing Weight: 104 kg   Labs: Recent Labs    02/16/20 0937 02/16/20 0939 02/16/20 1117 02/16/20 1835 02/17/20 0400 02/17/20 1100 02/17/20 1930 02/18/20 0524  HGB  --  15.5  --   --  14.6  --   --   --   HCT  --  47.6  --   --  45.2  --   --   --   PLT  --  273  --   --  229  --   --   --   APTT  --   --  31  --   --   --   --   --   LABPROT 17.0*  --   --   --   --   --   --   --   INR 1.4*  --   --   --   --   --   --   --   HEPARINUNFRC  --   --   --    < > 0.21* <0.10* 0.16* 0.25*  CREATININE  --  1.00  --   --  0.92  --   --   --   TROPONINIHS  --  50*  --   --   --   --   --   --    < > = values in this interval not displayed.    Estimated Creatinine Clearance: 142 mL/min (by C-G formula based on SCr of 0.92 mg/dL).   Medical History: Past Medical History:  Diagnosis Date   Asthma    AS A CHILD ONLY   Foot pain, right    painful x 1 month and swelling    Medications:  Confirmed with patient no anticoagulants at this time.    Assessment: Patient is a 44 y/o M with medical history as above who presented to the ED 12/26 with shortness of breath and bilateral leg swelling. Subsequently found to have bilateral pulmonary emboli as well as non-occlusive thrombus in left common femoral vein. Pharmacy consulted to initiate heparin infusion for PE.  Baseline aPTT 31 seconds, INR 1.4. Baseline CBC within normal limits.  Heparin course: 12/26 at 1121: IV heparin 5000 unit bolus x 1 followed by continuous infusion at 1650 units/hr 12/26 at 1835 HL > 3.6, supratherapeutic. RN held drip at 2005 12/26 at 2045 HL 0.15 (re-check with drip off).  12/27 at 0400 HL 0.21, SUBtherapeutic,  3000 unit IV x 1 and increased  infusion to 1600 units/h 1227 1100: HL < 0.10, subtherapeutic  1227 1930: HL = 0.16 subtherapeutic   Goal of Therapy:  Heparin level  0.3-0.7 units/ml Monitor platelets by anticoagulation protocol: Yes   Plan:   12/27:  HL @ 1930 = 0.16 12/28:  HL @ 0524 = 0.25, subtherapeutic  Will order Heparin 1500 units IV X 1 and increase drip rate to 2500 units/hr. Will recheck HL 6 hrs after rate change.  ----Daily CBC per protocol while on heparin drip --Follow-up plan to transition to oral anticoagulant   Otelia Sergeant, PharmD, [facility] 02/18/2020 6:40 AM

## 2020-02-19 ENCOUNTER — Inpatient Hospital Stay: Payer: BC Managed Care – PPO

## 2020-02-19 ENCOUNTER — Ambulatory Visit (HOSPITAL_COMMUNITY): Payer: BC Managed Care – PPO | Admitting: Physical Therapy

## 2020-02-19 ENCOUNTER — Other Ambulatory Visit: Payer: Self-pay | Admitting: Family Medicine

## 2020-02-19 DIAGNOSIS — K810 Acute cholecystitis: Secondary | ICD-10-CM | POA: Diagnosis not present

## 2020-02-19 DIAGNOSIS — I2699 Other pulmonary embolism without acute cor pulmonale: Secondary | ICD-10-CM | POA: Diagnosis not present

## 2020-02-19 LAB — BASIC METABOLIC PANEL
Anion gap: 11 (ref 5–15)
BUN: 14 mg/dL (ref 6–20)
CO2: 23 mmol/L (ref 22–32)
Calcium: 8.1 mg/dL — ABNORMAL LOW (ref 8.9–10.3)
Chloride: 100 mmol/L (ref 98–111)
Creatinine, Ser: 1.07 mg/dL (ref 0.61–1.24)
GFR, Estimated: >60 mL/min (ref >60)
Glucose, Bld: 78 mg/dL (ref 70–99)
Potassium: 3.6 mmol/L (ref 3.5–5.1)
Sodium: 134 mmol/L — ABNORMAL LOW (ref 135–145)

## 2020-02-19 LAB — CBC WITH DIFFERENTIAL/PLATELET
Abs Immature Granulocytes: 0.04 10*3/uL (ref 0.00–0.07)
Basophils Absolute: 0.1 10*3/uL (ref 0.0–0.1)
Basophils Relative: 1 %
Eosinophils Absolute: 0.1 10*3/uL (ref 0.0–0.5)
Eosinophils Relative: 1 %
HCT: 43.5 % (ref 39.0–52.0)
Hemoglobin: 14.7 g/dL (ref 13.0–17.0)
Immature Granulocytes: 0 %
Lymphocytes Relative: 28 %
Lymphs Abs: 3.2 10*3/uL (ref 0.7–4.0)
MCH: 31.1 pg (ref 26.0–34.0)
MCHC: 33.8 g/dL (ref 30.0–36.0)
MCV: 92 fL (ref 80.0–100.0)
Monocytes Absolute: 1 10*3/uL (ref 0.1–1.0)
Monocytes Relative: 9 %
Neutro Abs: 7.1 10*3/uL (ref 1.7–7.7)
Neutrophils Relative %: 61 %
Platelets: 204 10*3/uL (ref 150–400)
RBC: 4.73 MIL/uL (ref 4.22–5.81)
RDW: 15.9 % — ABNORMAL HIGH (ref 11.5–15.5)
WBC: 11.5 10*3/uL — ABNORMAL HIGH (ref 4.0–10.5)
nRBC: 0.2 % (ref 0.0–0.2)

## 2020-02-19 LAB — HEPARIN LEVEL (UNFRACTIONATED)
Heparin Unfractionated: 0.27 IU/mL — ABNORMAL LOW (ref 0.30–0.70)
Heparin Unfractionated: 0.12 IU/mL — ABNORMAL LOW (ref 0.30–0.70)

## 2020-02-19 LAB — MAGNESIUM: Magnesium: 1.6 mg/dL — ABNORMAL LOW (ref 1.7–2.4)

## 2020-02-19 MED ORDER — TECHNETIUM TC 99M MEBROFENIN IV KIT
5.1300 | PACK | Freq: Once | INTRAVENOUS | Status: AC | PRN
  Administered 2020-02-19: 5.13 via INTRAVENOUS

## 2020-02-19 MED ORDER — HEPARIN BOLUS VIA INFUSION
3000.0000 [IU] | Freq: Once | INTRAVENOUS | Status: AC
  Administered 2020-02-19: 3000 [IU] via INTRAVENOUS
  Filled 2020-02-19: qty 3000

## 2020-02-19 MED ORDER — HEPARIN (PORCINE) 25000 UT/250ML-% IV SOLN
3350.0000 [IU]/h | INTRAVENOUS | Status: DC
  Administered 2020-02-19 (×2): 3000 [IU]/h via INTRAVENOUS
  Administered 2020-02-19: 3200 [IU]/h via INTRAVENOUS
  Administered 2020-02-20 (×3): 3550 [IU]/h via INTRAVENOUS
  Administered 2020-02-21: 3350 [IU]/h via INTRAVENOUS
  Administered 2020-02-21: 3550 [IU]/h via INTRAVENOUS
  Filled 2020-02-19 (×8): qty 250

## 2020-02-19 MED ORDER — OXYCODONE-ACETAMINOPHEN 5-325 MG PO TABS
1.0000 | ORAL_TABLET | Freq: Four times a day (QID) | ORAL | Status: DC | PRN
  Administered 2020-02-19 – 2020-02-21 (×7): 1 via ORAL
  Filled 2020-02-19 (×7): qty 1

## 2020-02-19 MED ORDER — HEPARIN BOLUS VIA INFUSION
1650.0000 [IU] | Freq: Once | INTRAVENOUS | Status: AC
  Administered 2020-02-19: 1650 [IU] via INTRAVENOUS
  Filled 2020-02-19: qty 1650

## 2020-02-19 MED ORDER — MAGNESIUM SULFATE 2 GM/50ML IV SOLN
2.0000 g | Freq: Once | INTRAVENOUS | Status: AC
  Administered 2020-02-19: 2 g via INTRAVENOUS
  Filled 2020-02-19: qty 50

## 2020-02-19 MED ORDER — METOPROLOL SUCCINATE ER 25 MG PO TB24
25.0000 mg | ORAL_TABLET | Freq: Every day | ORAL | Status: DC
  Administered 2020-02-19 – 2020-02-22 (×4): 25 mg via ORAL
  Filled 2020-02-19 (×4): qty 1

## 2020-02-19 MED ORDER — SACUBITRIL-VALSARTAN 24-26 MG PO TABS
1.0000 | ORAL_TABLET | Freq: Two times a day (BID) | ORAL | Status: DC
  Administered 2020-02-19 – 2020-02-21 (×6): 1 via ORAL
  Filled 2020-02-19 (×6): qty 1

## 2020-02-19 MED ORDER — MORPHINE SULFATE (PF) 4 MG/ML IV SOLN
3.0000 mg | INTRAVENOUS | Status: AC | PRN
  Administered 2020-02-19: 3 mg via INTRAVENOUS
  Filled 2020-02-19: qty 1

## 2020-02-19 NOTE — Consult Note (Signed)
   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  Moderate tricuspid regurgitation.  Moderate to severe mitral regurgitation.  He presented with complaints of pain and swelling of the right leg for at least 1 month.  Also complained of nausea and pain in the right upper quadrant along with a dry cough.  Chest x-ray revealed large amount of the cardiac silhouette.  CTA of the chest revealed bilateral pulmonary edema worse in the right lower lobe.  Comorbidities:  Essential hypertension Depression Gout   Medication:  Furosemide 40 mg IV every 12 hours Metoprolol succinate 25 mg daily Entresto 24/26 mg 1 tablet twice a day   Labs:  Sodium 134, potassium 3.6, chloride 100, CO2 23, BUN 14, creatinine 1.07, magnesium 1.6, NP on admission 2121. Intake 890 mL Output 2900 mL Weight 121.8 kg BMI 34.47 BP 119/76    Assessment:   General-he is awake and alert lying in bed, currently hiccuping.  HEENT-pulls are equal and reacting to light, no JVD noted.  Cardiac-heart tones of regular rate and rhythm, murmurs or gallops appreciated.  Chest-lungs are clear to posterior auscultation  Abdomen-tender to palpation  Musculoskeletal-no lower extremity edema noted.  Psych-is pleasant and appropriate, makes good eye contact.  Neurologic-speech is clear, moves all 4 extremities without difficulty.    Initial visit with patient, his wife is not at the bedside at this time.  He states at home he had noted orthopnea and lower extremity swelling for at least 2 months.  Discussed with him the importance of daily weights, recording and reporting to physician or the heart failure clinic in increase of 2 to 3 pounds overnight or 5 pounds within the week.  So discussed the importance of fluid restriction along with limiting his salt/sodium intake.  Stressed the importance of removing the saltshaker from the table as he states he does  salt his food.  Made aware of the heart failure videos but he would rather review those with his wife and she will be visiting with him tomorrow.  He was given the heart failure teaching booklet along with his zone magnet and information card about the outpatient heart failure clinic.  We will continue to follow along.   Tresa Endo RN, CHFN

## 2020-02-19 NOTE — Consult Note (Signed)
ANTICOAGULATION CONSULT NOTE   Pharmacy Consult for Heparin  Indication: pulmonary embolus  Patient Measurements: Heparin Dosing Weight: 104 kg   Labs: Recent Labs    02/17/20 0400 02/17/20 1100 02/18/20 1813 02/18/20 2109 02/19/20 0605 02/19/20 0914 02/19/20 1942  HGB 14.6  --   --   --  14.7  --   --   HCT 45.2  --   --   --  43.5  --   --   PLT 229  --   --   --  204  --   --   HEPARINUNFRC 0.21*   < >  --  0.14*  --  0.27* 0.12*  CREATININE 0.92  --   --   --  1.07  --   --   TROPONINIHS  --   --  38* 46*  --   --   --    < > = values in this interval not displayed.    Estimated Creatinine Clearance: 122.1 mL/min (by C-G formula based on SCr of 1.07 mg/dL).   Medical History: Past Medical History:  Diagnosis Date  . Asthma    AS A CHILD ONLY  . Foot pain, right    painful x 1 month and swelling    Medications:  Confirmed with patient no anticoagulants at this time.    Assessment: Patient is a 44 y/o M with medical history as above who presented to the ED on 12/26 with shortness of breath and bilateral leg swelling. Subsequently found to have bilateral pulmonary emboli as well as non-occlusive thrombus in left common femoral vein. Pharmacy consulted to dose heparin infusion for PE.  12/26 CT chest: Bilateral pulmonary emboli worst in the right lower lobe. No findings to suggest right heart strain are noted. 12/26 LE [place]: Nonocclusive thrombus within LEFT common femoral vein 12/29: pt underwent gallbladder ultrasound for preparation of percutaneous cholecystostomy drainage and HIDA scan - unclear at this time if heparin gtt stopped for procedure per nursing - will follow up this afternoon.  Baseline aPTT 31 seconds, INR 1.4. H&H, platelets WNL.  Heparin course: 12/26 1121: IV heparin 5000 unit bolus x 1 followed by continuous infusion at 1650 units/hr 12/26 1835 HL > 3.6, supratherapeutic. RN held drip at 2005 12/26 2045 HL 0.15 (re-check with drip off).   12/27 0400 HL 0.21, SUBtherapeutic,  3000 unit IV x 1 and increased  infusion to 1600 units/h 1227 1100 HL < 0.10, subtherapeutic  1227 1930 HL = 0.16 subtherapeutic  12/27 1932 HL 0.16, subtherapeutic 12/28 0524 HL 0.25, subtherapeutic 12/28 1503 HL 0.19, subtherapeutic 12/28 0311 HL 0.18, subtherapeutic 12/29 0914 HL 0.27, subtherapeutic 12/29 1942 HL 0.12; subtherapeutic (no interruption per RN since 1400 charted)  Goal of Therapy:  Heparin level 0.3-0.7 units/ml Monitor platelets by anticoagulation protocol: Yes   Plan:  No interruption in gtt per RN, given subtherapeutic still will bolus and increase as below.  Heparin 3000 unit (~30u/k) bolus followed by increase in heparin drip to 3550 units/hr. (~3.5u/k/h increase)   Requirement becoming very high. If no response to bolus may need to consider alternative monitoring or agent if potentially heparin resistant. Recheck HL 6 hours after dose adjustment and bolus  Daily CBC per protocol while on heparin drip.  Martyn Malay, PharmD Pharmacy Resident  02/19/2020 8:42 PM

## 2020-02-19 NOTE — Progress Notes (Signed)
   02/18/20 0800  Assess: MEWS Score  Temp (!) 97.3 F (36.3 C)  BP 119/87  Pulse Rate (!) 117  Resp 20  SpO2 98 %  O2 Device Room Air  Assess: MEWS Score  MEWS Temp 0  MEWS Systolic 0  MEWS Pulse 2  MEWS RR 0  MEWS LOC 0  MEWS Score 2  MEWS Score Color Yellow  Assess: if the MEWS score is Yellow or Red  Were vital signs taken at a resting state? Yes  Focused Assessment No change from prior assessment  Early Detection of Sepsis Score *See Row Information* Low  MEWS guidelines implemented *See Row Information* Yes  Treat  MEWS Interventions Escalated (See documentation below)  Take Vital Signs  Increase Vital Sign Frequency  Yellow: Q 2hr X 2 then Q 4hr X 2, if remains yellow, continue Q 4hrs  Escalate  MEWS: Escalate Yellow: discuss with charge nurse/RN and consider discussing with provider and RRT  Notify: Charge Nurse/RN  Name of Charge Nurse/RN Notified stephanie   Date Charge Nurse/RN Notified 02/18/20  Time Charge Nurse/RN Notified 0800  Inserted for [employer] RN

## 2020-02-19 NOTE — Progress Notes (Signed)
SURGICAL PROGRESS NOTE   Hospital Day(s): 3.   Post op day(s):  Marland Kitchen   Interval History: Patient seen and examined, no acute events or new complaints overnight. Patient reports continue having severe abdominal pain in the right upper quadrant. Denies nausea today. Had vomiting yesterday. No fever or chills.   Vital signs in last 24 hours: [min-max] current  Temp:  [97.9 F (36.6 C)-100.1 F (37.8 C)] 98 F (36.7 C) (12/29 0328) Pulse Rate:  [52-122] 101 (12/29 0752) Resp:  [18-20] 19 (12/29 0752) BP: (92-124)/(65-102) 124/94 (12/29 0752) SpO2:  [93 %-100 %] 100 % (12/29 0752)     Height: 6\' 2"  (188 cm) Weight: 121.8 kg BMI (Calculated): 34.46   Physical Exam:  Constitutional: alert, cooperative and no distress  Respiratory: breathing non-labored at rest  Cardiovascular: regular rate and sinus rhythm  Gastrointestinal: soft, tender to palpation in the right upper quadrant, and non-distended  Labs:  CBC Latest Ref Rng & Units 02/19/2020 02/17/2020 02/16/2020  WBC 4.0 - 10.5 K/uL 11.5(H) 9.9 11.0(H)  Hemoglobin 13.0 - 17.0 g/dL 02/18/2020 64.4 03.4  Hematocrit 39.0 - 52.0 % 43.5 45.2 47.6  Platelets 150 - 400 K/uL 204 229 273   CMP Latest Ref Rng & Units 02/19/2020 02/17/2020 02/16/2020  Glucose 70 - 99 mg/dL 78 99 02/18/2020)  BUN 6 - 20 mg/dL 14 12 11   Creatinine 0.61 - 1.24 mg/dL 595(G 3.87  Sodium 135 - 145 mmol/L 134(L) 133(L) 133(L)  Potassium 3.5 - 5.1 mmol/L 3.6 4.5 4.1  Chloride 98 - 111 mmol/L 100 103 98  CO2 22 - 32 mmol/L 23 23 24   Calcium 8.9 - 10.3 mg/dL 8.1(L) 8.2(L) 8.7(L)  Total Protein 6.5 - 8.1 g/dL - - 6.5  Total Bilirubin 0.3 - 1.2 mg/dL - - 1.7(H)  Alkaline Phos 38 - 126 U/L - - 62  AST 15 - 41 U/L - - 36  ALT 0 - 44 U/L - - 77(H)    Imaging studies: I evaluated the images of the ultrasound. There is persistent gallbladder wall thickening. Sonographer reported no Murphy's sign. On my physical exam there is severe tenderness to minimal palpation on the  right upper quadrant that it is almost impossible to do the Murphy's sign because patient does not let you keep your hand in the right upper quadrant.   Assessment/Plan:  44 y.o.malewith acute versus subacute cholecystitis, complicated by pertinent comorbidities includingacute pulmonary embolism.  Continue with severe right upper quadrant abdominal pain. Physical exam and images are consistent with acute over chronic cholecystitis. Patient has been 72+ hours with IV antibiotic therapy and there has been no improvement on right upper quadrant pain. I discussed case with Dr. 3.32 yesterday and plan was to repeat gallbladder ultrasound for preparation for percutaneous cholecystostomy drainage. Also discussed about doing HIDA scan. Both are going to be done today. Hopefully percutaneous cholecystostomy can be done after imaging workup. I do not anticipate much improvement of cholecystitis with abx therapy at this point. IR will coordinate with nursing staff and pharmacy to hold heparin before procedure.   , MD

## 2020-02-19 NOTE — Progress Notes (Signed)
PROGRESS NOTE    Ryan Gibson  ZOX:[id number] DOB: [birth year] DOA: 02/16/2020 PCP: Wilson Singer, MD    Assessment & Plan:   Principal Problem:   Pulmonary embolism (HCC) Active Problems:   Essential hypertension   Depression, major, single episode, moderate (HCC)   Acute acalculous cholecystitis   Bilateral pulmonary emboli: continue on IV heparin drip and will switch po eliquis prior to d/c. Encourage incentive spirometry   Leucocytosis: likely reactive. Will continue to monitor   Acute hypoxic respiratory failure: secondary to b/l PE. Resolved  Acute acalculous cholecystitis: hold off on cholecystectomy drain as per general surg & IR. HIDA shows patent cystic duct, no evidence of acute cholecystitis. Continue on IV zosyn   HTN: resolved. No on any anti-HTN meds at home   Hyponatremia: labile. Will continue to monitor   Hypomagnesemia: mg sulfate ordered. Will continue to monitor    DVT prophylaxis: heparin  Code Status: full  Family Communication: discussed pt's care w / pt's mother who is at bedside and answered her questions  Disposition Plan:  Likely d/c back home  Status is: Inpatient  Remains inpatient appropriate because:Ongoing active pain requiring inpatient pain management, Ongoing diagnostic testing needed not appropriate for outpatient work up and IV treatments appropriate due to intensity of illness or inability to take PO   Dispo: The patient is from: Home              Anticipated d/c is to: Home              Anticipated d/c date is: 3 days              Patient currently is not medically stable to d/c.      Consultants:   General surg   Procedures:    Antimicrobials:    Subjective: Pt c/o RUQ pain still   Objective: Vitals:   02/18/20 2116 02/19/20 0015 02/19/20 0328 02/19/20 0752  BP: 110/86 (!) 113/99 109/69 (!) 124/94  Pulse: (!) 52 100 99 (!) 101  Resp: 18 19 18 19   Temp: 98.9 F (37.2 C)  98 F (36.7 C)    TempSrc: Oral  Oral   SpO2: 95% 93% 100% 100%  Weight:      Height:        Intake/Output Summary (Last 24 hours) at 02/19/2020 0858 Last data filed at 02/19/2020 0810 Gross per 24 hour  Intake 808.96 ml  Output 3900 ml  Net -3091.04 ml   Filed Weights   02/16/20 0941 02/17/20 2040 02/18/20 0553  Weight: 111.1 kg 121.8 kg 121.8 kg    Examination:  General exam: Appears calm and comfortable  Respiratory system: diminished breath sounds b/l  Cardiovascular system: S1 & S2 +. No rubs, gallops or clicks.  Gastrointestinal system: Abdomen is nondistended, soft and tenderness to palpation. Hypoactive bowel sounds heard. Central nervous system: Alert and oriented. Moves all 4 extremities Psychiatry: Judgement and insight appear normal. Flat mood and affect     Data Reviewed: I have personally reviewed following labs and imaging studies  CBC: Recent Labs  Lab 02/16/20 0939 02/17/20 0400 02/19/20 0605  WBC 11.0* 9.9 11.5*  NEUTROABS 7.4  --  7.1  HGB 15.5 14.6 14.7  HCT 47.6 45.2 43.5  MCV 93.9 94.8 92.0  PLT 273 229 204   Basic Metabolic Panel: Recent Labs  Lab 02/16/20 0939 02/17/20 0400 02/19/20 0605  NA 133* 133* 134*  K 4.1 4.5 3.6  CL 98 103 100  CO2 24 23 23   GLUCOSE 130* 99 78  BUN 11 12 14   CREATININE 1.00 0.92 1.07  CALCIUM 8.7* 8.2* 8.1*  MG  --   --  1.6*   GFR: Estimated Creatinine Clearance: 122.1 mL/min (by C-G formula based on SCr of 1.07 mg/dL). Liver Function Tests: Recent Labs  Lab 02/16/20 0939  AST 36  ALT 77*  ALKPHOS 62  BILITOT 1.7*  PROT 6.5  ALBUMIN 3.7   Recent Labs  Lab 02/16/20 0939  LIPASE 27   No results for input(s): AMMONIA in the last 168 hours. Coagulation Profile: Recent Labs  Lab 02/16/20 0937  INR 1.4*   Cardiac Enzymes: No results for input(s): CKTOTAL, CKMB, CKMBINDEX, TROPONINI in the last 168 hours. BNP (last 3 results) No results for input(s): PROBNP in the last 8760 hours. HbA1C: No results  for input(s): HGBA1C in the last 72 hours. CBG: No results for input(s): GLUCAP in the last 168 hours. Lipid Profile: No results for input(s): CHOL, HDL, LDLCALC, TRIG, CHOLHDL, LDLDIRECT in the last 72 hours. Thyroid Function Tests: No results for input(s): TSH, T4TOTAL, FREET4, T3FREE, THYROIDAB in the last 72 hours. Anemia Panel: No results for input(s): VITAMINB12, FOLATE, FERRITIN, TIBC, IRON, RETICCTPCT in the last 72 hours. Sepsis Labs: Recent Labs  Lab 02/16/20 0865 02/16/20 1117  LATICACIDVEN 2.6* 1.4    Recent Results (from the past 240 hour(s))  Culture, blood (Routine x 2)     Status: None (Preliminary result)   Collection Time: 02/16/20  9:39 AM   Specimen: BLOOD  Result Value Ref Range Status   Specimen Description BLOOD LEFT HAND  Final   Special Requests   Final    BOTTLES DRAWN AEROBIC AND ANAEROBIC Blood Culture adequate volume   Culture   Final    NO GROWTH 3 DAYS Performed at [facility], [address], [place], [place] [address]    Report Status PENDING  Incomplete  Culture, blood (Routine x 2)     Status: None (Preliminary result)   Collection Time: 02/16/20  9:39 AM   Specimen: BLOOD  Result Value Ref Range Status   Specimen Description BLOOD LEFT AC  Final   Special Requests   Final    BOTTLES DRAWN AEROBIC AND ANAEROBIC Blood Culture adequate volume   Culture   Final    NO GROWTH 3 DAYS Performed at [facility], [address] [place], [place] [address]    Report Status PENDING  Incomplete  Resp Panel by RT-PCR (Flu A&B, Covid) Nasopharyngeal Swab     Status: None   Collection Time: 02/16/20  9:52 AM   Specimen: Nasopharyngeal Swab; Nasopharyngeal(NP) swabs in vial transport medium  Result Value Ref Range Status   SARS Coronavirus 2 by RT PCR NEGATIVE NEGATIVE Final    Comment: (NOTE) SARS-CoV-2 target nucleic acids are NOT DETECTED.  The SARS-CoV-2 RNA is generally detectable in upper respiratory specimens during the  acute phase of infection. The lowest concentration of SARS-CoV-2 viral copies this assay can detect is 138 copies/mL. A negative result does not preclude SARS-Cov-2 infection and should not be used as the sole basis for treatment or other patient management decisions. A negative result may occur with  improper specimen collection/handling, submission of specimen other than nasopharyngeal swab, presence of viral mutation(s) within the areas targeted by this assay, and inadequate number of viral copies(<138 copies/mL). A negative result must be combined with clinical observations, patient history, and epidemiological information. The expected result is Negative.  Fact Sheet for Patients:  BloggerCourse.com  Fact Sheet for Healthcare Providers:  SeriousBroker.it  This test is no t yet approved or cleared by the [place] FDA and  has been authorized for detection and/or diagnosis of SARS-CoV-2 by FDA under an Emergency Use Authorization (EUA). This EUA will remain  in effect (meaning this test can be used) for the duration of the COVID-19 declaration under Section 564(b)(1) of the Act, 21 U.S.C.section 360bbb-3(b)(1), unless the authorization is terminated  or revoked sooner.       Influenza A by PCR NEGATIVE NEGATIVE Final   Influenza B by PCR NEGATIVE NEGATIVE Final    Comment: (NOTE) The Xpert Xpress SARS-CoV-2/FLU/RSV plus assay is intended as an aid in the diagnosis of influenza from Nasopharyngeal swab specimens and should not be used as a sole basis for treatment. Nasal washings and aspirates are unacceptable for Xpert Xpress SARS-CoV-2/FLU/RSV testing.  Fact Sheet for Patients: BloggerCourse.com  Fact Sheet for Healthcare Providers: SeriousBroker.it  This test is not yet approved or cleared by the [place] FDA and has been authorized for detection and/or  diagnosis of SARS-CoV-2 by FDA under an Emergency Use Authorization (EUA). This EUA will remain in effect (meaning this test can be used) for the duration of the COVID-19 declaration under Section 564(b)(1) of the Act, 21 U.S.C. section 360bbb-3(b)(1), unless the authorization is terminated or revoked.  Performed at [facility], [address] [place], [place] [address]   MRSA PCR Screening     Status: None   Collection Time: 02/17/20  8:57 PM   Specimen: Nasopharyngeal  Result Value Ref Range Status   MRSA by PCR NEGATIVE NEGATIVE Final    Comment:        The GeneXpert MRSA Assay (FDA approved for NASAL specimens only), is one component of a comprehensive MRSA colonization surveillance program. It is not intended to diagnose MRSA infection nor to guide or monitor treatment for MRSA infections. Performed at [facility], [address] [place], [place] [address]          Radiology Studies: No results found.      Scheduled Meds: . colchicine  0.6 mg Oral BID  . febuxostat  40 mg Oral Daily  . furosemide  40 mg Intravenous Q12H   Continuous Infusions: . sodium chloride 50 mL (02/18/20 1359)  . heparin 3,000 Units/hr (02/19/20 0810)  . piperacillin-tazobactam (ZOSYN)  IV 3.375 g (02/19/20 0555)     LOS: 3 days    Time spent: 34 mins    Charise Killian, MD [facility] Pager [phone]  If 7PM-7AM, please contact night-coverage 02/19/2020, 8:58 AM

## 2020-02-19 NOTE — Consult Note (Signed)
Pharmacy Antibiotic Note  Ryan Gibson is a 44 y.o. male admitted on 02/16/2020 with Intra-abdominal Infection. Pt presented with shortness of breath. PMH includes pain and swelling in R leg x 1 month, HTN, and depression. Pt has known history of gallbladder disease. Pt being followed by IR and general surgery for evaluation of cholecystitis. Pharmacy has been consulted for Zosyn dosing.   WBC WNL, Scr < 1 stable, afebrile  12/22: abd [place]: Gallbladder wall edema with pericholecystic free fluid and positive sonographic Murphy's sign. 12/26 CT chest: Bilateral pulmonary emboli worst in the right lower lobe. No findings to suggest right heart strain are noted. 12/29: pt underwent gallbladder ultrasound for preparation of percutaneous cholecystostomy drainage and HIDA scan.  [place]: Gallbladder wall remains thickened with equivocal pericholecystic fluid. Patient is focally tender over the gallbladder. There is sludge in the gallbladder without demonstrable gallstones. This appearance is concerning for a degree of acalculus cholecystitis. It may be prudent to consider nuclear medicine hepatobiliary imaging study to assess for cystic duct patency.  Day 4 abx  Plan: Continue Zosyn 3.375 g (extended infusion) Q8H   Height: 6\' 2"  (188 cm) Weight: 121.8 kg (268 lb 8 oz) IBW/kg (Calculated) : 82.2  Temp (24hrs), Avg:98.6 F (37 C), Min:97.9 F (36.6 C), Max:100.1 F (37.8 C)  Recent Labs  Lab 02/16/20 0939 02/16/20 1117 02/17/20 0400 02/19/20 0605  WBC 11.0*  --  9.9 11.5*  CREATININE 1.00  --  0.92 1.07  LATICACIDVEN 2.6* 1.4  --   --     Estimated Creatinine Clearance: 122.1 mL/min (by C-G formula based on SCr of 1.07 mg/dL).    Allergies  Allergen Reactions  . No Known Allergies   . Vancomycin Hives    hi8vess post op on R arm -site of infusion, and whole body itching    Antimicrobials this admission: 12/26 Zosyn >>   Thank you for allowing pharmacy to be a part of this  patient's care.  1/27, PharmD Pharmacy Resident  02/19/2020 10:08 AM

## 2020-02-19 NOTE — Consult Note (Signed)
ANTICOAGULATION CONSULT NOTE   Pharmacy Consult for Heparin  Indication: pulmonary embolus  Patient Measurements: Heparin Dosing Weight: 104 kg   Labs: Recent Labs    02/16/20 0937 02/16/20 0939 02/16/20 1117 02/16/20 1835 02/17/20 0400 02/17/20 1100 02/18/20 0524 02/18/20 1503 02/18/20 1813 02/18/20 2109  HGB  --  15.5  --   --  14.6  --   --   --   --   --   HCT  --  47.6  --   --  45.2  --   --   --   --   --   PLT  --  273  --   --  229  --   --   --   --   --   APTT  --   --  31  --   --   --   --   --   --   --   LABPROT 17.0*  --   --   --   --   --   --   --   --   --   INR 1.4*  --   --   --   --   --   --   --   --   --   HEPARINUNFRC  --   --   --    < > 0.21*   < > 0.25* 0.19*  --  0.14*  CREATININE  --  1.00  --   --  0.92  --   --   --   --   --   TROPONINIHS  --  50*  --   --   --   --   --   --  38* 46*   < > = values in this interval not displayed.    Estimated Creatinine Clearance: 142 mL/min (by C-G formula based on SCr of 0.92 mg/dL).   Medical History: Past Medical History:  Diagnosis Date   Asthma    AS A CHILD ONLY   Foot pain, right    painful x 1 month and swelling    Medications:  Confirmed with patient no anticoagulants at this time.    Assessment: Patient is a 44 y/o M with medical history as above who presented to the ED 12/26 with shortness of breath and bilateral leg swelling. Subsequently found to have bilateral pulmonary emboli as well as non-occlusive thrombus in left common femoral vein. Pharmacy consulted to initiate heparin infusion for PE.  Baseline aPTT 31 seconds, INR 1.4. Baseline CBC within normal limits.  Heparin course: 12/26 1121: IV heparin 5000 unit bolus x 1 followed by continuous infusion at 1650 units/hr 12/26 1835 HL > 3.6, supratherapeutic. RN held drip at 2005 12/26 2045 HL 0.15 (re-check with drip off).  12/27 0400 HL 0.21, SUBtherapeutic,  3000 unit IV x 1 and increased  infusion to 1600 units/h 1227  1100 HL < 0.10, subtherapeutic  1227 1930 HL = 0.16 subtherapeutic  12/27 1932 HL 0.16, subtherapeutic 12/28 0524 HL 0.25, subtherapeutic 12/28 1503 HL 0.19, subtherapeutic 12/28 0311 HL 0.18, Subtherapeutic   Goal of Therapy:  Heparin level 0.3-0.7 units/ml Monitor platelets by anticoagulation protocol: Yes   Plan:  Heparin 3000 unit bolus followed by increase in heparin drip to 3000 units/hr. Recheck HL in 6 hours. Daily CBC per protocol while on heparin drip.  Otelia Sergeant, PharmD, [facility] 02/19/2020 1:48 AM

## 2020-02-19 NOTE — Consult Note (Signed)
ANTICOAGULATION CONSULT NOTE   Pharmacy Consult for Heparin  Indication: pulmonary embolus  Patient Measurements: Heparin Dosing Weight: 104 kg   Labs: Recent Labs    02/17/20 0400 02/17/20 1100 02/18/20 1503 02/18/20 1813 02/18/20 2109 02/19/20 0605 02/19/20 0914  HGB 14.6  --   --   --   --  14.7  --   HCT 45.2  --   --   --   --  43.5  --   PLT 229  --   --   --   --  204  --   HEPARINUNFRC 0.21*   < > 0.19*  --  0.14*  --  0.27*  CREATININE 0.92  --   --   --   --  1.07  --   TROPONINIHS  --   --   --  38* 46*  --   --    < > = values in this interval not displayed.    Estimated Creatinine Clearance: 122.1 mL/min (by C-G formula based on SCr of 1.07 mg/dL).   Medical History: Past Medical History:  Diagnosis Date  . Asthma    AS A CHILD ONLY  . Foot pain, right    painful x 1 month and swelling    Medications:  Confirmed with patient no anticoagulants at this time.    Assessment: Patient is a 44 y/o M with medical history as above who presented to the ED on 12/26 with shortness of breath and bilateral leg swelling. Subsequently found to have bilateral pulmonary emboli as well as non-occlusive thrombus in left common femoral vein. Pharmacy consulted to dose heparin infusion for PE.  12/26 CT chest: Bilateral pulmonary emboli worst in the right lower lobe. No findings to suggest right heart strain are noted. 12/26 LE [place]: Nonocclusive thrombus within LEFT common femoral vein 12/29: pt underwent gallbladder ultrasound for preparation of percutaneous cholecystostomy drainage and HIDA scan - unclear at this time if heparin gtt stopped for procedure per nursing - will follow up this afternoon.  Baseline aPTT 31 seconds, INR 1.4. H&H, platelets WNL.  Heparin course: 12/26 1121: IV heparin 5000 unit bolus x 1 followed by continuous infusion at 1650 units/hr 12/26 1835 HL > 3.6, supratherapeutic. RN held drip at 2005 12/26 2045 HL 0.15 (re-check with drip off).   12/27 0400 HL 0.21, SUBtherapeutic,  3000 unit IV x 1 and increased  infusion to 1600 units/h 1227 1100 HL < 0.10, subtherapeutic  1227 1930 HL = 0.16 subtherapeutic  12/27 1932 HL 0.16, subtherapeutic 12/28 0524 HL 0.25, subtherapeutic 12/28 1503 HL 0.19, subtherapeutic 12/28 0311 HL 0.18, Subtherapeutic 12/29 0914 HL 0.27, subtherapeutic  Goal of Therapy:  Heparin level 0.3-0.7 units/ml Monitor platelets by anticoagulation protocol: Yes   Plan:  Heparin 1650 unit bolus followed by increase in heparin drip to 3200 units/hr. Patient currently not on floor due to procedure - nursing will adjust rate and bolus once patient returns.  Recheck HL 6 hours after dose adjustment and bolus  Daily CBC per protocol while on heparin drip.  Reatha Armour, PharmD Pharmacy Resident  02/19/2020 1:06 PM

## 2020-02-19 NOTE — Progress Notes (Signed)
Ryan Gibson is a 44 y.o. male  [id number]  Primary Cardiologist: Adrian Blackwater Reason for Consultation: HFrEF  HPI: She is a 43 year old male without PMH presenting to the emergency department with right leg swelling as well as pain and nausea in his RUQ.  Patient has been found to have cholecystitis as well as bilateral pulmonary emboli.  Echocardiogram also revealed ejection fraction less than 20%, grade 1 diastolic dysfunction, global hypokinesis, moderate to severe MR, and moderate TR.  We have been consulted to help manage patient's new diagnosis of HFrEF and valvular abnormalities.   Review of Systems: Denies chest pain or shortness of breath.  Continues to have RUQ pain.   Past Medical History:  Diagnosis Date  . Asthma    AS A CHILD ONLY  . Foot pain, right    painful x 1 month and swelling    Medications Prior to Admission  Medication Sig Dispense Refill  . albuterol (VENTOLIN HFA) 108 (90 Base) MCG/ACT inhaler Inhale 1-2 puffs into the lungs every 6 (six) hours as needed for wheezing or shortness of breath. 18 g 1  . amoxicillin-clavulanate (AUGMENTIN) 875-125 MG tablet Take 1 tablet by mouth 2 (two) times daily. 20 tablet 0  . febuxostat (ULORIC) 40 MG tablet Take 40 mg by mouth daily.    . colchicine 0.6 MG tablet Take 1 tablet (0.6 mg total) by mouth 2 (two) times daily. 60 tablet 4  . diclofenac (VOLTAREN) 75 MG EC tablet TAKE 1 TABLET(75 MG) BY MOUTH TWICE DAILY 30 tablet 1     . colchicine  0.6 mg Oral BID  . febuxostat  40 mg Oral Daily  . furosemide  40 mg Intravenous Q12H  . metoprolol succinate  25 mg Oral Daily  . sacubitril-valsartan  1 tablet Oral BID    Infusions: . sodium chloride 50 mL (02/18/20 1359)  . heparin 3,000 Units/hr (02/19/20 0810)  . piperacillin-tazobactam (ZOSYN)  IV 3.375 g (02/19/20 0555)    Allergies  Allergen Reactions  . No Known Allergies   . Vancomycin Hives    hi8vess post op on R arm -site of infusion, and  whole body itching    Social History   Socioeconomic History  . Marital status: Married    Spouse name: Not on file  . Number of children: Not on file  . Years of education: Not on file  . Highest education level: Not on file  Occupational History  . Not on file  Tobacco Use  . Smoking status: Never Smoker  . Smokeless tobacco: Current User    Types: Chew  Vaping Use  . Vaping Use: Never used  Substance and Sexual Activity  . Alcohol use: Yes    Comment: Social  . Drug use: No  . Sexual activity: Yes    Birth control/protection: None    Comment: Married  Other Topics Concern  . Not on file  Social History Narrative   Married for 13 years.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   Social Determinants of Health   Financial Resource Strain: Not on file  Food Insecurity: Not on file  Transportation Needs: Not on file  Physical Activity: Not on file  Stress: Not on file  Social Connections: Not on file  Intimate Partner Violence: Not on file    Family History  Problem Relation Age of Onset  . Healthy Mother   . Heart disease Father     PHYSICAL EXAM: Vitals:   02/19/20  0328 02/19/20 0752  BP: 109/69 (!) 124/94  Pulse: 99 (!) 101  Resp: 18 19  Temp: 98 F (36.7 C)   SpO2: 100% 100%     Intake/Output Summary (Last 24 hours) at 02/19/2020 1105 Last data filed at 02/19/2020 0810 Gross per 24 hour  Intake 688.96 ml  Output 3900 ml  Net -3211.04 ml    General:  Well appearing. No respiratory difficulty HEENT: normal Neck: supple. no JVD. Carotids 2+ bilat; no bruits. No lymphadenopathy or thryomegaly appreciated. Cor: PMI nondisplaced.  ST. No rubs, gallops or murmurs. Lungs: clear Abdomen: Tenderness to palpation RUQ Extremities: no cyanosis, clubbing, rash, edema Neuro: alert & oriented x 3, cranial nerves grossly intact. moves all 4 extremities w/o difficulty. Affect pleasant.  ECG: ST with PVCs and nonspecific ST wave  changes.  109/BPM  Results for orders placed or performed during the hospital encounter of 02/16/20 (from the past 24 hour(s))  Heparin level (unfractionated)     Status: Abnormal   Collection Time: 02/18/20  3:03 PM  Result Value Ref Range   Heparin Unfractionated 0.19 (L) 0.30 - 0.70 IU/mL  Troponin I (High Sensitivity)     Status: Abnormal   Collection Time: 02/18/20  6:13 PM  Result Value Ref Range   Troponin I (High Sensitivity) 38 (H) <18 ng/L  Heparin level (unfractionated)     Status: Abnormal   Collection Time: 02/18/20  9:09 PM  Result Value Ref Range   Heparin Unfractionated 0.14 (L) 0.30 - 0.70 IU/mL  Troponin I (High Sensitivity)     Status: Abnormal   Collection Time: 02/18/20  9:09 PM  Result Value Ref Range   Troponin I (High Sensitivity) 46 (H) <18 ng/L  CBC with Differential/Platelet     Status: Abnormal   Collection Time: 02/19/20  6:05 AM  Result Value Ref Range   WBC 11.5 (H) 4.0 - 10.5 K/uL   RBC 4.73 4.22 - 5.81 MIL/uL   Hemoglobin 14.7 13.0 - 17.0 g/dL   HCT 85.2 77.8 - 24.2 %   MCV 92.0 80.0 - 100.0 fL   MCH 31.1 26.0 - 34.0 pg   MCHC 33.8 30.0 - 36.0 g/dL   RDW 35.3 (H) 61.4 - 43.1 %   Platelets 204 150 - 400 K/uL   nRBC 0.2 0.0 - 0.2 %   Neutrophils Relative % 61 %   Neutro Abs 7.1 1.7 - 7.7 K/uL   Lymphocytes Relative 28 %   Lymphs Abs 3.2 0.7 - 4.0 K/uL   Monocytes Relative 9 %   Monocytes Absolute 1.0 0.1 - 1.0 K/uL   Eosinophils Relative 1 %   Eosinophils Absolute 0.1 0.0 - 0.5 K/uL   Basophils Relative 1 %   Basophils Absolute 0.1 0.0 - 0.1 K/uL   Immature Granulocytes 0 %   Abs Immature Granulocytes 0.04 0.00 - 0.07 K/uL  Basic metabolic panel     Status: Abnormal   Collection Time: 02/19/20  6:05 AM  Result Value Ref Range   Sodium 134 (L) 135 - 145 mmol/L   Potassium 3.6 3.5 - 5.1 mmol/L   Chloride 100 98 - 111 mmol/L   CO2 23 22 - 32 mmol/L   Glucose, Bld 78 70 - 99 mg/dL   BUN 14 6 - 20 mg/dL   Creatinine, Ser 5.40 0.61 - 1.24  mg/dL   Calcium 8.1 (L) 8.9 - 10.3 mg/dL   GFR, Estimated >08 >67 mL/min   Anion gap 11 5 - 15  Magnesium  Status: Abnormal   Collection Time: 02/19/20  6:05 AM  Result Value Ref Range   Magnesium 1.6 (L) 1.7 - 2.4 mg/dL  Heparin level (unfractionated)     Status: Abnormal   Collection Time: 02/19/20  9:14 AM  Result Value Ref Range   Heparin Unfractionated 0.27 (L) 0.30 - 0.70 IU/mL   US Abdomen Limited RUQ (LIVER/GB)  Result Date: 02/19/2020 CLINICAL DATA:  Upper abdominal pain EXAM: ULTRASOUND ABDOMEN LIMITED RIGHT UPPER QUADRANT COMPARISON:  Ultrasound right upper quadrant February 12, 2020 FINDINGS: Gallbladder: There is sludge in the gallbladder. No gallstones are appreciable. The gallbladder wall remains thickened, and patient is focally tender over the gallbladder. There is equivocal pericholecystic fluid. No sonographic Murphy sign noted by sonographer. Common bile duct: Diameter: 3 mm. No intrahepatic or extrahepatic biliary duct dilatation. Liver: No focal lesion identified. Within normal limits in parenchymal echogenicity. Portal vein is patent on color Doppler imaging with normal direction of blood flow towards the liver. Other: None. IMPRESSION: Gallbladder wall remains thickened with equivocal pericholecystic fluid. Patient is focally tender over the gallbladder. There is sludge in the gallbladder without demonstrable gallstones. This appearance is concerning for a degree of acalculus cholecystitis. It may be prudent to consider nuclear medicine hepatobiliary imaging study to assess for cystic duct patency. Study otherwise unremarkable. These results will be called to the ordering clinician or representative by the Radiologist Assistant, and communication documented in the PACS or [employer]. Electronically Signed   By: Bretta Bang III M.D.   On: 02/19/2020 09:00     ASSESSMENT AND PLAN: 44 year old male presenting to the emergency department with right leg swelling  and RUQ abdominal pain.  Patient found to have cholecystitis, bilateral pulmonary emboli, and new onset HFrEF.  Surgery is following for cholecystitis.  Would recommend continuing heparin infusion regarding pulmonary emboli with transition to DOAC prior to discharge.  Regarding new diagnosis of HFrEF patient will be started on metoprolol succinate and Entresto.  Further investigation can be done as an outpatient as patient is a young healthy male with extensive abnormalities.  We will continue to follow  Maryelizabeth Kaufmann NP-C

## 2020-02-19 NOTE — Consult Note (Signed)
Chief Complaint: Patient was seen in consultation today for right upper quadrant pain, possible cholecystitis  Referring Physician(s): Carolan Shiver, MD  Patient Status: [facility] - In-pt  History of Present Illness: Ryan Gibson is a 44 y.o. male admitted for 2 months of shortness of breath and abdominal pain, which acutely worsened prior to admission on 02/16/20, at which time he was noted to have bilateral subsegmental PE (intermediate-low risk) and imaging findings equivocal for acute cholecystitis, started on heparin infusion.  He complains of 2 months of right upper quadrant pain which has been constant.  He cannot think of any exacerbating factors besides movement.  He has been immobile during this time due to his right knee, for which he has planned surgery.  Approximately 1 month ago he developed a sinusitis and cough with accompanying shortness of breath which has persisted since that time.  He has been on Zosyn since admission.  No fevers or chills.  Denies nausea and vomiting.  Past Medical History:  Diagnosis Date  . Asthma    AS A CHILD ONLY  . Foot pain, right    painful x 1 month and swelling    Past Surgical History:  Procedure Laterality Date  . HERNIA REPAIR  07/13/2011   RIH  . INGUINAL HERNIA REPAIR  07/13/2011   Procedure: HERNIA REPAIR INGUINAL ADULT;  Surgeon: Wilmon Arms. Corliss Skains, MD;  Location: [facility];  Service: General;  Laterality: Right;  . NO PREVIOUS SURGERY      Allergies: No known allergies and Vancomycin  Medications: Prior to Admission medications   Medication Sig Start Date End Date Taking? Authorizing Provider  albuterol (VENTOLIN HFA) 108 (90 Base) MCG/ACT inhaler Inhale 1-2 puffs into the lungs every 6 (six) hours as needed for wheezing or shortness of breath. 02/07/20  Yes Avegno, Zachery Dakins, FNP  amoxicillin-clavulanate (AUGMENTIN) 875-125 MG tablet Take 1 tablet by mouth 2 (two) times daily. 02/13/20  Yes Elenore Paddy, NP   febuxostat (ULORIC) 40 MG tablet Take 40 mg by mouth daily. 01/02/20  Yes [provider]  colchicine 0.6 MG tablet Take 1 tablet (0.6 mg total) by mouth 2 (two) times daily. 06/24/19   Merlyn Albert, MD  diclofenac (VOLTAREN) 75 MG EC tablet TAKE 1 TABLET(75 MG) BY MOUTH TWICE DAILY 01/11/20   Annalee Genta, DO     Family History  Problem Relation Age of Onset  . Healthy Mother   . Heart disease Father     Social History   Socioeconomic History  . Marital status: Married    Spouse name: Not on file  . Number of children: Not on file  . Years of education: Not on file  . Highest education level: Not on file  Occupational History  . Not on file  Tobacco Use  . Smoking status: Never Smoker  . Smokeless tobacco: Current User    Types: Chew  Vaping Use  . Vaping Use: Never used  Substance and Sexual Activity  . Alcohol use: Yes    Comment: Social  . Drug use: No  . Sexual activity: Yes    Birth control/protection: None    Comment: Married  Other Topics Concern  . Not on file  Social History Narrative   Married for 13 years.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   Social Determinants of Health   Financial Resource Strain: Not on file  Food Insecurity: Not on file  Transportation Needs: Not on file  Physical Activity:  Not on file  Stress: Not on file  Social Connections: Not on file    Review of Systems: A 12 point ROS discussed and pertinent positives are indicated in the HPI above.  All other systems are negative.  Vital Signs: BP 119/76 (BP Location: Right Arm)   Pulse 98   Temp 97.7 F (36.5 C) (Oral)   Resp 19   Ht 6\' 2"  (1.88 m)   Wt 121.8 kg   SpO2 96%   BMI 34.47 kg/m   Physical Exam Constitutional:      Appearance: Normal appearance. He is not toxic-appearing.  HENT:     Head: Normocephalic.     Mouth/Throat:     Mouth: Mucous membranes are moist.  Eyes:     General: No scleral  icterus. Cardiovascular:     Rate and Rhythm: Normal rate and regular rhythm.  Pulmonary:     Comments: Tachypnea  Abdominal:     Tenderness: There is abdominal tenderness. There is guarding.     Comments: + Murphy's sign  Skin:    General: Skin is warm and dry.  Neurological:     Mental Status: He is alert and oriented to person, place, and time.     Imaging: DG Chest 2 View  Result Date: 02/07/2020 CLINICAL DATA:  Cough and short of breath 1 month EXAM: CHEST - 2 VIEW COMPARISON:  07/05/2011 FINDINGS: The heart size and mediastinal contours are within normal limits. Both lungs are clear. The visualized skeletal structures are unremarkable. IMPRESSION: No active cardiopulmonary disease. Electronically Signed   By: Marlan Palau M.D.   On: 02/07/2020 11:46   CT Angio Chest PE W and/or Wo Contrast  Result Date: 02/16/2020 CLINICAL DATA:  Shortness of breath and bilateral leg swelling EXAM: CT ANGIOGRAPHY CHEST WITH CONTRAST TECHNIQUE: Multidetector CT imaging of the chest was performed using the standard protocol during bolus administration of intravenous contrast. Multiplanar CT image reconstructions and MIPs were obtained to evaluate the vascular anatomy. CONTRAST:  75mL OMNIPAQUE IOHEXOL 350 MG/ML SOLN COMPARISON:  Chest x-ray from earlier in the same day. FINDINGS: Cardiovascular: Thoracic aorta shows no aneurysmal dilatation. The degree of opacification is limited precluding evaluation for dissection. Coronary calcifications are noted. Pulmonary artery is well visualized with bilateral pulmonary artery slightly greater in the right lower lobe. No right heart strain is seen. Heart is enlarged in size with prominence of the left ventricle. Mediastinum/Nodes: Thoracic inlet is within normal limits. No sizable hilar or mediastinal adenopathy is noted. The esophagus as visualized is within normal limits. Lungs/Pleura: Small right-sided pleural effusion is noted. Patchy peripheral infiltrates  are noted bilaterally likely related to the underlying pulmonary emboli as they are worst in the right lower lobe. These changes may represent early findings of pulmonary infarct. No sizable parenchymal nodule is seen. No other focal abnormality is noted. Upper Abdomen: Visualized upper abdomen demonstrates minimal free fluid. This is stable from a prior CT from 02/11/2020 Musculoskeletal: No chest wall abnormality. No acute or significant osseous findings. Review of the MIP images confirms the above findings. IMPRESSION: Bilateral pulmonary emboli worst in the right lower lobe. No findings to suggest right heart strain are noted. Bilateral patchy infiltrates likely related to the underlying emboli and may represent early sequelae of pulmonary infarct. Critical Value/emergent results were called by telephone at the time of interpretation on 02/16/2020 at 10:49 am to Dr. Willy Eddy , who verbally acknowledged these results. Electronically Signed   By: Eulah Pont.D.  On: 02/16/2020 10:53   NM Hepatobiliary Liver Func  Result Date: 02/19/2020 CLINICAL DATA:  44 year old male with history of 2 months of abdominal pain, worse in the right upper quadrant over the past 2 days with associated nausea and vomiting. Currently hospitalized for bilateral pulmonary emboli. EXAM: NUCLEAR MEDICINE HEPATOBILIARY IMAGING TECHNIQUE: Sequential images of the abdomen were obtained out to 60 minutes following intravenous administration of radiopharmaceutical. RADIOPHARMACEUTICALS:  5.13 mCi Tc-53m  Choletec IV COMPARISON:  CT abdomen pelvis from 02/11/2020, ultrasound abdomen from 02/12/2020 and 02/19/2020 FINDINGS: Prompt uptake and biliary excretion of activity by the liver is seen. Gallbladder activity is not visualized after 60 minutes, however after administration of 3 mg morphine, radiotracer uptake within the gallbladder is immediately visualized, consistent with patency of cystic duct. Biliary activity passes into  small bowel, consistent with patent common bile duct. IMPRESSION: Patent cystic duct, no evidence of acute cholecystitis. Marliss Coots, MD [facility] Electronically Signed   By: Marliss Coots MD   On: 02/19/2020 13:50   CT Abdomen Pelvis W Contrast  Result Date: 02/11/2020 CLINICAL DATA:  Hepatomegaly, technologist note states mid to right-sided abdominal pain for several months EXAM: CT ABDOMEN AND PELVIS WITH CONTRAST TECHNIQUE: Multidetector CT imaging of the abdomen and pelvis was performed using the standard protocol following bolus administration of intravenous contrast. CONTRAST:  OMNIPAQUE IOHEXOL 300 MG/ML  SOLN COMPARISON:  None. FINDINGS: Lower chest: Subsegmental atelectasis. Hepatobiliary: No focal liver lesion. There is gallbladder thickening versus pericholecystic fluid. No gallstone. No significant gallbladder distension. Common bile duct is normal in caliber. Pancreas: Unremarkable. Spleen: Unremarkable. Adrenals/Urinary Tract: Cyst of the lower pole the left kidney. Adrenals are unremarkable. Bladder is poorly distended likely accounting for mild wall thickening. Stomach/Bowel: Stomach is within normal limits. Bowel is normal in caliber. Few colonic diverticula. Vascular/Lymphatic: No significant vascular findings. No enlarged lymph nodes. Reproductive: Unremarkable. Other: Small volume abdominopelvic free fluid. No acute abnormality of the abdominal wall. Musculoskeletal: No significant or acute osseous abnormality. IMPRESSION: Nonspecific gallbladder wall thickening versus pericholecystic fluid. No additional findings to suggest acute cholecystitis. Right upper quadrant ultrasound is recommended for further evaluation. Small volume abdominopelvic ascites. Minor colonic diverticulosis. These results will be called to the ordering clinician or representative by the Radiology Department at the imaging location. Electronically  Signed   By: Guadlupe Spanish M.D.   On: 02/11/2020 15:03   US Venous Img Lower Bilateral (DVT)  Result Date: 02/16/2020 CLINICAL DATA:  BILATERAL leg pain, pulmonary emboli on CT angio chest EXAM: BILATERAL LOWER EXTREMITY VENOUS DOPPLER ULTRASOUND TECHNIQUE: Gray-scale sonography with compression, as well as color and duplex ultrasound, were performed to evaluate the deep venous system(s) from the level of the common femoral vein through the popliteal and proximal calf veins. COMPARISON:  None FINDINGS: VENOUS RIGHT lower extremity: Normal compressibility of the common femoral, superficial femoral, and popliteal veins, as well as the visualized calf veins. Visualized portions of profunda femoral vein and great saphenous vein unremarkable. No filling defects to suggest DVT on grayscale or color Doppler imaging. Doppler waveforms show normal direction of venous flow, normal respiratory plasticity and response to augmentation. LEFT lower extremity: Small amount of nonocclusive thrombus identified within LEFT common femoral vein, with impaired compressibility. This extends to involve the saphenofemoral junction. Remaining deep venous system in the LEFT lower extremity is patent and compressible. Remaining veins demonstrate spontaneous venous flow. Greater saphenous vein patent. OTHER N/A Limitations: None IMPRESSION: Nonocclusive thrombus within LEFT common femoral  vein. Remaining deep venous system of the LEFT lower extremity is normal. No evidence of deep venous thrombosis in the RIGHT lower extremity. Electronically Signed   By: Ulyses Southward M.D.   On: 02/16/2020 14:48   DG Chest Port 1 View  Result Date: 02/16/2020 CLINICAL DATA:  Presented to hospital with abdominal pain, abnormal CT and ultrasound exams with findings suggesting acute cholecystitis. Suspected sepsis EXAM: PORTABLE CHEST 1 VIEW COMPARISON:  Portable exam 0945 hours compared to 02/07/2020 FINDINGS: Enlargement of cardiac silhouette.  Mediastinal contours and pulmonary vascularity normal. Lungs clear. No infiltrate, pleural effusion or pneumothorax. Levoconvex upper thoracic scoliosis. IMPRESSION: Enlargement of cardiac silhouette without acute infiltrate. Electronically Signed   By: Ulyses Southward M.D.   On: 02/16/2020 10:00   ECHOCARDIOGRAM COMPLETE  Result Date: 02/17/2020    ECHOCARDIOGRAM REPORT   Patient Name:   QUASIR CAPEHART Date of Exam: 02/16/2020 Medical Rec #:  [id number]           Height:       73.0 in Accession #:    [id number]          Weight:       245.0 lb Date of Birth:  08/10/1975          BSA:          2.345 m Patient Age:    44 years            BP:           112/96 mmHg Patient Gender: M                   HR:           104 bpm. Exam Location:  [facility] Procedure: 2D Echo, Cardiac Doppler and Color Doppler Indications:     Pulmonary Embolus 415.19 / I26.99  History:         Patient has no prior history of Echocardiogram examinations.  Sonographer:     Neysa Bonito Roar Referring Phys:  [id number] AGBATA Diagnosing Phys: Arnoldo Hooker MD IMPRESSIONS  1. Left ventricular ejection fraction, by estimation, is <20%. The left ventricle has severely decreased function. The left ventricle demonstrates global hypokinesis. The left ventricular internal cavity size was severely dilated. There is mild left ventricular hypertrophy. Left ventricular diastolic parameters are consistent with Grade I diastolic dysfunction (impaired relaxation).  2. Right ventricular systolic function is low normal. The right ventricular size is mildly enlarged.  3. The mitral valve is normal in structure. Moderate to severe mitral valve regurgitation.  4. Tricuspid valve regurgitation is moderate.  5. The aortic valve is normal in structure. Aortic valve regurgitation is not visualized. FINDINGS  Left Ventricle: Left ventricular ejection fraction, by estimation, is <20%. The left ventricle has severely decreased function. The left ventricle demonstrates  global hypokinesis. The left ventricular internal cavity size was severely dilated. There is mild left ventricular hypertrophy. Left ventricular diastolic parameters are consistent with Grade I diastolic dysfunction (impaired relaxation). Right Ventricle: The right ventricular size is mildly enlarged. No increase in right ventricular wall thickness. Right ventricular systolic function is low normal. Left Atrium: Left atrial size was normal in size. Right Atrium: Right atrial size was normal in size. Pericardium: There is no evidence of pericardial effusion. Mitral Valve: The mitral valve is normal in structure. Moderate to severe mitral valve regurgitation. Tricuspid Valve: The tricuspid valve is normal in structure. Tricuspid valve regurgitation is moderate. Aortic Valve: The aortic valve is normal in structure. Aortic  valve regurgitation is not visualized. Aortic valve peak gradient measures 4.8 mmHg. Pulmonic Valve: The pulmonic valve was normal in structure. Pulmonic valve regurgitation is trivial. Aorta: The aortic root and ascending aorta are structurally normal, with no evidence of dilitation. IAS/Shunts: No atrial level shunt detected by color flow Doppler.  LEFT VENTRICLE PLAX 2D LVIDd:         7.56 cm      Diastology LVIDs:         6.89 cm      LV e' medial:    3.23 cm/s LV PW:         0.98 cm      LV E/e' medial:  29.0 LV IVS:        0.98 cm      LV e' lateral:   6.60 cm/s LVOT diam:     2.00 cm      LV E/e' lateral: 14.2 LVOT Area:     3.14 cm  LV Volumes (MOD) LV vol d, MOD A2C: 242.0 ml LV vol d, MOD A4C: 258.0 ml LV vol s, MOD A2C: 197.0 ml LV vol s, MOD A4C: 207.0 ml LV SV MOD A2C:     45.0 ml LV SV MOD A4C:     258.0 ml LV SV MOD BP:      42.9 ml RIGHT VENTRICLE RV Mid diam:    3.22 cm RV S prime:     7.90 cm/s TAPSE (M-mode): 1.6 cm LEFT ATRIUM             Index       RIGHT ATRIUM           Index LA diam:        3.90 cm 1.66 cm/m  RA Area:     24.70 cm LA Vol (A2C):   89.4 ml 38.12 ml/m RA  Volume:   76.30 ml  32.53 ml/m LA Vol (A4C):   57.3 ml 24.43 ml/m LA Biplane Vol: 75.5 ml 32.19 ml/m  AORTIC VALVE                PULMONIC VALVE AV Area (Vmax): 1.83 cm    PV Vmax:        0.56 m/s AV Vmax:        110.00 cm/s PV Peak grad:   1.2 mmHg AV Peak Grad:   4.8 mmHg    RVOT Peak grad: 0 mmHg LVOT Vmax:      64.00 cm/s  MITRAL VALVE               TRICUSPID VALVE MV Area (PHT): 6.83 cm    TR Peak grad:   28.5 mmHg MV Decel Time: 111 msec    TR Vmax:        267.00 cm/s MV E velocity: 93.80 cm/s                            SHUNTS                            Systemic Diam: 2.00 cm Arnoldo Hooker MD Electronically signed by Arnoldo Hooker MD Signature Date/Time: 02/17/2020/6:00:24 AM    Final    US Abdomen Limited RUQ (LIVER/GB)  Result Date: 02/19/2020 CLINICAL DATA:  Upper abdominal pain EXAM: ULTRASOUND ABDOMEN LIMITED RIGHT UPPER QUADRANT COMPARISON:  Ultrasound right upper quadrant February 12, 2020 FINDINGS: Gallbladder: There is sludge in the  gallbladder. No gallstones are appreciable. The gallbladder wall remains thickened, and patient is focally tender over the gallbladder. There is equivocal pericholecystic fluid. No sonographic Murphy sign noted by sonographer. Common bile duct: Diameter: 3 mm. No intrahepatic or extrahepatic biliary duct dilatation. Liver: No focal lesion identified. Within normal limits in parenchymal echogenicity. Portal vein is patent on color Doppler imaging with normal direction of blood flow towards the liver. Other: None. IMPRESSION: Gallbladder wall remains thickened with equivocal pericholecystic fluid. Patient is focally tender over the gallbladder. There is sludge in the gallbladder without demonstrable gallstones. This appearance is concerning for a degree of acalculus cholecystitis. It may be prudent to consider nuclear medicine hepatobiliary imaging study to assess for cystic duct patency. Study otherwise unremarkable. These results will be called to the ordering  clinician or representative by the Radiologist Assistant, and communication documented in the PACS or [employer]. Electronically Signed   By: Bretta Bang III M.D.   On: 02/19/2020 09:00   US Abdomen Limited RUQ (LIVER/GB)  Result Date: 02/12/2020 CLINICAL DATA:  Gallbladder abnormality, right upper quadrant pain. EXAM: ULTRASOUND ABDOMEN LIMITED RIGHT UPPER QUADRANT COMPARISON:  02/11/2020 CT. FINDINGS: Gallbladder: No gallstones visualized. Diffuse gallbladder wall thickening/edema measuring up to 1.3 cm. Trace pericholecystic free fluid. Sonographic Murphy sign was positive. Common bile duct: Diameter: 6 mm Liver: No focal lesion identified. Increased parenchymal echogenicity. Portal vein is patent on color Doppler imaging with normal direction of blood flow towards the liver. Other: None. IMPRESSION: Gallbladder wall edema with pericholecystic free fluid and positive sonographic Murphy's sign. No gallstones or biliary dilatation visualized. Hepatic steatosis.  No focal hepatic lesion. Electronically Signed   By: Stana Bunting M.D.   On: 02/12/2020 12:54    Labs:  CBC: Recent Labs    02/10/20 1525 02/16/20 0939 02/17/20 0400 02/19/20 0605  WBC 12.6* 11.0* 9.9 11.5*  HGB 15.0 15.5 14.6 14.7  HCT 45.3 47.6 45.2 43.5  PLT 396 273 229 204    COAGS: Recent Labs    02/10/20 1525 02/16/20 0937 02/16/20 1117  INR 1.3* 1.4*  --   APTT  --   --  31    BMP: Recent Labs    11/20/19 1528 02/10/20 1525 02/16/20 0939 02/17/20 0400 02/19/20 0605  NA 139 139 133* 133* 134*  K 4.8 4.6 4.1 4.5 3.6  CL 102 103 98 103 100  CO2 25 25 24 23 23   GLUCOSE 115* 142* 130* 99 78  BUN 15 20 11 12 14   CALCIUM 9.2 9.1 8.7* 8.2* 8.1*  CREATININE 1.03 1.08 1.00 0.92 1.07  GFRNONAA 89 83 >60 >60 >60  GFRAA 102 96  --   --   --     LIVER FUNCTION TESTS: Recent Labs    11/20/19 1528 02/10/20 1525 02/16/20 0939  BILITOT 0.9 0.8 1.7*  AST 46* 40 36  ALT 81* 70* 77*  ALKPHOS  56  --  62  PROT 7.1 6.2 6.5  ALBUMIN 4.7  --  3.7    TUMOR MARKERS: No results for input(s): AFPTM, CEA, CA199, CHROMGRNA in the last 8760 hours.  Assessment and Plan: 44 year old male admitted with likely subacute, intermediate-low risk pulmonary emboli and right upper quadrant abdominal pain.  Initially equivocal imaging findings for cholecystitis, with HIDA scan today demonstrating patent cystic duct.    No indication for cholecystostomy tube in the setting of cystic duct patency and non-dilated gallbladder.  It is certainly plausible he has chronic cholecystitis, for which  a cholecystostomy tube would provide no benefit.    His exquisite RUQ pain is likely multifactorial.  PE, particularly given small right pleural effusion, could certainly result in pleuritic right chest pain.  If he has chronic cholecystitis, he certainly has a good reason for RUQ pain.    Thank you for this interesting consult.  I greatly enjoyed meeting Salif Servellon and look forward to participating in their care.  A copy of this report was sent to the requesting provider on this date.  Electronically Signed: Bennie Dallas, MD 02/19/2020, 2:31 PM   I spent a total of 20 Minutes in face to face in clinical consultation, greater than 50% of which was counseling/coordinating care for right upper quadrant pain.

## 2020-02-20 DIAGNOSIS — K7200 Acute and subacute hepatic failure without coma: Secondary | ICD-10-CM | POA: Diagnosis not present

## 2020-02-20 DIAGNOSIS — K810 Acute cholecystitis: Secondary | ICD-10-CM | POA: Diagnosis not present

## 2020-02-20 DIAGNOSIS — I2699 Other pulmonary embolism without acute cor pulmonale: Secondary | ICD-10-CM | POA: Diagnosis not present

## 2020-02-20 LAB — COMPREHENSIVE METABOLIC PANEL
ALT: 1484 U/L — ABNORMAL HIGH (ref 0–44)
AST: 1172 U/L — ABNORMAL HIGH (ref 15–41)
Albumin: 3 g/dL — ABNORMAL LOW (ref 3.5–5.0)
Alkaline Phosphatase: 55 U/L (ref 38–126)
Anion gap: 11 (ref 5–15)
BUN: 11 mg/dL (ref 6–20)
CO2: 26 mmol/L (ref 22–32)
Calcium: 7.6 mg/dL — ABNORMAL LOW (ref 8.9–10.3)
Chloride: 96 mmol/L — ABNORMAL LOW (ref 98–111)
Creatinine, Ser: 0.94 mg/dL (ref 0.61–1.24)
GFR, Estimated: >60 mL/min (ref >60)
Glucose, Bld: 94 mg/dL (ref 70–99)
Potassium: 3.3 mmol/L — ABNORMAL LOW (ref 3.5–5.1)
Sodium: 133 mmol/L — ABNORMAL LOW (ref 135–145)
Total Bilirubin: 2.3 mg/dL — ABNORMAL HIGH (ref 0.3–1.2)
Total Protein: 5.5 g/dL — ABNORMAL LOW (ref 6.5–8.1)

## 2020-02-20 LAB — HEPATITIS PANEL, ACUTE
HCV Ab: NONREACTIVE
Hep A IgM: NONREACTIVE
Hep B C IgM: NONREACTIVE
Hepatitis B Surface Ag: NONREACTIVE

## 2020-02-20 LAB — CBC
HCT: 44.7 % (ref 39.0–52.0)
Hemoglobin: 15.1 g/dL (ref 13.0–17.0)
MCH: 30.6 pg (ref 26.0–34.0)
MCHC: 33.8 g/dL (ref 30.0–36.0)
MCV: 90.7 fL (ref 80.0–100.0)
Platelets: 208 10*3/uL (ref 150–400)
RBC: 4.93 MIL/uL (ref 4.22–5.81)
RDW: 15.8 % — ABNORMAL HIGH (ref 11.5–15.5)
WBC: 9.4 10*3/uL (ref 4.0–10.5)
nRBC: 0.6 % — ABNORMAL HIGH (ref 0.0–0.2)

## 2020-02-20 LAB — HEPARIN LEVEL (UNFRACTIONATED)
Heparin Unfractionated: 0.62 IU/mL (ref 0.30–0.70)
Heparin Unfractionated: 0.56 IU/mL (ref 0.30–0.70)

## 2020-02-20 MED ORDER — POTASSIUM CHLORIDE CRYS ER 20 MEQ PO TBCR
40.0000 meq | EXTENDED_RELEASE_TABLET | Freq: Once | ORAL | Status: AC
  Administered 2020-02-20: 40 meq via ORAL
  Filled 2020-02-20: qty 2

## 2020-02-20 NOTE — Consult Note (Signed)
ANTICOAGULATION CONSULT NOTE   Pharmacy Consult for Heparin  Indication: pulmonary embolus  Patient Measurements: Heparin Dosing Weight: 104 kg   Labs: Recent Labs    02/18/20 1813 02/18/20 2109 02/19/20 0605 02/19/20 0914 02/19/20 1942 02/20/20 0256  HGB  --   --  14.7  --   --  15.1  HCT  --   --  43.5  --   --  44.7  PLT  --   --  204  --   --  208  HEPARINUNFRC  --  0.14*  --  0.27* 0.12* 0.62  CREATININE  --   --  1.07  --   --  0.94  TROPONINIHS 38* 46*  --   --   --   --     Estimated Creatinine Clearance: 135.6 mL/min (by C-G formula based on SCr of 0.94 mg/dL).   Medical History: Past Medical History:  Diagnosis Date  . Asthma    AS A CHILD ONLY  . Foot pain, right    painful x 1 month and swelling    Medications:  Confirmed with patient no anticoagulants at this time.    Assessment: Patient is a 44 y/o M with medical history as above who presented to the ED on 12/26 with shortness of breath and bilateral leg swelling. Subsequently found to have bilateral pulmonary emboli as well as non-occlusive thrombus in left common femoral vein. Pharmacy consulted to dose heparin infusion for PE.  12/26 CT chest: Bilateral pulmonary emboli worst in the right lower lobe. No findings to suggest right heart strain are noted. 12/26 LE [place]: Nonocclusive thrombus within LEFT common femoral vein 12/29: pt underwent gallbladder ultrasound for preparation of percutaneous cholecystostomy drainage and HIDA scan - unclear at this time if heparin gtt stopped for procedure per nursing - will follow up this afternoon.  Baseline aPTT 31 seconds, INR 1.4. H&H, platelets WNL.  Heparin course: 12/26 1121: IV heparin 5000 unit bolus x 1 followed by continuous infusion at 1650 units/hr 12/26 1835 HL > 3.6, supratherapeutic. RN held drip at 2005 12/26 2045 HL 0.15 (re-check with drip off).  12/27 0400 HL 0.21, SUBtherapeutic,  3000 unit IV x 1 and increased  infusion to 1600 units/h 1227  1100 HL < 0.10, subtherapeutic  1227 1930 HL = 0.16 subtherapeutic  12/27 1932 HL 0.16, subtherapeutic 12/28 0524 HL 0.25, subtherapeutic 12/28 1503 HL 0.19, subtherapeutic 12/28 0311 HL 0.18, subtherapeutic 12/29 0914 HL 0.27, subtherapeutic 12/29 1942 HL 0.12; subtherapeutic (no interruption per RN since 1400 charted) 12/30 0256 HL 0.62, therapeutic  Goal of Therapy:  Heparin level 0.3-0.7 units/ml Monitor platelets by anticoagulation protocol: Yes   Plan:  Continue with heparin drip rate at 3550 units/hr. Recheck HL 6 hours to confirm. Daily CBC per protocol while on heparin drip.  Otelia Sergeant, PharmD, [facility] 02/20/2020 5:08 AM

## 2020-02-20 NOTE — Progress Notes (Signed)
PROGRESS NOTE    Ryan Gibson  MVH:[id number] DOB: 10-Mar-1975 DOA: 02/16/2020 PCP: Wilson Singer, MD    Assessment & Plan:   Principal Problem:   Pulmonary embolism (HCC) Active Problems:   Essential hypertension   Depression, major, single episode, moderate (HCC)   Acute acalculous cholecystitis   Bilateral pulmonary emboli: continue on IV heparin drip and will switch po eliquis prior to d/c. Encourage incentive spirometry   Leucocytosis: resolved  Acute hypoxic respiratory failure: secondary to b/l PE. Resolved   Acute acalculous cholecystitis: hold off on cholecystectomy drain as per general surg & IR. HIDA shows patent cystic duct, no evidence of acute cholecystitis. Continue on IV zosyn  Acute hepatic failure: severe, etiology unclear. Hepatitis panel ordered. Will hold home dose of febuxostat, colchicine as it can cause hepatotoxicity. Pt denies alcohol abuse, taking tylenol daily or illicit drug use   Hypokalemia: KCl repleted. Will continue to monitor   HTN: resolved. No on any anti-HTN meds at home   Hyponatremia: labile. Will continue to monitor   Hypomagnesemia: will continue to monitor    DVT prophylaxis: heparin  Code Status: full  Family Communication:  Disposition Plan:  Likely d/c back home  Status is: Inpatient  Remains inpatient appropriate because:Ongoing active pain requiring inpatient pain management, Ongoing diagnostic testing needed not appropriate for outpatient work up and IV treatments appropriate due to intensity of illness or inability to take PO   Dispo: The patient is from: Home              Anticipated d/c is to: Home              Anticipated d/c date is: 3 days              Patient currently is not medically stable to d/c.      Consultants:   General surg   Procedures:    Antimicrobials:    Subjective: Pt c/o RUQ pain but improved from day prior.   Objective: Vitals:   02/19/20 1612 02/19/20 1939  02/20/20 0314 02/20/20 0459  BP: (!) 125/96 102/87 96/84   Pulse: 98 89 87   Resp: 20 18 16    Temp: 97.8 F (36.6 C) 98.4 F (36.9 C) 98.2 F (36.8 C)   TempSrc: Oral Oral Oral   SpO2: 95% 94% 92%   Weight:    115.7 kg  Height:        Intake/Output Summary (Last 24 hours) at 02/20/2020 0809 Last data filed at 02/20/2020 8413 Gross per 24 hour  Intake 1511.29 ml  Output 3550 ml  Net -2038.71 ml   Filed Weights   02/17/20 2040 02/18/20 0553 02/20/20 0459  Weight: 121.8 kg 121.8 kg 115.7 kg    Examination:  General exam: Appears calm & comfortable  Respiratory system: decreased breath sounds b/l. No wheezes. Cardiovascular system: S1 & S2 +. No rubs, gallops or clicks.  Gastrointestinal system: Abd is soft, tenderness to palpation, non-distended & hypoactive bowel sounds  Central nervous system: Alert and oriented. Moves all 4 extremities Psychiatry: Judgement and insight appear normal. Flat mood and affect     Data Reviewed: I have personally reviewed following labs and imaging studies  CBC: Recent Labs  Lab 02/16/20 0939 02/17/20 0400 02/19/20 0605 02/20/20 0256  WBC 11.0* 9.9 11.5* 9.4  NEUTROABS 7.4  --  7.1  --   HGB 15.5 14.6 14.7 15.1  HCT 47.6 45.2 43.5 44.7  MCV 93.9 94.8 92.0 90.7  PLT  273 229 204 208   Basic Metabolic Panel: Recent Labs  Lab 02/16/20 0939 02/17/20 0400 02/19/20 0605 02/20/20 0256  NA 133* 133* 134* 133*  K 4.1 4.5 3.6 3.3*  CL 98 103 100 96*  CO2 24 23 23 26   GLUCOSE 130* 99 78 94  BUN 11 12 14 11   CREATININE 1.00 0.92 1.07 0.94  CALCIUM 8.7* 8.2* 8.1* 7.6*  MG  --   --  1.6*  --    GFR: Estimated Creatinine Clearance: 135.6 mL/min (by C-G formula based on SCr of 0.94 mg/dL). Liver Function Tests: Recent Labs  Lab 02/16/20 0939 02/20/20 0256  AST 36 1,172*  ALT 77* 1,484*  ALKPHOS 62 55  BILITOT 1.7* 2.3*  PROT 6.5 5.5*  ALBUMIN 3.7 3.0*   Recent Labs  Lab 02/16/20 0939  LIPASE 27   No results for  input(s): AMMONIA in the last 168 hours. Coagulation Profile: Recent Labs  Lab 02/16/20 0937  INR 1.4*   Cardiac Enzymes: No results for input(s): CKTOTAL, CKMB, CKMBINDEX, TROPONINI in the last 168 hours. BNP (last 3 results) No results for input(s): PROBNP in the last 8760 hours. HbA1C: No results for input(s): HGBA1C in the last 72 hours. CBG: No results for input(s): GLUCAP in the last 168 hours. Lipid Profile: No results for input(s): CHOL, HDL, LDLCALC, TRIG, CHOLHDL, LDLDIRECT in the last 72 hours. Thyroid Function Tests: No results for input(s): TSH, T4TOTAL, FREET4, T3FREE, THYROIDAB in the last 72 hours. Anemia Panel: No results for input(s): VITAMINB12, FOLATE, FERRITIN, TIBC, IRON, RETICCTPCT in the last 72 hours. Sepsis Labs: Recent Labs  Lab 02/16/20 7829 02/16/20 1117  LATICACIDVEN 2.6* 1.4    Recent Results (from the past 240 hour(s))  Culture, blood (Routine x 2)     Status: None (Preliminary result)   Collection Time: 02/16/20  9:39 AM   Specimen: BLOOD  Result Value Ref Range Status   Specimen Description BLOOD LEFT HAND  Final   Special Requests   Final    BOTTLES DRAWN AEROBIC AND ANAEROBIC Blood Culture adequate volume   Culture   Final    NO GROWTH 4 DAYS Performed at [facility], [address], [place], [place] [address]    Report Status PENDING  Incomplete  Culture, blood (Routine x 2)     Status: None (Preliminary result)   Collection Time: 02/16/20  9:39 AM   Specimen: BLOOD  Result Value Ref Range Status   Specimen Description BLOOD LEFT AC  Final   Special Requests   Final    BOTTLES DRAWN AEROBIC AND ANAEROBIC Blood Culture adequate volume   Culture   Final    NO GROWTH 4 DAYS Performed at [facility], [address], [place], [place] [address]    Report Status PENDING  Incomplete  Resp Panel by RT-PCR (Flu A&B, Covid) Nasopharyngeal Swab     Status: None   Collection Time: 02/16/20  9:52 AM   Specimen:  Nasopharyngeal Swab; Nasopharyngeal(NP) swabs in vial transport medium  Result Value Ref Range Status   SARS Coronavirus 2 by RT PCR NEGATIVE NEGATIVE Final    Comment: (NOTE) SARS-CoV-2 target nucleic acids are NOT DETECTED.  The SARS-CoV-2 RNA is generally detectable in upper respiratory specimens during the acute phase of infection. The lowest concentration of SARS-CoV-2 viral copies this assay can detect is 138 copies/mL. A negative result does not preclude SARS-Cov-2 infection and should not be used as the sole basis for treatment or other patient  management decisions. A negative result may occur with  improper specimen collection/handling, submission of specimen other than nasopharyngeal swab, presence of viral mutation(s) within the areas targeted by this assay, and inadequate number of viral copies(<138 copies/mL). A negative result must be combined with clinical observations, patient history, and epidemiological information. The expected result is Negative.  Fact Sheet for Patients:  BloggerCourse.com  Fact Sheet for Healthcare Providers:  SeriousBroker.it  This test is no t yet approved or cleared by the [place] FDA and  has been authorized for detection and/or diagnosis of SARS-CoV-2 by FDA under an Emergency Use Authorization (EUA). This EUA will remain  in effect (meaning this test can be used) for the duration of the COVID-19 declaration under Section 564(b)(1) of the Act, 21 U.S.C.section 360bbb-3(b)(1), unless the authorization is terminated  or revoked sooner.       Influenza A by PCR NEGATIVE NEGATIVE Final   Influenza B by PCR NEGATIVE NEGATIVE Final    Comment: (NOTE) The Xpert Xpress SARS-CoV-2/FLU/RSV plus assay is intended as an aid in the diagnosis of influenza from Nasopharyngeal swab specimens and should not be used as a sole basis for treatment. Nasal washings and aspirates are unacceptable for  Xpert Xpress SARS-CoV-2/FLU/RSV testing.  Fact Sheet for Patients: BloggerCourse.com  Fact Sheet for Healthcare Providers: SeriousBroker.it  This test is not yet approved or cleared by the [place] FDA and has been authorized for detection and/or diagnosis of SARS-CoV-2 by FDA under an Emergency Use Authorization (EUA). This EUA will remain in effect (meaning this test can be used) for the duration of the COVID-19 declaration under Section 564(b)(1) of the Act, 21 U.S.C. section 360bbb-3(b)(1), unless the authorization is terminated or revoked.  Performed at [facility], [address] [place], [place] [address]   MRSA PCR Screening     Status: None   Collection Time: 02/17/20  8:57 PM   Specimen: Nasopharyngeal  Result Value Ref Range Status   MRSA by PCR NEGATIVE NEGATIVE Final    Comment:        The GeneXpert MRSA Assay (FDA approved for NASAL specimens only), is one component of a comprehensive MRSA colonization surveillance program. It is not intended to diagnose MRSA infection nor to guide or monitor treatment for MRSA infections. Performed at [facility], [address] [place], [place] [address]          Radiology Studies: NM Hepatobiliary Liver Func  Result Date: 02/19/2020 CLINICAL DATA:  44 year old male with history of 2 months of abdominal pain, worse in the right upper quadrant over the past 2 days with associated nausea and vomiting. Currently hospitalized for bilateral pulmonary emboli. EXAM: NUCLEAR MEDICINE HEPATOBILIARY IMAGING TECHNIQUE: Sequential images of the abdomen were obtained out to 60 minutes following intravenous administration of radiopharmaceutical. RADIOPHARMACEUTICALS:  5.13 mCi Tc-52m  Choletec IV COMPARISON:  CT abdomen pelvis from 02/11/2020, ultrasound abdomen from 02/12/2020 and 02/19/2020 FINDINGS: Prompt uptake and biliary excretion of activity by the  liver is seen. Gallbladder activity is not visualized after 60 minutes, however after administration of 3 mg morphine, radiotracer uptake within the gallbladder is immediately visualized, consistent with patency of cystic duct. Biliary activity passes into small bowel, consistent with patent common bile duct. IMPRESSION: Patent cystic duct, no evidence of acute cholecystitis. Marliss Coots, MD [facility] Radiology Electronically Signed   By: Marliss Coots MD   On: 02/19/2020 13:50   US Abdomen Limited RUQ (LIVER/GB)  Result Date: 02/19/2020  CLINICAL DATA:  Upper abdominal pain EXAM: ULTRASOUND ABDOMEN LIMITED RIGHT UPPER QUADRANT COMPARISON:  Ultrasound right upper quadrant February 12, 2020 FINDINGS: Gallbladder: There is sludge in the gallbladder. No gallstones are appreciable. The gallbladder wall remains thickened, and patient is focally tender over the gallbladder. There is equivocal pericholecystic fluid. No sonographic Murphy sign noted by sonographer. Common bile duct: Diameter: 3 mm. No intrahepatic or extrahepatic biliary duct dilatation. Liver: No focal lesion identified. Within normal limits in parenchymal echogenicity. Portal vein is patent on color Doppler imaging with normal direction of blood flow towards the liver. Other: None. IMPRESSION: Gallbladder wall remains thickened with equivocal pericholecystic fluid. Patient is focally tender over the gallbladder. There is sludge in the gallbladder without demonstrable gallstones. This appearance is concerning for a degree of acalculus cholecystitis. It may be prudent to consider nuclear medicine hepatobiliary imaging study to assess for cystic duct patency. Study otherwise unremarkable. These results will be called to the ordering clinician or representative by the Radiologist Assistant, and communication documented in the PACS or [employer]. Electronically Signed   By: Bretta Bang III  M.D.   On: 02/19/2020 09:00        Scheduled Meds: . colchicine  0.6 mg Oral BID  . febuxostat  40 mg Oral Daily  . furosemide  40 mg Intravenous Q12H  . metoprolol succinate  25 mg Oral Daily  . sacubitril-valsartan  1 tablet Oral BID   Continuous Infusions: . sodium chloride 250 mL (02/19/20 2115)  . heparin 3,550 Units/hr (02/20/20 0031)  . piperacillin-tazobactam (ZOSYN)  IV 3.375 g (02/20/20 0443)     LOS: 4 days    Time spent: 33 mins    Charise Killian, MD [facility] Pager [phone]  If 7PM-7AM, please contact night-coverage 02/20/2020, 8:09 AM

## 2020-02-20 NOTE — Progress Notes (Signed)
SUBJECTIVE: Patient denies chest pain or shortness of breath. Continues to have RUQ pain.     Vitals:   02/19/20 1939 02/20/20 0314 02/20/20 0459 02/20/20 0818  BP: 102/87 96/84  (!) 105/94  Pulse: 89 87  85  Resp: 18 16  18   Temp: 98.4 F (36.9 C) 98.2 F (36.8 C)  (!) 97.5 F (36.4 C)  TempSrc: Oral Oral  Oral  SpO2: 94% 92%  98%  Weight:   115.7 kg   Height:        Intake/Output Summary (Last 24 hours) at 02/20/2020 1059 Last data filed at 02/20/2020 02/22/2020 Gross per 24 hour  Intake 1511.29 ml  Output 2550 ml  Net -1038.71 ml    LABS: Basic Metabolic Panel: Recent Labs    02/19/20 0605 02/20/20 0256  NA 134* 133*  K 3.6 3.3*  CL 100 96*  CO2 23 26  GLUCOSE 78 94  BUN 14 11  CREATININE 1.07 0.94  CALCIUM 8.1* 7.6*  MG 1.6*  --    Liver Function Tests: Recent Labs    02/20/20 0256  AST 1,172*  ALT 1,484*  ALKPHOS 55  BILITOT 2.3*  PROT 5.5*  ALBUMIN 3.0*   No results for input(s): LIPASE, AMYLASE in the last 72 hours. CBC: Recent Labs    02/19/20 0605 02/20/20 0256  WBC 11.5* 9.4  NEUTROABS 7.1  --   HGB 14.7 15.1  HCT 43.5 44.7  MCV 92.0 90.7  PLT 204 208   Cardiac Enzymes: No results for input(s): CKTOTAL, CKMB, CKMBINDEX, TROPONINI in the last 72 hours. BNP: Invalid input(s): POCBNP D-Dimer: No results for input(s): DDIMER in the last 72 hours. Hemoglobin A1C: No results for input(s): HGBA1C in the last 72 hours. Fasting Lipid Panel: No results for input(s): CHOL, HDL, LDLCALC, TRIG, CHOLHDL, LDLDIRECT in the last 72 hours. Thyroid Function Tests: No results for input(s): TSH, T4TOTAL, T3FREE, THYROIDAB in the last 72 hours.  Invalid input(s): FREET3 Anemia Panel: No results for input(s): VITAMINB12, FOLATE, FERRITIN, TIBC, IRON, RETICCTPCT in the last 72 hours.   PHYSICAL EXAM General: Well developed, well nourished, in no acute distress HEENT:  Normocephalic and atramatic Neck:  No JVD.  Lungs: Clear bilaterally to  auscultation and percussion. Heart: HRRR . Normal S1 and S2 without gallops or murmurs.  Abdomen: RUQ tenderness Msk:  Back normal, normal gait. Normal strength and tone for age. Extremities: No clubbing, cyanosis or edema.   Neuro: Alert and oriented X 3. Psych:  Good affect, responds appropriately  TELEMETRY: NSR  ASSESSMENT AND PLAN: 44 year old male presenting to the emergency department with right leg swelling and RUQ abdominal pain.  Patient found to have cholecystitis, bilateral pulmonary emboli, and new onset HFrEF.  Would recommend continuing heparin infusion regarding pulmonary emboli with transition to DOAC prior to discharge.  Regarding new diagnosis of HFrEF patient will be started on metoprolol succinate and Entresto yesterday and tolerating well.  Further investigation can be done as an outpatient as patient is a young healthy male with extensive abnormalities.  We will continue to follow  Principal Problem:   Pulmonary embolism (HCC) Active Problems:   Essential hypertension   Depression, major, single episode, moderate (HCC)   Acute acalculous cholecystitis    59, NP-C 02/20/2020 10:59 AM

## 2020-02-20 NOTE — Progress Notes (Signed)
SURGICAL PROGRESS NOTE   Hospital Day(s): 4.   Post op day(s):  Marland Kitchen   Interval History: Patient seen and examined, no acute events or new complaints overnight. Patient reports feeling better today.  He reports his abdominal pain is improving.  He denies any fever or chills.  The pain now is mild to moderate in the right upper quadrant but does not radiate to other part of body.  Aggravating factor is applying pressure.  Alleviating factor was morphine.  Vital signs in last 24 hours: [min-max] current  Temp:  [97.5 F (36.4 C)-98.5 F (36.9 C)] 98.5 F (36.9 C) (12/30 1149) Pulse Rate:  [85-93] 93 (12/30 1149) Resp:  [16-18] 18 (12/30 1149) BP: (96-105)/(77-94) 97/77 (12/30 1149) SpO2:  [92 %-98 %] 94 % (12/30 1149) Weight:  [115.7 kg] 115.7 kg (12/30 0459)     Height: 6\' 2"  (188 cm) Weight: 115.7 kg BMI (Calculated): 32.74   Physical Exam:  Constitutional: alert, cooperative and no distress  Respiratory: breathing non-labored at rest  Cardiovascular: regular rate and sinus rhythm  Gastrointestinal: soft, mild-tender, and non-distended  Labs:  CBC Latest Ref Rng & Units 02/20/2020 02/19/2020 02/17/2020  WBC 4.0 - 10.5 K/uL 9.4 11.5(H) 9.9  Hemoglobin 13.0 - 17.0 g/dL 02/19/2020 25.0 53.9  Hematocrit 39.0 - 52.0 % 44.7 43.5 45.2  Platelets 150 - 400 K/uL 208 204 229   CMP Latest Ref Rng & Units 02/20/2020 02/19/2020 02/17/2020  Glucose 70 - 99 mg/dL 94 78 99  BUN 6 - 20 mg/dL 11 14 12   Creatinine 0.61 - 1.24 mg/dL 02/19/2020 3.41  Sodium 135 - 145 mmol/L 133(L) 134(L) 133(L)  Potassium 3.5 - 5.1 mmol/L 3.3(L) 3.6 4.5  Chloride 98 - 111 mmol/L 96(L) 100 103  CO2 22 - 32 mmol/L 26 23 23   Calcium 8.9 - 10.3 mg/dL 7.6(L) 8.1(L) 8.2(L)  Total Protein 6.5 - 8.1 g/dL 9.37) - -  Total Bilirubin 0.3 - 1.2 mg/dL 2.3(H) - -  Alkaline Phos 38 - 126 U/L 55 - -  AST 15 - 41 U/L 1,172(H) - -  ALT 0 - 44 U/L 1,484(H) - -    Imaging studies: I personally evaluated the HIDA scan done  yesterday.  I also evaluated the images of the abdominal ultrasound.   Assessment/Plan:  44 y.o.malewith acute versus subacute cholecystitis, complicated by pertinent comorbidities includingacute pulmonary embolism.  Today there was a significant improvement in the abdominal pain.  This is a first-time that there is improvement in his pain clinically.  With this improvement I agree to hold percutaneous cholecystostomy.  IR evaluated the patient yesterday and they feel that since there was visualization of the gallbladder they consider that the cystic duct is open and the cholecystomy tube will not help with this patient's pain.  Since the pain is improving I agree to hold cholecystostomy tube for now.  I recommend to continue IV antibiotic therapy.  Patient with increase in liver enzymes most likely medically induced hepatitis.  Primary care managing this medical issue.  I will continue to follow for the management of cholecystitis.  I discussed with the wife the patient's improvement in abdominal pain and the plan to hold cholecystostomy.  I advance his diet to heart healthy diet.  I encouraged the patient to ambulate.  Will continue medical management as per primary team.  , MD

## 2020-02-20 NOTE — Telephone Encounter (Signed)
Pt was in hospital recently, per the chart looks like he has a new pcp.  Please call and ask.   Thx,  Dr. Ladona Ridgel

## 2020-02-20 NOTE — Consult Note (Signed)
ANTICOAGULATION CONSULT NOTE   Pharmacy Consult for Heparin  Indication: pulmonary embolus  Patient Measurements: Heparin Dosing Weight: 104 kg   Labs: Recent Labs    02/18/20 1813 02/18/20 2109 02/19/20 0605 02/19/20 0914 02/19/20 1942 02/20/20 0256 02/20/20 0855  HGB  --   --  14.7  --   --  15.1  --   HCT  --   --  43.5  --   --  44.7  --   PLT  --   --  204  --   --  208  --   HEPARINUNFRC  --  0.14*  --    < > 0.12* 0.62 0.56  CREATININE  --   --  1.07  --   --  0.94  --   TROPONINIHS 38* 46*  --   --   --   --   --    < > = values in this interval not displayed.    Estimated Creatinine Clearance: 135.6 mL/min (by C-G formula based on SCr of 0.94 mg/dL).   Medical History: Past Medical History:  Diagnosis Date  . Asthma    AS A CHILD ONLY  . Foot pain, right    painful x 1 month and swelling    Medications:  Confirmed with patient no anticoagulants at this time.    Assessment: Patient is a 44 y/o M with medical history as above who presented to the ED on 12/26 with shortness of breath and bilateral leg swelling. Subsequently found to have bilateral pulmonary emboli as well as non-occlusive thrombus in left common femoral vein. Pharmacy consulted to dose heparin infusion for PE.  12/26 CT chest: Bilateral pulmonary emboli worst in the right lower lobe. No findings to suggest right heart strain are noted. 12/26 LE [place]: Nonocclusive thrombus within LEFT common femoral vein 12/29: pt underwent gallbladder ultrasound for preparation of percutaneous cholecystostomy drainage and HIDA scan - unclear at this time if heparin gtt stopped for procedure per nursing - will follow up this afternoon.  Baseline aPTT 31 seconds, INR 1.4. H&H, platelets WNL.  Heparin course: 12/26 1121: IV heparin 5000 unit bolus x 1 followed by continuous infusion at 1650 units/hr 12/26 1835 HL > 3.6, supratherapeutic. RN held drip at 2005 12/26 2045 HL 0.15 (re-check with drip off).  12/27  0400 HL 0.21, SUBtherapeutic,  3000 unit IV x 1 and increased  infusion to 1600 units/h 1227 1100 HL < 0.10, subtherapeutic  1227 1930 HL = 0.16 subtherapeutic  12/27 1932 HL 0.16, subtherapeutic 12/28 0524 HL 0.25, subtherapeutic 12/28 1503 HL 0.19, subtherapeutic 12/28 0311 HL 0.18, subtherapeutic 12/29 0914 HL 0.27, subtherapeutic 12/29 1942 HL 0.12; subtherapeutic (no interruption per RN since 1400 charted) 12/30 0256 HL 0.62, therapeutic 12/30 0855 HL 0.56, therapeutic x2  Goal of Therapy:  Heparin level 0.3-0.7 units/ml Monitor platelets by anticoagulation protocol: Yes   Plan:  Continue with heparin drip rate at 3550 units/hr. Recheck HL with AM labs Daily CBC per protocol while on heparin drip.  Reatha Armour, PharmD Pharmacy Resident  02/20/2020 3:29 PM

## 2020-02-21 DIAGNOSIS — K810 Acute cholecystitis: Secondary | ICD-10-CM | POA: Diagnosis not present

## 2020-02-21 DIAGNOSIS — I2699 Other pulmonary embolism without acute cor pulmonale: Secondary | ICD-10-CM | POA: Diagnosis not present

## 2020-02-21 DIAGNOSIS — K7200 Acute and subacute hepatic failure without coma: Secondary | ICD-10-CM | POA: Diagnosis not present

## 2020-02-21 LAB — CULTURE, BLOOD (ROUTINE X 2)
Culture: NO GROWTH
Culture: NO GROWTH
Special Requests: ADEQUATE
Special Requests: ADEQUATE

## 2020-02-21 LAB — MAGNESIUM: Magnesium: 1.4 mg/dL — ABNORMAL LOW (ref 1.7–2.4)

## 2020-02-21 LAB — COMPREHENSIVE METABOLIC PANEL
ALT: 1056 U/L — ABNORMAL HIGH (ref 0–44)
AST: 538 U/L — ABNORMAL HIGH (ref 15–41)
Albumin: 3 g/dL — ABNORMAL LOW (ref 3.5–5.0)
Alkaline Phosphatase: 50 U/L (ref 38–126)
Anion gap: 8 (ref 5–15)
BUN: 8 mg/dL (ref 6–20)
CO2: 35 mmol/L — ABNORMAL HIGH (ref 22–32)
Calcium: 7.9 mg/dL — ABNORMAL LOW (ref 8.9–10.3)
Chloride: 94 mmol/L — ABNORMAL LOW (ref 98–111)
Creatinine, Ser: 0.92 mg/dL (ref 0.61–1.24)
GFR, Estimated: >60 mL/min (ref >60)
Glucose, Bld: 96 mg/dL (ref 70–99)
Potassium: 3.3 mmol/L — ABNORMAL LOW (ref 3.5–5.1)
Sodium: 137 mmol/L (ref 135–145)
Total Bilirubin: 2.3 mg/dL — ABNORMAL HIGH (ref 0.3–1.2)
Total Protein: 5.6 g/dL — ABNORMAL LOW (ref 6.5–8.1)

## 2020-02-21 LAB — HEPARIN LEVEL (UNFRACTIONATED)
Heparin Unfractionated: 0.86 IU/mL — ABNORMAL HIGH (ref 0.30–0.70)
Heparin Unfractionated: 0.63 IU/mL (ref 0.30–0.70)

## 2020-02-21 LAB — CBC
HCT: 47.2 % (ref 39.0–52.0)
Hemoglobin: 15.3 g/dL (ref 13.0–17.0)
MCH: 30 pg (ref 26.0–34.0)
MCHC: 32.4 g/dL (ref 30.0–36.0)
MCV: 92.5 fL (ref 80.0–100.0)
Platelets: 200 10*3/uL (ref 150–400)
RBC: 5.1 MIL/uL (ref 4.22–5.81)
RDW: 16.1 % — ABNORMAL HIGH (ref 11.5–15.5)
WBC: 6.8 10*3/uL (ref 4.0–10.5)
nRBC: 0.6 % — ABNORMAL HIGH (ref 0.0–0.2)

## 2020-02-21 LAB — ACETAMINOPHEN LEVEL: Acetaminophen (Tylenol), Serum: <10 ug/mL — ABNORMAL LOW (ref 10–30)

## 2020-02-21 MED ORDER — POTASSIUM CHLORIDE CRYS ER 20 MEQ PO TBCR
40.0000 meq | EXTENDED_RELEASE_TABLET | Freq: Once | ORAL | Status: AC
  Administered 2020-02-21: 40 meq via ORAL
  Filled 2020-02-21: qty 2

## 2020-02-21 MED ORDER — AMOXICILLIN-POT CLAVULANATE 875-125 MG PO TABS: 1.0000 | ORAL_TABLET | Freq: Two times a day (BID) | ORAL | 0 refills | Status: DC

## 2020-02-21 MED ORDER — APIXABAN 5 MG PO TABS
10.0000 mg | ORAL_TABLET | Freq: Two times a day (BID) | ORAL | Status: DC
  Administered 2020-02-21 – 2020-02-22 (×3): 10 mg via ORAL
  Filled 2020-02-21 (×3): qty 2

## 2020-02-21 MED ORDER — MAGNESIUM SULFATE 2 GM/50ML IV SOLN
2.0000 g | Freq: Once | INTRAVENOUS | Status: AC
  Administered 2020-02-21: 2 g via INTRAVENOUS
  Filled 2020-02-21: qty 50

## 2020-02-21 MED ORDER — APIXABAN 5 MG PO TABS: 5.0000 mg | ORAL_TABLET | Freq: Two times a day (BID) | ORAL | Status: DC

## 2020-02-21 MED ORDER — AMOXICILLIN-POT CLAVULANATE 875-125 MG PO TABS
1.0000 | ORAL_TABLET | Freq: Two times a day (BID) | ORAL | Status: DC
  Administered 2020-02-21 – 2020-02-22 (×3): 1 via ORAL
  Filled 2020-02-21 (×3): qty 1

## 2020-02-21 MED ORDER — HYDROCODONE-ACETAMINOPHEN 10-325 MG PO TABS: 1.0000 | ORAL_TABLET | Freq: Four times a day (QID) | ORAL | 0 refills | Status: DC | PRN

## 2020-02-21 MED ORDER — APIXABAN 5 MG PO TABS: 10.0000 mg | ORAL_TABLET | Freq: Two times a day (BID) | ORAL | Status: DC

## 2020-02-21 NOTE — Progress Notes (Signed)
SURGICAL PROGRESS NOTE   Hospital Day(s): 5.   Post op day(s):  Marland Kitchen   Interval History: Patient seen and examined, no acute events or new complaints overnight. Patient reports feeling very comfortable.  He reports that the pain on the right upper quadrant is very minimal.  Pain is not aggravated by food.  There has been no fever or chills.  The events are trending down.  White blood cells within normal limits.  Stable hemoglobin.  No shortness of breath.  No chest pain.  Vital signs in last 24 hours: [min-max] current  Temp:  [97.8 F (36.6 C)-98.5 F (36.9 C)] 97.9 F (36.6 C) (12/31 0754) Pulse Rate:  [64-99] 87 (12/31 0754) Resp:  [17-20] 17 (12/31 0754) BP: (97-148)/(74-120) 114/74 (12/31 0754) SpO2:  [92 %-98 %] 98 % (12/31 0754)     Height: 6\' 2"  (188 cm) Weight: 115.7 kg BMI (Calculated): 32.74   Physical Exam:  Constitutional: alert, cooperative and no distress  Respiratory: breathing non-labored at rest  Cardiovascular: regular rate and sinus rhythm  Gastrointestinal: soft, minimal-tender, and non-distended  Labs:  CBC Latest Ref Rng & Units 02/21/2020 02/20/2020 02/19/2020  WBC 4.0 - 10.5 K/uL 6.8 9.4 11.5(H)  Hemoglobin 13.0 - 17.0 g/dL 02/21/2020 22.9 79.8  Hematocrit 39.0 - 52.0 % 47.2 44.7 43.5  Platelets 150 - 400 K/uL 200 208 204   CMP Latest Ref Rng & Units 02/21/2020 02/20/2020 02/19/2020  Glucose 70 - 99 mg/dL 96 94 78  BUN 6 - 20 mg/dL 8 11 14   Creatinine 0.61 - 1.24 mg/dL 02/21/2020 1.94  Sodium 135 - 145 mmol/L 137 133(L) 134(L)  Potassium 3.5 - 5.1 mmol/L 3.3(L) 3.3(L) 3.6  Chloride 98 - 111 mmol/L 94(L) 96(L) 100  CO2 22 - 32 mmol/L 35(H) 26 23  Calcium 8.9 - 10.3 mg/dL 7.9(L) 7.6(L) 8.1(L)  Total Protein 6.5 - 8.1 g/dL 1.74) 0.81) -  Total Bilirubin 0.3 - 1.2 mg/dL 2.3(H) 2.3(H) -  Alkaline Phos 38 - 126 U/L 50 55 -  AST 15 - 41 U/L 538(H) 1,172(H) -  ALT 0 - 44 U/L 1,056(H) 1,484(H) -    Imaging studies: No new pertinent imaging  studies   Assessment/Plan:  44 y.o.malewith acute versus subacute cholecystitis, complicated by pertinent comorbidities includingacute pulmonary embolism.  Patient continued to recover adequately.  Continue with minimal pain.  Patient is tolerating regular diet.  I will transition see antibiotic to oral antibiotic.  He will need to finish antibiotic therapy for 14 days but this can be done as outpatient if patient is medically stable.  I will also prescribe pain medication for as needed basis.  I will follow up patient in my clinic within a week of discharge.  Continue current management of medical comorbidities by primary team and consultants.  We will keep following while patient is still in-house.  4.4(Y, MD

## 2020-02-21 NOTE — Consult Note (Signed)
ANTICOAGULATION CONSULT NOTE   Pharmacy Consult for Heparin  Indication: pulmonary embolus  Patient Measurements: Heparin Dosing Weight: 104 kg   Labs: Recent Labs     0000 02/18/20 1813 02/18/20 2109 02/19/20 0605 02/19/20 0914 02/20/20 0256 02/20/20 0855 02/21/20 0645  HGB   < >  --   --  14.7  --  15.1  --  15.3  HCT  --   --   --  43.5  --  44.7  --  47.2  PLT  --   --   --  204  --  208  --  200  HEPARINUNFRC  --   --  0.14*  --    < > 0.62 0.56 0.86*  CREATININE  --   --   --  1.07  --  0.94  --  0.92  TROPONINIHS  --  38* 46*  --   --   --   --   --    < > = values in this interval not displayed.    Estimated Creatinine Clearance: 138.6 mL/min (by C-G formula based on SCr of 0.92 mg/dL).   Medical History: Past Medical History:  Diagnosis Date  . Asthma    AS A CHILD ONLY  . Foot pain, right    painful x 1 month and swelling    Medications:  Confirmed with patient no anticoagulants at this time.    Assessment: Patient is a 44 y/o M with medical history as above who presented to the ED on 12/26 with shortness of breath and bilateral leg swelling. Subsequently found to have bilateral pulmonary emboli as well as non-occlusive thrombus in left common femoral vein. Pharmacy consulted to dose heparin infusion for PE.  12/26 CT chest: Bilateral pulmonary emboli worst in the right lower lobe. No findings to suggest right heart strain are noted. 12/26 LE [place]: Nonocclusive thrombus within LEFT common femoral vein 12/29: pt underwent gallbladder ultrasound for preparation of percutaneous cholecystostomy drainage and HIDA scan - unclear at this time if heparin gtt stopped for procedure per nursing - will follow up this afternoon.  Baseline aPTT 31 seconds, INR 1.4. H&H, platelets WNL.  Heparin course: 12/29 1942 HL 0.12; subtherapeutic (no interruption per RN since 1400 charted) 12/30 0256 HL 0.62, therapeutic 12/30 0855 HL 0.56, therapeutic x2 12/31 0645 HL 0.86  supra therapeutic.    Goal of Therapy:  Heparin level 0.3-0.7 units/ml Monitor platelets by anticoagulation protocol: Yes   Plan:  Heparin level is supratherapeutic. Will decrease the heparin rate to 3350 units/hr. Recheck heparin level in 6 hours. CBC daily while on heparin. Ronnald Ramp, PharmD 02/21/2020 8:24 AM

## 2020-02-21 NOTE — Progress Notes (Signed)
SUBJECTIVE: Patient denies any symptoms   Vitals:   02/20/20 1713 02/20/20 1932 02/21/20 0607 02/21/20 0754  BP: 116/74 102/74 (!) 148/120 114/74  Pulse: 99 92 64 87  Resp: 18 20 20 17   Temp: 98.4 F (36.9 C)  97.8 F (36.6 C) 97.9 F (36.6 C)  TempSrc:   Oral Oral  SpO2: 97% 92% 94% 98%  Weight:      Height:        Intake/Output Summary (Last 24 hours) at 02/21/2020 0940 Last data filed at 02/21/2020 0700 Gross per 24 hour  Intake 1168.69 ml  Output 6100 ml  Net -4931.31 ml    LABS: Basic Metabolic Panel: Recent Labs    02/19/20 0605 02/20/20 0256 02/21/20 0645  NA 134* 133* 137  K 3.6 3.3* 3.3*  CL 100 96* 94*  CO2 23 26 35*  GLUCOSE 78 94 96  BUN 14 11 8   CREATININE 1.07 0.94 0.92  CALCIUM 8.1* 7.6* 7.9*  MG 1.6*  --  1.4*   Liver Function Tests: Recent Labs    02/20/20 0256 02/21/20 0645  AST 1,172* 538*  ALT 1,484* 1,056*  ALKPHOS 55 50  BILITOT 2.3* 2.3*  PROT 5.5* 5.6*  ALBUMIN 3.0* 3.0*   No results for input(s): LIPASE, AMYLASE in the last 72 hours. CBC: Recent Labs    02/19/20 0605 02/20/20 0256 02/21/20 0645  WBC 11.5* 9.4 6.8  NEUTROABS 7.1  --   --   HGB 14.7 15.1 15.3  HCT 43.5 44.7 47.2  MCV 92.0 90.7 92.5  PLT 204 208 200   Cardiac Enzymes: No results for input(s): CKTOTAL, CKMB, CKMBINDEX, TROPONINI in the last 72 hours. BNP: Invalid input(s): POCBNP D-Dimer: No results for input(s): DDIMER in the last 72 hours. Hemoglobin A1C: No results for input(s): HGBA1C in the last 72 hours. Fasting Lipid Panel: No results for input(s): CHOL, HDL, LDLCALC, TRIG, CHOLHDL, LDLDIRECT in the last 72 hours. Thyroid Function Tests: No results for input(s): TSH, T4TOTAL, T3FREE, THYROIDAB in the last 72 hours.  Invalid input(s): FREET3 Anemia Panel: No results for input(s): VITAMINB12, FOLATE, FERRITIN, TIBC, IRON, RETICCTPCT in the last 72 hours.   PHYSICAL EXAM General: Well developed, well nourished, in no acute  distress HEENT:  Normocephalic and atramatic Neck:  No JVD.  Lungs: Clear bilaterally to auscultation and percussion. Heart: HRRR . Normal S1 and S2 without gallops or murmurs.  Abdomen: Bowel sounds are positive, abdomen soft and non-tender  Msk:  Back normal, normal gait. Normal strength and tone for age. Extremities: No clubbing, cyanosis or edema.   Neuro: Alert and oriented X 3. Psych:  Good affect, responds appropriately  TELEMETRY: Sinus rhythm  ASSESSMENT AND PLAN: Pulmonary embolism and history of cholecystitis.  Patient upon discharge can be followed up Tuesday at 10:00 in my office.  Principal Problem:   Pulmonary embolism (HCC) Active Problems:   Essential hypertension   Depression, major, single episode, moderate (HCC)   Acute acalculous cholecystitis    Dezirae Service A, MD, [facility] 02/21/2020 9:40 AM

## 2020-02-21 NOTE — Progress Notes (Signed)
PROGRESS NOTE    Ryan Gibson  ONG:[id number] DOB: Mar 16, 1975 DOA: 02/16/2020 PCP: Wilson Singer, MD    Assessment & Plan:   Principal Problem:   Pulmonary embolism (HCC) Active Problems:   Essential hypertension   Depression, major, single episode, moderate (HCC)   Acute acalculous cholecystitis   Bilateral pulmonary emboli: d/c IV heparin drip and start eliquis 10 mg BID x 7 days and then 5 mg BID for at least 3 months    Leucocytosis: resolved  Acute hypoxic respiratory failure: secondary to b/l PE. Resolved   Acute acalculous cholecystitis: hold off on cholecystectomy drain as per general surg & IR. HIDA shows patent cystic duct, no evidence of acute cholecystitis. Transition to augmentin for 14 day course all together as per general surg. F/u outpatient w/ general surg w/in 1 week   Acute hepatic failure: severe, etiology unclear. Still elevated but trending down. Hepatitis panel neg. Will hold home dose of febuxostat, colchicine as it can cause hepatotoxicity. Pt denies alcohol abuse, taking tylenol daily or illicit drug use   Hypokalemia: KCl repleted. Will continue to monitor  HTN: resolved. No on any anti-HTN meds at home   Hyponatremia: resolved  Hypomagnesemia: Mg sulfate ordered. Will continue to monitor    DVT prophylaxis: eliquis  Code Status: full  Family Communication:  Disposition Plan:  Likely d/c back home  Status is: Inpatient  Remains inpatient appropriate because:Ongoing active pain requiring inpatient pain management, Ongoing diagnostic testing needed not appropriate for outpatient work up and IV treatments appropriate due to intensity of illness or inability to take PO   Dispo: The patient is from: Home              Anticipated d/c is to: Home              Anticipated d/c date is: 1 or 2 days               Patient currently is not medically stable to d/c.      Consultants:   General surg   Procedures:     Antimicrobials:    Subjective: Pt c/o fatigue   Objective: Vitals:   02/20/20 1713 02/20/20 1932 02/21/20 0607 02/21/20 0754  BP: 116/74 102/74 (!) 148/120 114/74  Pulse: 99 92 64 87  Resp: 18 20 20 17   Temp: 98.4 F (36.9 C)  97.8 F (36.6 C) 97.9 F (36.6 C)  TempSrc:   Oral Oral  SpO2: 97% 92% 94% 98%  Weight:      Height:        Intake/Output Summary (Last 24 hours) at 02/21/2020 0756 Last data filed at 02/21/2020 0700 Gross per 24 hour  Intake 1168.69 ml  Output 6100 ml  Net -4931.31 ml   Filed Weights   02/17/20 2040 02/18/20 0553 02/20/20 0459  Weight: 121.8 kg 121.8 kg 115.7 kg    Examination:  General exam: Appears calm & comfortable  Respiratory system: diminished breath sounds b/l Cardiovascular system: S1/S2+. No rubs or gallops Gastrointestinal system: Abd is soft, non-tender, non-distended & hypoactive bowel sounds  Central nervous system: Alert and oriented. Moves all 4 extremities Psychiatry: Judgement and insight appear normal. Flat mood affect    Data Reviewed: I have personally reviewed following labs and imaging studies  CBC: Recent Labs  Lab 02/16/20 0939 02/17/20 0400 02/19/20 0605 02/20/20 0256 02/21/20 0645  WBC 11.0* 9.9 11.5* 9.4 6.8  NEUTROABS 7.4  --  7.1  --   --  HGB 15.5 14.6 14.7 15.1 15.3  HCT 47.6 45.2 43.5 44.7 47.2  MCV 93.9 94.8 92.0 90.7 92.5  PLT 273 229 204 208 200   Basic Metabolic Panel: Recent Labs  Lab 02/16/20 0939 02/17/20 0400 02/19/20 0605 02/20/20 0256 02/21/20 0645  NA 133* 133* 134* 133* 137  K 4.1 4.5 3.6 3.3* 3.3*  CL 98 103 100 96* 94*  CO2 24 23 23 26  35*  GLUCOSE 130* 99 78 94 96  BUN 11 12 14 11 8   CREATININE 1.00 0.92 1.07 0.94 0.92  CALCIUM 8.7* 8.2* 8.1* 7.6* 7.9*  MG  --   --  1.6*  --  1.4*   GFR: Estimated Creatinine Clearance: 138.6 mL/min (by C-G formula based on SCr of 0.92 mg/dL). Liver Function Tests: Recent Labs  Lab 02/16/20 0939 02/20/20 0256  02/21/20 0645  AST 36 1,172* 538*  ALT 77* 1,484* 1,056*  ALKPHOS 62 55 50  BILITOT 1.7* 2.3* 2.3*  PROT 6.5 5.5* 5.6*  ALBUMIN 3.7 3.0* 3.0*   Recent Labs  Lab 02/16/20 0939  LIPASE 27   No results for input(s): AMMONIA in the last 168 hours. Coagulation Profile: Recent Labs  Lab 02/16/20 0937  INR 1.4*   Cardiac Enzymes: No results for input(s): CKTOTAL, CKMB, CKMBINDEX, TROPONINI in the last 168 hours. BNP (last 3 results) No results for input(s): PROBNP in the last 8760 hours. HbA1C: No results for input(s): HGBA1C in the last 72 hours. CBG: No results for input(s): GLUCAP in the last 168 hours. Lipid Profile: No results for input(s): CHOL, HDL, LDLCALC, TRIG, CHOLHDL, LDLDIRECT in the last 72 hours. Thyroid Function Tests: No results for input(s): TSH, T4TOTAL, FREET4, T3FREE, THYROIDAB in the last 72 hours. Anemia Panel: No results for input(s): VITAMINB12, FOLATE, FERRITIN, TIBC, IRON, RETICCTPCT in the last 72 hours. Sepsis Labs: Recent Labs  Lab 02/16/20 4742 02/16/20 1117  LATICACIDVEN 2.6* 1.4    Recent Results (from the past 240 hour(s))  Culture, blood (Routine x 2)     Status: None   Collection Time: 02/16/20  9:39 AM   Specimen: BLOOD  Result Value Ref Range Status   Specimen Description BLOOD LEFT HAND  Final   Special Requests   Final    BOTTLES DRAWN AEROBIC AND ANAEROBIC Blood Culture adequate volume   Culture   Final    NO GROWTH 5 DAYS Performed at [facility], [address] [place], [place] [address]    Report Status 02/21/2020 FINAL  Final  Culture, blood (Routine x 2)     Status: None   Collection Time: 02/16/20  9:39 AM   Specimen: BLOOD  Result Value Ref Range Status   Specimen Description BLOOD LEFT AC  Final   Special Requests   Final    BOTTLES DRAWN AEROBIC AND ANAEROBIC Blood Culture adequate volume   Culture   Final    NO GROWTH 5 DAYS Performed at [facility], [address] [place], [place]  [address]    Report Status 02/21/2020 FINAL  Final  Resp Panel by RT-PCR (Flu A&B, Covid) Nasopharyngeal Swab     Status: None   Collection Time: 02/16/20  9:52 AM   Specimen: Nasopharyngeal Swab; Nasopharyngeal(NP) swabs in vial transport medium  Result Value Ref Range Status   SARS Coronavirus 2 by RT PCR NEGATIVE NEGATIVE Final    Comment: (NOTE) SARS-CoV-2 target nucleic acids are NOT DETECTED.  The SARS-CoV-2 RNA is generally detectable in upper respiratory specimens during the  acute phase of infection. The lowest concentration of SARS-CoV-2 viral copies this assay can detect is 138 copies/mL. A negative result does not preclude SARS-Cov-2 infection and should not be used as the sole basis for treatment or other patient management decisions. A negative result may occur with  improper specimen collection/handling, submission of specimen other than nasopharyngeal swab, presence of viral mutation(s) within the areas targeted by this assay, and inadequate number of viral copies(<138 copies/mL). A negative result must be combined with clinical observations, patient history, and epidemiological information. The expected result is Negative.  Fact Sheet for Patients:  BloggerCourse.com  Fact Sheet for Healthcare Providers:  SeriousBroker.it  This test is no t yet approved or cleared by the [place] FDA and  has been authorized for detection and/or diagnosis of SARS-CoV-2 by FDA under an Emergency Use Authorization (EUA). This EUA will remain  in effect (meaning this test can be used) for the duration of the COVID-19 declaration under Section 564(b)(1) of the Act, 21 U.S.C.section 360bbb-3(b)(1), unless the authorization is terminated  or revoked sooner.       Influenza A by PCR NEGATIVE NEGATIVE Final   Influenza B by PCR NEGATIVE NEGATIVE Final    Comment: (NOTE) The Xpert Xpress SARS-CoV-2/FLU/RSV plus assay is intended as an  aid in the diagnosis of influenza from Nasopharyngeal swab specimens and should not be used as a sole basis for treatment. Nasal washings and aspirates are unacceptable for Xpert Xpress SARS-CoV-2/FLU/RSV testing.  Fact Sheet for Patients: BloggerCourse.com  Fact Sheet for Healthcare Providers: SeriousBroker.it  This test is not yet approved or cleared by the [place] FDA and has been authorized for detection and/or diagnosis of SARS-CoV-2 by FDA under an Emergency Use Authorization (EUA). This EUA will remain in effect (meaning this test can be used) for the duration of the COVID-19 declaration under Section 564(b)(1) of the Act, 21 U.S.C. section 360bbb-3(b)(1), unless the authorization is terminated or revoked.  Performed at [facility], [address] [place], [place] [address]   MRSA PCR Screening     Status: None   Collection Time: 02/17/20  8:57 PM   Specimen: Nasopharyngeal  Result Value Ref Range Status   MRSA by PCR NEGATIVE NEGATIVE Final    Comment:        The GeneXpert MRSA Assay (FDA approved for NASAL specimens only), is one component of a comprehensive MRSA colonization surveillance program. It is not intended to diagnose MRSA infection nor to guide or monitor treatment for MRSA infections. Performed at [facility], [address] [place], [place] [address]          Radiology Studies: NM Hepatobiliary Liver Func  Result Date: 02/19/2020 CLINICAL DATA:  44 year old male with history of 2 months of abdominal pain, worse in the right upper quadrant over the past 2 days with associated nausea and vomiting. Currently hospitalized for bilateral pulmonary emboli. EXAM: NUCLEAR MEDICINE HEPATOBILIARY IMAGING TECHNIQUE: Sequential images of the abdomen were obtained out to 60 minutes following intravenous administration of radiopharmaceutical. RADIOPHARMACEUTICALS:  5.13 mCi Tc-50m   Choletec IV COMPARISON:  CT abdomen pelvis from 02/11/2020, ultrasound abdomen from 02/12/2020 and 02/19/2020 FINDINGS: Prompt uptake and biliary excretion of activity by the liver is seen. Gallbladder activity is not visualized after 60 minutes, however after administration of 3 mg morphine, radiotracer uptake within the gallbladder is immediately visualized, consistent with patency of cystic duct. Biliary activity passes into small bowel, consistent with patent common bile duct. IMPRESSION: Patent cystic duct,  no evidence of acute cholecystitis. Marliss Coots, MD [facility] Electronically Signed   By: Marliss Coots MD   On: 02/19/2020 13:50   US Abdomen Limited RUQ (LIVER/GB)  Result Date: 02/19/2020 CLINICAL DATA:  Upper abdominal pain EXAM: ULTRASOUND ABDOMEN LIMITED RIGHT UPPER QUADRANT COMPARISON:  Ultrasound right upper quadrant February 12, 2020 FINDINGS: Gallbladder: There is sludge in the gallbladder. No gallstones are appreciable. The gallbladder wall remains thickened, and patient is focally tender over the gallbladder. There is equivocal pericholecystic fluid. No sonographic Murphy sign noted by sonographer. Common bile duct: Diameter: 3 mm. No intrahepatic or extrahepatic biliary duct dilatation. Liver: No focal lesion identified. Within normal limits in parenchymal echogenicity. Portal vein is patent on color Doppler imaging with normal direction of blood flow towards the liver. Other: None. IMPRESSION: Gallbladder wall remains thickened with equivocal pericholecystic fluid. Patient is focally tender over the gallbladder. There is sludge in the gallbladder without demonstrable gallstones. This appearance is concerning for a degree of acalculus cholecystitis. It may be prudent to consider nuclear medicine hepatobiliary imaging study to assess for cystic duct patency. Study otherwise unremarkable. These results will be called to the  ordering clinician or representative by the Radiologist Assistant, and communication documented in the PACS or [employer]. Electronically Signed   By: Bretta Bang III M.D.   On: 02/19/2020 09:00        Scheduled Meds: . furosemide  40 mg Intravenous Q12H  . metoprolol succinate  25 mg Oral Daily  . potassium chloride  40 mEq Oral Once  . sacubitril-valsartan  1 tablet Oral BID   Continuous Infusions: . sodium chloride 250 mL (02/19/20 2115)  . heparin 3,550 Units/hr (02/21/20 0410)  . magnesium sulfate bolus IVPB    . piperacillin-tazobactam (ZOSYN)  IV 3.375 g (02/21/20 0412)     LOS: 5 days    Time spent: 30 mins    Charise Killian, MD [facility] Pager [phone]  If 7PM-7AM, please contact night-coverage 02/21/2020, 7:56 AM

## 2020-02-22 DIAGNOSIS — I2699 Other pulmonary embolism without acute cor pulmonale: Secondary | ICD-10-CM | POA: Diagnosis not present

## 2020-02-22 DIAGNOSIS — K810 Acute cholecystitis: Secondary | ICD-10-CM | POA: Diagnosis not present

## 2020-02-22 DIAGNOSIS — K7200 Acute and subacute hepatic failure without coma: Secondary | ICD-10-CM | POA: Diagnosis not present

## 2020-02-22 LAB — CBC
HCT: 46.8 % (ref 39.0–52.0)
Hemoglobin: 15.2 g/dL (ref 13.0–17.0)
MCH: 30.4 pg (ref 26.0–34.0)
MCHC: 32.5 g/dL (ref 30.0–36.0)
MCV: 93.6 fL (ref 80.0–100.0)
Platelets: 193 10*3/uL (ref 150–400)
RBC: 5 MIL/uL (ref 4.22–5.81)
RDW: 16.4 % — ABNORMAL HIGH (ref 11.5–15.5)
WBC: 5.9 10*3/uL (ref 4.0–10.5)
nRBC: 0.7 % — ABNORMAL HIGH (ref 0.0–0.2)

## 2020-02-22 LAB — COMPREHENSIVE METABOLIC PANEL
ALT: 833 U/L — ABNORMAL HIGH (ref 0–44)
AST: 330 U/L — ABNORMAL HIGH (ref 15–41)
Albumin: 2.9 g/dL — ABNORMAL LOW (ref 3.5–5.0)
Alkaline Phosphatase: 50 U/L (ref 38–126)
Anion gap: 9 (ref 5–15)
BUN: 8 mg/dL (ref 6–20)
CO2: 34 mmol/L — ABNORMAL HIGH (ref 22–32)
Calcium: 8.4 mg/dL — ABNORMAL LOW (ref 8.9–10.3)
Chloride: 91 mmol/L — ABNORMAL LOW (ref 98–111)
Creatinine, Ser: 0.8 mg/dL (ref 0.61–1.24)
GFR, Estimated: >60 mL/min (ref >60)
Glucose, Bld: 80 mg/dL (ref 70–99)
Potassium: 3.7 mmol/L (ref 3.5–5.1)
Sodium: 134 mmol/L — ABNORMAL LOW (ref 135–145)
Total Bilirubin: 1.9 mg/dL — ABNORMAL HIGH (ref 0.3–1.2)
Total Protein: 5.6 g/dL — ABNORMAL LOW (ref 6.5–8.1)

## 2020-02-22 LAB — MAGNESIUM: Magnesium: 2 mg/dL (ref 1.7–2.4)

## 2020-02-22 MED ORDER — METOPROLOL SUCCINATE ER 25 MG PO TB24: 25.0000 mg | ORAL_TABLET | Freq: Every day | ORAL | 0 refills | Status: DC

## 2020-02-22 MED ORDER — AMOXICILLIN-POT CLAVULANATE 875-125 MG PO TABS: 1.0000 | ORAL_TABLET | Freq: Two times a day (BID) | ORAL | 0 refills | Status: AC

## 2020-02-22 MED ORDER — APIXABAN 5 MG PO TABS: 5.0000 mg | ORAL_TABLET | Freq: Two times a day (BID) | ORAL | 0 refills | Status: DC

## 2020-02-22 MED ORDER — APIXABAN 5 MG PO TABS: 10.0000 mg | ORAL_TABLET | Freq: Two times a day (BID) | ORAL | 0 refills | Status: DC

## 2020-02-22 MED ORDER — POTASSIUM CHLORIDE ER 10 MEQ PO TBCR: 10.0000 meq | EXTENDED_RELEASE_TABLET | Freq: Every day | ORAL | 0 refills | Status: DC

## 2020-02-22 MED ORDER — SACUBITRIL-VALSARTAN 24-26 MG PO TABS: 1.0000 | ORAL_TABLET | Freq: Two times a day (BID) | ORAL | 0 refills | Status: AC

## 2020-02-22 MED ORDER — FUROSEMIDE 40 MG PO TABS: 40.0000 mg | ORAL_TABLET | Freq: Every day | ORAL | 0 refills | Status: DC

## 2020-02-22 NOTE — TOC Transition Note (Signed)
Transition of Care [facility]) - CM/SW Discharge Note   Patient Details  Name: Ryan Gibson MRN: [id number] Date of Birth: 1975-03-30  [facility] CM/SW Contact:  Bing Quarry, RN Phone Number: 02/22/2020, 11:26 AM   Clinical Narrative:   02/22/20 1126 Pt awaiting discharge. Wife in room Eliquis coupon given to ensure access to medication over holiday period as patient was unsure of insurance coverage for this medication. Gabriel Cirri RN CM.     Final next level of care: Home/Self Care Barriers to Discharge: Barriers Resolved   Patient Goals and CMS Choice     Choice offered to / list presented to : NA  Discharge Placement                       Discharge Plan and Services                DME Arranged: N/A DME Agency: NA       HH Arranged: NA HH Agency: NA        Social Determinants of Health (SDOH) Interventions     Readmission Risk Interventions No flowsheet data found.

## 2020-02-22 NOTE — Progress Notes (Signed)
[name] to be D/C'd Home per MD order.  Discussed prescriptions and follow up appointments with the patient. Prescriptions electronically submitted, medication list explained in detail. Pt and wife verbalized understanding.  Allergies as of 02/22/2020      Reactions   No Known Allergies    Vancomycin Hives   hi8vess post op on R arm -site of infusion, and whole body itching      Medication List    STOP taking these medications   colchicine 0.6 MG tablet   diclofenac 75 MG EC tablet Commonly known as: VOLTAREN   febuxostat 40 MG tablet Commonly known as: ULORIC     TAKE these medications   albuterol 108 (90 Base) MCG/ACT inhaler Commonly known as: VENTOLIN HFA Inhale 1-2 puffs into the lungs every 6 (six) hours as needed for wheezing or shortness of breath.   amoxicillin-clavulanate 875-125 MG tablet Commonly known as: Augmentin Take 1 tablet by mouth every 12 (twelve) hours for 7 days. What changed: when to take this   apixaban 5 MG Tabs tablet Commonly known as: ELIQUIS Take 2 tablets (10 mg total) by mouth 2 (two) times daily for 6 days.   apixaban 5 MG Tabs tablet Commonly known as: ELIQUIS Take 1 tablet (5 mg total) by mouth 2 (two) times daily. Only start this script after taking eliquis 10mg  BID x 6 days Start taking on: February 28, 2020   furosemide 40 MG tablet Commonly known as: Lasix Take 1 tablet (40 mg total) by mouth daily.   HYDROcodone-acetaminophen 10-325 MG tablet Commonly known as: Norco Take 1 tablet by mouth every 6 (six) hours as needed.   metoprolol succinate 25 MG 24 hr tablet Commonly known as: TOPROL-XL Take 1 tablet (25 mg total) by mouth daily.   potassium chloride 10 MEQ tablet Commonly known as: KLOR-CON Take 1 tablet (10 mEq total) by mouth daily.   sacubitril-valsartan 24-26 MG Commonly known as: ENTRESTO Take 1 tablet by mouth 2 (two) times daily.       Vitals:   02/22/20 0801 02/22/20 1033  BP: 101/77 113/66   Pulse: 88 97  Resp: 17   Temp: 97.8 F (36.6 C)   SpO2: 98%     Skin clean, dry and intact without evidence of skin break down, no evidence of skin tears noted. IV catheter discontinued intact. Site without signs and symptoms of complications. Dressing and pressure applied. Pt denies pain at this time. No complaints noted.  An After Visit Summary was printed and given to the patient. Patient escorted via WC, and D/C home via private auto.  Elyssia Strausser 04/21/20

## 2020-02-22 NOTE — Discharge Summary (Signed)
Physician Discharge Summary  Ryan Gibson [name] DOB: [birth year] DOA: 02/16/2020  PCP: Ryan Singer, MD  Admit date: 02/16/2020 Discharge date: 02/22/2020  Admitted From: home Disposition: home   Recommendations for Outpatient Follow-up:  1. Follow up with PCP in 1 week & needs CMP to get LFTs checked 2. F/u cardio, Ryan Gibson, in 1 week 3. F/u general surgery, Ryan Gibson, in 1 week   Home Health: no Equipment/Devices:  Discharge Condition: stable  CODE STATUS: full  Diet recommendation: Heart Healthy  Brief/Interim Summary: HPI was taken from Dr. Joylene Gibson: Ryan Gibson is a 46 y.o. male with medical history significant for pain and swelling in his right leg for the last 1 month. He presents to the ER for evaluation 2 days of nausea and pain in the right upper quadrant and epigastrium as well as dry cough. Patient has a known history of gall bladder disease and was placed on oral antibiotic therapy and advised to follow up with surgery. He presents for evaluation of shortness of breath initially with exertion/activity but now short of breath at rest. He is unable to lay flat in bed. Shortness of breath is associated with pleuritic chest discomfort and bilateral leg swelling. He denies having any fever or chills, no dizziness or light headedness, no diarrhea, no constipation, no urinary symptoms, no headache or cough.  No extremity weakness, no blurry vision Labs shows sodium 133, potassium 4.1, chloride 98, Hco3 24, BUN 11, creatinine 1.00, calcium 8.7, alkaline phosphatase 62, albumin 3.7, AST 36, ALT 77, total protein 6.5, Total bilirubin 1.7, Lactic acid 2.6 >> 1.4, WBC 11, hemoglobin 15.5, HCT 47.6, MCV 93.9, RDW 15.9, PT 17.0, INR 1.4 Chest x ray reviewed by me shows enlargement of cardiac silhouette without acute infiltrate. Respiratory viral panel is negative CTA of the chest showed bilateral pulmonary emboli worst in the right lower lobe. No findings to  suggest right heart strain are noted. Bilateral patchy infiltrates likely related to the underlying emboli and may represent early sequelae of pulmonary infarct. CT scan of the abdomen and pelvis done on 02/11/20 shows nonspecific gallbladder wall thickening versus pericholecystic fluid. No additional findings to suggest acute cholecystitis. Right upper quadrant ultrasound is recommended for further evaluation. Small volume abdominopelvic ascites. Minor colonic diverticulosis Gall bladder ultrasound done on 02/11/20 showed gallbladder wall edema with pericholecystic free fluid and positive sonographic Murphy's sign. No gallstones or biliary dilatation visualized.Hepatic steatosis. No focal hepatic lesion 12 Lead EKG reviewed by me shows sinus tachycardia with multiple PVC's    ED Course: Patient is a 45 year old male who presents to the ER for evaluation of shortness of breath associated with bilateral lower extremity swelling as well as nausea, vomiting and RUQ pain. Patient has a known history of acute cholecystitis and was referred to surgery as an out patient. He had a CTA of the chest which shows bilateral pulmonary emboli worst in the right lower lobe. No findings to suggest right heart strain are noted. Bilateral patchy infiltrates likely related to the underlying emboli and may represent early sequelae of pulmonary infarct. Patient was started on heparin drip and has been seen in consultation by surgery. He received a dose of IV Zosyn in the ER and will be admitted to the hospital for further evaluation.  Hospital Course from Dr. Mayford Gibson 02/19/20-02/22/20: Pt was found to have b/l pulmonary emboli of unknown etiology. Pt was initially put on IV heparin drip and switched to eliquis priro to d/c. Also, pt  through the popliteal and proximal calf veins. COMPARISON:  None FINDINGS: VENOUS RIGHT lower extremity: Normal compressibility of the common femoral, superficial femoral, and popliteal veins, as well as the visualized calf veins. Visualized portions of profunda femoral vein and great saphenous vein unremarkable. No filling defects to suggest DVT on grayscale or color Doppler imaging. Doppler waveforms show normal direction of venous flow, normal respiratory plasticity and response to augmentation. LEFT lower extremity: Small amount of nonocclusive thrombus identified within LEFT common femoral vein, with impaired compressibility. This extends to involve the saphenofemoral junction. Remaining deep venous system in the LEFT lower extremity is patent and compressible. Remaining  veins demonstrate spontaneous venous flow. Greater saphenous vein patent. OTHER N/A Limitations: None IMPRESSION: Nonocclusive thrombus within LEFT common femoral vein. Remaining deep venous system of the LEFT lower extremity is normal. No evidence of deep venous thrombosis in the RIGHT lower extremity. Electronically Signed   By: Ryan Southward M.D.   On: 02/16/2020 14:48   DG Chest Port 1 View  Result Date: 02/16/2020 CLINICAL DATA:  Presented to hospital with abdominal pain, abnormal CT and ultrasound exams with findings suggesting acute cholecystitis. Suspected sepsis EXAM: PORTABLE CHEST 1 VIEW COMPARISON:  Portable exam 0945 hours compared to 02/07/2020 FINDINGS: Enlargement of cardiac silhouette. Mediastinal contours and pulmonary vascularity normal. Lungs clear. No infiltrate, pleural effusion or pneumothorax. Levoconvex upper thoracic scoliosis. IMPRESSION: Enlargement of cardiac silhouette without acute infiltrate. Electronically Signed   By: Ryan Southward M.D.   On: 02/16/2020 10:00   ECHOCARDIOGRAM COMPLETE  Result Date: 02/17/2020    ECHOCARDIOGRAM REPORT   Patient Name:   Ryan Gibson Date of Exam: 02/16/2020 Medical Rec #:  [id number]           Height:       73.0 in Accession #:    [id number]          Weight:       245.0 lb Date of Birth:  May 08, 1975          BSA:          2.345 m Patient Age:    44 years            BP:           112/96 mmHg Patient Gender: M                   HR:           104 bpm. Exam Location:  [facility] Procedure: 2D Echo, Cardiac Doppler and Color Doppler Indications:     Pulmonary Embolus 415.19 / I26.99  History:         Patient has no prior history of Echocardiogram examinations.  Sonographer:     Neysa Bonito Roar Referring Phys:  [id number] AGBATA Diagnosing Phys: Arnoldo Hooker MD IMPRESSIONS  1. Left ventricular ejection fraction, by estimation, is <20%. The left ventricle has severely decreased function. The left ventricle demonstrates global hypokinesis. The left  ventricular internal cavity size was severely dilated. There is mild left ventricular hypertrophy. Left ventricular diastolic parameters are consistent with Grade I diastolic dysfunction (impaired relaxation).  2. Right ventricular systolic function is low normal. The right ventricular size is mildly enlarged.  3. The mitral valve is normal in structure. Moderate to severe mitral valve regurgitation.  4. Tricuspid valve regurgitation is moderate.  5. The aortic valve is normal in structure. Aortic valve regurgitation is not visualized. FINDINGS  Left Ventricle: Left ventricular ejection fraction, by estimation, is <20%.  Physician Discharge Summary  Ryan Gibson [name] DOB: [birth year] DOA: 02/16/2020  PCP: Ryan Singer, MD  Admit date: 02/16/2020 Discharge date: 02/22/2020  Admitted From: home Disposition: home   Recommendations for Outpatient Follow-up:  1. Follow up with PCP in 1 week & needs CMP to get LFTs checked 2. F/u cardio, Ryan Gibson, in 1 week 3. F/u general surgery, Ryan Gibson, in 1 week   Home Health: no Equipment/Devices:  Discharge Condition: stable  CODE STATUS: full  Diet recommendation: Heart Healthy  Brief/Interim Summary: HPI was taken from Dr. Joylene Gibson: Ryan Gibson is a 46 y.o. male with medical history significant for pain and swelling in his right leg for the last 1 month. He presents to the ER for evaluation 2 days of nausea and pain in the right upper quadrant and epigastrium as well as dry cough. Patient has a known history of gall bladder disease and was placed on oral antibiotic therapy and advised to follow up with surgery. He presents for evaluation of shortness of breath initially with exertion/activity but now short of breath at rest. He is unable to lay flat in bed. Shortness of breath is associated with pleuritic chest discomfort and bilateral leg swelling. He denies having any fever or chills, no dizziness or light headedness, no diarrhea, no constipation, no urinary symptoms, no headache or cough.  No extremity weakness, no blurry vision Labs shows sodium 133, potassium 4.1, chloride 98, Hco3 24, BUN 11, creatinine 1.00, calcium 8.7, alkaline phosphatase 62, albumin 3.7, AST 36, ALT 77, total protein 6.5, Total bilirubin 1.7, Lactic acid 2.6 >> 1.4, WBC 11, hemoglobin 15.5, HCT 47.6, MCV 93.9, RDW 15.9, PT 17.0, INR 1.4 Chest x ray reviewed by me shows enlargement of cardiac silhouette without acute infiltrate. Respiratory viral panel is negative CTA of the chest showed bilateral pulmonary emboli worst in the right lower lobe. No findings to  suggest right heart strain are noted. Bilateral patchy infiltrates likely related to the underlying emboli and may represent early sequelae of pulmonary infarct. CT scan of the abdomen and pelvis done on 02/11/20 shows nonspecific gallbladder wall thickening versus pericholecystic fluid. No additional findings to suggest acute cholecystitis. Right upper quadrant ultrasound is recommended for further evaluation. Small volume abdominopelvic ascites. Minor colonic diverticulosis Gall bladder ultrasound done on 02/11/20 showed gallbladder wall edema with pericholecystic free fluid and positive sonographic Murphy's sign. No gallstones or biliary dilatation visualized.Hepatic steatosis. No focal hepatic lesion 12 Lead EKG reviewed by me shows sinus tachycardia with multiple PVC's    ED Course: Patient is a 45 year old male who presents to the ER for evaluation of shortness of breath associated with bilateral lower extremity swelling as well as nausea, vomiting and RUQ pain. Patient has a known history of acute cholecystitis and was referred to surgery as an out patient. He had a CTA of the chest which shows bilateral pulmonary emboli worst in the right lower lobe. No findings to suggest right heart strain are noted. Bilateral patchy infiltrates likely related to the underlying emboli and may represent early sequelae of pulmonary infarct. Patient was started on heparin drip and has been seen in consultation by surgery. He received a dose of IV Zosyn in the ER and will be admitted to the hospital for further evaluation.  Hospital Course from Dr. Mayford Gibson 02/19/20-02/22/20: Pt was found to have b/l pulmonary emboli of unknown etiology. Pt was initially put on IV heparin drip and switched to eliquis priro to d/c. Also, pt  Physician Discharge Summary  Ryan Gibson [name] DOB: [birth year] DOA: 02/16/2020  PCP: Ryan Singer, MD  Admit date: 02/16/2020 Discharge date: 02/22/2020  Admitted From: home Disposition: home   Recommendations for Outpatient Follow-up:  1. Follow up with PCP in 1 week & needs CMP to get LFTs checked 2. F/u cardio, Ryan Gibson, in 1 week 3. F/u general surgery, Ryan Gibson, in 1 week   Home Health: no Equipment/Devices:  Discharge Condition: stable  CODE STATUS: full  Diet recommendation: Heart Healthy  Brief/Interim Summary: HPI was taken from Dr. Joylene Gibson: Ryan Gibson is a 46 y.o. male with medical history significant for pain and swelling in his right leg for the last 1 month. He presents to the ER for evaluation 2 days of nausea and pain in the right upper quadrant and epigastrium as well as dry cough. Patient has a known history of gall bladder disease and was placed on oral antibiotic therapy and advised to follow up with surgery. He presents for evaluation of shortness of breath initially with exertion/activity but now short of breath at rest. He is unable to lay flat in bed. Shortness of breath is associated with pleuritic chest discomfort and bilateral leg swelling. He denies having any fever or chills, no dizziness or light headedness, no diarrhea, no constipation, no urinary symptoms, no headache or cough.  No extremity weakness, no blurry vision Labs shows sodium 133, potassium 4.1, chloride 98, Hco3 24, BUN 11, creatinine 1.00, calcium 8.7, alkaline phosphatase 62, albumin 3.7, AST 36, ALT 77, total protein 6.5, Total bilirubin 1.7, Lactic acid 2.6 >> 1.4, WBC 11, hemoglobin 15.5, HCT 47.6, MCV 93.9, RDW 15.9, PT 17.0, INR 1.4 Chest x ray reviewed by me shows enlargement of cardiac silhouette without acute infiltrate. Respiratory viral panel is negative CTA of the chest showed bilateral pulmonary emboli worst in the right lower lobe. No findings to  suggest right heart strain are noted. Bilateral patchy infiltrates likely related to the underlying emboli and may represent early sequelae of pulmonary infarct. CT scan of the abdomen and pelvis done on 02/11/20 shows nonspecific gallbladder wall thickening versus pericholecystic fluid. No additional findings to suggest acute cholecystitis. Right upper quadrant ultrasound is recommended for further evaluation. Small volume abdominopelvic ascites. Minor colonic diverticulosis Gall bladder ultrasound done on 02/11/20 showed gallbladder wall edema with pericholecystic free fluid and positive sonographic Murphy's sign. No gallstones or biliary dilatation visualized.Hepatic steatosis. No focal hepatic lesion 12 Lead EKG reviewed by me shows sinus tachycardia with multiple PVC's    ED Course: Patient is a 45 year old male who presents to the ER for evaluation of shortness of breath associated with bilateral lower extremity swelling as well as nausea, vomiting and RUQ pain. Patient has a known history of acute cholecystitis and was referred to surgery as an out patient. He had a CTA of the chest which shows bilateral pulmonary emboli worst in the right lower lobe. No findings to suggest right heart strain are noted. Bilateral patchy infiltrates likely related to the underlying emboli and may represent early sequelae of pulmonary infarct. Patient was started on heparin drip and has been seen in consultation by surgery. He received a dose of IV Zosyn in the ER and will be admitted to the hospital for further evaluation.  Hospital Course from Dr. Mayford Gibson 02/19/20-02/22/20: Pt was found to have b/l pulmonary emboli of unknown etiology. Pt was initially put on IV heparin drip and switched to eliquis priro to d/c. Also, pt  through the popliteal and proximal calf veins. COMPARISON:  None FINDINGS: VENOUS RIGHT lower extremity: Normal compressibility of the common femoral, superficial femoral, and popliteal veins, as well as the visualized calf veins. Visualized portions of profunda femoral vein and great saphenous vein unremarkable. No filling defects to suggest DVT on grayscale or color Doppler imaging. Doppler waveforms show normal direction of venous flow, normal respiratory plasticity and response to augmentation. LEFT lower extremity: Small amount of nonocclusive thrombus identified within LEFT common femoral vein, with impaired compressibility. This extends to involve the saphenofemoral junction. Remaining deep venous system in the LEFT lower extremity is patent and compressible. Remaining  veins demonstrate spontaneous venous flow. Greater saphenous vein patent. OTHER N/A Limitations: None IMPRESSION: Nonocclusive thrombus within LEFT common femoral vein. Remaining deep venous system of the LEFT lower extremity is normal. No evidence of deep venous thrombosis in the RIGHT lower extremity. Electronically Signed   By: Ryan Southward M.D.   On: 02/16/2020 14:48   DG Chest Port 1 View  Result Date: 02/16/2020 CLINICAL DATA:  Presented to hospital with abdominal pain, abnormal CT and ultrasound exams with findings suggesting acute cholecystitis. Suspected sepsis EXAM: PORTABLE CHEST 1 VIEW COMPARISON:  Portable exam 0945 hours compared to 02/07/2020 FINDINGS: Enlargement of cardiac silhouette. Mediastinal contours and pulmonary vascularity normal. Lungs clear. No infiltrate, pleural effusion or pneumothorax. Levoconvex upper thoracic scoliosis. IMPRESSION: Enlargement of cardiac silhouette without acute infiltrate. Electronically Signed   By: Ryan Southward M.D.   On: 02/16/2020 10:00   ECHOCARDIOGRAM COMPLETE  Result Date: 02/17/2020    ECHOCARDIOGRAM REPORT   Patient Name:   Ryan Gibson Date of Exam: 02/16/2020 Medical Rec #:  [id number]           Height:       73.0 in Accession #:    [id number]          Weight:       245.0 lb Date of Birth:  May 08, 1975          BSA:          2.345 m Patient Age:    44 years            BP:           112/96 mmHg Patient Gender: M                   HR:           104 bpm. Exam Location:  [facility] Procedure: 2D Echo, Cardiac Doppler and Color Doppler Indications:     Pulmonary Embolus 415.19 / I26.99  History:         Patient has no prior history of Echocardiogram examinations.  Sonographer:     Neysa Bonito Roar Referring Phys:  [id number] AGBATA Diagnosing Phys: Arnoldo Hooker MD IMPRESSIONS  1. Left ventricular ejection fraction, by estimation, is <20%. The left ventricle has severely decreased function. The left ventricle demonstrates global hypokinesis. The left  ventricular internal cavity size was severely dilated. There is mild left ventricular hypertrophy. Left ventricular diastolic parameters are consistent with Grade I diastolic dysfunction (impaired relaxation).  2. Right ventricular systolic function is low normal. The right ventricular size is mildly enlarged.  3. The mitral valve is normal in structure. Moderate to severe mitral valve regurgitation.  4. Tricuspid valve regurgitation is moderate.  5. The aortic valve is normal in structure. Aortic valve regurgitation is not visualized. FINDINGS  Left Ventricle: Left ventricular ejection fraction, by estimation, is <20%.  through the popliteal and proximal calf veins. COMPARISON:  None FINDINGS: VENOUS RIGHT lower extremity: Normal compressibility of the common femoral, superficial femoral, and popliteal veins, as well as the visualized calf veins. Visualized portions of profunda femoral vein and great saphenous vein unremarkable. No filling defects to suggest DVT on grayscale or color Doppler imaging. Doppler waveforms show normal direction of venous flow, normal respiratory plasticity and response to augmentation. LEFT lower extremity: Small amount of nonocclusive thrombus identified within LEFT common femoral vein, with impaired compressibility. This extends to involve the saphenofemoral junction. Remaining deep venous system in the LEFT lower extremity is patent and compressible. Remaining  veins demonstrate spontaneous venous flow. Greater saphenous vein patent. OTHER N/A Limitations: None IMPRESSION: Nonocclusive thrombus within LEFT common femoral vein. Remaining deep venous system of the LEFT lower extremity is normal. No evidence of deep venous thrombosis in the RIGHT lower extremity. Electronically Signed   By: Ryan Southward M.D.   On: 02/16/2020 14:48   DG Chest Port 1 View  Result Date: 02/16/2020 CLINICAL DATA:  Presented to hospital with abdominal pain, abnormal CT and ultrasound exams with findings suggesting acute cholecystitis. Suspected sepsis EXAM: PORTABLE CHEST 1 VIEW COMPARISON:  Portable exam 0945 hours compared to 02/07/2020 FINDINGS: Enlargement of cardiac silhouette. Mediastinal contours and pulmonary vascularity normal. Lungs clear. No infiltrate, pleural effusion or pneumothorax. Levoconvex upper thoracic scoliosis. IMPRESSION: Enlargement of cardiac silhouette without acute infiltrate. Electronically Signed   By: Ryan Southward M.D.   On: 02/16/2020 10:00   ECHOCARDIOGRAM COMPLETE  Result Date: 02/17/2020    ECHOCARDIOGRAM REPORT   Patient Name:   Ryan Gibson Date of Exam: 02/16/2020 Medical Rec #:  [id number]           Height:       73.0 in Accession #:    [id number]          Weight:       245.0 lb Date of Birth:  May 08, 1975          BSA:          2.345 m Patient Age:    44 years            BP:           112/96 mmHg Patient Gender: M                   HR:           104 bpm. Exam Location:  [facility] Procedure: 2D Echo, Cardiac Doppler and Color Doppler Indications:     Pulmonary Embolus 415.19 / I26.99  History:         Patient has no prior history of Echocardiogram examinations.  Sonographer:     Neysa Bonito Roar Referring Phys:  [id number] AGBATA Diagnosing Phys: Arnoldo Hooker MD IMPRESSIONS  1. Left ventricular ejection fraction, by estimation, is <20%. The left ventricle has severely decreased function. The left ventricle demonstrates global hypokinesis. The left  ventricular internal cavity size was severely dilated. There is mild left ventricular hypertrophy. Left ventricular diastolic parameters are consistent with Grade I diastolic dysfunction (impaired relaxation).  2. Right ventricular systolic function is low normal. The right ventricular size is mildly enlarged.  3. The mitral valve is normal in structure. Moderate to severe mitral valve regurgitation.  4. Tricuspid valve regurgitation is moderate.  5. The aortic valve is normal in structure. Aortic valve regurgitation is not visualized. FINDINGS  Left Ventricle: Left ventricular ejection fraction, by estimation, is <20%.  through the popliteal and proximal calf veins. COMPARISON:  None FINDINGS: VENOUS RIGHT lower extremity: Normal compressibility of the common femoral, superficial femoral, and popliteal veins, as well as the visualized calf veins. Visualized portions of profunda femoral vein and great saphenous vein unremarkable. No filling defects to suggest DVT on grayscale or color Doppler imaging. Doppler waveforms show normal direction of venous flow, normal respiratory plasticity and response to augmentation. LEFT lower extremity: Small amount of nonocclusive thrombus identified within LEFT common femoral vein, with impaired compressibility. This extends to involve the saphenofemoral junction. Remaining deep venous system in the LEFT lower extremity is patent and compressible. Remaining  veins demonstrate spontaneous venous flow. Greater saphenous vein patent. OTHER N/A Limitations: None IMPRESSION: Nonocclusive thrombus within LEFT common femoral vein. Remaining deep venous system of the LEFT lower extremity is normal. No evidence of deep venous thrombosis in the RIGHT lower extremity. Electronically Signed   By: Ryan Southward M.D.   On: 02/16/2020 14:48   DG Chest Port 1 View  Result Date: 02/16/2020 CLINICAL DATA:  Presented to hospital with abdominal pain, abnormal CT and ultrasound exams with findings suggesting acute cholecystitis. Suspected sepsis EXAM: PORTABLE CHEST 1 VIEW COMPARISON:  Portable exam 0945 hours compared to 02/07/2020 FINDINGS: Enlargement of cardiac silhouette. Mediastinal contours and pulmonary vascularity normal. Lungs clear. No infiltrate, pleural effusion or pneumothorax. Levoconvex upper thoracic scoliosis. IMPRESSION: Enlargement of cardiac silhouette without acute infiltrate. Electronically Signed   By: Ryan Southward M.D.   On: 02/16/2020 10:00   ECHOCARDIOGRAM COMPLETE  Result Date: 02/17/2020    ECHOCARDIOGRAM REPORT   Patient Name:   Ryan Gibson Date of Exam: 02/16/2020 Medical Rec #:  [id number]           Height:       73.0 in Accession #:    [id number]          Weight:       245.0 lb Date of Birth:  May 08, 1975          BSA:          2.345 m Patient Age:    44 years            BP:           112/96 mmHg Patient Gender: M                   HR:           104 bpm. Exam Location:  [facility] Procedure: 2D Echo, Cardiac Doppler and Color Doppler Indications:     Pulmonary Embolus 415.19 / I26.99  History:         Patient has no prior history of Echocardiogram examinations.  Sonographer:     Neysa Bonito Roar Referring Phys:  [id number] AGBATA Diagnosing Phys: Arnoldo Hooker MD IMPRESSIONS  1. Left ventricular ejection fraction, by estimation, is <20%. The left ventricle has severely decreased function. The left ventricle demonstrates global hypokinesis. The left  ventricular internal cavity size was severely dilated. There is mild left ventricular hypertrophy. Left ventricular diastolic parameters are consistent with Grade I diastolic dysfunction (impaired relaxation).  2. Right ventricular systolic function is low normal. The right ventricular size is mildly enlarged.  3. The mitral valve is normal in structure. Moderate to severe mitral valve regurgitation.  4. Tricuspid valve regurgitation is moderate.  5. The aortic valve is normal in structure. Aortic valve regurgitation is not visualized. FINDINGS  Left Ventricle: Left ventricular ejection fraction, by estimation, is <20%.  through the popliteal and proximal calf veins. COMPARISON:  None FINDINGS: VENOUS RIGHT lower extremity: Normal compressibility of the common femoral, superficial femoral, and popliteal veins, as well as the visualized calf veins. Visualized portions of profunda femoral vein and great saphenous vein unremarkable. No filling defects to suggest DVT on grayscale or color Doppler imaging. Doppler waveforms show normal direction of venous flow, normal respiratory plasticity and response to augmentation. LEFT lower extremity: Small amount of nonocclusive thrombus identified within LEFT common femoral vein, with impaired compressibility. This extends to involve the saphenofemoral junction. Remaining deep venous system in the LEFT lower extremity is patent and compressible. Remaining  veins demonstrate spontaneous venous flow. Greater saphenous vein patent. OTHER N/A Limitations: None IMPRESSION: Nonocclusive thrombus within LEFT common femoral vein. Remaining deep venous system of the LEFT lower extremity is normal. No evidence of deep venous thrombosis in the RIGHT lower extremity. Electronically Signed   By: Ryan Southward M.D.   On: 02/16/2020 14:48   DG Chest Port 1 View  Result Date: 02/16/2020 CLINICAL DATA:  Presented to hospital with abdominal pain, abnormal CT and ultrasound exams with findings suggesting acute cholecystitis. Suspected sepsis EXAM: PORTABLE CHEST 1 VIEW COMPARISON:  Portable exam 0945 hours compared to 02/07/2020 FINDINGS: Enlargement of cardiac silhouette. Mediastinal contours and pulmonary vascularity normal. Lungs clear. No infiltrate, pleural effusion or pneumothorax. Levoconvex upper thoracic scoliosis. IMPRESSION: Enlargement of cardiac silhouette without acute infiltrate. Electronically Signed   By: Ryan Southward M.D.   On: 02/16/2020 10:00   ECHOCARDIOGRAM COMPLETE  Result Date: 02/17/2020    ECHOCARDIOGRAM REPORT   Patient Name:   Ryan Gibson Date of Exam: 02/16/2020 Medical Rec #:  [id number]           Height:       73.0 in Accession #:    [id number]          Weight:       245.0 lb Date of Birth:  May 08, 1975          BSA:          2.345 m Patient Age:    44 years            BP:           112/96 mmHg Patient Gender: M                   HR:           104 bpm. Exam Location:  [facility] Procedure: 2D Echo, Cardiac Doppler and Color Doppler Indications:     Pulmonary Embolus 415.19 / I26.99  History:         Patient has no prior history of Echocardiogram examinations.  Sonographer:     Neysa Bonito Roar Referring Phys:  [id number] AGBATA Diagnosing Phys: Arnoldo Hooker MD IMPRESSIONS  1. Left ventricular ejection fraction, by estimation, is <20%. The left ventricle has severely decreased function. The left ventricle demonstrates global hypokinesis. The left  ventricular internal cavity size was severely dilated. There is mild left ventricular hypertrophy. Left ventricular diastolic parameters are consistent with Grade I diastolic dysfunction (impaired relaxation).  2. Right ventricular systolic function is low normal. The right ventricular size is mildly enlarged.  3. The mitral valve is normal in structure. Moderate to severe mitral valve regurgitation.  4. Tricuspid valve regurgitation is moderate.  5. The aortic valve is normal in structure. Aortic valve regurgitation is not visualized. FINDINGS  Left Ventricle: Left ventricular ejection fraction, by estimation, is <20%.  through the popliteal and proximal calf veins. COMPARISON:  None FINDINGS: VENOUS RIGHT lower extremity: Normal compressibility of the common femoral, superficial femoral, and popliteal veins, as well as the visualized calf veins. Visualized portions of profunda femoral vein and great saphenous vein unremarkable. No filling defects to suggest DVT on grayscale or color Doppler imaging. Doppler waveforms show normal direction of venous flow, normal respiratory plasticity and response to augmentation. LEFT lower extremity: Small amount of nonocclusive thrombus identified within LEFT common femoral vein, with impaired compressibility. This extends to involve the saphenofemoral junction. Remaining deep venous system in the LEFT lower extremity is patent and compressible. Remaining  veins demonstrate spontaneous venous flow. Greater saphenous vein patent. OTHER N/A Limitations: None IMPRESSION: Nonocclusive thrombus within LEFT common femoral vein. Remaining deep venous system of the LEFT lower extremity is normal. No evidence of deep venous thrombosis in the RIGHT lower extremity. Electronically Signed   By: Ryan Southward M.D.   On: 02/16/2020 14:48   DG Chest Port 1 View  Result Date: 02/16/2020 CLINICAL DATA:  Presented to hospital with abdominal pain, abnormal CT and ultrasound exams with findings suggesting acute cholecystitis. Suspected sepsis EXAM: PORTABLE CHEST 1 VIEW COMPARISON:  Portable exam 0945 hours compared to 02/07/2020 FINDINGS: Enlargement of cardiac silhouette. Mediastinal contours and pulmonary vascularity normal. Lungs clear. No infiltrate, pleural effusion or pneumothorax. Levoconvex upper thoracic scoliosis. IMPRESSION: Enlargement of cardiac silhouette without acute infiltrate. Electronically Signed   By: Ryan Southward M.D.   On: 02/16/2020 10:00   ECHOCARDIOGRAM COMPLETE  Result Date: 02/17/2020    ECHOCARDIOGRAM REPORT   Patient Name:   Ryan Gibson Date of Exam: 02/16/2020 Medical Rec #:  [id number]           Height:       73.0 in Accession #:    [id number]          Weight:       245.0 lb Date of Birth:  May 08, 1975          BSA:          2.345 m Patient Age:    44 years            BP:           112/96 mmHg Patient Gender: M                   HR:           104 bpm. Exam Location:  [facility] Procedure: 2D Echo, Cardiac Doppler and Color Doppler Indications:     Pulmonary Embolus 415.19 / I26.99  History:         Patient has no prior history of Echocardiogram examinations.  Sonographer:     Neysa Bonito Roar Referring Phys:  [id number] AGBATA Diagnosing Phys: Arnoldo Hooker MD IMPRESSIONS  1. Left ventricular ejection fraction, by estimation, is <20%. The left ventricle has severely decreased function. The left ventricle demonstrates global hypokinesis. The left  ventricular internal cavity size was severely dilated. There is mild left ventricular hypertrophy. Left ventricular diastolic parameters are consistent with Grade I diastolic dysfunction (impaired relaxation).  2. Right ventricular systolic function is low normal. The right ventricular size is mildly enlarged.  3. The mitral valve is normal in structure. Moderate to severe mitral valve regurgitation.  4. Tricuspid valve regurgitation is moderate.  5. The aortic valve is normal in structure. Aortic valve regurgitation is not visualized. FINDINGS  Left Ventricle: Left ventricular ejection fraction, by estimation, is <20%.  Physician Discharge Summary  Ryan Gibson [name] DOB: [birth year] DOA: 02/16/2020  PCP: Ryan Singer, MD  Admit date: 02/16/2020 Discharge date: 02/22/2020  Admitted From: home Disposition: home   Recommendations for Outpatient Follow-up:  1. Follow up with PCP in 1 week & needs CMP to get LFTs checked 2. F/u cardio, Ryan Gibson, in 1 week 3. F/u general surgery, Ryan Gibson, in 1 week   Home Health: no Equipment/Devices:  Discharge Condition: stable  CODE STATUS: full  Diet recommendation: Heart Healthy  Brief/Interim Summary: HPI was taken from Dr. Joylene Gibson: Ryan Gibson is a 46 y.o. male with medical history significant for pain and swelling in his right leg for the last 1 month. He presents to the ER for evaluation 2 days of nausea and pain in the right upper quadrant and epigastrium as well as dry cough. Patient has a known history of gall bladder disease and was placed on oral antibiotic therapy and advised to follow up with surgery. He presents for evaluation of shortness of breath initially with exertion/activity but now short of breath at rest. He is unable to lay flat in bed. Shortness of breath is associated with pleuritic chest discomfort and bilateral leg swelling. He denies having any fever or chills, no dizziness or light headedness, no diarrhea, no constipation, no urinary symptoms, no headache or cough.  No extremity weakness, no blurry vision Labs shows sodium 133, potassium 4.1, chloride 98, Hco3 24, BUN 11, creatinine 1.00, calcium 8.7, alkaline phosphatase 62, albumin 3.7, AST 36, ALT 77, total protein 6.5, Total bilirubin 1.7, Lactic acid 2.6 >> 1.4, WBC 11, hemoglobin 15.5, HCT 47.6, MCV 93.9, RDW 15.9, PT 17.0, INR 1.4 Chest x ray reviewed by me shows enlargement of cardiac silhouette without acute infiltrate. Respiratory viral panel is negative CTA of the chest showed bilateral pulmonary emboli worst in the right lower lobe. No findings to  suggest right heart strain are noted. Bilateral patchy infiltrates likely related to the underlying emboli and may represent early sequelae of pulmonary infarct. CT scan of the abdomen and pelvis done on 02/11/20 shows nonspecific gallbladder wall thickening versus pericholecystic fluid. No additional findings to suggest acute cholecystitis. Right upper quadrant ultrasound is recommended for further evaluation. Small volume abdominopelvic ascites. Minor colonic diverticulosis Gall bladder ultrasound done on 02/11/20 showed gallbladder wall edema with pericholecystic free fluid and positive sonographic Murphy's sign. No gallstones or biliary dilatation visualized.Hepatic steatosis. No focal hepatic lesion 12 Lead EKG reviewed by me shows sinus tachycardia with multiple PVC's    ED Course: Patient is a 45 year old male who presents to the ER for evaluation of shortness of breath associated with bilateral lower extremity swelling as well as nausea, vomiting and RUQ pain. Patient has a known history of acute cholecystitis and was referred to surgery as an out patient. He had a CTA of the chest which shows bilateral pulmonary emboli worst in the right lower lobe. No findings to suggest right heart strain are noted. Bilateral patchy infiltrates likely related to the underlying emboli and may represent early sequelae of pulmonary infarct. Patient was started on heparin drip and has been seen in consultation by surgery. He received a dose of IV Zosyn in the ER and will be admitted to the hospital for further evaluation.  Hospital Course from Dr. Mayford Gibson 02/19/20-02/22/20: Pt was found to have b/l pulmonary emboli of unknown etiology. Pt was initially put on IV heparin drip and switched to eliquis priro to d/c. Also, pt  through the popliteal and proximal calf veins. COMPARISON:  None FINDINGS: VENOUS RIGHT lower extremity: Normal compressibility of the common femoral, superficial femoral, and popliteal veins, as well as the visualized calf veins. Visualized portions of profunda femoral vein and great saphenous vein unremarkable. No filling defects to suggest DVT on grayscale or color Doppler imaging. Doppler waveforms show normal direction of venous flow, normal respiratory plasticity and response to augmentation. LEFT lower extremity: Small amount of nonocclusive thrombus identified within LEFT common femoral vein, with impaired compressibility. This extends to involve the saphenofemoral junction. Remaining deep venous system in the LEFT lower extremity is patent and compressible. Remaining  veins demonstrate spontaneous venous flow. Greater saphenous vein patent. OTHER N/A Limitations: None IMPRESSION: Nonocclusive thrombus within LEFT common femoral vein. Remaining deep venous system of the LEFT lower extremity is normal. No evidence of deep venous thrombosis in the RIGHT lower extremity. Electronically Signed   By: Ryan Southward M.D.   On: 02/16/2020 14:48   DG Chest Port 1 View  Result Date: 02/16/2020 CLINICAL DATA:  Presented to hospital with abdominal pain, abnormal CT and ultrasound exams with findings suggesting acute cholecystitis. Suspected sepsis EXAM: PORTABLE CHEST 1 VIEW COMPARISON:  Portable exam 0945 hours compared to 02/07/2020 FINDINGS: Enlargement of cardiac silhouette. Mediastinal contours and pulmonary vascularity normal. Lungs clear. No infiltrate, pleural effusion or pneumothorax. Levoconvex upper thoracic scoliosis. IMPRESSION: Enlargement of cardiac silhouette without acute infiltrate. Electronically Signed   By: Ryan Southward M.D.   On: 02/16/2020 10:00   ECHOCARDIOGRAM COMPLETE  Result Date: 02/17/2020    ECHOCARDIOGRAM REPORT   Patient Name:   Ryan Gibson Date of Exam: 02/16/2020 Medical Rec #:  [id number]           Height:       73.0 in Accession #:    [id number]          Weight:       245.0 lb Date of Birth:  May 08, 1975          BSA:          2.345 m Patient Age:    44 years            BP:           112/96 mmHg Patient Gender: M                   HR:           104 bpm. Exam Location:  [facility] Procedure: 2D Echo, Cardiac Doppler and Color Doppler Indications:     Pulmonary Embolus 415.19 / I26.99  History:         Patient has no prior history of Echocardiogram examinations.  Sonographer:     Neysa Bonito Roar Referring Phys:  [id number] AGBATA Diagnosing Phys: Arnoldo Hooker MD IMPRESSIONS  1. Left ventricular ejection fraction, by estimation, is <20%. The left ventricle has severely decreased function. The left ventricle demonstrates global hypokinesis. The left  ventricular internal cavity size was severely dilated. There is mild left ventricular hypertrophy. Left ventricular diastolic parameters are consistent with Grade I diastolic dysfunction (impaired relaxation).  2. Right ventricular systolic function is low normal. The right ventricular size is mildly enlarged.  3. The mitral valve is normal in structure. Moderate to severe mitral valve regurgitation.  4. Tricuspid valve regurgitation is moderate.  5. The aortic valve is normal in structure. Aortic valve regurgitation is not visualized. FINDINGS  Left Ventricle: Left ventricular ejection fraction, by estimation, is <20%.

## 2020-02-22 NOTE — TOC Progression Note (Signed)
Transition of Care [facility]) - Progression Note    Patient Details  Name: Ryan Gibson MRN: [id number] Date of Birth: October 22, 1975  [facility]) CM/SW Contact  Bing Quarry, RN Phone Number: 02/22/2020, 10:51 AM  Clinical Narrative:    02/22/20 1050 Patient has discharge orders in for today. Patient has privae [employer] PPO insurance, patient contacted to make sure he was comfortable with coverage for discharge prescription for Eliquis. Co- pay card given to patient to ensure Holiday access to medication for PE on discharge. Gabriel Cirri RN CM       Expected Discharge Plan and Services           Expected Discharge Date: 02/22/20                                     Social Determinants of Health (SDOH) Interventions    Readmission Risk Interventions No flowsheet data found.

## 2020-02-24 ENCOUNTER — Telehealth (INDEPENDENT_AMBULATORY_CARE_PROVIDER_SITE_OTHER): Payer: Self-pay

## 2020-02-24 ENCOUNTER — Other Ambulatory Visit (INDEPENDENT_AMBULATORY_CARE_PROVIDER_SITE_OTHER): Payer: Self-pay | Admitting: Internal Medicine

## 2020-02-24 ENCOUNTER — Other Ambulatory Visit (INDEPENDENT_AMBULATORY_CARE_PROVIDER_SITE_OTHER): Payer: Self-pay | Admitting: Nurse Practitioner

## 2020-02-24 ENCOUNTER — Ambulatory Visit (HOSPITAL_COMMUNITY): Payer: BC Managed Care – PPO

## 2020-02-24 DIAGNOSIS — I2699 Other pulmonary embolism without acute cor pulmonale: Secondary | ICD-10-CM

## 2020-02-24 MED ORDER — APIXABAN 5 MG PO TABS: 5.0000 mg | ORAL_TABLET | Freq: Two times a day (BID) | ORAL | 2 refills | Status: DC

## 2020-02-24 MED ORDER — APIXABAN 5 MG PO TABS: 10.0000 mg | ORAL_TABLET | Freq: Two times a day (BID) | ORAL | 0 refills | Status: DC

## 2020-02-24 NOTE — Telephone Encounter (Signed)
Sending this for documentation purposes. Wanted to verify that you were able to complete the prior authorization and that you will call patient once we have received approval so he knows to pick up prescription from pharmacy?

## 2020-02-24 NOTE — Telephone Encounter (Signed)
Auth approved: [id number]. Faxing now to [employer]  & calling patient to notify as well.

## 2020-02-24 NOTE — Telephone Encounter (Signed)
Thank you for all of your help.   

## 2020-02-25 ENCOUNTER — Telehealth (INDEPENDENT_AMBULATORY_CARE_PROVIDER_SITE_OTHER): Payer: Self-pay

## 2020-02-25 DIAGNOSIS — R079 Chest pain, unspecified: Secondary | ICD-10-CM | POA: Diagnosis not present

## 2020-02-25 DIAGNOSIS — I2699 Other pulmonary embolism without acute cor pulmonale: Secondary | ICD-10-CM | POA: Diagnosis not present

## 2020-02-25 DIAGNOSIS — I471 Supraventricular tachycardia: Secondary | ICD-10-CM | POA: Diagnosis not present

## 2020-02-25 DIAGNOSIS — I509 Heart failure, unspecified: Secondary | ICD-10-CM | POA: Diagnosis not present

## 2020-02-25 NOTE — Telephone Encounter (Signed)
Thank you for all of your help and for following up on him.

## 2020-02-25 NOTE — Telephone Encounter (Signed)
Pt has new provider

## 2020-02-26 ENCOUNTER — Ambulatory Visit (HOSPITAL_COMMUNITY): Payer: BC Managed Care – PPO | Admitting: Physical Therapy

## 2020-02-28 ENCOUNTER — Encounter (HOSPITAL_COMMUNITY): Payer: BC Managed Care – PPO

## 2020-03-02 ENCOUNTER — Ambulatory Visit (HOSPITAL_COMMUNITY): Payer: BC Managed Care – PPO

## 2020-03-02 ENCOUNTER — Other Ambulatory Visit (INDEPENDENT_AMBULATORY_CARE_PROVIDER_SITE_OTHER): Payer: Self-pay | Admitting: Internal Medicine

## 2020-03-02 ENCOUNTER — Encounter (INDEPENDENT_AMBULATORY_CARE_PROVIDER_SITE_OTHER): Payer: Self-pay | Admitting: Internal Medicine

## 2020-03-02 ENCOUNTER — Other Ambulatory Visit: Payer: Self-pay

## 2020-03-02 ENCOUNTER — Telehealth (INDEPENDENT_AMBULATORY_CARE_PROVIDER_SITE_OTHER): Payer: Self-pay

## 2020-03-02 ENCOUNTER — Ambulatory Visit (INDEPENDENT_AMBULATORY_CARE_PROVIDER_SITE_OTHER): Payer: BC Managed Care – PPO | Admitting: Internal Medicine

## 2020-03-02 VITALS — BP 116/68 | HR 102 | Temp 96.6°F | Ht 73.5 in | Wt 246.0 lb

## 2020-03-02 DIAGNOSIS — I2699 Other pulmonary embolism without acute cor pulmonale: Secondary | ICD-10-CM

## 2020-03-02 DIAGNOSIS — I5023 Acute on chronic systolic (congestive) heart failure: Secondary | ICD-10-CM | POA: Diagnosis not present

## 2020-03-02 NOTE — Progress Notes (Signed)
Metrics: Intervention Frequency ACO  Documented Smoking Status Yearly  Screened one or more times in 24 months  Cessation Counseling or  Active cessation medication Past 24 months  Past 24 months   Guideline developer: UpToDate (See UpToDate for funding source) Date Released: 2014       Wellness Office Visit  Subjective:  Patient ID: Ryan Gibson, male    DOB: Jun 11, 1975  Age: 45 y.o. MRN: [id number]  CC: This man comes in posthospitalization. HPI  When I saw him, he felt that his right upper quadrant was tender and ultrasound did show abnormal gallbladder.  He did not have hepatomegaly as I suspected. However before he got to see a surgeon, I started him on oral antibiotics.  Despite this, he became sicker and ended up in the hospital with a diagnosis of multiple pulmonary emboli and also systolic congestive heart failure in addition to the diseased gallbladder for which she had procedure.  He did not have a full cholecystectomy in view of his pulmonary and cardiac status and presence of anticoagulation. Post hospitalization now he requires Eliquis prescription which will need preauthorization again. He is due to follow-up with cardiology regarding his very low ejection fraction of less than 20%. Past Medical History:  Diagnosis Date  . Asthma    AS A CHILD ONLY  . Foot pain, right    painful x 1 month and swelling   Past Surgical History:  Procedure Laterality Date  . HERNIA REPAIR  07/13/2011   RIH  . INGUINAL HERNIA REPAIR  07/13/2011   Procedure: HERNIA REPAIR INGUINAL ADULT;  Surgeon: Wilmon Arms. Corliss Skains, MD;  Location: [facility];  Service: General;  Laterality: Right;  . NO PREVIOUS SURGERY       Family History  Problem Relation Age of Onset  . Healthy Mother   . Heart disease Father     Social History   Social History Narrative   Married for 13 years.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   Social History   Tobacco Use  .  Smoking status: Never Smoker  . Smokeless tobacco: Current User    Types: Chew  Substance Use Topics  . Alcohol use: Yes    Comment: Social    Current Meds  Medication Sig  . albuterol (VENTOLIN HFA) 108 (90 Base) MCG/ACT inhaler Inhale 1-2 puffs into the lungs every 6 (six) hours as needed for wheezing or shortness of breath.  Marland Kitchen apixaban (ELIQUIS) 5 MG TABS tablet Take 2 tablets (10 mg total) by mouth 2 (two) times daily for 7 days.  Marland Kitchen apixaban (ELIQUIS) 5 MG TABS tablet Take 1 tablet (5 mg total) by mouth 2 (two) times daily. Only start this script after taking eliquis 10mg  BID x 6 days  . benzonatate (TESSALON) 200 MG capsule Take 200 mg by mouth 3 (three) times daily as needed.  Marland Kitchen dexamethasone (DECADRON) 2 MG tablet Take by mouth.  . folic acid (FOLVITE) 1 MG tablet Take 1 mg by mouth 2 (two) times daily.  . furosemide (LASIX) 40 MG tablet Take 1 tablet (40 mg total) by mouth daily.  Marland Kitchen HYDROcodone-acetaminophen (NORCO) 10-325 MG tablet Take 1 tablet by mouth every 6 (six) hours as needed.  . hydroxyurea (HYDREA) 500 MG capsule Take 1,000 mg by mouth daily.  . metoprolol succinate (TOPROL-XL) 25 MG 24 hr tablet Take 1 tablet (25 mg total) by mouth daily.  . potassium chloride (KLOR-CON) 10 MEQ tablet Take 1 tablet (10 mEq total) by  mouth daily.  . sacubitril-valsartan (ENTRESTO) 24-26 MG Take 1 tablet by mouth 2 (two) times daily.  . [DISCONTINUED] azithromycin (ZITHROMAX) 250 MG tablet Take 250 mg by mouth as directed.      Depression screen [facility] 2/9 02/10/2020 03/07/2018  Decreased Interest 0 1  Down, Depressed, Hopeless 0 1  PHQ - 2 Score 0 2  Altered sleeping 0 0  Tired, decreased energy 0 1  Change in appetite 0 0  Feeling bad or failure about yourself  0 1  Trouble concentrating 0 0  Moving slowly or fidgety/restless 0 0  Suicidal thoughts 0 0  PHQ-9 Score 0 4  Difficult doing work/chores Not difficult at all Not difficult at all     Objective:   Today's Vitals:  BP 116/68   Pulse (!) 102   Temp (!) 96.6 F (35.9 C) (Temporal)   Ht 6' 1.5" (1.867 m)   Wt 246 lb (111.6 kg)   SpO2 98%   BMI 32.02 kg/m  Vitals with BMI 03/02/2020 02/22/2020 02/22/2020  Height 6' 1.5" - -  Weight 246 lbs - -  BMI 32.01 - -  Systolic 116 113 161  Diastolic 68 66 77  Pulse 102 97 88     Physical Exam  He looks somewhat sicker than I had seen him on the previous visit.  Blood pressure thankfully is in a good range.     Assessment   1. Acute pulmonary embolism, unspecified pulmonary embolism type, unspecified whether acute cor pulmonale present (HCC)   2. Acute on chronic systolic congestive heart failure (HCC)       Tests ordered No orders of the defined types were placed in this encounter.    Plan: 1. He will continue with all medications that have been started on in the hospital. 2. We will try to get his Eliquis approved by the insurance company. 3. His work is quite physical and he will need to be on light duty.  He plans to return to work next week so I have given him a note to be on light duty from next Thursday until early May. 4. He will follow-up with me in the next 3 months or so.   No orders of the defined types were placed in this encounter.   Wilson Singer, MD

## 2020-03-03 ENCOUNTER — Encounter (INDEPENDENT_AMBULATORY_CARE_PROVIDER_SITE_OTHER): Payer: Self-pay | Admitting: Internal Medicine

## 2020-03-03 ENCOUNTER — Telehealth (INDEPENDENT_AMBULATORY_CARE_PROVIDER_SITE_OTHER): Payer: Self-pay

## 2020-03-03 ENCOUNTER — Ambulatory Visit: Payer: BC Managed Care – PPO | Admitting: Family

## 2020-03-03 NOTE — Telephone Encounter (Signed)
Patient called and stated that he spoke to his case manager with [employer] and they are faxing over paperwork that will need the dates the patient was in the hospital and patient wants to make sure we receive the paperwork and he is so thankful for all that our office does and really appreciates everyone helping him and caring.  Sending as Lorain Childes to keep an eye out for fax coming to complete.

## 2020-03-03 NOTE — Telephone Encounter (Signed)
I have the information in file for Dr Karilyn Cota

## 2020-03-04 ENCOUNTER — Encounter (HOSPITAL_COMMUNITY): Payer: BC Managed Care – PPO | Admitting: Physical Therapy

## 2020-03-05 ENCOUNTER — Other Ambulatory Visit (INDEPENDENT_AMBULATORY_CARE_PROVIDER_SITE_OTHER): Payer: Self-pay | Admitting: Internal Medicine

## 2020-03-05 DIAGNOSIS — I5023 Acute on chronic systolic (congestive) heart failure: Secondary | ICD-10-CM

## 2020-03-05 DIAGNOSIS — I2699 Other pulmonary embolism without acute cor pulmonale: Secondary | ICD-10-CM

## 2020-03-06 ENCOUNTER — Encounter (HOSPITAL_COMMUNITY): Payer: BC Managed Care – PPO

## 2020-03-09 ENCOUNTER — Encounter (HOSPITAL_COMMUNITY): Payer: BC Managed Care – PPO | Admitting: Physical Therapy

## 2020-03-11 ENCOUNTER — Encounter (HOSPITAL_COMMUNITY): Payer: BC Managed Care – PPO | Admitting: Physical Therapy

## 2020-03-12 ENCOUNTER — Encounter (HOSPITAL_COMMUNITY): Payer: BC Managed Care – PPO

## 2020-03-16 ENCOUNTER — Encounter (HOSPITAL_COMMUNITY): Payer: BC Managed Care – PPO

## 2020-03-17 DIAGNOSIS — I509 Heart failure, unspecified: Secondary | ICD-10-CM | POA: Diagnosis not present

## 2020-03-17 DIAGNOSIS — R0602 Shortness of breath: Secondary | ICD-10-CM | POA: Diagnosis not present

## 2020-03-17 DIAGNOSIS — R079 Chest pain, unspecified: Secondary | ICD-10-CM | POA: Diagnosis not present

## 2020-03-17 DIAGNOSIS — I420 Dilated cardiomyopathy: Secondary | ICD-10-CM | POA: Diagnosis not present

## 2020-03-18 ENCOUNTER — Encounter (HOSPITAL_COMMUNITY): Payer: BC Managed Care – PPO | Admitting: Physical Therapy

## 2020-03-20 ENCOUNTER — Encounter (HOSPITAL_COMMUNITY): Payer: BC Managed Care – PPO

## 2020-03-23 ENCOUNTER — Encounter (HOSPITAL_COMMUNITY): Payer: BC Managed Care – PPO

## 2020-03-23 DIAGNOSIS — M2550 Pain in unspecified joint: Secondary | ICD-10-CM | POA: Diagnosis not present

## 2020-03-23 DIAGNOSIS — M1A09X0 Idiopathic chronic gout, multiple sites, without tophus (tophi): Secondary | ICD-10-CM | POA: Diagnosis not present

## 2020-03-23 DIAGNOSIS — R7989 Other specified abnormal findings of blood chemistry: Secondary | ICD-10-CM | POA: Diagnosis not present

## 2020-03-25 ENCOUNTER — Encounter (HOSPITAL_COMMUNITY): Payer: BC Managed Care – PPO | Admitting: Physical Therapy

## 2020-03-25 DIAGNOSIS — R079 Chest pain, unspecified: Secondary | ICD-10-CM | POA: Diagnosis not present

## 2020-03-27 ENCOUNTER — Encounter (HOSPITAL_COMMUNITY): Payer: BC Managed Care – PPO

## 2020-03-30 ENCOUNTER — Encounter (HOSPITAL_COMMUNITY): Payer: BC Managed Care – PPO

## 2020-03-30 ENCOUNTER — Ambulatory Visit (INDEPENDENT_AMBULATORY_CARE_PROVIDER_SITE_OTHER): Payer: BC Managed Care – PPO | Admitting: Internal Medicine

## 2020-04-01 ENCOUNTER — Encounter (HOSPITAL_COMMUNITY): Payer: BC Managed Care – PPO | Admitting: Physical Therapy

## 2020-04-01 DIAGNOSIS — R002 Palpitations: Secondary | ICD-10-CM | POA: Diagnosis not present

## 2020-04-01 DIAGNOSIS — I5021 Acute systolic (congestive) heart failure: Secondary | ICD-10-CM | POA: Diagnosis not present

## 2020-04-01 DIAGNOSIS — R072 Precordial pain: Secondary | ICD-10-CM | POA: Diagnosis not present

## 2020-04-01 DIAGNOSIS — I471 Supraventricular tachycardia: Secondary | ICD-10-CM | POA: Diagnosis not present

## 2020-04-03 ENCOUNTER — Encounter (HOSPITAL_COMMUNITY): Payer: BC Managed Care – PPO

## 2020-04-06 ENCOUNTER — Telehealth: Payer: Self-pay | Admitting: Cardiology

## 2020-04-06 ENCOUNTER — Encounter (INDEPENDENT_AMBULATORY_CARE_PROVIDER_SITE_OTHER): Payer: Self-pay | Admitting: Internal Medicine

## 2020-04-06 NOTE — Telephone Encounter (Signed)
Hey what is this patient being seen for?

## 2020-04-13 DIAGNOSIS — R002 Palpitations: Secondary | ICD-10-CM | POA: Diagnosis not present

## 2020-04-13 DIAGNOSIS — R072 Precordial pain: Secondary | ICD-10-CM | POA: Diagnosis not present

## 2020-04-17 ENCOUNTER — Ambulatory Visit: Payer: Self-pay | Admitting: Cardiology

## 2020-04-22 DIAGNOSIS — I10 Essential (primary) hypertension: Secondary | ICD-10-CM | POA: Diagnosis not present

## 2020-04-22 DIAGNOSIS — E785 Hyperlipidemia, unspecified: Secondary | ICD-10-CM | POA: Diagnosis not present

## 2020-04-22 DIAGNOSIS — I2510 Atherosclerotic heart disease of native coronary artery without angina pectoris: Secondary | ICD-10-CM | POA: Diagnosis not present

## 2020-04-22 DIAGNOSIS — I5022 Chronic systolic (congestive) heart failure: Secondary | ICD-10-CM | POA: Diagnosis not present

## 2020-04-23 DIAGNOSIS — R002 Palpitations: Secondary | ICD-10-CM | POA: Diagnosis not present

## 2020-05-18 DIAGNOSIS — I5022 Chronic systolic (congestive) heart failure: Secondary | ICD-10-CM | POA: Diagnosis not present

## 2020-05-26 ENCOUNTER — Encounter (INDEPENDENT_AMBULATORY_CARE_PROVIDER_SITE_OTHER): Payer: Self-pay

## 2020-05-29 DIAGNOSIS — M1A09X0 Idiopathic chronic gout, multiple sites, without tophus (tophi): Secondary | ICD-10-CM | POA: Diagnosis not present

## 2020-05-29 DIAGNOSIS — R7989 Other specified abnormal findings of blood chemistry: Secondary | ICD-10-CM | POA: Diagnosis not present

## 2020-05-29 DIAGNOSIS — M2550 Pain in unspecified joint: Secondary | ICD-10-CM | POA: Diagnosis not present

## 2020-06-01 ENCOUNTER — Telehealth (INDEPENDENT_AMBULATORY_CARE_PROVIDER_SITE_OTHER): Payer: Self-pay

## 2020-06-01 NOTE — Telephone Encounter (Signed)
Cardiologist taken him out 3 months. Will need a new note from [facility] stating as well. Due to his EF is only at 20%. Pt was asked to have Cardilogist to CC Dr Karilyn Cota the information and how long he will be out of work.  Ask him to have the Cardiologist to write the letter until DR Karilyn Cota returns on 20 th. Then pt can be addressed on next OV .

## 2020-06-02 DIAGNOSIS — Z86711 Personal history of pulmonary embolism: Secondary | ICD-10-CM | POA: Diagnosis not present

## 2020-06-02 DIAGNOSIS — I5022 Chronic systolic (congestive) heart failure: Secondary | ICD-10-CM | POA: Diagnosis not present

## 2020-06-02 DIAGNOSIS — I426 Alcoholic cardiomyopathy: Secondary | ICD-10-CM | POA: Diagnosis not present

## 2020-06-02 DIAGNOSIS — I2510 Atherosclerotic heart disease of native coronary artery without angina pectoris: Secondary | ICD-10-CM | POA: Diagnosis not present

## 2020-06-09 ENCOUNTER — Ambulatory Visit (INDEPENDENT_AMBULATORY_CARE_PROVIDER_SITE_OTHER): Payer: BC Managed Care – PPO | Admitting: Internal Medicine

## 2020-07-01 ENCOUNTER — Other Ambulatory Visit: Payer: Self-pay

## 2020-07-01 ENCOUNTER — Encounter (INDEPENDENT_AMBULATORY_CARE_PROVIDER_SITE_OTHER): Payer: Self-pay | Admitting: Internal Medicine

## 2020-07-01 ENCOUNTER — Ambulatory Visit (INDEPENDENT_AMBULATORY_CARE_PROVIDER_SITE_OTHER): Payer: BC Managed Care – PPO | Admitting: Internal Medicine

## 2020-07-01 ENCOUNTER — Telehealth (INDEPENDENT_AMBULATORY_CARE_PROVIDER_SITE_OTHER): Payer: Self-pay

## 2020-07-01 VITALS — BP 136/92 | HR 92 | Temp 97.3°F | Ht 73.5 in | Wt 240.0 lb

## 2020-07-01 DIAGNOSIS — I5023 Acute on chronic systolic (congestive) heart failure: Secondary | ICD-10-CM

## 2020-07-01 NOTE — Progress Notes (Addendum)
Metrics: Intervention Frequency ACO  Documented Smoking Status Yearly  Screened one or more times in 24 months  Cessation Counseling or  Active cessation medication Past 24 months  Past 24 months   Guideline developer: UpToDate (See UpToDate for funding source) Date Released: 2014       Wellness Office Visit  Subjective:  Patient ID: Ryan Gibson, male    DOB: 10-Sep-1975  Age: 45 y.o. MRN: [id number]  CC: I walked into the exam room with this patient who had not properly wore his mask as he was asked to do.  I asked him to put the mask on properly.  I explained to him the importance of wearing a mask correctly but he told me he had asthma.  I told him that his oxygen his levels would not drop if he wore the mask correctly.  He did not want seem to comply so I asked him that he could leave the office.  The patient will not be charged for today's visit. HPI   Past Medical History:  Diagnosis Date  . Asthma    AS A CHILD ONLY  . Foot pain, right    painful x 1 month and swelling   Past Surgical History:  Procedure Laterality Date  . HERNIA REPAIR  07/13/2011   RIH  . INGUINAL HERNIA REPAIR  07/13/2011   Procedure: HERNIA REPAIR INGUINAL ADULT;  Surgeon: Wilmon Arms. Corliss Skains, MD;  Location: [facility];  Service: General;  Laterality: Right;  . NO PREVIOUS SURGERY       Family History  Problem Relation Age of Onset  . Healthy Mother   . Heart disease Father     Social History   Social History Narrative   Married for 13 years.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   Social History   Tobacco Use  . Smoking status: Never Smoker  . Smokeless tobacco: Current User    Types: Chew  Substance Use Topics  . Alcohol use: Yes    Comment: Social    Current Meds  Medication Sig  . albuterol (VENTOLIN HFA) 108 (90 Base) MCG/ACT inhaler Inhale 1-2 puffs into the lungs every 6 (six) hours as needed for wheezing or shortness of breath.  . benzonatate  (TESSALON) 200 MG capsule Take 200 mg by mouth 3 (three) times daily as needed.  Marland Kitchen dexamethasone (DECADRON) 2 MG tablet Take by mouth.  Sherryll Burger 24-26 MG Take 1 tablet by mouth 2 (two) times daily.  Marland Kitchen ENTRESTO 49-51 MG Take 1 tablet by mouth daily.  . folic acid (FOLVITE) 1 MG tablet Take 1 mg by mouth 2 (two) times daily.  Marland Kitchen HYDROcodone-acetaminophen (NORCO) 10-325 MG tablet Take 1 tablet by mouth every 6 (six) hours as needed.  Marland Kitchen HYDROcodone-acetaminophen (NORCO) 7.5-325 MG tablet Take 1 tablet by mouth every 6 (six) hours as needed. NEED FOR RA & GOUT. FLAW UP  UNTIL HE GET IN TO SEE HER IN 2 MTHS.  . hydroxyurea (HYDREA) 500 MG capsule Take 1,000 mg by mouth daily.  . metoprolol succinate (TOPROL-XL) 100 MG 24 hr tablet Take 100 mg by mouth daily.  . metoprolol tartrate (LOPRESSOR) 50 MG tablet Take 100 mg by mouth daily.  Marland Kitchen omeprazole (PRILOSEC) 40 MG capsule Take 40 mg by mouth daily.  . rosuvastatin (CRESTOR) 20 MG tablet Take 20 mg by mouth daily.  . sertraline (ZOLOFT) 50 MG tablet Take 1 tablet by mouth daily.  . VERQUVO 10 MG TABS Take 1 tablet  by mouth daily.     Flowsheet Row Office Visit from 02/10/2020 in [facility]  PHQ-9 Total Score 0      Objective:   Today's Vitals: BP (!) 136/92 (BP Location: Left Arm, Patient Position: Sitting, Cuff Size: Normal)   Pulse 92   Temp (!) 97.3 F (36.3 C) (Temporal)   Ht 6' 1.5" (1.867 m)   Wt 240 lb (108.9 kg)   SpO2 98%   BMI 31.23 kg/m  Vitals with BMI 07/01/2020 03/02/2020 02/22/2020  Height 6' 1.5" 6' 1.5" -  Weight 240 lbs 246 lbs -  BMI 31.23 32.01 -  Systolic 136 116 161  Diastolic 92 68 66  Pulse 92 102 97     Physical Exam       Assessment   No diagnosis found.    Tests ordered No orders of the defined types were placed in this encounter.    Plan: 1. Subsequent to this patient's visit, I have discussed the patient's behavior with Nellie, RMA (who was the medical assistant responsible  for taking the patient from the waiting room and rooming the patient), who described the events leading up to the patient being roomed.  Based on these discussions and her documentation, I do not feel that the patient-provider relationship can continue.  Therefore, I will dismiss him from the practice.   No orders of the defined types were placed in this encounter.   Wilson Singer, MD

## 2020-07-01 NOTE — Telephone Encounter (Signed)
When patient came into office he had no mask. One was given in the beginning of check-in.  Pt stated" are we really dong this?" I stated to him in [facility] building or offices has a policy in place. Visitors, patients, & [facility] employees will have to wear a mask while on there premises.  Pt did put the mask on but bundle under chin, ask pt to pull mask up as directed to be seen. Pt made jokes and conversations about the pandemic is over and not real. Next pt was to be check-in process to sign AOB for visit. Pt was ask to be seated and I would get him back to be seen by Dr Karilyn Cota when a room comes open. Pt was early to the appointment at least 15 mins or greater.   When to triage patient, he was in waiting area with mask not on. Asked him to put the mask on and we have to follow the rules of Dr.Gosrani clinic. He was very happy, loud that it is not real and not nesscary. I told him please to wear mask or it make cause him to be dismissed by provider. I do not want any trouble today for not wearing a mask & following directions.Then patient followed instructions. So, I  taken weight , went to Room 2 to complete triage process. Pt got into room and did the same thing when it was time for him to look at phone to verify medications. Pt said to me ,"hey I got to breath I am gonna take off I ain't contagious!"I just have a had time breathing. So, new changes in provider care needs for Cardiology for  The paper work from Dr. Theron Arista Parker,MD. He is no longer his provider pt stated. He quit , unknown what happen pt stated. So now he will have to wait 2 -3 months to get into see a new provider. Asked him who was the provider? He did not know , so I looked up in appt . He will be new appt to Dr Jacinto Halim -cardiologist in [place] soon.   But until l he get the this appt he has to have help with his papers for work from all his post care for heart conditions. Stated the Dr.Parker said his heart  EF:  20, and now he is up  to EF: 26-27 now. But still not great,but will need more care. Rehab at home walking 3 x wk to go back to work. But he keeps getting very SOB & fatigue. Feels like he has to have somewhere to lean on something. Pt said if he bends over to touch his toes/ tie shone and stands back up he get a vertigo type feeling and like he is going to pass out.  Then Dr Karilyn Cota walk in to start his visit with pt. Greeting was exchange . Dr Karilyn Cota ask him to pull his mask up in his office please. I Looked to pt and stated I been telling you he will get tell you to put the mask on. He kept playing it down to the about mask & not adhering to his verbal order. This showed more non-compliance of the patient to Dr Karilyn Cota for OV today. I exited room. To go help Nurse Jiles Prows, NP for a exam visit.

## 2020-07-02 ENCOUNTER — Other Ambulatory Visit (INDEPENDENT_AMBULATORY_CARE_PROVIDER_SITE_OTHER): Payer: Self-pay | Admitting: Internal Medicine

## 2020-07-06 DIAGNOSIS — I110 Hypertensive heart disease with heart failure: Secondary | ICD-10-CM | POA: Diagnosis not present

## 2020-07-06 DIAGNOSIS — Z7182 Exercise counseling: Secondary | ICD-10-CM | POA: Diagnosis not present

## 2020-07-06 DIAGNOSIS — Z713 Dietary counseling and surveillance: Secondary | ICD-10-CM | POA: Diagnosis not present

## 2020-07-06 DIAGNOSIS — I5020 Unspecified systolic (congestive) heart failure: Secondary | ICD-10-CM | POA: Diagnosis not present

## 2020-07-06 DIAGNOSIS — Z6830 Body mass index (BMI) 30.0-30.9, adult: Secondary | ICD-10-CM | POA: Diagnosis not present

## 2020-07-06 DIAGNOSIS — Z0001 Encounter for general adult medical examination with abnormal findings: Secondary | ICD-10-CM | POA: Diagnosis not present

## 2020-07-16 ENCOUNTER — Encounter: Payer: Self-pay | Admitting: Cardiology

## 2020-07-16 ENCOUNTER — Telehealth (INDEPENDENT_AMBULATORY_CARE_PROVIDER_SITE_OTHER): Payer: Self-pay

## 2020-07-16 ENCOUNTER — Ambulatory Visit: Payer: BC Managed Care – PPO | Admitting: Cardiology

## 2020-07-16 ENCOUNTER — Other Ambulatory Visit: Payer: Self-pay

## 2020-07-16 VITALS — BP 140/89 | HR 81 | Temp 98.0°F | Resp 17 | Ht 73.5 in | Wt 236.8 lb

## 2020-07-16 DIAGNOSIS — I420 Dilated cardiomyopathy: Secondary | ICD-10-CM

## 2020-07-16 DIAGNOSIS — I10 Essential (primary) hypertension: Secondary | ICD-10-CM

## 2020-07-16 DIAGNOSIS — Z86711 Personal history of pulmonary embolism: Secondary | ICD-10-CM

## 2020-07-16 DIAGNOSIS — E782 Mixed hyperlipidemia: Secondary | ICD-10-CM | POA: Diagnosis not present

## 2020-07-16 DIAGNOSIS — I5022 Chronic systolic (congestive) heart failure: Secondary | ICD-10-CM | POA: Diagnosis not present

## 2020-07-16 DIAGNOSIS — Z8249 Family history of ischemic heart disease and other diseases of the circulatory system: Secondary | ICD-10-CM

## 2020-07-16 DIAGNOSIS — Z720 Tobacco use: Secondary | ICD-10-CM | POA: Diagnosis not present

## 2020-07-16 DIAGNOSIS — F17210 Nicotine dependence, cigarettes, uncomplicated: Secondary | ICD-10-CM | POA: Diagnosis not present

## 2020-07-16 DIAGNOSIS — F17298 Nicotine dependence, other tobacco product, with other nicotine-induced disorders: Secondary | ICD-10-CM

## 2020-07-16 MED ORDER — ENTRESTO 49-51 MG PO TABS: 1.0000 | ORAL_TABLET | Freq: Two times a day (BID) | ORAL | 1 refills | Status: DC

## 2020-07-16 NOTE — Progress Notes (Signed)
Primary Physician/Referring:  No primary care provider on file.  Patient ID: Ryan Gibson, male    DOB: 20-Sep-1975, 45 y.o.   MRN: [id number]  Chief Complaint  Patient presents with  . New Patient (Initial Visit)  . Congestive Heart Failure   HPI:    Ryan Gibson  is a 45 y.o. Caucasian male patient with hypertension, tobacco use disorder, alcohol abuse, strong family history of premature coronary disease in his father who had coronary artery disease in his late 45s, who is unvaccinated for COVID-19 and appears to have symptoms which are very mild sometime in October 2021 as his whole family had COVID-19 infection.  He presented to [facility] on 02/16/2020 with right leg swelling and edema and pain, marked dyspnea on exertion and also worsened to a point of shortness of breath at rest, orthopnea, and was diagnosed with acute bilateral pulmonary emboli and DVT of the right lower extremity and severe dilated cardiomyopathy with EF <20%.  He was also found to have acute cholecystitis due to gallstones.  He was eventually discharged home, presently still has not returned to work due to dizziness when he stands up and generalized weakness.  But overall states that he is doing well and has not had any further dyspnea on exertion, denies any chest pain or symptoms of claudication.  Past Medical History:  Diagnosis Date  . Asthma    AS A CHILD ONLY  . Foot pain, right    painful x 1 month and swelling   Past Surgical History:  Procedure Laterality Date  . HERNIA REPAIR  07/13/2011   RIH  . INGUINAL HERNIA REPAIR  07/13/2011   Procedure: HERNIA REPAIR INGUINAL ADULT;  Surgeon: Wilmon Arms. Corliss Skains, MD;  Location: [facility];  Service: General;  Laterality: Right;  . NO PREVIOUS SURGERY     Family History  Problem Relation Age of Onset  . Healthy Mother   . Heart disease Father     Social History   Tobacco Use  . Smoking status: Never Smoker  . Smokeless tobacco:  Current User    Types: Chew  Substance Use Topics  . Alcohol use: Yes    Comment: Social   Marital Status: Married  ROS  Review of Systems  Cardiovascular: Negative for chest pain, dyspnea on exertion and leg swelling.  Gastrointestinal: Negative for melena.  Neurological: Positive for dizziness.   Objective  Blood pressure 140/89, pulse 81, temperature 98 F (36.7 C), temperature source Temporal, resp. rate 17, height 6' 1.5" (1.867 m), weight 236 lb 12.8 oz (107.4 kg), SpO2 96 %. Body mass index is 30.82 kg/m.  Vitals with BMI 07/16/2020 07/01/2020 03/02/2020  Height 6' 1.5" 6' 1.5" 6' 1.5"  Weight 236 lbs 13 oz 240 lbs 246 lbs  BMI 30.82 31.23 32.01  Systolic 140 136 865  Diastolic 89 92 68  Pulse 81 92 102     Physical Exam Constitutional:      Appearance: He is obese.  Neck:     Vascular: No carotid bruit or JVD.  Cardiovascular:     Rate and Rhythm: Normal rate and regular rhythm.     Pulses: Normal pulses and intact distal pulses.     Heart sounds: No murmur heard. Gallop present. S3 sounds present.      Comments: Varicose veins bilateral lower extremities, left worse Pulmonary:     Effort: Pulmonary effort is normal.     Breath sounds: Normal breath sounds.  Abdominal:  General: Bowel sounds are normal.     Palpations: Abdomen is soft.  Musculoskeletal:        General: No swelling.      Laboratory examination:   Recent Labs    11/20/19 1528 02/10/20 1525 02/16/20 0939 02/20/20 0256 02/21/20 0645 02/22/20 0500  NA 139 139   < > 133* 137 134*  K 4.8 4.6   < > 3.3* 3.3* 3.7  CL 102 103   < > 96* 94* 91*  CO2 25 25   < > 26 35* 34*  GLUCOSE 115* 142*   < > 94 96 80  BUN 15 20   < > 11 8 8   CREATININE 1.03 1.08   < > 0.94 0.92 0.80  CALCIUM 9.2 9.1   < > 7.6* 7.9* 8.4*  GFRNONAA 89 83   < > >60 >60 >60  GFRAA 102 96  --   --   --   --    < > = values in this interval not displayed.   CrCl cannot be calculated (Patient's most recent lab result is  older than the maximum 21 days allowed.).  CMP Latest Ref Rng & Units 02/22/2020 02/21/2020 02/20/2020  Glucose 70 - 99 mg/dL 80 96 94  BUN 6 - 20 mg/dL 8 8 11   Creatinine 0.61 - 1.24 mg/dL 1.61 0.96 0.45  Sodium 135 - 145 mmol/L 134(L) 137 133(L)  Potassium 3.5 - 5.1 mmol/L 3.7 3.3(L) 3.3(L)  Chloride 98 - 111 mmol/L 91(L) 94(L) 96(L)  CO2 22 - 32 mmol/L 34(H) 35(H) 26  Calcium 8.9 - 10.3 mg/dL 4.0(J) 7.9(L) 7.6(L)  Total Protein 6.5 - 8.1 g/dL 8.1(X) 5.6(L) 5.5(L)  Total Bilirubin 0.3 - 1.2 mg/dL 9.1(Y) 2.3(H) 2.3(H)  Alkaline Phos 38 - 126 U/L 50 50 55  AST 15 - 41 U/L 330(H) 538(H) 1,172(H)  ALT 0 - 44 U/L 833(H) 1,056(H) 1,484(H)   CBC Latest Ref Rng & Units 02/22/2020 02/21/2020 02/20/2020  WBC 4.0 - 10.5 K/uL 5.9 6.8 9.4  Hemoglobin 13.0 - 17.0 g/dL 78.2 95.6 21.3  Hematocrit 39.0 - 52.0 % 46.8 47.2 44.7  Platelets 150 - 400 K/uL 193 200 208    Lipid Panel Recent Labs    11/20/19 1528  CHOL 184  TRIG 234*  LDLCALC 110*  HDL 33*  CHOLHDL 5.6*   Lipid Panel     Component Value Date/Time   CHOL 184 11/20/2019 1528   TRIG 234 (H) 11/20/2019 1528   HDL 33 (L) 11/20/2019 1528   CHOLHDL 5.6 (H) 11/20/2019 1528   LDLCALC 110 (H) 11/20/2019 1528   LABVLDL 41 (H) 11/20/2019 1528     HEMOGLOBIN A1C No results found for: HGBA1C, MPG TSH No results for input(s): TSH in the last 8760 hours.  Medications and allergies   Allergies  Allergen Reactions  . No Known Allergies   . Vancomycin Hives    hi8vess post op on R arm -site of infusion, and whole body itching      Medication prior to this encounter:   Outpatient Medications Prior to Visit  Medication Sig Dispense Refill  . apixaban (ELIQUIS) 5 MG TABS tablet Take 1 tablet (5 mg total) by mouth 2 (two) times daily. Only start this script after taking eliquis 10mg  BID x 6 days 60 tablet 2  . colchicine 0.6 MG tablet Take 0.6 mg by mouth daily.    . diclofenac (VOLTAREN) 75 MG EC tablet Take 75 mg by mouth daily.     Marland Kitchen  febuxostat (ULORIC) 40 MG tablet Take 40 mg by mouth daily.    Marland Kitchen HYDROcodone-acetaminophen (NORCO) 7.5-325 MG tablet Take 1 tablet by mouth every 6 (six) hours as needed. NEED FOR RA & GOUT. FLAW UP  UNTIL HE GET IN TO SEE HER IN 2 MTHS.    Marland Kitchen JARDIANCE 10 MG TABS tablet Take 10 mg by mouth daily.    . metoprolol succinate (TOPROL-XL) 100 MG 24 hr tablet Take 100 mg by mouth daily.    . potassium chloride (KLOR-CON) 10 MEQ tablet Take 1 tablet (10 mEq total) by mouth daily. 30 tablet 0  . rosuvastatin (CRESTOR) 20 MG tablet Take 20 mg by mouth daily.    . sertraline (ZOLOFT) 50 MG tablet Take 1 tablet by mouth daily.    . VERQUVO 10 MG TABS Take 1 tablet by mouth daily.    Marland Kitchen ENTRESTO 24-26 MG Take 1 tablet by mouth 2 (two) times daily.    Marland Kitchen albuterol (VENTOLIN HFA) 108 (90 Base) MCG/ACT inhaler Inhale 1-2 puffs into the lungs every 6 (six) hours as needed for wheezing or shortness of breath. 18 g 1  . benzonatate (TESSALON) 200 MG capsule Take 200 mg by mouth 3 (three) times daily as needed.    Marland Kitchen dexamethasone (DECADRON) 2 MG tablet Take by mouth.    . ENTRESTO 49-51 MG Take 1 tablet by mouth daily.    . folic acid (FOLVITE) 1 MG tablet Take 1 mg by mouth 2 (two) times daily.    . furosemide (LASIX) 40 MG tablet Take 1 tablet (40 mg total) by mouth daily. 30 tablet 0  . HYDROcodone-acetaminophen (NORCO) 10-325 MG tablet Take 1 tablet by mouth every 6 (six) hours as needed. 20 tablet 0  . hydroxyurea (HYDREA) 500 MG capsule Take 1,000 mg by mouth daily.    . metoprolol succinate (TOPROL-XL) 25 MG 24 hr tablet Take 1 tablet (25 mg total) by mouth daily. 30 tablet 0  . metoprolol tartrate (LOPRESSOR) 50 MG tablet Take 100 mg by mouth daily. (Patient not taking: Reported on 07/01/2020)    . omeprazole (PRILOSEC) 40 MG capsule Take 40 mg by mouth daily.     No facility-administered medications prior to visit.     FINAL MEDICATION AS OF TODAY:   Current Meds  Medication Sig  . apixaban  (ELIQUIS) 5 MG TABS tablet Take 1 tablet (5 mg total) by mouth 2 (two) times daily. Only start this script after taking eliquis 10mg  BID x 6 days  . colchicine 0.6 MG tablet Take 0.6 mg by mouth daily.  . diclofenac (VOLTAREN) 75 MG EC tablet Take 75 mg by mouth daily.  . febuxostat (ULORIC) 40 MG tablet Take 40 mg by mouth daily.  Marland Kitchen HYDROcodone-acetaminophen (NORCO) 7.5-325 MG tablet Take 1 tablet by mouth every 6 (six) hours as needed. NEED FOR RA & GOUT. FLAW UP  UNTIL HE GET IN TO SEE HER IN 2 MTHS.  Marland Kitchen JARDIANCE 10 MG TABS tablet Take 10 mg by mouth daily.  . metoprolol succinate (TOPROL-XL) 100 MG 24 hr tablet Take 100 mg by mouth daily.  . potassium chloride (KLOR-CON) 10 MEQ tablet Take 1 tablet (10 mEq total) by mouth daily.  . rosuvastatin (CRESTOR) 20 MG tablet Take 20 mg by mouth daily.  . sacubitril-valsartan (ENTRESTO) 49-51 MG Take 1 tablet by mouth 2 (two) times daily.  . sertraline (ZOLOFT) 50 MG tablet Take 1 tablet by mouth daily.  . VERQUVO 10 MG TABS Take 1  tablet by mouth daily.  . [DISCONTINUED] ENTRESTO 24-26 MG Take 1 tablet by mouth 2 (two) times daily.    Radiology:   CT angiogram 02/16/2020: Bilateral pulmonary emboli worst in the right lower lobe. No findings to suggest right heart strain are noted.  Bilateral patchy infiltrates likely related to the underlying emboli and may represent early sequelae of pulmonary infarct.  Critical Value/emergent results were called by telephone at the time of interpretation on 02/16/2020 at 10:49 am to Dr. Willy Eddy , who verbally acknowledged these results.  Cardiac Studies:   Echocardiogram 02/17/2020:   1. Left ventricular ejection fraction, by estimation, is <20%. The left ventricle has severely decreased function. The left ventricle demonstrates global hypokinesis. The left ventricular internal cavity size was severely dilated. There is mild left  ventricular hypertrophy. Left ventricular diastolic parameters  are consistent with Grade I diastolic dysfunction (impaired relaxation).  2. Right ventricular systolic function is low normal. The right ventricular size is mildly enlarged.  3. The mitral valve is normal in structure. Moderate to severe mitral valve regurgitation.  4. Tricuspid valve regurgitation is moderate.  5. The aortic valve is normal in structure. Aortic valve regurgitation is not visualized.   EKG:     EKG 07/16/2020: Normal sinus rhythm with rate of 69 bpm, left atrial enlargement, left axis deviation, left anterior fascicular block.  Incomplete right bundle branch block.  LVH.      Assessment     ICD-10-CM   1. Chronic systolic heart failure (HCC)  Z61.09 sacubitril-valsartan (ENTRESTO) 49-51 MG    CMP14+EGFR    TSH    PCV ECHOCARDIOGRAM COMPLETE    Pro b natriuretic peptide (BNP)    VITAMIN D 25 Hydroxy (Vit-D Deficiency, Fractures)  2. Dilated cardiomyopathy (HCC)  I42.0 PCV ECHOCARDIOGRAM COMPLETE  3. History of pulmonary embolism  Z86.711   4. Family history of premature CAD: Father with CAD at age late 70  Z40.49   5. Primary hypertension  I10 EKG 12-Lead    VITAMIN D 25 Hydroxy (Vit-D Deficiency, Fractures)  6. Tobacco use  Z72.0   7. Mixed hyperlipidemia  E78.2 Lipid Panel With LDL/HDL Ratio    LDL cholesterol, direct     Medications Discontinued During This Encounter  Medication Reason  . albuterol (VENTOLIN HFA) 108 (90 Base) MCG/ACT inhaler Error  . benzonatate (TESSALON) 200 MG capsule Error  . dexamethasone (DECADRON) 2 MG tablet Error  . ENTRESTO 49-51 MG Error  . folic acid (FOLVITE) 1 MG tablet Error  . furosemide (LASIX) 40 MG tablet Error  . HYDROcodone-acetaminophen (NORCO) 10-325 MG tablet Error  . hydroxyurea (HYDREA) 500 MG capsule Error  . metoprolol succinate (TOPROL-XL) 25 MG 24 hr tablet Error  . metoprolol tartrate (LOPRESSOR) 50 MG tablet Error  . omeprazole (PRILOSEC) 40 MG capsule Error  . ENTRESTO 24-26 MG Dose change    Meds  ordered this encounter  Medications  . sacubitril-valsartan (ENTRESTO) 49-51 MG    Sig: Take 1 tablet by mouth 2 (two) times daily.    Dispense:  60 tablet    Refill:  1   Orders Placed This Encounter  Procedures  . CMP14+EGFR  . TSH  . Lipid Panel With LDL/HDL Ratio  . LDL cholesterol, direct  . Pro b natriuretic peptide (BNP)  . VITAMIN D 25 Hydroxy (Vit-D Deficiency, Fractures)  . EKG 12-Lead  . PCV ECHOCARDIOGRAM COMPLETE    Standing Status:   Future    Standing Expiration Date:   07/16/2021  Recommendations:   Ryan Gibson is a 45 y.o. Caucasian male patient with hypertension, tobacco use disorder, alcohol abuse, strong family history of premature coronary disease in his father who had coronary artery disease in his late 81s, who is unvaccinated for COVID-19 and appears to have symptoms which are very mild sometime in October 2021 as his whole family had COVID-19 infection.  He presented to [facility] on 02/16/2020 with right leg swelling and edema and pain, marked dyspnea on exertion and also worsened to a point of shortness of breath at rest, orthopnea, and was diagnosed with acute bilateral pulmonary emboli and DVT of the right lower extremity and severe dilated cardiomyopathy with EF <20%.  He was also found to have acute cholecystitis due to gallstones.  Extremely complex presentation, his DVT and PE could have been related to possibly COVID-19 infection, he was never tested as he never presented for evaluation in October when his entire family was infected with COVID.  A month to 6 weeks later he presented with DVT and PE.  He also has dilated cardiomyopathy.  His hypercoagulable state could be explained by potential COVID-19 infection although he was tested negative on presentation as it was a delayed presentation, his cardiomyopathy could certainly be related to alcohol abuse.  Patient drinks anywhere from 10-12 bottles of beer on a daily basis and 20-24  bottles of beer on the weekends.  I had a very long discussion with the patient regarding making lifestyle changes.  He has a strong family history of premature coronary disease in his father, mixed hyperlipidemia, ongoing tobacco use disorder as cardiovascular risk as well.  I reviewed his chart extensively, updated his charts.  He is presently on medical leave from his job, I have advised him that as long as he does not have to lift anything heavy or has to climb ladders he could return to work.  He works in the [employer] involving frequent getting up and down and/or climbing ladders or crawling underneath crawl spaces, this could potentially be harder.  Before making the final decision on disabilities, I would like to obtain repeat echocardiogram.  I will increase his Entresto from low-dose to medium dose.  Continue with his metoprolol succinate.  Patient's dizziness is related to orthostatic hypotension and hence we have to be careful with uptitrating his medications from cardiac standpoint.  I suspect autonomic insufficiency may be related to alcohol abuse.  He has a very strong family history of premature coronary disease and will need ischemic work-up once stable.  He will also need lipid-lowering therapy.  I would like to repeat his labs prior to starting any therapy.  We will also check vitamin D levels.  Is presently using smokeless tobacco, I have extensively discussed with the patient regarding high risk for recurrent pulmonary embolism and thrombotic events and potential for atherosclerotic cardiovascular disease.  Strict warnings were given to the patient.  I spent additional 10 minutes regarding smoking cessation with patient and his wife at the bedside.  Lifestyle modification including dietary changes were also discussed briefly.  This was a 75-minute office visit encounter with review of all his external medical records, discussions regarding his complex medical  issues.   Yates Decamp, MD, [facility] 07/16/2020, 11:03 PM Office: [phone]

## 2020-07-16 NOTE — Telephone Encounter (Signed)
Called to notify & update this patient is no longer a patient of Dr Karilyn Cota practice. Verifyname, dob, claim# to rep to update in there notes.  Ryan Gibson DOB:06-18-1975 Claim#[id number].  This form will be shredded after this call was made.

## 2020-07-23 ENCOUNTER — Ambulatory Visit: Payer: BC Managed Care – PPO

## 2020-07-23 ENCOUNTER — Other Ambulatory Visit: Payer: Self-pay

## 2020-07-23 DIAGNOSIS — I5022 Chronic systolic (congestive) heart failure: Secondary | ICD-10-CM

## 2020-07-23 DIAGNOSIS — E559 Vitamin D deficiency, unspecified: Secondary | ICD-10-CM | POA: Diagnosis not present

## 2020-07-23 DIAGNOSIS — I420 Dilated cardiomyopathy: Secondary | ICD-10-CM

## 2020-07-24 LAB — LIPID PANEL WITH LDL/HDL RATIO
Cholesterol, Total: 160 mg/dL (ref 100–199)
HDL: 50 mg/dL (ref >39)
LDL Chol Calc (NIH): 78 mg/dL (ref 0–99)
LDL/HDL Ratio: 1.6 ratio (ref 0.0–3.6)
Triglycerides: 190 mg/dL — ABNORMAL HIGH (ref 0–149)
VLDL Cholesterol Cal: 32 mg/dL (ref 5–40)

## 2020-07-24 LAB — CMP14+EGFR
ALT: 26 IU/L (ref 0–44)
AST: 31 IU/L (ref 0–40)
Albumin/Globulin Ratio: 2 (ref 1.2–2.2)
Albumin: 4.7 g/dL (ref 4.0–5.0)
Alkaline Phosphatase: 46 IU/L (ref 44–121)
BUN/Creatinine Ratio: 13 (ref 9–20)
BUN: 12 mg/dL (ref 6–24)
Bilirubin Total: 0.9 mg/dL (ref 0.0–1.2)
CO2: 26 mmol/L (ref 20–29)
Calcium: 9.3 mg/dL (ref 8.7–10.2)
Chloride: 102 mmol/L (ref 96–106)
Creatinine, Ser: 0.96 mg/dL (ref 0.76–1.27)
Globulin, Total: 2.3 g/dL (ref 1.5–4.5)
Glucose: 86 mg/dL (ref 65–99)
Potassium: 4.5 mmol/L (ref 3.5–5.2)
Sodium: 143 mmol/L (ref 134–144)
Total Protein: 7 g/dL (ref 6.0–8.5)
eGFR: 100 mL/min/{1.73_m2} (ref >59)

## 2020-07-24 LAB — VITAMIN D 25 HYDROXY (VIT D DEFICIENCY, FRACTURES): Vit D, 25-Hydroxy: 29.1 ng/mL — ABNORMAL LOW (ref 30.0–100.0)

## 2020-07-24 LAB — LDL CHOLESTEROL, DIRECT: LDL Direct: 82 mg/dL (ref 0–99)

## 2020-07-24 LAB — PRO B NATRIURETIC PEPTIDE: NT-Pro BNP: 1082 pg/mL — ABNORMAL HIGH (ref 0–86)

## 2020-07-24 LAB — TSH: TSH: 2.52 u[IU]/mL (ref 0.450–4.500)

## 2020-08-03 ENCOUNTER — Telehealth: Payer: Self-pay

## 2020-08-03 NOTE — Telephone Encounter (Signed)
Patient called about 2 questions you had for him:   How do you feel now?  Still feel tired and rundown. After sleeping 8 or more hours, still feels sleepy like he had not had any sleep at all. 0 energy.  Do you feel like you can return to work?  No, not on full duty. Job requires him to be there 100% or not at all.   FMLA paperwork is time sensitive and needs to be completed and faxed by the end of the day, for his job to approve time off, per patient.

## 2020-08-21 ENCOUNTER — Ambulatory Visit: Payer: BC Managed Care – PPO | Admitting: Cardiology

## 2020-08-21 ENCOUNTER — Other Ambulatory Visit: Payer: Self-pay

## 2020-08-21 ENCOUNTER — Encounter: Payer: Self-pay | Admitting: Cardiology

## 2020-08-21 VITALS — BP 109/75 | HR 88 | Temp 98.3°F | Resp 16 | Ht 73.5 in | Wt 243.4 lb

## 2020-08-21 DIAGNOSIS — I5022 Chronic systolic (congestive) heart failure: Secondary | ICD-10-CM

## 2020-08-21 DIAGNOSIS — F17298 Nicotine dependence, other tobacco product, with other nicotine-induced disorders: Secondary | ICD-10-CM

## 2020-08-21 DIAGNOSIS — E559 Vitamin D deficiency, unspecified: Secondary | ICD-10-CM

## 2020-08-21 DIAGNOSIS — I420 Dilated cardiomyopathy: Secondary | ICD-10-CM | POA: Diagnosis not present

## 2020-08-21 DIAGNOSIS — I10 Essential (primary) hypertension: Secondary | ICD-10-CM

## 2020-08-21 MED ORDER — VITAMIN D 50 MCG (2000 UT) PO TABS: 2000.0000 [IU] | ORAL_TABLET | Freq: Every day | ORAL | 1 refills | Status: DC

## 2020-08-21 MED ORDER — METOPROLOL SUCCINATE ER 100 MG PO TB24: 150.0000 mg | ORAL_TABLET | Freq: Every day | ORAL | 1 refills | Status: DC

## 2020-08-21 NOTE — Progress Notes (Signed)
Primary Physician/Referring:  Pcp, No  Patient ID: Ryan Gibson, male    DOB: Jul 16, 1975, 45 y.o.   MRN: [id number]  Chief Complaint  Patient presents with   Cardiomyopathy   Dizziness   Follow-up    3 weeks   HPI:    [name]  is a 45 y.o. Caucasian male patient with hypertension, tobacco use disorder, alcohol abuse, strong family history of premature coronary disease in his father who had coronary artery disease in his late 20s, who is unvaccinated for COVID-19 and appears to have very mild symptoms of Covid 19 infection sometime in October 2021 as his whole family had COVID-19 infection.    He presented to [facility] on 02/16/2020 with right leg swelling and edema and pain, marked dyspnea on exertion and also worsened to a point of shortness of breath at rest, orthopnea, and was diagnosed with acute bilateral pulmonary emboli and DVT of the right lower extremity and severe dilated cardiomyopathy with EF <20%.  He was also found to have acute cholecystitis due to gallstones.  Extremely complex presentation, his DVT and PE could have been related to possibly COVID-19 infection, although he was tested negative on presentation as it was a delayed presentation  He also has dilated cardiomyopathy, his cardiomyopathy could certainly be related to alcohol abuse.  Patient drinks anywhere from 10-12 bottles of beer on a daily basis and 20-24 bottles of beer on the weekends. He now states he has reduced to about 8-10 beers a day and still chewing tobacco.  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  He still has mild fatigue and mild dyspnea on exertion.  Past Medical History:  Diagnosis Date   Asthma    AS A CHILD ONLY   Foot pain, right    painful x 1 month and swelling   Past Surgical History:  Procedure Laterality Date   HERNIA REPAIR  07/13/2011   [facility]   INGUINAL  HERNIA REPAIR  07/13/2011   Procedure: HERNIA REPAIR INGUINAL ADULT;  Surgeon: Wilmon Arms. Corliss Skains, MD;  Location: [facility];  Service: General;  Laterality: Right;   NO PREVIOUS SURGERY     Family History  Problem Relation Age of Onset   Healthy Mother    Heart disease Father     Social History   Tobacco Use   Smoking status: Never   Smokeless tobacco: Current    Types: Chew  Substance Use Topics   Alcohol use: Yes    Comment: Social   Marital Status: Married  ROS  Review of Systems  Cardiovascular:  Negative for chest pain, dyspnea on exertion and leg swelling.  Gastrointestinal:  Negative for melena.  Neurological:  Negative for dizziness.  Objective  Blood pressure 109/75, pulse 88, temperature 98.3 F (36.8 C), temperature source Temporal, resp. rate 16, height 6' 1.5" (1.867 m), weight 243 lb 6.4 oz (110.4 kg), SpO2 97 %. Body mass index is 31.68 kg/m.   Vitals with BMI 08/21/2020 07/16/2020 07/01/2020  Height 6' 1.5" 6' 1.5" 6' 1.5"  Weight 243 lbs 6 oz 236 lbs 13 oz 240 lbs  BMI 31.67 30.82 31.23  Systolic 109 140 657  Diastolic 75 89 92  Pulse 88 81 92    Orthostatic VS for the past 72 hrs (Last 3 readings):  Orthostatic BP Patient Position BP Location Cuff Size Orthostatic Pulse  08/21/20 1315 100/63  Standing Left Arm Large 83  08/21/20 1314 113/87 Sitting Left Arm Large 85  08/21/20 1312 116/73 Supine Left Arm Large 79     Physical Exam Constitutional:      Appearance: He is obese.  Neck:     Vascular: No carotid bruit or JVD.  Cardiovascular:     Rate and Rhythm: Normal rate and regular rhythm.     Pulses: Normal pulses and intact distal pulses.     Heart sounds: No murmur heard.   No gallop.     Comments: Varicose veins bilateral lower extremities, left worse Pulmonary:     Effort: Pulmonary effort is normal.     Breath sounds: Normal breath sounds.  Abdominal:     General: Bowel sounds are normal.     Palpations: Abdomen is soft.  Musculoskeletal:         General: No swelling.    Laboratory examination:   Recent Labs    11/20/19 1528 02/10/20 1525 02/16/20 0939 02/20/20 0256 02/21/20 0645 02/22/20 0500 07/23/20 1015  NA 139 139   < > 133* 137 134* 143  K 4.8 4.6   < > 3.3* 3.3* 3.7 4.5  CL 102 103   < > 96* 94* 91* 102  CO2 25 25   < > 26 35* 34* 26  GLUCOSE 115* 142*   < > 94 96 80 86  BUN 15 20   < > 11 8 8 12   CREATININE 1.03 1.08   < > 0.94 0.92 0.80 0.96  CALCIUM 9.2 9.1   < > 7.6* 7.9* 8.4* 9.3  GFRNONAA 89 83   < > >60 >60 >60  --   GFRAA 102 96  --   --   --   --   --    < > = values in this interval not displayed.   CrCl cannot be calculated (Patient's most recent lab result is older than the maximum 21 days allowed.).  CMP Latest Ref Rng & Units 07/23/2020 02/22/2020 02/21/2020  Glucose 65 - 99 mg/dL 86 80 96  BUN 6 - 24 mg/dL 12 8 8   Creatinine 0.76 - 1.27 mg/dL 4.09 8.11 9.14  Sodium 134 - 144 mmol/L 143 134(L) 137  Potassium 3.5 - 5.2 mmol/L 4.5 3.7 3.3(L)  Chloride 96 - 106 mmol/L 102 91(L) 94(L)  CO2 20 - 29 mmol/L 26 34(H) 35(H)  Calcium 8.7 - 10.2 mg/dL 9.3 7.8(G) 7.9(L)  Total Protein 6.0 - 8.5 g/dL 7.0 9.5(A) 5.6(L)  Total Bilirubin 0.0 - 1.2 mg/dL 0.9 2.1(H) 2.3(H)  Alkaline Phos 44 - 121 IU/L 46 50 50  AST 0 - 40 IU/L 31 330(H) 538(H)  ALT 0 - 44 IU/L 26 833(H) 1,056(H)   CBC Latest Ref Rng & Units 02/22/2020 02/21/2020 02/20/2020  WBC 4.0 - 10.5 K/uL 5.9 6.8 9.4  Hemoglobin 13.0 - 17.0 g/dL 08.6 57.8 46.9  Hematocrit 39.0 - 52.0 % 46.8 47.2 44.7  Platelets 150 - 400 K/uL 193 200 208   Lipid Panel Recent Labs    11/20/19 1528 07/23/20 1015  CHOL 184 160  TRIG 234* 190*  LDLCALC 110* 78  HDL 33* 50  CHOLHDL 5.6*  --   LDLDIRECT  --  82   Lipid Panel     Component Value Date/Time   CHOL 160 07/23/2020 1015   TRIG 190 (H) 07/23/2020 1015   HDL 50 07/23/2020 1015   CHOLHDL 5.6 (H) 11/20/2019 1528   LDLCALC 78 07/23/2020 1015  LDLDIRECT 82 07/23/2020 1015   LABVLDL 32 07/23/2020 1015      HEMOGLOBIN A1C No results found for: HGBA1C, MPG TSH Recent Labs    07/23/20 1015  TSH 2.520    Medications and allergies   Allergies  Allergen Reactions   No Known Allergies    Vancomycin Hives    hi8vess post op on R arm -site of infusion, and whole body itching      Medication prior to this encounter:   Outpatient Medications Prior to Visit  Medication Sig Dispense Refill   apixaban (ELIQUIS) 5 MG TABS tablet Take 1 tablet (5 mg total) by mouth 2 (two) times daily. Only start this script after taking eliquis 10mg  BID x 6 days 60 tablet 2   colchicine 0.6 MG tablet Take 0.6 mg by mouth daily.     diclofenac (VOLTAREN) 75 MG EC tablet Take 75 mg by mouth daily.     febuxostat (ULORIC) 40 MG tablet Take 40 mg by mouth daily.     HYDROcodone-acetaminophen (NORCO) 7.5-325 MG tablet Take 1 tablet by mouth every 6 (six) hours as needed. NEED FOR RA & GOUT. FLAW UP  UNTIL HE GET IN TO SEE HER IN 2 MTHS.     JARDIANCE 10 MG TABS tablet Take 10 mg by mouth daily.     rosuvastatin (CRESTOR) 20 MG tablet Take 20 mg by mouth daily.     sacubitril-valsartan (ENTRESTO) 49-51 MG Take 1 tablet by mouth 2 (two) times daily. 60 tablet 1   sertraline (ZOLOFT) 50 MG tablet Take 1 tablet by mouth daily.     VERQUVO 10 MG TABS Take 1 tablet by mouth daily.     metoprolol succinate (TOPROL-XL) 100 MG 24 hr tablet Take 100 mg by mouth daily.     potassium chloride (KLOR-CON) 10 MEQ tablet Take 1 tablet (10 mEq total) by mouth daily. 30 tablet 0   No facility-administered medications prior to visit.    FINAL MEDICATION AS OF TODAY:   Current Meds  Medication Sig   apixaban (ELIQUIS) 5 MG TABS tablet Take 1 tablet (5 mg total) by mouth 2 (two) times daily. Only start this script after taking eliquis 10mg  BID x 6 days   Cholecalciferol (VITAMIN D) 50 MCG (2000 UT) tablet Take 1 tablet (2,000 Units total) by mouth daily.   colchicine 0.6 MG tablet Take 0.6 mg by mouth daily.   diclofenac  (VOLTAREN) 75 MG EC tablet Take 75 mg by mouth daily.   febuxostat (ULORIC) 40 MG tablet Take 40 mg by mouth daily.   HYDROcodone-acetaminophen (NORCO) 7.5-325 MG tablet Take 1 tablet by mouth every 6 (six) hours as needed. NEED FOR RA & GOUT. FLAW UP  UNTIL HE GET IN TO SEE HER IN 2 MTHS.   JARDIANCE 10 MG TABS tablet Take 10 mg by mouth daily.   rosuvastatin (CRESTOR) 20 MG tablet Take 20 mg by mouth daily.   sacubitril-valsartan (ENTRESTO) 49-51 MG Take 1 tablet by mouth 2 (two) times daily.   sertraline (ZOLOFT) 50 MG tablet Take 1 tablet by mouth daily.   VERQUVO 10 MG TABS Take 1 tablet by mouth daily.   [DISCONTINUED] metoprolol succinate (TOPROL-XL) 100 MG 24 hr tablet Take 100 mg by mouth daily.    Radiology:   CT angiogram 02/16/2020: Bilateral pulmonary emboli worst in the right lower lobe. No findings to suggest right heart strain are noted.   Bilateral patchy infiltrates likely related to the underlying emboli and may  represent early sequelae of pulmonary infarct.   Critical Value/emergent results were called by telephone at the time of interpretation on 02/16/2020 at 10:49 am to Dr. Willy Eddy , who verbally acknowledged these results.  Cardiac Studies:   Echocardiogram 07/23/2020: Severely depressed LV systolic function with visual EF 25-30%. Left ventricle cavity is dilated. Hypokinetic global wall motion. Doppler evidence of grade I (impaired) diastolic dysfunction, normal LAP. Mild (Grade I) mitral regurgitation. May consider cardiac MRI or MUGA scan to better evaluate the LVEF. Compared to study dated 02/17/2020: LVEF was <20% now 25-30%, moderate/severe MR is now mild MR, moderate TR is now trace, otherwise no significant change.   EKG:    EKG 07/16/2020: Normal sinus rhythm with rate of 69 bpm, left atrial enlargement, left axis deviation, left anterior fascicular block.  Incomplete right bundle branch block.  LVH.      Assessment     ICD-10-CM   1.  Chronic systolic heart failure (HCC)  P32.95 metoprolol succinate (TOPROL-XL) 100 MG 24 hr tablet    PCV MYOCARDIAL PERFUSION WITH LEXISCAN    PCV ECHOCARDIOGRAM COMPLETE    2. Dilated cardiomyopathy (HCC)  I42.0 PCV MYOCARDIAL PERFUSION WITH LEXISCAN    3. Primary hypertension  I10     4. Nicotine dependence, other tobacco product, w other disorders: Chews tobacco  F17.298     5. Hypovitaminosis D  E55.9 Cholecalciferol (VITAMIN D) 50 MCG (2000 UT) tablet      Medications Discontinued During This Encounter  Medication Reason   potassium chloride (KLOR-CON) 10 MEQ tablet Error   metoprolol succinate (TOPROL-XL) 100 MG 24 hr tablet Reorder    Meds ordered this encounter  Medications   Cholecalciferol (VITAMIN D) 50 MCG (2000 UT) tablet    Sig: Take 1 tablet (2,000 Units total) by mouth daily.    Dispense:  90 tablet    Refill:  1   metoprolol succinate (TOPROL-XL) 100 MG 24 hr tablet    Sig: Take 1.5 tablets (150 mg total) by mouth daily.    Dispense:  135 tablet    Refill:  1   Orders Placed This Encounter  Procedures   PCV MYOCARDIAL PERFUSION WITH LEXISCAN    Standing Status:   Future    Standing Expiration Date:   10/22/2020   PCV ECHOCARDIOGRAM COMPLETE    Standing Status:   Future    Standing Expiration Date:   08/21/2021    Order Specific Question:   Comment    Answer:   Prior to OV in 3 months   Recommendations:   Hridhaan Keir is a 45 y.o. Caucasian male patient with hypertension, tobacco use disorder, alcohol abuse, strong family history of premature coronary disease in his father who had coronary artery disease in his late 43s, who is unvaccinated for COVID-19 and appears to have very mild symptoms of Covid 19 infection sometime in October 2021 as his whole family had COVID-19 infection.    He presented to [facility] on 02/16/2020 with right leg swelling and edema and pain, marked dyspnea on exertion and also worsened to a point of shortness of  breath at rest, orthopnea, and was diagnosed with acute bilateral pulmonary emboli and DVT of the right lower extremity and severe dilated cardiomyopathy with EF <20%.  He was also found to have acute cholecystitis due to gallstones.  Extremely complex presentation, his DVT and PE could have been related to possibly COVID-19 infection, although he was tested negative on presentation as it was  a delayed presentation  He also has dilated cardiomyopathy, his cardiomyopathy could certainly be related to alcohol abuse.  Patient drinks anywhere from 10-12 bottles of beer on a daily basis and 20-24 bottles of beer on the weekends. He now states he has reduced to about 8-10 beers a day and still chewing tobacco.  His symptoms of dizziness, fatigue and shortness of breath has improved as well.  On physical exam there is no further S3 gallop, no murmur, and by echocardiogram moderately severe MR is now only mild MR.  Overall I see that his LVEF has improved slightly as well and today there is no active congestive heart failure.  I have again discussed with him regarding congestive heart failure, clinical presentation of heart failure, avoidance of excess salt and also certainly complete abstinence from alcohol and also tobacco.  His blood pressure is soft, for now continue moderate dose Entresto but because his heart rate is greater than 70, will increase his Toprol succinate from 100 mg daily to 150 mg daily which he can either split or take together.  I will also repeat echocardiogram prior to his next office visit in 3 months.  Vitamin D is low, supplement Rx sent. In view of cardiomyopathy and family strip which coronary disease, will set him up for Lexiscan nuclear stress test (patient on high-dose beta-blocker, do not want to hold for more than 1 dose).  I will also repeat his echocardiogram in 3 months and see him back at that time.  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  I do not have a contraindication for this.  This was a 40-minute office visit encounter.    Yates Decamp, MD, [facility] 08/21/2020, 3:56 PM Office: [phone]

## 2020-09-13 ENCOUNTER — Other Ambulatory Visit: Payer: Self-pay | Admitting: Cardiology

## 2020-09-13 DIAGNOSIS — I5022 Chronic systolic (congestive) heart failure: Secondary | ICD-10-CM

## 2020-09-14 ENCOUNTER — Other Ambulatory Visit: Payer: Self-pay | Admitting: Cardiology

## 2020-09-14 DIAGNOSIS — F321 Major depressive disorder, single episode, moderate: Secondary | ICD-10-CM

## 2020-09-14 MED ORDER — SERTRALINE HCL 50 MG PO TABS: 50.0000 mg | ORAL_TABLET | Freq: Every day | ORAL | 1 refills | Status: DC

## 2020-09-14 NOTE — Telephone Encounter (Signed)
Delice Bison!! What medications are the two he is asking? Thank you

## 2020-09-14 NOTE — Telephone Encounter (Signed)
Sertraline 50 mg

## 2020-09-14 NOTE — Telephone Encounter (Signed)
From pt

## 2020-09-22 ENCOUNTER — Encounter: Payer: Self-pay | Admitting: Cardiology

## 2020-09-22 ENCOUNTER — Telehealth: Payer: BC Managed Care – PPO | Admitting: Physician Assistant

## 2020-09-22 ENCOUNTER — Encounter: Payer: Self-pay | Admitting: Physician Assistant

## 2020-09-22 DIAGNOSIS — Z760 Encounter for issue of repeat prescription: Secondary | ICD-10-CM

## 2020-09-22 NOTE — Progress Notes (Signed)
Patient's PCP passed away unexpectedly yesterday. He was unsure what to do about refills since no other MD in office. Out of testosterone which I discussed we are not allowed to prescribe through this avenue. Encouraged him to reach out to his current specialists while finding a new PCP.   No charge.

## 2020-09-22 NOTE — Telephone Encounter (Signed)
From pt

## 2020-09-22 NOTE — Telephone Encounter (Signed)
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   Yates Decamp, MD, [facility] 09/22/2020, 2:02 PM Office: [phone] Fax: [phone] Pager: [phone]

## 2020-11-10 ENCOUNTER — Other Ambulatory Visit: Payer: Self-pay | Admitting: Cardiology

## 2020-11-10 DIAGNOSIS — I5022 Chronic systolic (congestive) heart failure: Secondary | ICD-10-CM

## 2020-11-10 DIAGNOSIS — I420 Dilated cardiomyopathy: Secondary | ICD-10-CM

## 2020-11-16 ENCOUNTER — Other Ambulatory Visit: Payer: BC Managed Care – PPO

## 2020-11-23 ENCOUNTER — Ambulatory Visit: Payer: BC Managed Care – PPO | Admitting: Cardiology

## 2020-11-30 ENCOUNTER — Other Ambulatory Visit: Payer: BC Managed Care – PPO

## 2020-12-09 ENCOUNTER — Ambulatory Visit: Payer: BC Managed Care – PPO

## 2020-12-09 ENCOUNTER — Other Ambulatory Visit: Payer: Self-pay

## 2020-12-09 DIAGNOSIS — I5022 Chronic systolic (congestive) heart failure: Secondary | ICD-10-CM

## 2020-12-09 DIAGNOSIS — I420 Dilated cardiomyopathy: Secondary | ICD-10-CM

## 2020-12-10 LAB — PCV MYOCARDIAL PERFUSION WITH LEXISCAN: ST Depression (mm): 0 mm

## 2020-12-14 NOTE — Progress Notes (Signed)
Primary Physician/Referring:  Pcp, No  Patient ID: Ryan Gibson, male    DOB: [birth year], 45 y.o.   MRN: [id number]  Chief Complaint  Patient presents with   Cardiomyopathy   Dizziness   Follow-up    3 weeks   HPI:    Ryan Gibson  is a 45 y.o. Caucasian male patient with hypertension, tobacco use disorder, alcohol abuse, strong family history of premature coronary disease in his father who had coronary artery disease in his late 20s, who is unvaccinated for COVID-19 and appears to have very mild symptoms of Covid 19 infection sometime in October 2021 as his whole family had COVID-19 infection.    He presented to [facility] on 02/16/2020 with right leg swelling and edema and pain, marked dyspnea on exertion and also worsened to a point of shortness of breath at rest, orthopnea, and was diagnosed with acute bilateral pulmonary emboli and DVT of the right lower extremity and severe dilated cardiomyopathy with EF <20%.  He was also found to have acute cholecystitis due to gallstones.  Extremely complex presentation, his DVT and PE could have been related to possibly COVID-19 infection, although he was tested negative on presentation as it was a delayed presentation  He also has dilated cardiomyopathy, his cardiomyopathy could certainly be related to alcohol abuse.  Patient drinks anywhere from 10-12 bottles of beer on a daily basis and 20-24 bottles of beer on the weekends. He now states he has reduced to about 8-10 beers a day and still chewing tobacco.  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  He still has mild fatigue and mild dyspnea on exertion.   Best he has felt in years. Drinking 1-2 drinks. No new symptoms.  Past Medical History:  Diagnosis Date   Asthma    AS A CHILD ONLY   Foot pain, right    painful x 1 month and swelling   Past Surgical History:   Procedure Laterality Date   HERNIA REPAIR  07/13/2011   [facility]   INGUINAL HERNIA REPAIR  07/13/2011   Procedure: HERNIA REPAIR INGUINAL ADULT;  Surgeon: Wilmon Arms. Corliss Skains, MD;  Location: [facility];  Service: General;  Laterality: Right;   NO PREVIOUS SURGERY     Family History  Problem Relation Age of Onset   Healthy Mother    Heart disease Father     Social History   Tobacco Use   Smoking status: Never   Smokeless tobacco: Current    Types: Chew  Substance Use Topics   Alcohol use: Yes    Comment: Social   Marital Status: Married  ROS  Review of Systems  Cardiovascular:  Negative for chest pain, dyspnea on exertion and leg swelling.  Gastrointestinal:  Negative for melena.  Neurological:  Negative for dizziness.  Objective  Blood pressure 109/75, pulse 88, temperature 98.3 F (36.8 C), temperature source Temporal, resp. rate 16, height 6' 1.5" (1.867 m), weight 243 lb 6.4 oz (110.4 kg), SpO2 97 %. Body mass index is 31.68 kg/m.   Vitals with BMI 08/21/2020 07/16/2020 07/01/2020  Height 6' 1.5" 6' 1.5" 6' 1.5"  Weight 243 lbs 6 oz 236 lbs 13 oz 240 lbs  BMI 31.67 30.82 31.23  Systolic 109 140 782  Diastolic 75 89 92  Pulse 88 81 92    Orthostatic VS for the past 72 hrs (Last 3 readings):  Orthostatic BP Patient Position BP Location Cuff Size Orthostatic Pulse  08/21/20 1315 100/63 Standing Left Arm Large 83  08/21/20 1314 113/87 Sitting Left Arm Large 85  08/21/20 1312 116/73 Supine Left Arm Large 79     Physical Exam Constitutional:      Appearance: He is obese.  Neck:     Vascular: No carotid bruit or JVD.  Cardiovascular:     Rate and Rhythm: Normal rate and regular rhythm.     Pulses: Normal pulses and intact distal pulses.     Heart sounds: No murmur heard.   No gallop.     Comments: Varicose veins bilateral lower extremities, left worse Pulmonary:     Effort: Pulmonary effort is normal.     Breath sounds: Normal breath sounds.  Abdominal:     General: Bowel  sounds are normal.     Palpations: Abdomen is soft.  Musculoskeletal:        General: No swelling.    Laboratory examination:   Recent Labs    11/20/19 1528 02/10/20 1525 02/16/20 0939 02/20/20 0256 02/21/20 0645 02/22/20 0500 07/23/20 1015  NA 139 139   < > 133* 137 134* 143  K 4.8 4.6   < > 3.3* 3.3* 3.7 4.5  CL 102 103   < > 96* 94* 91* 102  CO2 25 25   < > 26 35* 34* 26  GLUCOSE 115* 142*   < > 94 96 80 86  BUN 15 20   < > 11 8 8 12   CREATININE 1.03 1.08   < > 0.94 0.92 0.80 0.96  CALCIUM 9.2 9.1   < > 7.6* 7.9* 8.4* 9.3  GFRNONAA 89 83   < > >60 >60 >60  --   GFRAA 102 96  --   --   --   --   --    < > = values in this interval not displayed.   CrCl cannot be calculated (Patient's most recent lab result is older than the maximum 21 days allowed.).  CMP Latest Ref Rng & Units 07/23/2020 02/22/2020 02/21/2020  Glucose 65 - 99 mg/dL 86 80 96  BUN 6 - 24 mg/dL 12 8 8   Creatinine 0.76 - 1.27 mg/dL 1.61 0.96 0.45  Sodium 134 - 144 mmol/L 143 134(L) 137  Potassium 3.5 - 5.2 mmol/L 4.5 3.7 3.3(L)  Chloride 96 - 106 mmol/L 102 91(L) 94(L)  CO2 20 - 29 mmol/L 26 34(H) 35(H)  Calcium 8.7 - 10.2 mg/dL 9.3 4.0(J) 7.9(L)  Total Protein 6.0 - 8.5 g/dL 7.0 8.1(X) 5.6(L)  Total Bilirubin 0.0 - 1.2 mg/dL 0.9 9.1(Y) 2.3(H)  Alkaline Phos 44 - 121 IU/L 46 50 50  AST 0 - 40 IU/L 31 330(H) 538(H)  ALT 0 - 44 IU/L 26 833(H) 1,056(H)   CBC Latest Ref Rng & Units 02/22/2020 02/21/2020 02/20/2020  WBC 4.0 - 10.5 K/uL 5.9 6.8 9.4  Hemoglobin 13.0 - 17.0 g/dL 78.2 95.6 21.3  Hematocrit 39.0 - 52.0 % 46.8 47.2 44.7  Platelets 150 - 400 K/uL 193 200 208   Lipid Panel Recent Labs    11/20/19 1528 07/23/20 1015  CHOL 184 160  TRIG 234* 190*  LDLCALC 110* 78  HDL 33* 50  CHOLHDL 5.6*  --   LDLDIRECT  --  82   Lipid Panel     Component Value Date/Time   CHOL 160 07/23/2020 1015   TRIG 190 (H) 07/23/2020 1015   HDL 50 07/23/2020 1015  CHOLHDL 5.6 (H) 11/20/2019 1528   LDLCALC 78  07/23/2020 1015   LDLDIRECT 82 07/23/2020 1015   LABVLDL 32 07/23/2020 1015     HEMOGLOBIN A1C No results found for: HGBA1C, MPG TSH Recent Labs    07/23/20 1015  TSH 2.520    Medications and allergies   Allergies  Allergen Reactions   No Known Allergies    Vancomycin Hives    hi8vess post op on R arm -site of infusion, and whole body itching      Medication prior to this encounter:   Outpatient Medications Prior to Visit  Medication Sig Dispense Refill   apixaban (ELIQUIS) 5 MG TABS tablet Take 1 tablet (5 mg total) by mouth 2 (two) times daily. Only start this script after taking eliquis 10mg  BID x 6 days 60 tablet 2   colchicine 0.6 MG tablet Take 0.6 mg by mouth daily.     diclofenac (VOLTAREN) 75 MG EC tablet Take 75 mg by mouth daily.     febuxostat (ULORIC) 40 MG tablet Take 40 mg by mouth daily.     HYDROcodone-acetaminophen (NORCO) 7.5-325 MG tablet Take 1 tablet by mouth every 6 (six) hours as needed. NEED FOR RA & GOUT. FLAW UP  UNTIL HE GET IN TO SEE HER IN 2 MTHS.     JARDIANCE 10 MG TABS tablet Take 10 mg by mouth daily.     rosuvastatin (CRESTOR) 20 MG tablet Take 20 mg by mouth daily.     sacubitril-valsartan (ENTRESTO) 49-51 MG Take 1 tablet by mouth 2 (two) times daily. 60 tablet 1   sertraline (ZOLOFT) 50 MG tablet Take 1 tablet by mouth daily.     VERQUVO 10 MG TABS Take 1 tablet by mouth daily.     metoprolol succinate (TOPROL-XL) 100 MG 24 hr tablet Take 100 mg by mouth daily.     potassium chloride (KLOR-CON) 10 MEQ tablet Take 1 tablet (10 mEq total) by mouth daily. 30 tablet 0   No facility-administered medications prior to visit.    FINAL MEDICATION AS OF TODAY:   Current Meds  Medication Sig   apixaban (ELIQUIS) 5 MG TABS tablet Take 1 tablet (5 mg total) by mouth 2 (two) times daily. Only start this script after taking eliquis 10mg  BID x 6 days   Cholecalciferol (VITAMIN D) 50 MCG (2000 UT) tablet Take 1 tablet (2,000 Units total) by mouth  daily.   colchicine 0.6 MG tablet Take 0.6 mg by mouth daily.   diclofenac (VOLTAREN) 75 MG EC tablet Take 75 mg by mouth daily.   febuxostat (ULORIC) 40 MG tablet Take 40 mg by mouth daily.   HYDROcodone-acetaminophen (NORCO) 7.5-325 MG tablet Take 1 tablet by mouth every 6 (six) hours as needed. NEED FOR RA & GOUT. FLAW UP  UNTIL HE GET IN TO SEE HER IN 2 MTHS.   JARDIANCE 10 MG TABS tablet Take 10 mg by mouth daily.   rosuvastatin (CRESTOR) 20 MG tablet Take 20 mg by mouth daily.   sacubitril-valsartan (ENTRESTO) 49-51 MG Take 1 tablet by mouth 2 (two) times daily.   sertraline (ZOLOFT) 50 MG tablet Take 1 tablet by mouth daily.   VERQUVO 10 MG TABS Take 1 tablet by mouth daily.   [DISCONTINUED] metoprolol succinate (TOPROL-XL) 100 MG 24 hr tablet Take 100 mg by mouth daily.    Radiology:   CT angiogram 02/16/2020: Bilateral pulmonary emboli worst in the right lower lobe. No findings to suggest right heart strain are noted.  Bilateral patchy infiltrates likely related to the underlying emboli and may represent early sequelae of pulmonary infarct.   Critical Value/emergent results were called by telephone at the time of interpretation on 02/16/2020 at 10:49 am to Dr. Willy Eddy , who verbally acknowledged these results.  Cardiac Studies:   PCV MYOCARDIAL PERFUSION WITH LEXISCAN 12/09/2020 Lexiscan/modified Bruce nuclear stress test performed using 1-day protocol. Stress EKG is non-diagnostic, as this is pharmacological stress test. In addition, rest and stress EKG showed sinus rhythm, frequent PVC's, nonspecific ST-T changes. SPECT images show small sized, medium intensity, fixed perfusion defect in basal inferior myocardium. Dilated left ventricular cavity with severe global myocardial thickening and wall motion. Stress LVEF 23%. Findings most likely represent dilated cardiomyopathy. High risk study,  Echocardiogram 12/09/2020: Left ventricle cavity is normal in size.  Mild concentric hypertrophy of the left ventricle. Moderate global hypokinesis, frequent PVC's. LVEF 35-40%. Doppler evidence of grade I (impaired) diastolic dysfunction, normal LAP.  Mild (Grade I) mitral regurgitation. Normal right atrial pressure. Previous study on 07/23/2020 reported LVEF 25-30%.   EKG:    EKG 07/16/2020: Normal sinus rhythm with rate of 69 bpm, left atrial enlargement, left axis deviation, left anterior fascicular block.  Incomplete right bundle branch block.  LVH.      Assessment     ICD-10-CM   1. Chronic systolic heart failure (HCC)  V25.36 metoprolol succinate (TOPROL-XL) 100 MG 24 hr tablet    PCV MYOCARDIAL PERFUSION WITH LEXISCAN    PCV ECHOCARDIOGRAM COMPLETE    2. Dilated cardiomyopathy (HCC)  I42.0 PCV MYOCARDIAL PERFUSION WITH LEXISCAN    3. Primary hypertension  I10     4. Nicotine dependence, other tobacco product, w other disorders: Chews tobacco  F17.298     5. Hypovitaminosis D  E55.9 Cholecalciferol (VITAMIN D) 50 MCG (2000 UT) tablet      Medications Discontinued During This Encounter  Medication Reason   potassium chloride (KLOR-CON) 10 MEQ tablet Error   metoprolol succinate (TOPROL-XL) 100 MG 24 hr tablet Reorder    Meds ordered this encounter  Medications   Cholecalciferol (VITAMIN D) 50 MCG (2000 UT) tablet    Sig: Take 1 tablet (2,000 Units total) by mouth daily.    Dispense:  90 tablet    Refill:  1   metoprolol succinate (TOPROL-XL) 100 MG 24 hr tablet    Sig: Take 1.5 tablets (150 mg total) by mouth daily.    Dispense:  135 tablet    Refill:  1   Orders Placed This Encounter  Procedures   PCV MYOCARDIAL PERFUSION WITH LEXISCAN    Standing Status:   Future    Standing Expiration Date:   10/22/2020   PCV ECHOCARDIOGRAM COMPLETE    Standing Status:   Future    Standing Expiration Date:   08/21/2021    Order Specific Question:   Comment    Answer:   Prior to OV in 3 months   Recommendations:   Ryan Gibson is a 45  y.o. Caucasian male patient with hypertension, tobacco use disorder, alcohol abuse, strong family history of premature coronary disease in his father who had coronary artery disease in his late 69s, who is unvaccinated for COVID-19 and appears to have very mild symptoms of Covid 19 infection sometime in October 2021 as his whole family had COVID-19 infection.    He presented to [facility] on 02/16/2020 with right leg swelling and edema and pain, marked dyspnea on exertion and also worsened to a point  of shortness of breath at rest, orthopnea, and was diagnosed with acute bilateral pulmonary emboli and DVT of the right lower extremity and severe dilated cardiomyopathy with EF <20%.  He was also found to have acute cholecystitis due to gallstones.  Extremely complex presentation, his DVT and PE could have been related to possibly COVID-19 infection, although he was tested negative on presentation as it was a delayed presentation  He also has dilated cardiomyopathy, his cardiomyopathy could certainly be related to alcohol abuse.  Patient drinks anywhere from 10-12 bottles of beer on a daily basis and 20-24 bottles of beer on the weekends. He now states he has reduced to about 8-10 beers a day and still chewing tobacco.  His symptoms of dizziness, fatigue and shortness of breath has improved as well.  On physical exam there is no further S3 gallop, no murmur, and by echocardiogram moderately severe MR is now only mild MR.  Overall I see that his LVEF has improved slightly as well and today there is no active congestive heart failure.  I have again discussed with him regarding congestive heart failure, clinical presentation of heart failure, avoidance of excess salt and also certainly complete abstinence from alcohol and also tobacco.  His blood pressure is soft, for now continue moderate dose Entresto but because his heart rate is greater than 70, will increase his Toprol succinate from 100 mg  daily to 150 mg daily which he can either split or take together.  I will also repeat echocardiogram prior to his next office visit in 3 months.  Vitamin D is low, supplement Rx sent. In view of cardiomyopathy and family history of coronary disease, will set him up for Lexiscan nuclear stress test (patient on high-dose beta-blocker, do not want to hold for more than 1 dose).  I will also repeat his echocardiogram in 3 months and see him back at that time.  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  I do not have a contraindication for this.  This was a 40-minute office visit encounter.    Yates Decamp, MD, [facility] 08/21/2020, 3:56 PM Office: [phone]

## 2020-12-16 ENCOUNTER — Ambulatory Visit (INDEPENDENT_AMBULATORY_CARE_PROVIDER_SITE_OTHER): Payer: BC Managed Care – PPO | Admitting: Family Medicine

## 2020-12-16 ENCOUNTER — Telehealth: Payer: Self-pay | Admitting: Family Medicine

## 2020-12-16 ENCOUNTER — Other Ambulatory Visit: Payer: Self-pay

## 2020-12-16 ENCOUNTER — Other Ambulatory Visit: Payer: Self-pay | Admitting: Cardiology

## 2020-12-16 VITALS — BP 111/66 | HR 74 | Ht 73.5 in | Wt 251.8 lb

## 2020-12-16 DIAGNOSIS — E291 Testicular hypofunction: Secondary | ICD-10-CM

## 2020-12-16 DIAGNOSIS — I5020 Unspecified systolic (congestive) heart failure: Secondary | ICD-10-CM | POA: Insufficient documentation

## 2020-12-16 DIAGNOSIS — Z8249 Family history of ischemic heart disease and other diseases of the circulatory system: Secondary | ICD-10-CM | POA: Insufficient documentation

## 2020-12-16 DIAGNOSIS — F321 Major depressive disorder, single episode, moderate: Secondary | ICD-10-CM

## 2020-12-16 DIAGNOSIS — I420 Dilated cardiomyopathy: Secondary | ICD-10-CM | POA: Insufficient documentation

## 2020-12-16 MED ORDER — TESTOSTERONE ENANTHATE 200 MG/ML IJ SOLN: 200.0000 mg | INTRAMUSCULAR | 1 refills | Status: DC

## 2020-12-16 NOTE — Progress Notes (Signed)
Subjective:  Patient ID: Ryan Gibson, male    DOB: 1976-02-20  Age: 45 y.o. MRN: [id number]  CC: Chief Complaint  Patient presents with   follow up on testosterone    Patient states he needs refill of testosterone Establish care    HPI:  45 year old male with a complicated past medical history including systolic heart failure/dilated cardiomyopathy, pulmonary embolism, hypertension, gout, hypogonadism, alcohol abuse presents for follow-up.  Patient would like to restart testosterone therapy.  He went a period of time where he did not have a primary care physician and has been off of it for quite some time.  He has documented hypogonadism with repeat laboratory studies which confirmed.  He has been on testosterone enanthate 200 mg weekly.  He is interested in restarting therapy.  Regarding his heart failure, he is following up closely with cardiology.  Patient Active Problem List   Diagnosis Date Noted   Dilated cardiomyopathy (HCC) 12/16/2020   Systolic CHF (HCC) 12/16/2020   Family history of early CAD 12/16/2020   Pulmonary embolism (HCC) 02/16/2020   Depression, major, single episode, moderate (HCC) 03/07/2018   Hypogonadism male 11/24/2015   Gout 11/01/2014   Essential hypertension 03/10/2014    Social Hx   Social History   Socioeconomic History   Marital status: Married    Spouse name: Not on file   Number of children: 4   Years of education: Not on file   Highest education level: Not on file  Occupational History   Not on file  Tobacco Use   Smoking status: Never   Smokeless tobacco: Current    Types: Chew  Vaping Use   Vaping Use: Never used  Substance and Sexual Activity   Alcohol use: Yes    Comment: Social   Drug use: No   Sexual activity: Yes    Birth control/protection: None    Comment: Married  Other Topics Concern   Not on file  Social History Narrative   Married for 13 years.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   Social Determinants of Health   Financial Resource Strain: Not on file  Food Insecurity: Not on file  Transportation Needs: Not on file  Physical Activity: Not on file  Stress: Not on file  Social Connections: Not on file    Review of Systems  Respiratory: Negative.    Cardiovascular: Negative.   Genitourinary:        Low libido.    Objective:  BP 111/66   Pulse 74   Ht 6' 1.5" (1.867 m)   Wt 251 lb 12.8 oz (114.2 kg)   SpO2 97%   BMI 32.77 kg/m   BP/Weight 12/16/2020 08/21/2020 07/16/2020  Systolic BP 111 109 140  Diastolic BP 66 75 89  Wt. (Lbs) 251.8 243.4 236.8  BMI 32.77 31.68 30.82    Physical Exam Constitutional:      General: He is not in acute distress.    Appearance: Normal appearance. He is not ill-appearing.  HENT:     Head: Normocephalic and atraumatic.  Cardiovascular:     Rate and Rhythm: Normal rate and regular rhythm.     Heart sounds: No murmur heard.    Comments: No lower extremity edema on exam. Pulmonary:     Effort: Pulmonary effort is normal.     Breath sounds: Normal breath sounds. No wheezing, rhonchi or rales.  Neurological:     Mental Status: He is alert.    Lab Results  Component Value Date   WBC 5.9 02/22/2020   HGB 15.2 02/22/2020   HCT 46.8 02/22/2020   PLT 193 02/22/2020   GLUCOSE 86 07/23/2020   CHOL 160 07/23/2020   TRIG 190 (H) 07/23/2020   HDL 50 07/23/2020   LDLDIRECT 82 07/23/2020   LDLCALC 78 07/23/2020   ALT 26 07/23/2020   AST 31 07/23/2020   NA 143 07/23/2020   K 4.5 07/23/2020   CL 102 07/23/2020   CREATININE 0.96 07/23/2020   BUN 12 07/23/2020   CO2 26 07/23/2020   TSH 2.520 07/23/2020   INR 1.4 (H) 02/16/2020     Assessment & Plan:   Problem List Items Addressed This Visit       Endocrine   Hypogonadism male - Primary    Restarting Testosterone. Labs in 3 months.  Follow up in 6 months.      Relevant Orders   CBC   CMP14+EGFR   PSA   Testosterone    Meds ordered this  encounter  Medications   Testosterone Enanthate 200 MG/ML SOLN    Sig: Inject 200 mg as directed once a week.    Dispense:  5 mL    Refill:  1    Follow-up:  Return in about 6 months (around 06/16/2021).  Everlene Other DO [facility]

## 2020-12-16 NOTE — Telephone Encounter (Signed)
Do you want to do this???

## 2020-12-16 NOTE — Telephone Encounter (Signed)
Pt contacted and made aware that once PA is complete it may take a few day to hear from insurance. Pt verbalized understanding. No PA yet on Cover My Meds and no PA sent via fax at this time.

## 2020-12-16 NOTE — Assessment & Plan Note (Signed)
Restarting Testosterone. Labs in 3 months.  Follow up in 6 months.

## 2020-12-16 NOTE — Telephone Encounter (Signed)
Okay to refill? 

## 2020-12-16 NOTE — Patient Instructions (Signed)
Labs in 3 months (1 week after your testosterone injection).  Continue your current medications.  Follow up in 6 months.  Take care  Dr. Adriana Simas

## 2020-12-16 NOTE — Telephone Encounter (Signed)
Patient stated he saw Dr. Adriana Simas this morning and was given a prescription for testosterone. [facility] cold not fill with prior authorization. Please advise.  [employer]  CB#  [phone]

## 2020-12-17 ENCOUNTER — Ambulatory Visit: Payer: BC Managed Care – PPO | Admitting: Cardiology

## 2020-12-17 ENCOUNTER — Encounter: Payer: Self-pay | Admitting: Cardiology

## 2020-12-17 VITALS — BP 120/76 | HR 65 | Temp 98.3°F | Resp 16 | Ht 73.5 in | Wt 253.8 lb

## 2020-12-17 DIAGNOSIS — Z86711 Personal history of pulmonary embolism: Secondary | ICD-10-CM | POA: Diagnosis not present

## 2020-12-17 DIAGNOSIS — I10 Essential (primary) hypertension: Secondary | ICD-10-CM | POA: Diagnosis not present

## 2020-12-17 DIAGNOSIS — I5022 Chronic systolic (congestive) heart failure: Secondary | ICD-10-CM | POA: Diagnosis not present

## 2020-12-17 DIAGNOSIS — I420 Dilated cardiomyopathy: Secondary | ICD-10-CM

## 2020-12-17 NOTE — Telephone Encounter (Signed)
Await decision from insurance 

## 2020-12-18 ENCOUNTER — Other Ambulatory Visit: Payer: Self-pay | Admitting: Cardiology

## 2020-12-18 DIAGNOSIS — I5022 Chronic systolic (congestive) heart failure: Secondary | ICD-10-CM

## 2020-12-20 ENCOUNTER — Telehealth: Payer: BC Managed Care – PPO | Admitting: Emergency Medicine

## 2020-12-20 DIAGNOSIS — R051 Acute cough: Secondary | ICD-10-CM

## 2020-12-20 DIAGNOSIS — R197 Diarrhea, unspecified: Secondary | ICD-10-CM

## 2020-12-20 DIAGNOSIS — R112 Nausea with vomiting, unspecified: Secondary | ICD-10-CM | POA: Diagnosis not present

## 2020-12-20 DIAGNOSIS — R6889 Other general symptoms and signs: Secondary | ICD-10-CM

## 2020-12-20 MED ORDER — BENZONATATE 100 MG PO CAPS: 100.0000 mg | ORAL_CAPSULE | Freq: Two times a day (BID) | ORAL | 0 refills | Status: DC | PRN

## 2020-12-20 MED ORDER — OSELTAMIVIR PHOSPHATE 75 MG PO CAPS: 75.0000 mg | ORAL_CAPSULE | Freq: Two times a day (BID) | ORAL | 0 refills | Status: AC

## 2020-12-20 MED ORDER — ONDANSETRON HCL 4 MG PO TABS: 4.0000 mg | ORAL_TABLET | Freq: Three times a day (TID) | ORAL | 0 refills | Status: DC | PRN

## 2020-12-20 NOTE — Progress Notes (Signed)
Virtual Visit Consent   Ryan Gibson, you are scheduled for a virtual visit with a [facility] provider today.     Just as with appointments in the office, your consent must be obtained to participate.  Your consent will be active for this visit and any virtual visit you may have with one of our providers in the next 365 days.     If you have a MyChart account, a copy of this consent can be sent to you electronically.  All virtual visits are billed to your insurance company just like a traditional visit in the office.    As this is a virtual visit, video technology does not allow for your provider to perform a traditional examination.  This may limit your provider's ability to fully assess your condition.  If your provider identifies any concerns that need to be evaluated in person or the need to arrange testing (such as labs, EKG, etc.), we will make arrangements to do so.     Although advances in technology are sophisticated, we cannot ensure that it will always work on either your end or our end.  If the connection with a video visit is poor, the visit may have to be switched to a telephone visit.  With either a video or telephone visit, we are not always able to ensure that we have a secure connection.     I need to obtain your verbal consent now.   Are you willing to proceed with your visit today?    Ryan Gibson has provided verbal consent on 12/20/2020 for a virtual visit (video or telephone).   Rennis Harding, [place]   Date: 12/20/2020 8:50 AM   Virtual Visit via Video Note   I, Rennis Harding, connected with  Ryan Gibson  ([id number], Mar 30, 1975) on 12/20/20 at  8:45 AM EDT by a video-enabled telemedicine application and verified that I am speaking with the correct person using two identifiers.  Location: Patient: Virtual Visit Location Patient: Home Provider: Virtual Visit Location Provider: Home Office   I discussed the limitations of evaluation and  management by telemedicine and the availability of in person appointments. The patient expressed understanding and agreed to proceed.    History of Present Illness: Ryan Gibson is a 45 y.o. who identifies as a male who was assigned male at birth, and is being seen today for headache, body aches, vomiting, cough, sore throat, and diarrhea x 3 days.  Reports sick exposure at work.  Has tried OTC medications without relief.  Denies aggravating factors.  Reports previous symptoms in the past with flu.   Denies SOB, wheezing, chest pain, changes in bladder habits.    ROS: As per HPI.  All other pertinent ROS negative.     HPI: HPI  Problems:  Patient Active Problem List   Diagnosis Date Noted   Dilated cardiomyopathy (HCC) 12/16/2020   Systolic CHF (HCC) 12/16/2020   Family history of early CAD 12/16/2020   Pulmonary embolism (HCC) 02/16/2020   Depression, major, single episode, moderate (HCC) 03/07/2018   Hypogonadism male 11/24/2015   Gout 11/01/2014   Essential hypertension 03/10/2014    Allergies:  Allergies  Allergen Reactions   No Known Allergies    Vancomycin Hives    hi8vess post op on R arm -site of infusion, and whole body itching   Medications:  Current Outpatient Medications:    apixaban (ELIQUIS) 5 MG TABS tablet, Take 1 tablet (5 mg total) by mouth 2 (two)  times daily. Only start this script after taking eliquis 10mg  BID x 6 days, Disp: 60 tablet, Rfl: 2   colchicine 0.6 MG tablet, Take 0.6 mg by mouth daily., Disp: , Rfl:    ENTRESTO 49-51 MG, TAKE 1 TABLET BY MOUTH TWICE DAILY, Disp: 60 tablet, Rfl: 3   HYDROcodone-acetaminophen (NORCO) 7.5-325 MG tablet, Take 1 tablet by mouth every 6 (six) hours as needed. NEED FOR RA & GOUT. FLAW UP  UNTIL HE GET IN TO SEE HER IN 2 MTHS., Disp: , Rfl:    JARDIANCE 10 MG TABS tablet, Take 10 mg by mouth daily., Disp: , Rfl:    metoprolol succinate (TOPROL-XL) 100 MG 24 hr tablet, Take 1.5 tablets (150 mg total) by mouth daily.,  Disp: 135 tablet, Rfl: 1   rosuvastatin (CRESTOR) 20 MG tablet, Take 20 mg by mouth daily., Disp: , Rfl:    sertraline (ZOLOFT) 50 MG tablet, TAKE 1 TABLET(50 MG) BY MOUTH DAILY, Disp: 90 tablet, Rfl: 1   Testosterone Enanthate 200 MG/ML SOLN, Inject 200 mg as directed once a week., Disp: 5 mL, Rfl: 1   VERQUVO 10 MG TABS, Take 1 tablet by mouth daily., Disp: , Rfl:   Observations/Objective: Patient is well-developed, well-nourished in no acute distress.  Resting comfortably  at home.  Mildly fatigued appearing, but nontoxic  Head is normocephalic, atraumatic.  No labored breathing. Speaking in full sentences and tolerating own secretions Speech is clear and coherent with logical content.  Patient is alert and oriented at baseline.   Assessment and Plan: 1. Flu-like symptoms  2. Nausea and vomiting, unspecified vomiting type  3. Acute cough  4. Diarrhea, unspecified type  Get plenty of rest and push fluids.  Supplement with OTC Pedialyte as needed Tamiflu prescribed.  Take as directed and to completion for flu like symptoms Zofran for nausea and vomiting prescribed Tessalon perles prescribed for cough Use zyrtec for nasal congestion, runny nose, and/or sore throat Use flonase for nasal congestion and runny nose Use medications daily for symptom relief Use OTC medications like ibuprofen or tylenol as needed fever or pain Follow up with PCP in 1-2 days via phone or e-visit for recheck and to ensure symptoms are improving Call or go to the ED if you have any new or worsening symptoms such as fever, worsening cough, shortness of breath, chest tightness, chest pain, turning blue, changes in mental status, etc...    Follow Up Instructions: I discussed the assessment and treatment plan with the patient. The patient was provided an opportunity to ask questions and all were answered. The patient agreed with the plan and demonstrated an understanding of the instructions.  A copy of  instructions were sent to the patient via MyChart unless otherwise noted below.   The patient was advised to call back or seek an in-person evaluation if the symptoms worsen or if the condition fails to improve as anticipated.  Time:  I spent 10 minutes with the patient via telehealth technology discussing the above problems/concerns.    Rennis Harding, PA-C

## 2020-12-20 NOTE — Patient Instructions (Signed)
Ryan Gibson, thank you for joining Rennis Harding, PA-C for today's virtual visit.  While this provider is not your primary care provider (PCP), if your PCP is located in our provider database this encounter information will be shared with them immediately following your visit.  Consent: (Patient) Ryan Gibson provided verbal consent for this virtual visit at the beginning of the encounter.  Current Medications:  Current Outpatient Medications:    benzonatate (TESSALON) 100 MG capsule, Take 1 capsule (100 mg total) by mouth 2 (two) times daily as needed for cough., Disp: 20 capsule, Rfl: 0   ondansetron (ZOFRAN) 4 MG tablet, Take 1 tablet (4 mg total) by mouth every 8 (eight) hours as needed for nausea or vomiting., Disp: 20 tablet, Rfl: 0   oseltamivir (TAMIFLU) 75 MG capsule, Take 1 capsule (75 mg total) by mouth 2 (two) times daily for 5 days., Disp: 10 capsule, Rfl: 0   apixaban (ELIQUIS) 5 MG TABS tablet, Take 1 tablet (5 mg total) by mouth 2 (two) times daily. Only start this script after taking eliquis 10mg  BID x 6 days, Disp: 60 tablet, Rfl: 2   colchicine 0.6 MG tablet, Take 0.6 mg by mouth daily., Disp: , Rfl:    ENTRESTO 49-51 MG, TAKE 1 TABLET BY MOUTH TWICE DAILY, Disp: 60 tablet, Rfl: 3   HYDROcodone-acetaminophen (NORCO) 7.5-325 MG tablet, Take 1 tablet by mouth every 6 (six) hours as needed. NEED FOR RA & GOUT. FLAW UP  UNTIL HE GET IN TO SEE HER IN 2 MTHS., Disp: , Rfl:    JARDIANCE 10 MG TABS tablet, Take 10 mg by mouth daily., Disp: , Rfl:    metoprolol succinate (TOPROL-XL) 100 MG 24 hr tablet, Take 1.5 tablets (150 mg total) by mouth daily., Disp: 135 tablet, Rfl: 1   rosuvastatin (CRESTOR) 20 MG tablet, Take 20 mg by mouth daily., Disp: , Rfl:    sertraline (ZOLOFT) 50 MG tablet, TAKE 1 TABLET(50 MG) BY MOUTH DAILY, Disp: 90 tablet, Rfl: 1   Testosterone Enanthate 200 MG/ML SOLN, Inject 200 mg as directed once a week., Disp: 5 mL, Rfl: 1   VERQUVO 10 MG  TABS, Take 1 tablet by mouth daily., Disp: , Rfl:    Medications ordered in this encounter:  Meds ordered this encounter  Medications   oseltamivir (TAMIFLU) 75 MG capsule    Sig: Take 1 capsule (75 mg total) by mouth 2 (two) times daily for 5 days.    Dispense:  10 capsule    Refill:  0    Order Specific Question:   Supervising Provider    Answer:   MILLER, BRIAN [[id number]]   benzonatate (TESSALON) 100 MG capsule    Sig: Take 1 capsule (100 mg total) by mouth 2 (two) times daily as needed for cough.    Dispense:  20 capsule    Refill:  0    Order Specific Question:   Supervising Provider    Answer:   MILLER, BRIAN [[id number]]   ondansetron (ZOFRAN) 4 MG tablet    Sig: Take 1 tablet (4 mg total) by mouth every 8 (eight) hours as needed for nausea or vomiting.    Dispense:  20 tablet    Refill:  0    Order Specific Question:   Supervising Provider    Answer:   06-21-1976, BRIAN [[id number]]     *If you need refills on other medications prior to your next appointment, please contact your pharmacy*  Follow-Up: Call back or seek  an in-person evaluation if the symptoms worsen or if the condition fails to improve as anticipated.  Other Instructions  Get plenty of rest and push fluids.  Supplement with OTC Pedialyte as needed Tamiflu prescribed.  Take as directed and to completion for flu like symptoms Zofran for nausea and vomiting prescribed Tessalon perles prescribed for cough Use zyrtec for nasal congestion, runny nose, and/or sore throat Use flonase for nasal congestion and runny nose Use medications daily for symptom relief Use OTC medications like ibuprofen or tylenol as needed fever or pain Follow up with PCP in 1-2 days via phone or e-visit for recheck and to ensure symptoms are improving Call or go to the ED if you have any new or worsening symptoms such as fever, worsening cough, shortness of breath, chest tightness, chest pain, turning blue, changes in mental status, etc...     If you  have been instructed to have an in-person evaluation today at a local Urgent Care facility, please use the link below. It will take you to a list of all of our available [facility], including address, phone number and hours of operation. Please do not delay care.  [facility]  If you or a family member do not have a primary care provider, use the link below to schedule a visit and establish care. When you choose a [facility] primary care physician or advanced practice provider, you gain a long-term partner in health. Find a Primary Care Provider  Learn more about [facility] in-office and virtual care options: [facility] - Get Care Now

## 2020-12-21 NOTE — Telephone Encounter (Signed)
From pt

## 2020-12-22 NOTE — Telephone Encounter (Signed)
Still waiting on insurance determination  

## 2020-12-23 NOTE — Telephone Encounter (Signed)
Contacted pt insurance. Pt only has medial benefits not pharmacy benefits. Sent pt a my chart message.

## 2020-12-25 NOTE — Telephone Encounter (Signed)
Pt replied via my chart that he would be contacted insurance. Sent follow up my chart message today.

## 2020-12-25 NOTE — Telephone Encounter (Signed)
Form faxed over from [facility]. Pt will need to have labs done so the results can be faxed with form. Form in provider office for signature. Will fax once lab results are in.

## 2020-12-30 DIAGNOSIS — E291 Testicular hypofunction: Secondary | ICD-10-CM | POA: Diagnosis not present

## 2020-12-31 LAB — CMP14+EGFR
ALT: 24 IU/L (ref 0–44)
AST: 24 IU/L (ref 0–40)
Albumin/Globulin Ratio: 2 (ref 1.2–2.2)
Albumin: 4.7 g/dL (ref 4.0–5.0)
Alkaline Phosphatase: 46 IU/L (ref 44–121)
BUN/Creatinine Ratio: 12 (ref 9–20)
BUN: 10 mg/dL (ref 6–24)
Bilirubin Total: 0.7 mg/dL (ref 0.0–1.2)
CO2: 24 mmol/L (ref 20–29)
Calcium: 8.9 mg/dL (ref 8.7–10.2)
Chloride: 102 mmol/L (ref 96–106)
Creatinine, Ser: 0.84 mg/dL (ref 0.76–1.27)
Globulin, Total: 2.4 g/dL (ref 1.5–4.5)
Glucose: 84 mg/dL (ref 70–99)
Potassium: 4 mmol/L (ref 3.5–5.2)
Sodium: 143 mmol/L (ref 134–144)
Total Protein: 7.1 g/dL (ref 6.0–8.5)
eGFR: 110 mL/min/{1.73_m2} (ref >59)

## 2020-12-31 LAB — CBC
Hematocrit: 41.6 % (ref 37.5–51.0)
Hemoglobin: 14.8 g/dL (ref 13.0–17.7)
MCH: 33.7 pg — ABNORMAL HIGH (ref 26.6–33.0)
MCHC: 35.6 g/dL (ref 31.5–35.7)
MCV: 95 fL (ref 79–97)
Platelets: 298 10*3/uL (ref 150–450)
RBC: 4.39 x10E6/uL (ref 4.14–5.80)
RDW: 12.5 % (ref 11.6–15.4)
WBC: 8.6 10*3/uL (ref 3.4–10.8)

## 2020-12-31 LAB — TESTOSTERONE: Testosterone: 221 ng/dL — ABNORMAL LOW (ref 264–916)

## 2020-12-31 LAB — PSA: Prostate Specific Ag, Serum: 0.7 ng/mL (ref 0.0–4.0)

## 2021-01-22 ENCOUNTER — Encounter: Payer: Self-pay | Admitting: Cardiology

## 2021-01-25 ENCOUNTER — Other Ambulatory Visit: Payer: Self-pay

## 2021-01-25 DIAGNOSIS — I5022 Chronic systolic (congestive) heart failure: Secondary | ICD-10-CM

## 2021-01-25 DIAGNOSIS — I2699 Other pulmonary embolism without acute cor pulmonale: Secondary | ICD-10-CM

## 2021-01-25 MED ORDER — JARDIANCE 10 MG PO TABS: 10.0000 mg | ORAL_TABLET | Freq: Every day | ORAL | 0 refills | Status: DC

## 2021-01-25 MED ORDER — APIXABAN 5 MG PO TABS: 5.0000 mg | ORAL_TABLET | Freq: Two times a day (BID) | ORAL | 1 refills | Status: DC

## 2021-01-25 MED ORDER — METOPROLOL SUCCINATE ER 100 MG PO TB24: 150.0000 mg | ORAL_TABLET | Freq: Every day | ORAL | 1 refills | Status: DC

## 2021-01-25 MED ORDER — ENTRESTO 49-51 MG PO TABS: 1.0000 | ORAL_TABLET | Freq: Two times a day (BID) | ORAL | 1 refills | Status: DC

## 2021-01-25 MED ORDER — ROSUVASTATIN CALCIUM 20 MG PO TABS: 20.0000 mg | ORAL_TABLET | Freq: Every day | ORAL | 0 refills | Status: DC

## 2021-03-02 ENCOUNTER — Encounter: Payer: Self-pay | Admitting: Cardiology

## 2021-03-07 ENCOUNTER — Other Ambulatory Visit: Payer: Self-pay | Admitting: Cardiology

## 2021-03-07 DIAGNOSIS — I5022 Chronic systolic (congestive) heart failure: Secondary | ICD-10-CM

## 2021-03-08 ENCOUNTER — Telehealth: Payer: Self-pay | Admitting: Family Medicine

## 2021-03-08 MED ORDER — TESTOSTERONE ENANTHATE 200 MG/ML IJ SOLN: 200.0000 mg | INTRAMUSCULAR | 1 refills | Status: DC

## 2021-03-08 NOTE — Telephone Encounter (Signed)
Patient has switched pharmacy to [facility] [address]. He needs a new prescription sent to them for his testosterone  CB# [phone]

## 2021-03-08 NOTE — Telephone Encounter (Signed)
Refill sent to [facility]. Left message on voicemail making pt aware med has been sent in.

## 2021-03-09 ENCOUNTER — Telehealth: Payer: Self-pay | Admitting: Family Medicine

## 2021-03-09 ENCOUNTER — Other Ambulatory Visit: Payer: Self-pay

## 2021-03-09 DIAGNOSIS — F321 Major depressive disorder, single episode, moderate: Secondary | ICD-10-CM

## 2021-03-09 MED ORDER — TESTOSTERONE ENANTHATE 200 MG/ML IJ SOLN: 200.0000 mg | INTRAMUSCULAR | 1 refills | Status: DC

## 2021-03-09 MED ORDER — SERTRALINE HCL 50 MG PO TABS: ORAL_TABLET | ORAL | 1 refills | Status: DC

## 2021-03-09 NOTE — Telephone Encounter (Signed)
Pt replied via my chart °

## 2021-03-09 NOTE — Telephone Encounter (Signed)
Patient is requesting refill on sertraline 50 mg called into [facility]. He has his 6 month follow up on 06/16/21

## 2021-03-09 NOTE — Telephone Encounter (Signed)
Sertraline sent to pharmacy. Pt also called yesterday regarding Testosterone script being sent to [facility]. [facility] and again today due to pharmacy stating they did not have it. Left message to return call. Will also send my chart message

## 2021-03-17 ENCOUNTER — Ambulatory Visit: Payer: BC Managed Care – PPO | Admitting: Student

## 2021-03-22 ENCOUNTER — Telehealth: Payer: Self-pay | Admitting: Family Medicine

## 2021-03-22 NOTE — Telephone Encounter (Signed)
[facility] stating that pharmacy is unable to get testosterone enanthate at this time. Please change to Testosterone Cypionate if possible. Please advise. Thank you

## 2021-03-22 NOTE — Telephone Encounter (Signed)
Please see other telephone counter. Faxed received and nurse sent message to provider.

## 2021-03-22 NOTE — Telephone Encounter (Signed)
Pt called and stated he needs the synthetic testosterone and not the non synthetic. [facility]. He says the pharmacy is supposed to fax something over. [phone]

## 2021-03-23 ENCOUNTER — Other Ambulatory Visit: Payer: Self-pay

## 2021-03-23 ENCOUNTER — Other Ambulatory Visit: Payer: Self-pay | Admitting: Family Medicine

## 2021-03-23 ENCOUNTER — Encounter: Payer: Self-pay | Admitting: Student

## 2021-03-23 ENCOUNTER — Ambulatory Visit: Payer: BC Managed Care – PPO | Admitting: Student

## 2021-03-23 VITALS — BP 155/112 | HR 88 | Temp 98.2°F | Ht 73.5 in | Wt 265.0 lb

## 2021-03-23 DIAGNOSIS — I420 Dilated cardiomyopathy: Secondary | ICD-10-CM

## 2021-03-23 DIAGNOSIS — I10 Essential (primary) hypertension: Secondary | ICD-10-CM | POA: Diagnosis not present

## 2021-03-23 DIAGNOSIS — E291 Testicular hypofunction: Secondary | ICD-10-CM

## 2021-03-23 DIAGNOSIS — I5022 Chronic systolic (congestive) heart failure: Secondary | ICD-10-CM | POA: Diagnosis not present

## 2021-03-23 DIAGNOSIS — I421 Obstructive hypertrophic cardiomyopathy: Secondary | ICD-10-CM | POA: Diagnosis not present

## 2021-03-23 DIAGNOSIS — F17298 Nicotine dependence, other tobacco product, with other nicotine-induced disorders: Secondary | ICD-10-CM

## 2021-03-23 DIAGNOSIS — F17210 Nicotine dependence, cigarettes, uncomplicated: Secondary | ICD-10-CM | POA: Diagnosis not present

## 2021-03-23 MED ORDER — SPIRONOLACTONE 25 MG PO TABS: 12.5000 mg | ORAL_TABLET | Freq: Every day | ORAL | 3 refills | Status: DC

## 2021-03-23 MED ORDER — FUROSEMIDE 20 MG PO TABS: 20.0000 mg | ORAL_TABLET | Freq: Every day | ORAL | 3 refills | Status: DC | PRN

## 2021-03-23 NOTE — Telephone Encounter (Signed)
Left message to return call 

## 2021-03-23 NOTE — Progress Notes (Signed)
Primary Physician/Referring:  Tommie Sams, DO  Patient ID: Ryan Gibson, male    DOB: 03-17-1975, 47 y.o.   MRN: [id number]  Chief Complaint  Patient presents with   HFrEF   Follow-up   HPI:    Ryan Gibson  is a 46 y.o. Caucasian male patient with Gibson, Ryan Gibson, Ryan Gibson, Ryan family history of premature coronary disease in his father who had coronary artery disease in his late 75s.   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  Suspected Ryan Ryan PE are related to underlying COVID-19 infection.  Cardiomyopathy likely related to Ryan Gibson.  Patient was initiated on guideline directed medical therapy including Jardiance, metoprolol, Ryan Entresto.  Since since the admission in 01/2020 patient has reduced his Ryan intake, now drinking approximately 1 beer per day, unfortunately he does continue to chew Ryan daily.  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  Repeat echocardiogram in 11/2020 showed LVEF improved to 35-40% with grade 1 diastolic dysfunction Ryan only mild MR.  Patient now presents for 35-month follow-up.  At last office visit heart rate was >70 bpm, therefore Toprol-XL was increased 250 mg daily which he is tolerating without issue.  Patient does admit to dietary noncompliance stating he has been eating [employer] every day for breakfast.   Denies chest pain, orthopnea, dyspnea, leg edema.  Past Medical History:  Diagnosis Date   Asthma    AS A CHILD ONLY   Foot pain, right    painful x 1 month Ryan swelling   Past Surgical History:  Procedure Laterality Date   HERNIA REPAIR  07/13/2011   [facility]   INGUINAL HERNIA REPAIR  07/13/2011   Procedure: HERNIA REPAIR INGUINAL ADULT;  Surgeon: Wilmon Arms. Corliss Skains, MD;  Location: [facility];   Service: General;  Laterality: Right;   NO PREVIOUS SURGERY     Family History  Problem Relation Age of Onset   Healthy Mother    Heart disease Father     Social History   Ryan Use   Smoking status: Never   Smokeless Ryan: Current    Types: Chew  Substance Use Topics   Ryan use: Yes    Comment: Social   Marital Status: Married  ROS  Review of Systems  Cardiovascular:  Negative for chest pain, dyspnea on exertion, leg swelling, orthopnea Ryan paroxysmal nocturnal dyspnea.  Gastrointestinal:  Negative for melena.  Neurological:  Negative for dizziness.   Objective  Blood pressure (!) 155/112, pulse 88, temperature 98.2 F (36.8 C), temperature source Temporal, height 6' 1.5" (1.867 m), weight 265 lb (120.2 kg), SpO2 97 %. Body mass index is 34.49 kg/m.   Vitals with BMI 03/23/2021 03/23/2021 12/17/2020  Height - 6' 1.5" 6' 1.5"  Weight - 265 lbs 253 lbs 13 oz  BMI - 34.48 33.03  Systolic 155 167 295  Diastolic 112 116 76  Pulse 88 84 65     Physical Exam Vitals reviewed.  Constitutional:      Appearance: He is obese.  Neck:     Vascular: No carotid bruit or JVD.  Cardiovascular:     Rate Ryan Rhythm: Normal rate Ryan regular rhythm.     Pulses: Normal pulses Ryan intact distal pulses.     Heart sounds: No murmur heard.  No gallop.     Comments: Varicose veins bilateral lower extremities, left worse Pulmonary:     Effort: Pulmonary effort is normal.     Breath sounds: Normal breath sounds.  Musculoskeletal:     Right lower leg: No edema.     Left lower leg: No edema.    Laboratory examination:   Recent Labs    07/23/20 1015 12/30/20 1639  NA 143 143  K 4.5 4.0  CL 102 102  CO2 26 24  GLUCOSE 86 84  BUN 12 10  CREATININE 0.96 0.84  CALCIUM 9.3 8.9   CrCl cannot be calculated (Patient's most recent lab result is older than the maximum 21 days allowed.).  CMP Latest Ref Rng & Units 12/30/2020 07/23/2020 02/22/2020  Glucose 70 - 99 mg/dL 84 86 80   BUN 6 - 24 mg/dL 10 12 8   Creatinine 0.76 - 1.27 mg/dL 1.61 0.96 0.45  Sodium 134 - 144 mmol/L 143 143 134(L)  Potassium 3.5 - 5.2 mmol/L 4.0 4.5 3.7  Chloride 96 - 106 mmol/L 102 102 91(L)  CO2 20 - 29 mmol/L 24 26 34(H)  Calcium 8.7 - 10.2 mg/dL 8.9 9.3 4.0(J)  Total Protein 6.0 - 8.5 g/dL 7.1 7.0 8.1(X)  Total Bilirubin 0.0 - 1.2 mg/dL 0.7 0.9 9.1(Y)  Alkaline Phos 44 - 121 IU/L 46 46 50  AST 0 - 40 IU/L 24 31 330(H)  ALT 0 - 44 IU/L 24 26 833(H)   CBC Latest Ref Rng & Units 12/30/2020 02/22/2020 02/21/2020  WBC 3.4 - 10.8 x10E3/uL 8.6 5.9 6.8  Hemoglobin 13.0 - 17.7 g/dL 78.2 95.6 21.3  Hematocrit 37.5 - 51.0 % 41.6 46.8 47.2  Platelets 150 - 450 x10E3/uL 298 193 200   Lipid Panel Recent Labs    07/23/20 1015  CHOL 160  TRIG 190*  LDLCALC 78  HDL 50  LDLDIRECT 82   Lipid Panel     Component Value Date/Time   CHOL 160 07/23/2020 1015   TRIG 190 (H) 07/23/2020 1015   HDL 50 07/23/2020 1015   CHOLHDL 5.6 (H) 11/20/2019 1528   LDLCALC 78 07/23/2020 1015   LDLDIRECT 82 07/23/2020 1015   LABVLDL 32 07/23/2020 1015     HEMOGLOBIN A1C No results found for: HGBA1C, MPG TSH Recent Labs    07/23/20 1015  TSH 2.520    Medications Ryan allergies   Allergies  Allergen Reactions   Vancomycin Hives    hives post op on R arm -site of infusion, Ryan whole body itching    Medication prior to this encounter:   Outpatient Medications Prior to Visit  Medication Sig Dispense Refill   apixaban (ELIQUIS) 5 MG TABS tablet Take 1 tablet (5 mg total) by mouth 2 (two) times daily. Only start this script after taking eliquis 10mg  BID x 6 days 180 tablet 1   colchicine 0.6 MG tablet Take 0.6 mg by mouth daily.     HYDROcodone-acetaminophen (NORCO) 7.5-325 MG tablet Take 1 tablet by mouth every 6 (six) hours as needed. NEED FOR RA & GOUT. FLAW UP  UNTIL HE GET IN TO SEE HER IN 2 MTHS.     JARDIANCE 10 MG TABS tablet Take 1 tablet (10 mg total) by mouth daily. 90 tablet 0    metoprolol succinate (TOPROL-XL) 100 MG 24 hr tablet TAKE 1 Ryan 1/2 TABLETS(150 MG) BY MOUTH DAILY 135 tablet 1   ondansetron (ZOFRAN) 4 MG tablet Take 1 tablet (4 mg total) by mouth every 8 (  eight) hours as needed for nausea or vomiting. 20 tablet 0   rosuvastatin (CRESTOR) 20 MG tablet Take 1 tablet (20 mg total) by mouth daily. 90 tablet 0   sacubitril-valsartan (ENTRESTO) 49-51 MG Take 1 tablet by mouth 2 (two) times daily. 180 tablet 1   sertraline (ZOLOFT) 50 MG tablet TAKE 1 TABLET(50 MG) BY MOUTH DAILY 90 tablet 1   Testosterone Enanthate 200 MG/ML SOLN Inject 200 mg as directed once a week. 5 mL 1   VERQUVO 10 MG TABS Take 1 tablet by mouth daily.     benzonatate (TESSALON) 100 MG capsule Take 1 capsule (100 mg total) by mouth 2 (two) times daily as needed for cough. 20 capsule 0   No facility-administered medications prior to visit.    FINAL MEDICATION AS OF TODAY:   Current Meds  Medication Sig   apixaban (ELIQUIS) 5 MG TABS tablet Take 1 tablet (5 mg total) by mouth 2 (two) times daily. Only start this script after taking eliquis 10mg  BID x 6 days   colchicine 0.6 MG tablet Take 0.6 mg by mouth daily.   furosemide (LASIX) 20 MG tablet Take 1 tablet (20 mg total) by mouth daily as needed for fluid or edema.   HYDROcodone-acetaminophen (NORCO) 7.5-325 MG tablet Take 1 tablet by mouth every 6 (six) hours as needed. NEED FOR RA & GOUT. FLAW UP  UNTIL HE GET IN TO SEE HER IN 2 MTHS.   JARDIANCE 10 MG TABS tablet Take 1 tablet (10 mg total) by mouth daily.   metoprolol succinate (TOPROL-XL) 100 MG 24 hr tablet TAKE 1 Ryan 1/2 TABLETS(150 MG) BY MOUTH DAILY   ondansetron (ZOFRAN) 4 MG tablet Take 1 tablet (4 mg total) by mouth every 8 (eight) hours as needed for nausea or vomiting.   rosuvastatin (CRESTOR) 20 MG tablet Take 1 tablet (20 mg total) by mouth daily.   sacubitril-valsartan (ENTRESTO) 49-51 MG Take 1 tablet by mouth 2 (two) times daily.   sertraline (ZOLOFT) 50 MG tablet TAKE 1  TABLET(50 MG) BY MOUTH DAILY   spironolactone (ALDACTONE) 25 MG tablet Take 0.5 tablets (12.5 mg total) by mouth daily.   Testosterone Enanthate 200 MG/ML SOLN Inject 200 mg as directed once a week.   VERQUVO 10 MG TABS Take 1 tablet by mouth daily.    Radiology:   CT angiogram 02/16/2020: Bilateral pulmonary Gibson worst in the right lower lobe. No findings to suggest right heart strain are noted.   Bilateral patchy infiltrates likely related to the underlying Gibson Ryan may represent early sequelae of pulmonary infarct.   Critical Value/emergent results were called by telephone at the time of interpretation on 02/16/2020 at 10:49 am to Dr. Willy Eddy , who verbally acknowledged these results.  Cardiac Studies:   PCV MYOCARDIAL PERFUSION WITH LEXISCAN 12/09/2020 Lexiscan/modified Bruce nuclear stress test performed using 1-day protocol. Stress EKG is non-diagnostic, as this is pharmacological stress test. In addition, rest Ryan stress EKG showed sinus rhythm, frequent PVC's, nonspecific ST-T changes. SPECT images show small sized, medium intensity, fixed perfusion defect in basal inferior myocardium. Gibson left ventricular cavity with severe global myocardial thickening Ryan wall motion. Stress LVEF 23%. Findings most likely represent Gibson cardiomyopathy. High risk study,  Echocardiogram 12/09/2020: Left ventricle cavity is normal in size. Mild concentric hypertrophy of the left ventricle. Moderate global hypokinesis, frequent PVC's. LVEF 35-40%. Doppler evidence of grade I (impaired) diastolic dysfunction, normal LAP.  Mild (Grade I) mitral regurgitation. Normal right atrial pressure. Previous study  on 07/23/2020 reported LVEF 25-30%.   EKG:  03/23/2021: Sinus rhythm rate of 70 bpm.  Normal axis.  Incomplete right bundle branch block.  Borderline LVH criteria.  EKG 07/16/2020: Normal sinus rhythm with rate of 69 bpm, left atrial enlargement, left axis deviation, left  anterior fascicular block.  Incomplete right bundle branch block.  LVH.      Assessment     ICD-10-CM   1. Chronic systolic heart failure (HCC)  H08.65 EKG 12-Lead    PCV ECHOCARDIOGRAM COMPLETE    Basic metabolic panel    2. Primary Gibson  I10 EKG 12-Lead    Basic metabolic panel    3. Gibson cardiomyopathy (HCC)  I42.0     4. Nicotine dependence, other Ryan product, w other disorders: Chews Ryan  F17.298       Medications Discontinued During This Encounter  Medication Reason   benzonatate (TESSALON) 100 MG capsule     Meds ordered this encounter  Medications   furosemide (LASIX) 20 MG tablet    Sig: Take 1 tablet (20 mg total) by mouth daily as needed for fluid or edema.    Dispense:  30 tablet    Refill:  3   spironolactone (ALDACTONE) 25 MG tablet    Sig: Take 0.5 tablets (12.5 mg total) by mouth daily.    Dispense:  45 tablet    Refill:  3   Orders Placed This Encounter  Procedures   Basic metabolic panel   EKG 12-Lead   PCV ECHOCARDIOGRAM COMPLETE    Standing Status:   Future    Standing Expiration Date:   03/23/2022   Recommendations:   Manoa Andress is a 46 y.o. Caucasian male patient with Gibson, Ryan Gibson, Ryan Gibson, Ryan family history of premature coronary disease in his father who had coronary artery disease in his late 65s.   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  Suspected Ryan Ryan PE are related to underlying COVID-19 infection.  Cardiomyopathy likely related to Ryan Gibson.  Patient was initiated on guideline directed medical therapy.   Patient is congratulated on his reduced Ryan intake Ryan encouraged to continue to work on Ryan cessation.  Spent 4 minutes of today's office visit counseling regarding Ryan cessation.  Patient is  clinically euvolemic without evidence of acute heart failure at today's office visit.  However he does report dietary noncompliance.  Reiterated the importance of DASH diet as well as medication Ryan dietary compliance.  Also encourage patient to increase physical activity as he is sedentary.  Patient's blood pressure is uncontrolled, likely due to high sodium intake.  Given history of cardiomyopathy will add spironolactone 12.5 mg daily with repeat BMP in 1 week both for Gibson Ryan heart failure.  I have also prescribed furosemide to be taken as needed, patient verbalized understanding of as needed dosing of furosemide.  Patient's heart rate has improved Ryan is now better controlled with up titration of beta-blocker therapy at last office visit.  We will plan to repeat echocardiogram in 3 months prior to next office visit to reevaluate LVEF with up titration of guideline directed medical therapy.  Follow-up in 4 to 5 months, sooner if needed.   Rayford Halsted, PA-C 03/23/2021, 2:52 PM Office: [phone]

## 2021-03-23 NOTE — Telephone Encounter (Signed)
We have had so much difficulty with testosterone and getting this approved for him. Additionally, given his current cardiac issues and history of PE, I think its best if I refer him to Urology to manage hypogonadism.  I will place referral.   Thank you   Dr. Lacinda Axon

## 2021-03-23 NOTE — Telephone Encounter (Signed)
Patient notified and verbalized understanding. 

## 2021-04-06 ENCOUNTER — Ambulatory Visit: Payer: BC Managed Care – PPO | Admitting: Urology

## 2021-04-06 NOTE — Progress Notes (Deleted)
° °  Assessment: 1. Hypogonadism male      Plan: ***  Chief Complaint: No chief complaint on file.   History of Present Illness:  Ryan Gibson is a 46 y.o. year old male who is seen in consultation from [place], DO  for evaluation of ***.   Past Medical History:  Past Medical History:  Diagnosis Date   Asthma    AS A CHILD ONLY   Foot pain, right    painful x 1 month and swelling    Past Surgical History:  Past Surgical History:  Procedure Laterality Date   HERNIA REPAIR  07/13/2011   [facility]   INGUINAL HERNIA REPAIR  07/13/2011   Procedure: HERNIA REPAIR INGUINAL ADULT;  Surgeon: Imogene Burn. Georgette Dover, MD;  Location: [facility];  Service: General;  Laterality: Right;   NO PREVIOUS SURGERY      Allergies:  Allergies  Allergen Reactions   Vancomycin Hives    hives post op on R arm -site of infusion, and whole body itching    Family History:  Family History  Problem Relation Age of Onset   Healthy Mother    Heart disease Father     Social History:  Social History   Tobacco Use   Smoking status: Never   Smokeless tobacco: Current    Types: Chew  Vaping Use   Vaping Use: Never used  Substance Use Topics   Alcohol use: Yes    Comment: Social   Drug use: No    Review of symptoms:  Constitutional:  Negative for unexplained weight loss, night sweats, fever, chills ENT:  Negative for nose bleeds, sinus pain, painful swallowing CV:  Negative for chest pain, shortness of breath, exercise intolerance, palpitations, loss of consciousness Resp:  Negative for cough, wheezing, shortness of breath GI:  Negative for nausea, vomiting, diarrhea, bloody stools GU:  Positives noted in HPI; otherwise negative for gross hematuria, dysuria, urinary incontinence Neuro:  Negative for seizures, poor balance, limb weakness, slurred speech Psych:  Negative for lack of energy, depression, anxiety Endocrine:  Negative for polydipsia, polyuria, symptoms of hypoglycemia  (dizziness, hunger, sweating) Hematologic:  Negative for anemia, purpura, petechia, prolonged or excessive bleeding, use of anticoagulants  Allergic:  Negative for difficulty breathing or choking as a result of exposure to anything; no shellfish allergy; no allergic response (rash/itch) to materials, foods  Physical exam: There were no vitals taken for this visit. GENERAL APPEARANCE:  Well appearing, well developed, well nourished, NAD HEENT: Atraumatic, Normocephalic, oropharynx clear. NECK: Supple without lymphadenopathy or thyromegaly. LUNGS: Clear to auscultation bilaterally. HEART: Regular Rate and Rhythm without murmurs, gallops, or rubs. ABDOMEN: Soft, non-tender, No Masses. EXTREMITIES: Moves all extremities well.  Without clubbing, cyanosis, or edema. NEUROLOGIC:  Alert and oriented x 3, normal gait, CN II-XII grossly intact.  MENTAL STATUS:  Appropriate. BACK:  Non-tender to palpation.  No CVAT SKIN:  Warm, dry and intact.    Results: No results found for this or any previous visit (from the past 24 hour(s)).

## 2021-04-24 ENCOUNTER — Other Ambulatory Visit: Payer: Self-pay | Admitting: Cardiology

## 2021-05-11 ENCOUNTER — Other Ambulatory Visit: Payer: Self-pay | Admitting: Student

## 2021-06-16 ENCOUNTER — Ambulatory Visit (INDEPENDENT_AMBULATORY_CARE_PROVIDER_SITE_OTHER): Payer: BC Managed Care – PPO | Admitting: Family Medicine

## 2021-06-16 VITALS — BP 113/77 | HR 72 | Ht 73.5 in | Wt 262.2 lb

## 2021-06-16 DIAGNOSIS — I5022 Chronic systolic (congestive) heart failure: Secondary | ICD-10-CM | POA: Diagnosis not present

## 2021-06-16 DIAGNOSIS — I10 Essential (primary) hypertension: Secondary | ICD-10-CM

## 2021-06-16 DIAGNOSIS — E291 Testicular hypofunction: Secondary | ICD-10-CM

## 2021-06-16 DIAGNOSIS — E785 Hyperlipidemia, unspecified: Secondary | ICD-10-CM

## 2021-06-16 NOTE — Assessment & Plan Note (Signed)
Stable. Continue Crestor. 

## 2021-06-16 NOTE — Assessment & Plan Note (Signed)
Placing referral to [facility]. ?

## 2021-06-16 NOTE — Assessment & Plan Note (Signed)
BP is stable currently.  Continue current medications. ?

## 2021-06-16 NOTE — Assessment & Plan Note (Signed)
Follows closely with cardiology.  Needs echo. ?

## 2021-06-16 NOTE — Progress Notes (Signed)
? ?Subjective:  ?Patient ID: Ryan Gibson, male    DOB: June 09, 1975  Age: 46 y.o. MRN: [id number] ? ?CC: ?Chief Complaint  ?Patient presents with  ? Hypogonadism  ?  Follow up- patient is currently not on testosterone- has not made appt with Urology ?Patient reports he has no energy- taking OTC Vit D  ? ? ?HPI: ? ?46 year old male with congestive heart failure, history of pulmonary embolism, hypogonadism, hypertension presents for follow-up. ? ?Hypertension is well controlled.  He is currently on Lasix as well as Entresto and spironolactone. ? ?Patient states that he is due for an echocardiogram.  He states that this is scheduled in the next 1 to 2 months. ? ?Patient is currently off testosterone therapy.  He missed his urology appointment.  We had a lot of difficulty getting this approved.  This is also quite risky in the setting of his current medical issues.  He reports ongoing fatigue and decreased libido. ? ?Patient Active Problem List  ? Diagnosis Date Noted  ? Hyperlipidemia 06/16/2021  ? Dilated cardiomyopathy (HCC) 12/16/2020  ? Systolic CHF (HCC) 12/16/2020  ? Family history of early CAD 12/16/2020  ? Pulmonary embolism (HCC) 02/16/2020  ? Depression, major, single episode, moderate (HCC) 03/07/2018  ? Hypogonadism male 11/24/2015  ? Gout 11/01/2014  ? Essential hypertension 03/10/2014  ? ? ?Social Hx   ?Social History  ? ?Socioeconomic History  ? Marital status: Married  ?  Spouse name: Not on file  ? Number of children: 4  ? Years of education: Not on file  ? Highest education level: Not on file  ?Occupational History  ? Not on file  ?Tobacco Use  ? Smoking status: Never  ? Smokeless tobacco: Current  ?  Types: Chew  ?Vaping Use  ? Vaping Use: Never used  ?Substance and Sexual Activity  ? Alcohol use: Yes  ?  Comment: Social  ? Drug use: No  ? Sexual activity: Yes  ?  Birth control/protection: None  ?  Comment: Married  ?Other Topics Concern  ? Not on file  ?Social History Narrative  ? Married for  13 years.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  ? ?Social Determinants of Health  ? ?Financial Resource Strain: Not on file  ?Food Insecurity: Not on file  ?Transportation Needs: Not on file  ?Physical Activity: Not on file  ?Stress: Not on file  ?Social Connections: Not on file  ? ? ?Review of Systems ?Per HPI ? ?Objective:  ?BP 113/77   Pulse 72   Ht 6' 1.5" (1.867 m)   Wt 262 lb 3.2 oz (118.9 kg)   BMI 34.12 kg/m?  ? ? ?  06/16/2021  ?  9:49 AM 03/23/2021  ?  9:26 AM 03/23/2021  ?  9:21 AM  ?BP/Weight  ?Systolic BP 113 155 167  ?Diastolic BP 77 112 116  ?Wt. (Lbs) 262.2  265  ?BMI 34.12 kg/m2  34.49 kg/m2  ? ? ?Physical Exam ?Constitutional:   ?   General: He is not in acute distress. ?   Appearance: Normal appearance.  ?HENT:  ?   Head: Normocephalic and atraumatic.  ?Eyes:  ?   General:     ?   Right eye: No discharge.     ?   Left eye: No discharge.  ?   Conjunctiva/sclera: Conjunctivae normal.  ?Cardiovascular:  ?   Rate and Rhythm: Normal rate and regular rhythm.  ?Pulmonary:  ?   Effort: Pulmonary effort is normal.  ?  Breath sounds: Normal breath sounds. No wheezing or rales.  ?Neurological:  ?   Mental Status: He is alert.  ? ? ?Lab Results  ?Component Value Date  ? WBC 8.6 12/30/2020  ? HGB 14.8 12/30/2020  ? HCT 41.6 12/30/2020  ? PLT 298 12/30/2020  ? GLUCOSE 84 12/30/2020  ? CHOL 160 07/23/2020  ? TRIG 190 (H) 07/23/2020  ? HDL 50 07/23/2020  ? LDLDIRECT 82 07/23/2020  ? LDLCALC 78 07/23/2020  ? ALT 24 12/30/2020  ? AST 24 12/30/2020  ? NA 143 12/30/2020  ? K 4.0 12/30/2020  ? CL 102 12/30/2020  ? CREATININE 0.84 12/30/2020  ? BUN 10 12/30/2020  ? CO2 24 12/30/2020  ? TSH 2.520 07/23/2020  ? INR 1.4 (H) 02/16/2020  ? ? ? ?Assessment & Plan:  ? ?Problem List Items Addressed This Visit   ? ?  ? Cardiovascular and Mediastinum  ? Essential hypertension  ?  BP is stable currently.  Continue current medications. ? ?  ?  ? Systolic CHF (HCC)  ?  Follows closely with  cardiology.  Needs echo. ? ?  ?  ?  ? Endocrine  ? Hypogonadism male - Primary  ?  Placing referral to [facility]. ? ?  ?  ? Relevant Orders  ? Ambulatory referral to Urology  ?  ? Other  ? Hyperlipidemia  ?  Stable.  Continue Crestor. ? ?  ?  ? ?Everlene Other DO ?[facility] ? ?

## 2021-06-16 NOTE — Patient Instructions (Signed)
Continue your current medications. ? ?I have placed another referral for urology in [place]. ? ?Be sure to get your echocardiogram and follow-up with cardiology closely. ? ?Follow up annually. ?

## 2021-06-29 ENCOUNTER — Other Ambulatory Visit: Payer: Self-pay

## 2021-07-05 ENCOUNTER — Telehealth: Payer: Self-pay | Admitting: Student

## 2021-07-05 NOTE — Telephone Encounter (Signed)
Patient came in today for samples of entresto. He asked if he could have a refill for rosuvastatin, and then would like for someone to check on his patient assistance for entresto. [employer] says they haven't received all necessary paperwork and he's trying to get it sorted. Please call him back to let him know if there is anything else he needs to do, and/or to let him know it's been re-faxed.  ?

## 2021-07-06 DIAGNOSIS — R7989 Other specified abnormal findings of blood chemistry: Secondary | ICD-10-CM | POA: Diagnosis not present

## 2021-07-06 DIAGNOSIS — M2550 Pain in unspecified joint: Secondary | ICD-10-CM | POA: Diagnosis not present

## 2021-07-06 DIAGNOSIS — M1A09X0 Idiopathic chronic gout, multiple sites, without tophus (tophi): Secondary | ICD-10-CM | POA: Diagnosis not present

## 2021-07-07 ENCOUNTER — Other Ambulatory Visit: Payer: Self-pay

## 2021-07-07 DIAGNOSIS — R948 Abnormal results of function studies of other organs and systems: Secondary | ICD-10-CM | POA: Diagnosis not present

## 2021-07-07 DIAGNOSIS — R6882 Decreased libido: Secondary | ICD-10-CM | POA: Diagnosis not present

## 2021-07-07 DIAGNOSIS — N5201 Erectile dysfunction due to arterial insufficiency: Secondary | ICD-10-CM | POA: Diagnosis not present

## 2021-07-07 DIAGNOSIS — E349 Endocrine disorder, unspecified: Secondary | ICD-10-CM | POA: Diagnosis not present

## 2021-07-07 MED ORDER — ROSUVASTATIN CALCIUM 20 MG PO TABS: ORAL_TABLET | ORAL | 0 refills | Status: DC

## 2021-07-07 NOTE — Telephone Encounter (Signed)
Called and spoke to pt, refill has been sent. Entresto samples and patient assistance packet has been placed upfront for pt to pick up tomorrow 07/08/2021.

## 2021-07-13 ENCOUNTER — Ambulatory Visit: Payer: BC Managed Care – PPO

## 2021-07-13 ENCOUNTER — Other Ambulatory Visit: Payer: Self-pay

## 2021-07-13 DIAGNOSIS — I5022 Chronic systolic (congestive) heart failure: Secondary | ICD-10-CM | POA: Diagnosis not present

## 2021-07-13 MED ORDER — ENTRESTO 49-51 MG PO TABS: 1.0000 | ORAL_TABLET | Freq: Two times a day (BID) | ORAL | 1 refills | Status: DC

## 2021-07-28 ENCOUNTER — Ambulatory Visit: Payer: BC Managed Care – PPO | Admitting: Student

## 2021-08-02 NOTE — Progress Notes (Unsigned)
Primary Physician/Referring:  Tommie Sams, DO  Patient ID: Ryan Gibson, male    DOB: [birth year], 46 y.o.   MRN: [id number]  No chief complaint on file.  HPI:    Ryan Gibson  is a 46 y.o. Caucasian male patient with hypertension, tobacco use disorder, alcohol abuse, strong family history of premature coronary disease in his father who had coronary artery disease in his late 26s.   Patient presented to [facility] in 01/2020 where he was diagnosed with acute bilateral pulmonary emboli, DVT of the right lower extremity, and severe dilated cardiomyopathy with LVEF <20% as well as acute cholecystitis.  Suspected DVT and PE are related to underlying COVID-19 infection.  Cardiomyopathy likely related to alcohol abuse.  Patient was initiated on guideline directed medical therapy including Jardiance, metoprolol, and Entresto.  Since then his admission in 01/2020 patient has reduced alcohol intake and recent repeat echocardiogram in 06/2021 revealed LVEF improved to 40-45%.  Patient was last seen in the office 03/23/2021 at which time added spironolactone 12.5 mg, repeat BMP has unfortunately not been done as ordered. ***  ***echo results   Since since the admission in 01/2020 patient has reduced his alcohol intake, now drinking approximately 1 beer per day, unfortunately he does continue to chew tobacco daily.  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  Repeat echocardiogram in 11/2020 showed LVEF improved to 35-40% with grade 1 diastolic dysfunction and only mild MR.  Patient now presents for 52-month follow-up.  At last office visit heart rate was >70 bpm, therefore Toprol-XL was increased 250 mg daily which he is tolerating without issue.  Patient does admit to dietary noncompliance stating he has been eating [employer] every day for breakfast.   Denies chest pain, orthopnea, dyspnea, leg edema.  Past Medical History:  Diagnosis Date   Asthma    AS A CHILD  ONLY   Foot pain, right    painful x 1 month and swelling   Past Surgical History:  Procedure Laterality Date   HERNIA REPAIR  07/13/2011   [facility]   INGUINAL HERNIA REPAIR  07/13/2011   Procedure: HERNIA REPAIR INGUINAL ADULT;  Surgeon: Wilmon Arms. Corliss Skains, MD;  Location: [facility];  Service: General;  Laterality: Right;   NO PREVIOUS SURGERY     Family History  Problem Relation Age of Onset   Healthy Mother    Heart disease Father     Social History   Tobacco Use   Smoking status: Never   Smokeless tobacco: Current    Types: Chew  Substance Use Topics   Alcohol use: Yes    Comment: Social   Marital Status: Married  ROS  Review of Systems  Cardiovascular:  Negative for chest pain, dyspnea on exertion, leg swelling, orthopnea and paroxysmal nocturnal dyspnea.  Gastrointestinal:  Negative for melena.  Neurological:  Negative for dizziness.    Objective  There were no vitals taken for this visit. There is no height or weight on file to calculate BMI.      06/16/2021    9:49 AM 03/23/2021    9:26 AM 03/23/2021    9:21 AM  Vitals with BMI  Height 6' 1.5"  6' 1.5"  Weight 262 lbs 3 oz  265 lbs  BMI 34.12  34.48  Systolic 113 155 563  Diastolic 77 112 116  Pulse 72 88 84     Physical Exam Vitals reviewed.  Constitutional:  Appearance: He is obese.  Neck:     Vascular: No carotid bruit or JVD.  Cardiovascular:     Rate and Rhythm: Normal rate and regular rhythm.     Pulses: Normal pulses and intact distal pulses.     Heart sounds: No murmur heard.    No gallop.     Comments: Varicose veins bilateral lower extremities, left worse Pulmonary:     Effort: Pulmonary effort is normal.     Breath sounds: Normal breath sounds.  Musculoskeletal:     Right lower leg: No edema.     Left lower leg: No edema.     Laboratory examination:   Recent Labs    12/30/20 1639  NA 143  K 4.0  CL 102  CO2 24  GLUCOSE 84  BUN 10  CREATININE 0.84  CALCIUM 8.9    CrCl cannot  be calculated (Patient's most recent lab result is older than the maximum 21 days allowed.).     Latest Ref Rng & Units 12/30/2020    4:39 PM 07/23/2020   10:15 AM 02/22/2020    5:00 AM  CMP  Glucose 70 - 99 mg/dL 84  86  80   BUN 6 - 24 mg/dL 10  12  8    Creatinine 0.76 - 1.27 mg/dL 2.95  2.84  1.32   Sodium 134 - 144 mmol/L 143  143  134   Potassium 3.5 - 5.2 mmol/L 4.0  4.5  3.7   Chloride 96 - 106 mmol/L 102  102  91   CO2 20 - 29 mmol/L 24  26  34   Calcium 8.7 - 10.2 mg/dL 8.9  9.3  8.4   Total Protein 6.0 - 8.5 g/dL 7.1  7.0  5.6   Total Bilirubin 0.0 - 1.2 mg/dL 0.7  0.9  1.9   Alkaline Phos 44 - 121 IU/L 46  46  50   AST 0 - 40 IU/L 24  31  330   ALT 0 - 44 IU/L 24  26  833       Latest Ref Rng & Units 12/30/2020    4:39 PM 02/22/2020    5:00 AM 02/21/2020    6:45 AM  CBC  WBC 3.4 - 10.8 x10E3/uL 8.6  5.9  6.8   Hemoglobin 13.0 - 17.7 g/dL 44.0  10.2  72.5   Hematocrit 37.5 - 51.0 % 41.6  46.8  47.2   Platelets 150 - 450 x10E3/uL 298  193  200    Lipid Panel     Component Value Date/Time   CHOL 160 07/23/2020 1015   TRIG 190 (H) 07/23/2020 1015   HDL 50 07/23/2020 1015   CHOLHDL 5.6 (H) 11/20/2019 1528   LDLCALC 78 07/23/2020 1015   LDLDIRECT 82 07/23/2020 1015   LABVLDL 32 07/23/2020 1015    HEMOGLOBIN A1C No results found for: "HGBA1C", "MPG" TSH No results for input(s): "TSH" in the last 8760 hours.  Allergies   Allergies  Allergen Reactions   Vancomycin Hives    hives post op on R arm -site of infusion, and whole body itching    Medication prior to this encounter:   Outpatient Medications Prior to Visit  Medication Sig Dispense Refill   apixaban (ELIQUIS) 5 MG TABS tablet Take 1 tablet (5 mg total) by mouth 2 (two) times daily. Only start this script after taking eliquis 10mg  BID x 6 days 180 tablet 1   colchicine 0.6 MG tablet Take 0.6 mg  by mouth daily.     furosemide (LASIX) 20 MG tablet TAKE 1 TABLET (20 MG TOTAL) BY MOUTH DAILY AS NEEDED FOR  FLUID OR EDEMA. 90 tablet 1   JARDIANCE 10 MG TABS tablet Take 1 tablet (10 mg total) by mouth daily. 90 tablet 0   metoprolol succinate (TOPROL-XL) 100 MG 24 hr tablet TAKE 1 AND 1/2 TABLETS(150 MG) BY MOUTH DAILY 135 tablet 1   ondansetron (ZOFRAN) 4 MG tablet Take 1 tablet (4 mg total) by mouth every 8 (eight) hours as needed for nausea or vomiting. 20 tablet 0   rosuvastatin (CRESTOR) 20 MG tablet TAKE 1 TABLET(20 MG) BY MOUTH DAILY 90 tablet 0   sacubitril-valsartan (ENTRESTO) 49-51 MG Take 1 tablet by mouth 2 (two) times daily. 180 tablet 1   sertraline (ZOLOFT) 50 MG tablet TAKE 1 TABLET(50 MG) BY MOUTH DAILY 90 tablet 1   spironolactone (ALDACTONE) 25 MG tablet Take 0.5 tablets (12.5 mg total) by mouth daily. 45 tablet 3   Testosterone Enanthate 200 MG/ML SOLN Inject 200 mg as directed once a week. (Patient not taking: Reported on 06/16/2021) 5 mL 1   VERQUVO 10 MG TABS Take 1 tablet by mouth daily.     No facility-administered medications prior to visit.    FINAL MEDICATION AS OF TODAY:   No outpatient medications have been marked as taking for the 08/04/21 encounter (Appointment) with Carlynn Purl, Julene Rahn C, PA-C.    Radiology:   CT angiogram 02/16/2020: Bilateral pulmonary emboli worst in the right lower lobe. No findings to suggest right heart strain are noted.   Bilateral patchy infiltrates likely related to the underlying emboli and may represent early sequelae of pulmonary infarct.   Critical Value/emergent results were called by telephone at the time of interpretation on 02/16/2020 at 10:49 am to Dr. Willy Eddy , who verbally acknowledged these results.  Cardiac Studies:   PCV MYOCARDIAL PERFUSION WITH LEXISCAN 12/09/2020 Lexiscan/modified Bruce nuclear stress test performed using 1-day protocol. Stress EKG is non-diagnostic, as this is pharmacological stress test. In addition, rest and stress EKG showed sinus rhythm, frequent PVC's, nonspecific ST-T changes. SPECT  images show small sized, medium intensity, fixed perfusion defect in basal inferior myocardium. Dilated left ventricular cavity with severe global myocardial thickening and wall motion. Stress LVEF 23%. Findings most likely represent dilated cardiomyopathy. High risk study,  Echocardiogram 07/13/2021:  Mildly depressed LV systolic function with visual EF 40-45%. Left  ventricle cavity is dilated. Normal left ventricular wall thickness.  Hypokinetic global wall motion. Doppler evidence of grade I (impaired)  diastolic dysfunction.  Mild (Grade I) mitral regurgitation.  Compared to 12/09/2020 LVEF improved from 35-40% to 40-45% otherwise no significant change.    EKG:  03/23/2021: Sinus rhythm rate of 70 bpm.  Normal axis.  Incomplete right bundle branch block.  Borderline LVH criteria.  EKG 07/16/2020: Normal sinus rhythm with rate of 69 bpm, left atrial enlargement, left axis deviation, left anterior fascicular block.  Incomplete right bundle branch block.  LVH.      Assessment   No diagnosis found.   There are no discontinued medications.   No orders of the defined types were placed in this encounter.  No orders of the defined types were placed in this encounter.  Recommendations:   Ryan Gibson is a 46 y.o. Caucasian male patient with hypertension, tobacco use disorder, alcohol abuse, strong family history of premature coronary disease in his father who had coronary artery disease in his late 57s.  Patient presented to [facility] in 01/2020 where he was diagnosed with acute bilateral pulmonary emboli, DVT of the right lower extremity, and severe dilated cardiomyopathy with LVEF <20% as well as acute cholecystitis.  Suspected DVT and PE are related to underlying COVID-19 infection.  Cardiomyopathy likely related to alcohol abuse.  Patient was initiated on guideline directed medical therapy including Jardiance, metoprolol, and Entresto.  Since then his admission in 01/2020 patient has  reduced alcohol intake and recent repeat echocardiogram in 06/2021 revealed LVEF improved to 40-45%.  Patient was last seen in the office 03/23/2021 at which time added spironolactone 12.5 mg, repeat BMP has unfortunately not been done as ordered. ***  ***echo result  Patient is congratulated on his reduced alcohol intake and encouraged to continue to work on tobacco cessation.  Spent 4 minutes of today's office visit counseling regarding tobacco cessation.  Patient is clinically euvolemic without evidence of acute heart failure at today's office visit.  However he does report dietary noncompliance.  Reiterated the importance of DASH diet as well as medication and dietary compliance.  Also encourage patient to increase physical activity as he is sedentary.  Patient's blood pressure is uncontrolled, likely due to high sodium intake.  Given history of cardiomyopathy will add spironolactone 12.5 mg daily with repeat BMP in 1 week both for hypertension and heart failure.  I have also prescribed furosemide to be taken as needed, patient verbalized understanding of as needed dosing of furosemide.  Patient's heart rate has improved and is now better controlled with up titration of beta-blocker therapy at last office visit.  We will plan to repeat echocardiogram in 3 months prior to next office visit to reevaluate LVEF with up titration of guideline directed medical therapy.  Follow-up in 4 to 5 months, sooner if needed.   Rayford Halsted, PA-C 08/02/2021, 2:34 PM Office: [phone]

## 2021-08-04 ENCOUNTER — Other Ambulatory Visit: Payer: Self-pay

## 2021-08-04 ENCOUNTER — Ambulatory Visit: Payer: BC Managed Care – PPO | Admitting: Student

## 2021-08-04 ENCOUNTER — Encounter: Payer: Self-pay | Admitting: Student

## 2021-08-04 VITALS — BP 138/89 | HR 79 | Temp 98.6°F | Resp 17 | Ht 73.5 in | Wt 269.0 lb

## 2021-08-04 DIAGNOSIS — I2699 Other pulmonary embolism without acute cor pulmonale: Secondary | ICD-10-CM | POA: Diagnosis not present

## 2021-08-04 DIAGNOSIS — I10 Essential (primary) hypertension: Secondary | ICD-10-CM

## 2021-08-04 DIAGNOSIS — I5022 Chronic systolic (congestive) heart failure: Secondary | ICD-10-CM

## 2021-08-04 MED ORDER — ENTRESTO 49-51 MG PO TABS: 1.0000 | ORAL_TABLET | Freq: Two times a day (BID) | ORAL | 1 refills | Status: DC

## 2021-08-04 MED ORDER — FUROSEMIDE 40 MG PO TABS
40.0000 mg | ORAL_TABLET | Freq: Every day | ORAL | 3 refills | Status: DC | PRN
Start: 2021-08-04 — End: 2021-11-17

## 2021-08-04 MED ORDER — SPIRONOLACTONE 25 MG PO TABS: 25.0000 mg | ORAL_TABLET | Freq: Every day | ORAL | 3 refills | Status: DC

## 2021-08-04 MED ORDER — APIXABAN 5 MG PO TABS: 5.0000 mg | ORAL_TABLET | Freq: Two times a day (BID) | ORAL | 3 refills | Status: DC

## 2021-08-04 MED ORDER — ROSUVASTATIN CALCIUM 20 MG PO TABS: ORAL_TABLET | ORAL | 3 refills | Status: DC

## 2021-08-04 MED ORDER — JARDIANCE 10 MG PO TABS: 10.0000 mg | ORAL_TABLET | Freq: Every day | ORAL | 3 refills | Status: DC

## 2021-08-09 ENCOUNTER — Other Ambulatory Visit: Payer: Self-pay

## 2021-08-09 DIAGNOSIS — I5022 Chronic systolic (congestive) heart failure: Secondary | ICD-10-CM

## 2021-08-09 MED ORDER — ENTRESTO 49-51 MG PO TABS: 1.0000 | ORAL_TABLET | Freq: Two times a day (BID) | ORAL | 1 refills | Status: DC

## 2021-09-02 IMAGING — US US ABDOMEN LIMITED
1 series · 13 of 25 positions shown · non-contrast
Comparison: Ultrasound right upper quadrant February 12, 2020

CLINICAL DATA: Upper abdominal pain

EXAM:
ULTRASOUND ABDOMEN LIMITED RIGHT UPPER QUADRANT

[Series 1: us abdomen limited ruq (liver/gb) · 13 of 36 slices shown]
[im 1/36]
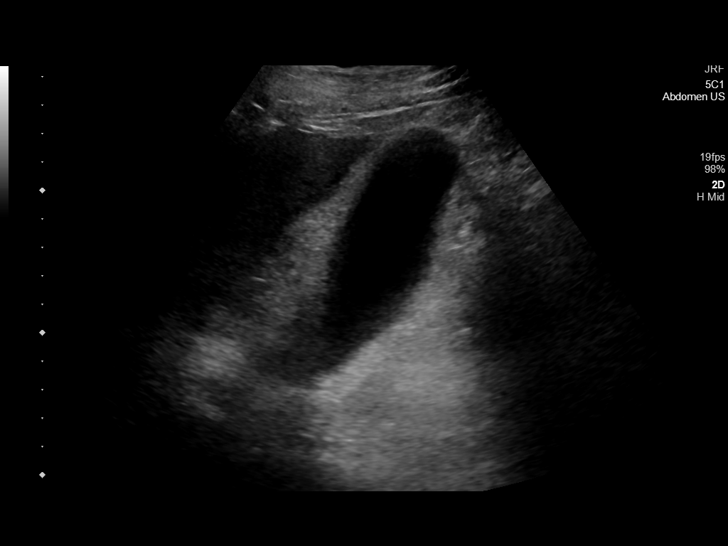
[im 3/36]
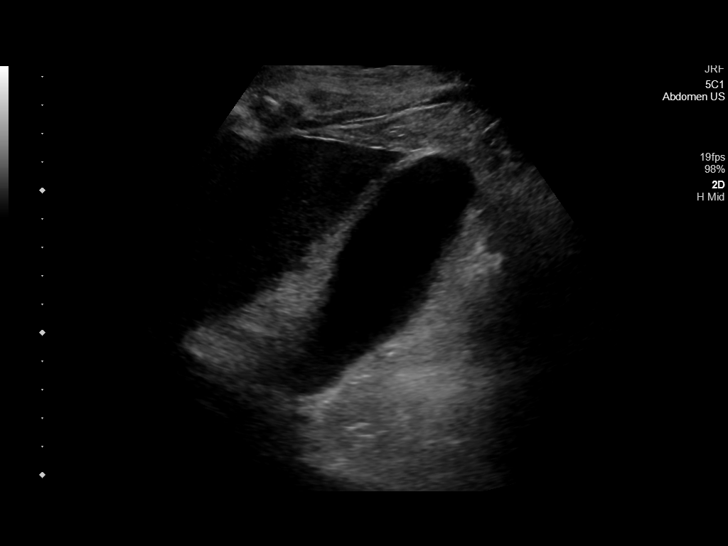
[im 6/36]
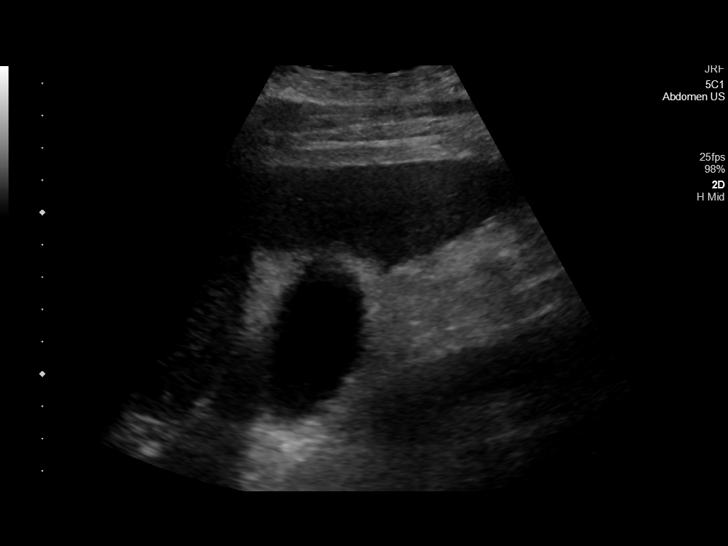
[im 9/36]
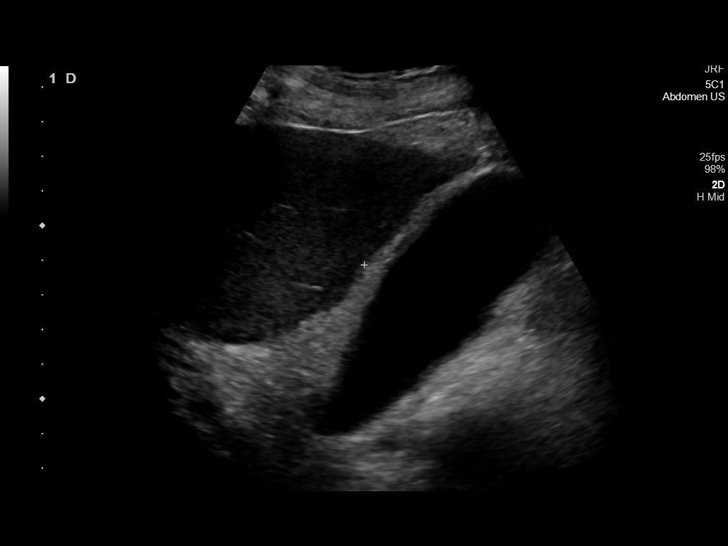
[im 12/36]
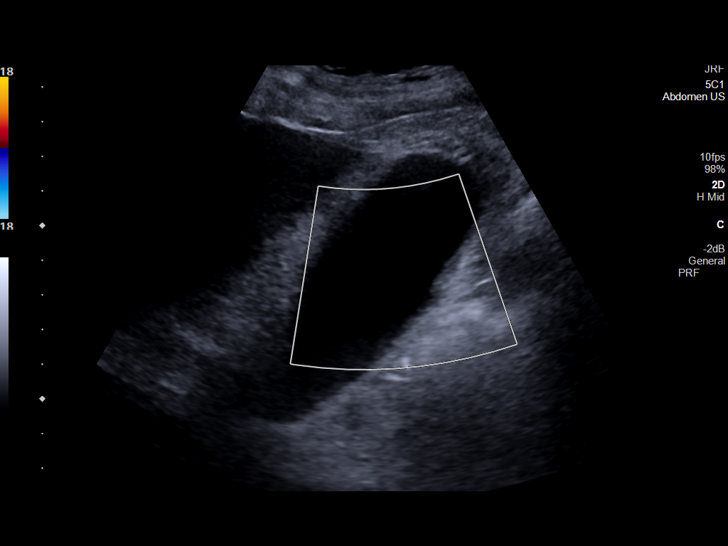
[im 15/36]
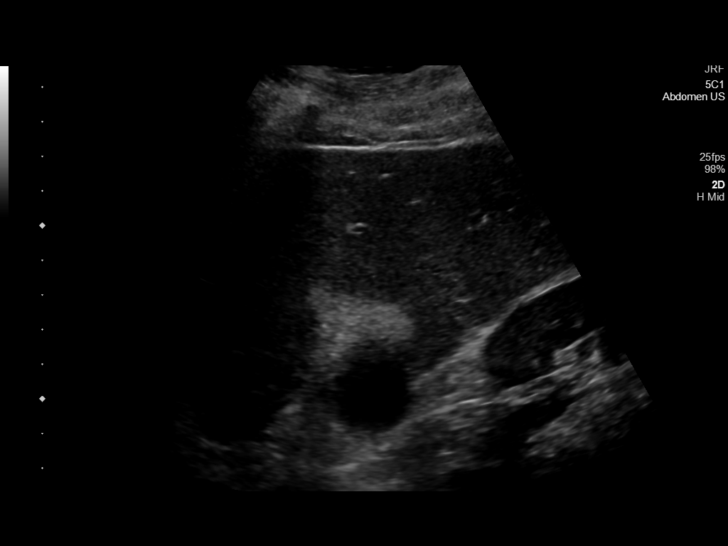
[im 18/36]
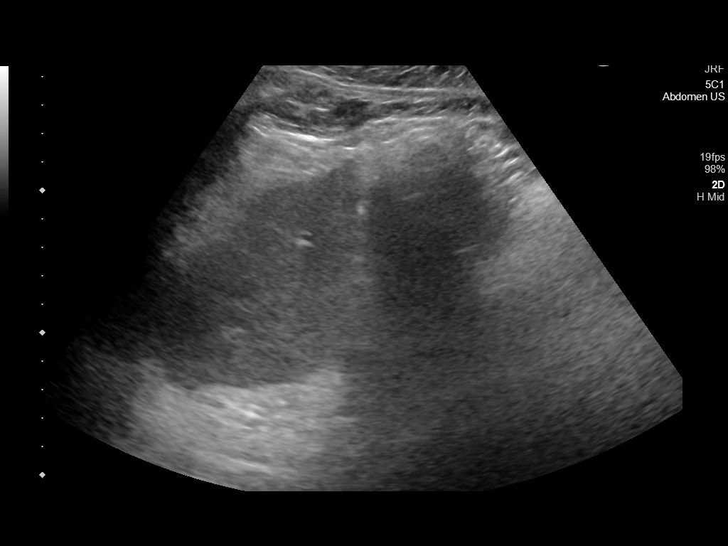
[im 21/36]
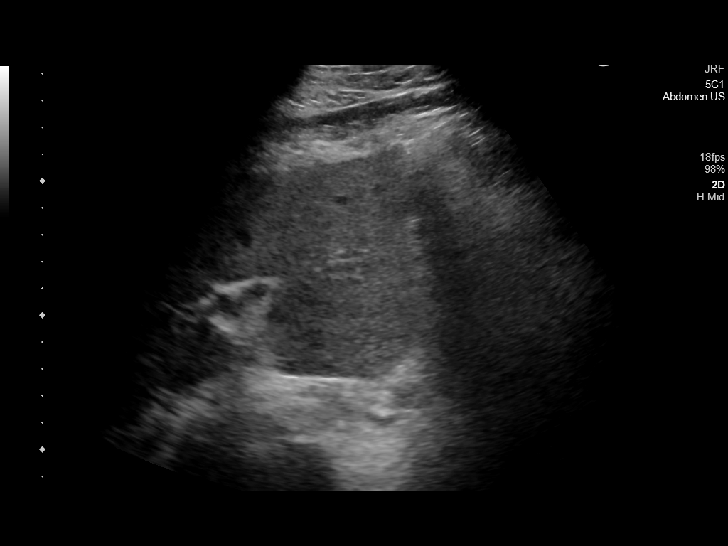
[im 24/36]
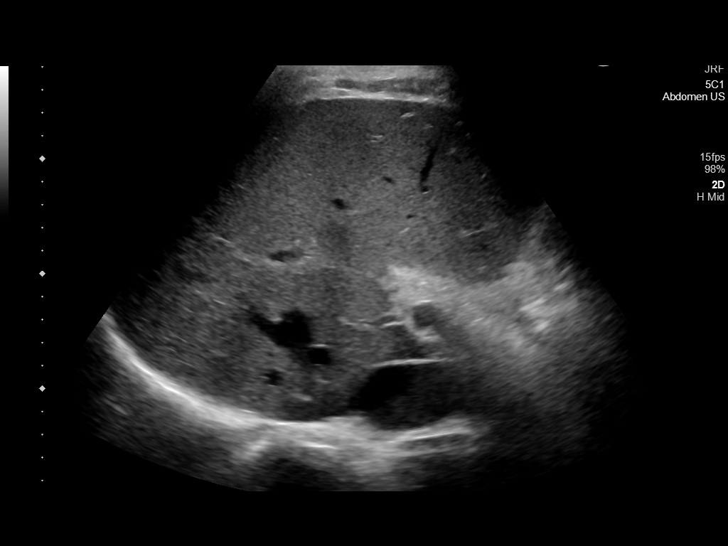
[im 27/36]
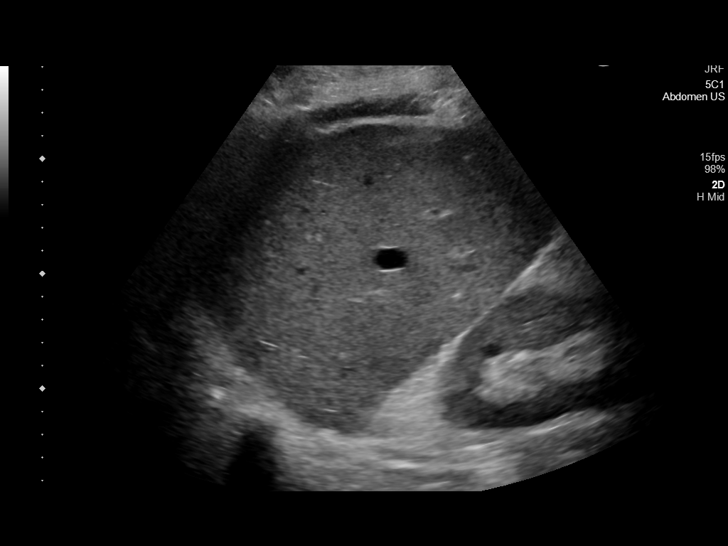
[im 30/36]
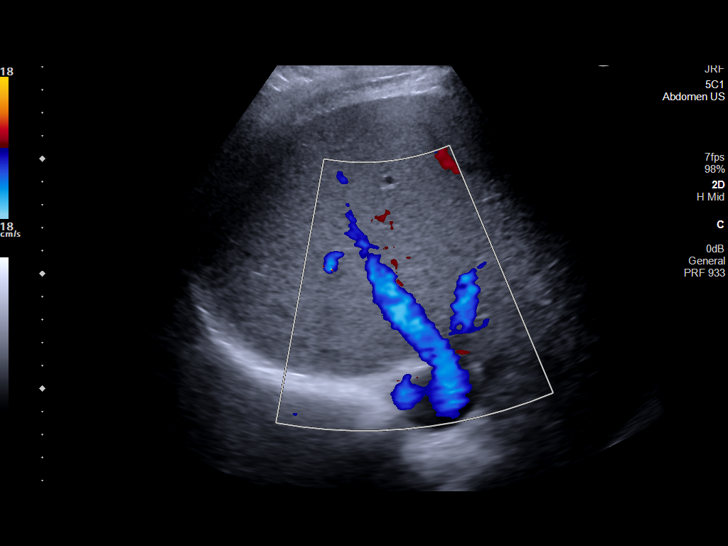
[im 33/36]
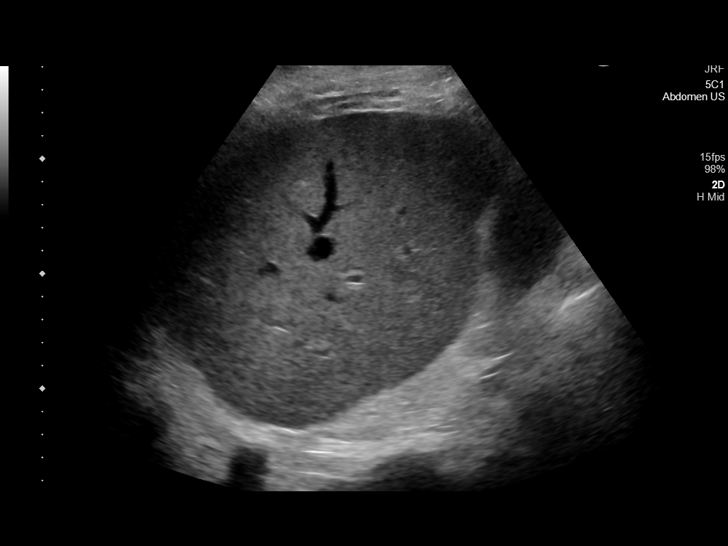
[im 36/36]
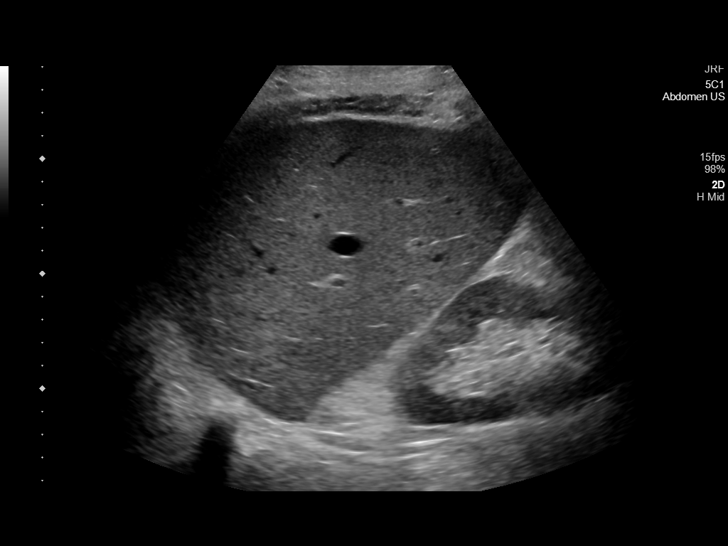

[13 of 25 positions shown; findings below may reference images not displayed]

FINDINGS: Gallbladder:

There is sludge in the gallbladder. No gallstones are appreciable.
The gallbladder wall remains thickened, and patient is focally
tender over the gallbladder. There is equivocal pericholecystic
fluid. No sonographic Murphy sign noted by sonographer.

Common bile duct:

Diameter: 3 mm. No intrahepatic or extrahepatic biliary duct
dilatation.

Liver:

No focal lesion identified. Within normal limits in parenchymal
echogenicity. Portal vein is patent on color Doppler imaging with
normal direction of blood flow towards the liver.

Other: None.
IMPRESSION: Gallbladder wall remains thickened with equivocal pericholecystic
fluid. Patient is focally tender over the gallbladder. There is
sludge in the gallbladder without demonstrable gallstones. This
appearance is concerning for a degree of acalculus cholecystitis. It
may be prudent to consider nuclear medicine hepatobiliary imaging
study to assess for cystic duct patency.

Study otherwise unremarkable.

These results will be called to the ordering clinician or
representative by the Radiologist Assistant, and communication
documented in the PACS or [REDACTED].

## 2021-09-02 IMAGING — NM NM HEPATOBILIARY IMAGE, INC GB
1 series · 6 of 6 positions shown · non-contrast
Comparison: CT abdomen pelvis from 02/11/2020, ultrasound abdomen
from 02/12/2020 and 02/19/2020

CLINICAL DATA: 44-year-old male with history of 2 months of
abdominal pain, worse in the right upper quadrant over the past 2
days with associated nausea and vomiting. Currently hospitalized for
bilateral pulmonary emboli.

EXAM:
NUCLEAR MEDICINE HEPATOBILIARY IMAGING
TECHNIQUE: Sequential images of the abdomen were obtained [DATE] minutes
following intravenous administration of radiopharmaceutical.
RADIOPHARMACEUTICALS:  5.13 mCi 1c-55m  Choletec IV

[Series 1000: hepatobiliary scan · 9.59mm/px · 6 of 60 frames shown]
[frame 6/60]
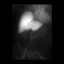
[frame 16/60]
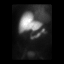
[frame 26/60]
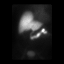
[frame 36/60]
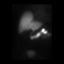
[frame 46/60]
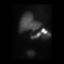
[frame 56/60]
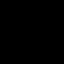

[6 of 6 positions shown; findings below may reference images not displayed]

FINDINGS: Prompt uptake and biliary excretion of activity by the liver is
seen. Gallbladder activity is not visualized after 60 minutes,
however after administration of 3 mg morphine, radiotracer uptake
within the gallbladder is immediately visualized, consistent with
patency of cystic duct. Biliary activity passes into small bowel,
consistent with patent common bile duct.
IMPRESSION: Patent cystic duct, no evidence of acute cholecystitis.

## 2021-09-07 ENCOUNTER — Other Ambulatory Visit: Payer: Self-pay

## 2021-09-07 DIAGNOSIS — I5022 Chronic systolic (congestive) heart failure: Secondary | ICD-10-CM

## 2021-09-11 ENCOUNTER — Ambulatory Visit
Admission: RE | Admit: 2021-09-11 | Discharge: 2021-09-11 | Disposition: A | Discharge status: Home / Self Care | Payer: BC Managed Care – PPO | Source: Ambulatory Visit | Attending: Nurse Practitioner | Admitting: Nurse Practitioner

## 2021-09-11 VITALS — BP 152/94 | HR 123 | Temp 98.9°F | Resp 18

## 2021-09-11 DIAGNOSIS — J039 Acute tonsillitis, unspecified: Secondary | ICD-10-CM | POA: Insufficient documentation

## 2021-09-11 LAB — POCT RAPID STREP A (OFFICE): Rapid Strep A Screen: NEGATIVE

## 2021-09-11 MED ORDER — PREDNISONE 20 MG PO TABS: 20.0000 mg | ORAL_TABLET | Freq: Every day | ORAL | 0 refills | Status: AC

## 2021-09-11 MED ORDER — AMOXICILLIN-POT CLAVULANATE 875-125 MG PO TABS: 1.0000 | ORAL_TABLET | Freq: Two times a day (BID) | ORAL | 0 refills | Status: DC

## 2021-09-11 NOTE — ED Triage Notes (Signed)
Sore throat, chills x 2 days and left knee pain x 2 days.  No known injury.

## 2021-09-11 NOTE — ED Provider Notes (Signed)
[facility]    CSN: [id number] Arrival date & time: 09/11/21  1143      History   Chief Complaint Chief Complaint  Patient presents with   Chills    Tonsils are swollen and hurt. Hurts to swallow and I have the chills - Entered by patient    HPI Ryan Gibson is a 46 y.o. male.   The history is provided by the patient.   Patient presents for complaints of body aches, sore throat, chills, and body aches have been present for the past 2 days.  Patient states that he also has had nausea and vomiting on the first day his symptoms started.  He states is very difficult for him to eat into swallow.  He denies fever, headache, nasal congestion, runny nose, cough, abdominal pain.  He states that he has been taking ibuprofen and Tylenol for his symptoms with no relief.  Denies any known sick contacts.  Past Medical History:  Diagnosis Date   Asthma    AS A CHILD ONLY   Foot pain, right    painful x 1 month and swelling    Patient Active Problem List   Diagnosis Date Noted   Hyperlipidemia 06/16/2021   Dilated cardiomyopathy (HCC) 12/16/2020   Systolic CHF (HCC) 12/16/2020   Family history of early CAD 12/16/2020   Pulmonary embolism (HCC) 02/16/2020   Depression, major, single episode, moderate (HCC) 03/07/2018   Hypogonadism male 11/24/2015   Gout 11/01/2014   Essential hypertension 03/10/2014    Past Surgical History:  Procedure Laterality Date   HERNIA REPAIR  07/13/2011   [facility]   INGUINAL HERNIA REPAIR  07/13/2011   Procedure: HERNIA REPAIR INGUINAL ADULT;  Surgeon: Wilmon Arms. Corliss Skains, MD;  Location: [facility];  Service: General;  Laterality: Right;   NO PREVIOUS SURGERY         Home Medications    Prior to Admission medications   Medication Sig Start Date End Date Taking? Authorizing Provider  amoxicillin-clavulanate (AUGMENTIN) 875-125 MG tablet Take 1 tablet by mouth every 12 (twelve) hours. 09/11/21  Yes Juliany Daughety-Warren, Sadie Haber, NP  predniSONE  (DELTASONE) 20 MG tablet Take 1 tablet (20 mg total) by mouth daily with breakfast for 5 days. 09/11/21 09/16/21 Yes Homer Pfeifer-Warren, Sadie Haber, NP  apixaban (ELIQUIS) 5 MG TABS tablet Take 1 tablet (5 mg total) by mouth 2 (two) times daily. Only start this script after taking eliquis 10mg  BID x 6 days 08/04/21 07/30/22  Cantwell, Celeste C, PA-C  colchicine 0.6 MG tablet Take 0.6 mg by mouth daily.    [provider]  febuxostat (ULORIC) 40 MG tablet Take 40 mg by mouth daily. 07/13/21   [provider]  furosemide (LASIX) 40 MG tablet Take 1 tablet (40 mg total) by mouth daily as needed for fluid or edema. 08/04/21 07/30/22  Cantwell, Celeste C, PA-C  JARDIANCE 10 MG TABS tablet Take 1 tablet (10 mg total) by mouth daily. 08/04/21   Cantwell, Celeste C, PA-C  metoprolol succinate (TOPROL-XL) 100 MG 24 hr tablet TAKE 1 AND 1/2 TABLETS(150 MG) BY MOUTH DAILY 03/08/21   03/10/21, MD  ondansetron (ZOFRAN) 4 MG tablet Take 1 tablet (4 mg total) by mouth every 8 (eight) hours as needed for nausea or vomiting. 12/20/20   Wurst, 12/22/20, PA-C  rosuvastatin (CRESTOR) 20 MG tablet TAKE 1 TABLET(20 MG) BY MOUTH DAILY 08/04/21   Cantwell, Celeste C, PA-C  sacubitril-valsartan (ENTRESTO) 49-51 MG Take 1 tablet by mouth 2 (two)  times daily. 08/09/21   Yates Decamp, MD  sertraline (ZOLOFT) 50 MG tablet TAKE 1 TABLET(50 MG) BY MOUTH DAILY 03/09/21   Tommie Sams, DO  spironolactone (ALDACTONE) 25 MG tablet Take 1 tablet (25 mg total) by mouth daily. 08/04/21 07/30/22  Cantwell, Renne Musca, PA-C  Testosterone Enanthate 200 MG/ML SOLN Inject 200 mg as directed once a week. 03/09/21   Cook, Jayce G, DO  VERQUVO 10 MG TABS Take 1 tablet by mouth daily. 06/03/20   [provider]  XYOSTED 75 MG/0.5ML SOAJ Inject into the skin. 07/30/21   [provider]    Family History Family History  Problem Relation Age of Onset   Healthy Mother    Heart disease Father     Social History Social History    Tobacco Use   Smoking status: Never   Smokeless tobacco: Current    Types: Chew  Vaping Use   Vaping Use: Never used  Substance Use Topics   Alcohol use: Yes    Comment: Social   Drug use: No     Allergies   Vancomycin   Review of Systems Review of Systems Per HPI  Physical Exam Triage Vital Signs ED Triage Vitals  Enc Vitals Group     BP 09/11/21 1228 (!) 152/94     Pulse Rate 09/11/21 1228 (!) 123     Resp 09/11/21 1228 18     Temp 09/11/21 1228 98.9 F (37.2 C)     Temp Source 09/11/21 1228 Oral     SpO2 09/11/21 1228 93 %     Weight --      Height --      Head Circumference --      Peak Flow --      Pain Score 09/11/21 1230 10     Pain Loc --      Pain Edu? --      Excl. in GC? --    No data found.  Updated Vital Signs BP (!) 152/94 (BP Location: Right Arm)   Pulse (!) 123   Temp 98.9 F (37.2 C) (Oral)   Resp 18   SpO2 93%   Visual Acuity Right Eye Distance:   Left Eye Distance:   Bilateral Distance:    Right Eye Near:   Left Eye Near:    Bilateral Near:     Physical Exam Vitals and nursing note reviewed.  Constitutional:      Appearance: Normal appearance. He is ill-appearing.  HENT:     Head: Normocephalic.     Right Ear: Tympanic membrane, ear canal and external ear normal.     Left Ear: Tympanic membrane, ear canal and external ear normal.     Nose: Nose normal.     Right Turbinates: Enlarged and swollen.     Left Turbinates: Enlarged and swollen.     Right Sinus: No maxillary sinus tenderness or frontal sinus tenderness.     Left Sinus: No maxillary sinus tenderness or frontal sinus tenderness.     Mouth/Throat:     Lips: Pink.     Mouth: Mucous membranes are moist.     Pharynx: Uvula midline. Uvula swelling present.     Tonsils: No tonsillar exudate. 2+ on the right. 2+ on the left.  Eyes:     Extraocular Movements: Extraocular movements intact.     Conjunctiva/sclera: Conjunctivae normal.     Pupils: Pupils are equal,  round, and reactive to light.  Cardiovascular:     Rate  and Rhythm: Regular rhythm. Tachycardia present.     Pulses: Normal pulses.     Heart sounds: Normal heart sounds.  Pulmonary:     Effort: Pulmonary effort is normal.     Breath sounds: Normal breath sounds.  Abdominal:     General: Bowel sounds are normal. There is no distension.     Palpations: Abdomen is soft.     Tenderness: There is no abdominal tenderness. There is no right CVA tenderness, left CVA tenderness, guarding or rebound.  Musculoskeletal:     Cervical back: Normal range of motion.  Lymphadenopathy:     Cervical: Cervical adenopathy present.  Skin:    General: Skin is warm and dry.  Neurological:     General: No focal deficit present.     Mental Status: He is alert and oriented to person, place, and time.  Psychiatric:        Mood and Affect: Mood normal.        Behavior: Behavior normal.      UC Treatments / Results  Labs (all labs ordered are listed, but only abnormal results are displayed) Labs Reviewed  POCT RAPID STREP A (OFFICE)    EKG   Radiology No results found.  Procedures Procedures (including critical care time)  Medications Ordered in UC Medications - No data to display  Initial Impression / Assessment and Plan / UC Course  I have reviewed the triage vital signs and the nursing notes.  Pertinent labs & imaging results that were available during my care of the patient were reviewed by me and considered in my medical decision making (see chart for details).  Patient presents for complaints of sore throat, fatigue, body aches, and chills that been present for the past 2 days.  On exam, patient has cervical adenopathy, he is tachycardic, and is currently diaphoretic during his exam.  His rapid strep test is negative, culture is pending.  Patient declined COVID/flu test.  Tonsils are +2 bilaterally with inflammation, no exudate is present.  We will treat patient for acute tonsillitis  with Augmentin for 7 days and prednisone for 5 days.  Patient does have a history of diabetes patient informed to monitor his blood glucose levels while he is taking the prednisone.  Supportive care recommendations were provided to the patient.  Patient advised to follow-up with his primary care physician if symptoms do not improve. Final Clinical Impressions(s) / UC Diagnoses   Final diagnoses:  Acute tonsillitis, unspecified etiology     Discharge Instructions      Your rapid strep test was negative today.  A throat culture has been ordered.   Take medication as prescribed. Recommend using a humidifier at bedtime to help with cough and nasal congestion. Increase fluids and allow for plenty of rest. May continue ibuprofen or Tylenol for pain or fever. Warm salt water gargles 3-4 times daily until symptoms improve. Recommended warm or cool liquids, which ever is most soothing to the throat.  Also recommend a soft diet such as soups, broths, puddings, Jell-O's, throat pain persist. Follow-up with your primary care physician if symptoms do not improve.     ED Prescriptions     Medication Sig Dispense Auth. Provider   amoxicillin-clavulanate (AUGMENTIN) 875-125 MG tablet Take 1 tablet by mouth every 12 (twelve) hours. 14 tablet Enya Bureau-Warren, Alda Lea, NP   predniSONE (DELTASONE) 20 MG tablet Take 1 tablet (20 mg total) by mouth daily with breakfast for 5 days. 5 tablet Miquel Lamson-Warren, Alda Lea, NP  PDMP not reviewed this encounter.   Tish Men, NP 09/11/21 1311

## 2021-09-11 NOTE — Discharge Instructions (Addendum)
Your rapid strep test was negative today.  A throat culture has been ordered.   Take medication as prescribed. Recommend using a humidifier at bedtime to help with cough and nasal congestion. Increase fluids and allow for plenty of rest. May continue ibuprofen or Tylenol for pain or fever. Warm salt water gargles 3-4 times daily until symptoms improve. Recommended warm or cool liquids, which ever is most soothing to the throat.  Also recommend a soft diet such as soups, broths, puddings, Jell-O's, throat pain persist. Follow-up with your primary care physician if symptoms do not improve.

## 2021-09-11 NOTE — ED Notes (Signed)
Pt declined the COVID, Flu test.

## 2021-09-14 LAB — CULTURE, GROUP A STREP (THRC)

## 2021-09-28 ENCOUNTER — Ambulatory Visit (INDEPENDENT_AMBULATORY_CARE_PROVIDER_SITE_OTHER): Payer: BC Managed Care – PPO | Admitting: Family Medicine

## 2021-09-28 ENCOUNTER — Encounter: Payer: Self-pay | Admitting: Family Medicine

## 2021-09-28 DIAGNOSIS — J209 Acute bronchitis, unspecified: Secondary | ICD-10-CM | POA: Diagnosis not present

## 2021-09-28 MED ORDER — DOXYCYCLINE HYCLATE 100 MG PO TABS: 100.0000 mg | ORAL_TABLET | Freq: Two times a day (BID) | ORAL | 0 refills | Status: DC

## 2021-09-28 MED ORDER — PROMETHAZINE-DM 6.25-15 MG/5ML PO SYRP: 5.0000 mL | ORAL_SOLUTION | Freq: Four times a day (QID) | ORAL | 0 refills | Status: DC | PRN

## 2021-09-29 DIAGNOSIS — J209 Acute bronchitis, unspecified: Secondary | ICD-10-CM | POA: Insufficient documentation

## 2021-09-29 NOTE — Progress Notes (Signed)
Subjective:  Patient ID: Ryan Gibson, male    DOB: 1975-04-02  Age: 46 y.o. MRN: [id number]  CC: Chief Complaint  Patient presents with   Cough    Pt having cough for a while. Pt states he has been coughing up green mucus. Went to Urgent Care last Saturday.     HPI:  46 year old male with history of PE, CHF/cardiomyopathy, hyperlipidemia, hypertension, hypogonadism presents with cough.  Patient reports he has been sick for the past few weeks.  He was recently seen on 7/22 at a local urgent care and was diagnosed with tonsillitis.  He was treated with Augmentin and prednisone.  Patient reports that despite the treatment he continues to feel poorly.  He is having productive cough.  He states that his cough is productive of green sputum.  No documented fever.  Associated congestion.  No relieving factors.  No other complaints.  Patient Active Problem List   Diagnosis Date Noted   Acute bronchitis 09/29/2021   Hyperlipidemia 06/16/2021   Dilated cardiomyopathy (HCC) 12/16/2020   Systolic CHF (HCC) 12/16/2020   Family history of early CAD 12/16/2020   Pulmonary embolism (HCC) 02/16/2020   Depression, major, single episode, moderate (HCC) 03/07/2018   Hypogonadism male 11/24/2015   Gout 11/01/2014   Essential hypertension 03/10/2014    Social Hx   Social History   Socioeconomic History   Marital status: Married    Spouse name: Not on file   Number of children: 4   Years of education: Not on file   Highest education level: Not on file  Occupational History   Not on file  Tobacco Use   Smoking status: Never   Smokeless tobacco: Current    Types: Chew  Vaping Use   Vaping Use: Never used  Substance and Sexual Activity   Alcohol use: Yes    Comment: Social   Drug use: No   Sexual activity: Yes    Birth control/protection: None    Comment: Married  Other Topics Concern   Not on file  Social History Narrative   Married for 13 years.Lives with wife and  kids.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   Social Determinants of Health   Financial Resource Strain: Not on file  Food Insecurity: Not on file  Transportation Needs: Not on file  Physical Activity: Not on file  Stress: Not on file  Social Connections: Not on file    Review of Systems Per HPI  Objective:  BP 131/89   Pulse (!) 102   Temp 97.9 F (36.6 C)   Wt 268 lb 6.4 oz (121.7 kg)   SpO2 97%   BMI 34.93 kg/m      09/28/2021    3:41 PM 09/11/2021   12:28 PM 08/04/2021    9:03 AM  BP/Weight  Systolic BP 131 152 138  Diastolic BP 89 94 89  Wt. (Lbs) 268.4    BMI 34.93 kg/m2      Physical Exam Vitals and nursing note reviewed.  Constitutional:      General: He is not in acute distress.    Appearance: Normal appearance.  HENT:     Head: Normocephalic and atraumatic.     Right Ear: Tympanic membrane normal.     Left Ear: Tympanic membrane normal.     Mouth/Throat:     Pharynx: Oropharynx is clear.  Eyes:     General:        Right eye: No discharge.  Left eye: No discharge.     Conjunctiva/sclera: Conjunctivae normal.  Cardiovascular:     Rate and Rhythm: Normal rate and regular rhythm.  Pulmonary:     Effort: Pulmonary effort is normal.     Breath sounds: Normal breath sounds. No wheezing or rales.  Neurological:     Mental Status: He is alert.  Psychiatric:        Mood and Affect: Mood normal.        Behavior: Behavior normal.     Lab Results  Component Value Date   WBC 8.6 12/30/2020   HGB 14.8 12/30/2020   HCT 41.6 12/30/2020   PLT 298 12/30/2020   GLUCOSE 84 12/30/2020   CHOL 160 07/23/2020   TRIG 190 (H) 07/23/2020   HDL 50 07/23/2020   LDLDIRECT 82 07/23/2020   LDLCALC 78 07/23/2020   ALT 24 12/30/2020   AST 24 12/30/2020   NA 143 12/30/2020   K 4.0 12/30/2020   CL 102 12/30/2020   CREATININE 0.84 12/30/2020   BUN 10 12/30/2020   CO2 24 12/30/2020   TSH 2.520 07/23/2020   INR 1.4 (H) 02/16/2020     Assessment &  Plan:   Problem List Items Addressed This Visit       Respiratory   Acute bronchitis    Treating with doxycycline and Promethazine DM.       Meds ordered this encounter  Medications   doxycycline (VIBRA-TABS) 100 MG tablet    Sig: Take 1 tablet (100 mg total) by mouth 2 (two) times daily.    Dispense:  14 tablet    Refill:  0   promethazine-dextromethorphan (PROMETHAZINE-DM) 6.25-15 MG/5ML syrup    Sig: Take 5 mLs by mouth 4 (four) times daily as needed for cough.    Dispense:  118 mL    Refill:  0    Follow-up:  If symptoms worsen or fail to improve  Everlene Other DO [facility]

## 2021-09-29 NOTE — Assessment & Plan Note (Signed)
Treating with doxycycline and Promethazine DM. 

## 2021-11-17 ENCOUNTER — Encounter: Payer: Self-pay | Admitting: Internal Medicine

## 2021-11-17 ENCOUNTER — Ambulatory Visit: Payer: BC Managed Care – PPO | Admitting: Internal Medicine

## 2021-11-17 VITALS — BP 190/120 | HR 94 | Temp 98.0°F | Resp 16 | Ht 72.0 in | Wt 269.0 lb

## 2021-11-17 DIAGNOSIS — I10 Essential (primary) hypertension: Secondary | ICD-10-CM

## 2021-11-17 DIAGNOSIS — Z86711 Personal history of pulmonary embolism: Secondary | ICD-10-CM | POA: Insufficient documentation

## 2021-11-17 DIAGNOSIS — I5022 Chronic systolic (congestive) heart failure: Secondary | ICD-10-CM | POA: Diagnosis not present

## 2021-11-17 MED ORDER — SERTRALINE HCL 100 MG PO TABS: 100.0000 mg | ORAL_TABLET | Freq: Every day | ORAL | 6 refills | Status: DC

## 2021-11-17 MED ORDER — FUROSEMIDE 40 MG PO TABS: 40.0000 mg | ORAL_TABLET | Freq: Every day | ORAL | 6 refills | Status: DC | PRN

## 2021-11-17 MED ORDER — APIXABAN 5 MG PO TABS: 5.0000 mg | ORAL_TABLET | Freq: Two times a day (BID) | ORAL | 6 refills | Status: DC

## 2021-11-17 MED ORDER — ROSUVASTATIN CALCIUM 20 MG PO TABS: 20.0000 mg | ORAL_TABLET | Freq: Every day | ORAL | 3 refills | Status: DC

## 2021-11-17 MED ORDER — ENTRESTO 49-51 MG PO TABS
1.0000 | ORAL_TABLET | Freq: Two times a day (BID) | ORAL | 6 refills | Status: DC
Start: 2021-11-17 — End: 2021-11-17

## 2021-11-17 MED ORDER — SPIRONOLACTONE 25 MG PO TABS: 25.0000 mg | ORAL_TABLET | Freq: Every day | ORAL | 3 refills | Status: DC

## 2021-11-17 MED ORDER — METOPROLOL SUCCINATE ER 100 MG PO TB24: 150.0000 mg | ORAL_TABLET | Freq: Every day | ORAL | 3 refills | Status: DC

## 2021-11-17 MED ORDER — EMPAGLIFLOZIN 10 MG PO TABS: 10.0000 mg | ORAL_TABLET | Freq: Every day | ORAL | 6 refills | Status: DC

## 2021-11-17 NOTE — Progress Notes (Signed)
Primary Physician/Referring:  Tommie Sams, DO  Patient ID: Ryan Gibson, male    DOB: 02-02-1976, 46 y.o.   MRN: [id number]  Chief Complaint  Patient presents with   Congestive Heart Failure   Follow-up    6 month   HPI:    Atthew Mundinger  is a 46 y.o. Caucasian male patient with hypertension, tobacco use disorder, alcohol abuse, strong family history of premature coronary disease in his father who had coronary artery disease in his late 75s.   Patient presented to [facility] in 01/2020 where he was diagnosed with acute bilateral pulmonary emboli, DVT of the right lower extremity, and severe dilated cardiomyopathy with LVEF <20% as well as acute cholecystitis.  Initially, it was suspected that DVT and PE were related to underlying COVID-19 infection, however patient does not have any documented covid infection. Patient was initiated on guideline directed medical therapy including Jardiance, metoprolol, Entresto, Aldactone, Crestor, and Lasix.  Since then his admission in 01/2020 patient has reduced alcohol intake and recent repeat echocardiogram in 06/2021 revealed LVEF improved to 40-45%.   He has not been able to afford all of his medications. Samples given today and all new scripts sent to pharmacy. Since patient does not have documented covid infection during the time he developed PE/DVT, would opt to stay on Eliquis for life if no bleeding issues.  Past Medical History:  Diagnosis Date   Asthma    AS A CHILD ONLY   Foot pain, right    painful x 1 month and swelling   Past Surgical History:  Procedure Laterality Date   HERNIA REPAIR  07/13/2011   [facility]   INGUINAL HERNIA REPAIR  07/13/2011   Procedure: HERNIA REPAIR INGUINAL ADULT;  Surgeon: Wilmon Arms. Corliss Skains, MD;  Location: [facility];  Service: General;  Laterality: Right;   NO PREVIOUS SURGERY     Family History  Problem Relation Age of Onset   Healthy Mother    Heart disease Father     Social History   Tobacco Use    Smoking status: Never   Smokeless tobacco: Current    Types: Chew  Substance Use Topics   Alcohol use: Yes    Comment: Social   Marital Status: Married  ROS  Review of Systems  Cardiovascular:  Negative for chest pain, dyspnea on exertion, leg swelling, orthopnea and paroxysmal nocturnal dyspnea.  Gastrointestinal:  Negative for melena.  Neurological:  Negative for dizziness.   Objective  Blood pressure (!) 190/120, pulse 94, temperature 98 F (36.7 C), resp. rate 16, height 6' (1.829 m), weight 269 lb (122 kg), SpO2 98 %. Body mass index is 36.48 kg/m.      11/17/2021   11:41 AM 11/17/2021   11:27 AM 09/28/2021    3:41 PM  Vitals with BMI  Height  6\' 0"    Weight  269 lbs 268 lbs 6 oz  BMI  36.48   Systolic 190 170 063  Diastolic 120 110 89  Pulse 94 016 102     Physical Exam Vitals reviewed.  Constitutional:      Appearance: He is obese.  Neck:     Vascular: No carotid bruit or JVD.  Cardiovascular:     Rate and Rhythm: Normal rate and regular rhythm.     Pulses: Normal pulses and intact distal pulses.     Heart sounds: No murmur heard.    No gallop.     Comments: Varicose veins bilateral lower extremities, left worse  Pulmonary:     Effort: Pulmonary effort is normal.     Breath sounds: Normal breath sounds.  Musculoskeletal:     Right lower leg: No edema.     Left lower leg: No edema.   Physical exam unchanged compared to previous office visit.  Laboratory examination:   Recent Labs    12/30/20 1639  NA 143  K 4.0  CL 102  CO2 24  GLUCOSE 84  BUN 10  CREATININE 0.84  CALCIUM 8.9   CrCl cannot be calculated (Patient's most recent lab result is older than the maximum 21 days allowed.).     Latest Ref Rng & Units 12/30/2020    4:39 PM 07/23/2020   10:15 AM 02/22/2020    5:00 AM  CMP  Glucose 70 - 99 mg/dL 84  86  80   BUN 6 - 24 mg/dL 10  12  8    Creatinine 0.76 - 1.27 mg/dL 1.61  0.96  0.45   Sodium 134 - 144 mmol/L 143  143  134   Potassium 3.5 -  5.2 mmol/L 4.0  4.5  3.7   Chloride 96 - 106 mmol/L 102  102  91   CO2 20 - 29 mmol/L 24  26  34   Calcium 8.7 - 10.2 mg/dL 8.9  9.3  8.4   Total Protein 6.0 - 8.5 g/dL 7.1  7.0  5.6   Total Bilirubin 0.0 - 1.2 mg/dL 0.7  0.9  1.9   Alkaline Phos 44 - 121 IU/L 46  46  50   AST 0 - 40 IU/L 24  31  330   ALT 0 - 44 IU/L 24  26  833       Latest Ref Rng & Units 12/30/2020    4:39 PM 02/22/2020    5:00 AM 02/21/2020    6:45 AM  CBC  WBC 3.4 - 10.8 x10E3/uL 8.6  5.9  6.8   Hemoglobin 13.0 - 17.7 g/dL 40.9  81.1  91.4   Hematocrit 37.5 - 51.0 % 41.6  46.8  47.2   Platelets 150 - 450 x10E3/uL 298  193  200    Lipid Panel     Component Value Date/Time   CHOL 160 07/23/2020 1015   TRIG 190 (H) 07/23/2020 1015   HDL 50 07/23/2020 1015   CHOLHDL 5.6 (H) 11/20/2019 1528   LDLCALC 78 07/23/2020 1015   LDLDIRECT 82 07/23/2020 1015   LABVLDL 32 07/23/2020 1015    HEMOGLOBIN A1C No results found for: "HGBA1C", "MPG" TSH No results for input(s): "TSH" in the last 8760 hours.  External labs: 07/06/2021: Hgb 14.7, HCT 42.5, platelet 193 AST 30, ALT 24, BUN 11, creatinine 0.82, GFR >60, potassium 4.0, sodium 139  Allergies   Allergies  Allergen Reactions   Vancomycin Hives    hives post op on R arm -site of infusion, and whole body itching    Medication prior to this encounter:   Outpatient Medications Prior to Visit  Medication Sig Dispense Refill   colchicine 0.6 MG tablet Take 0.6 mg by mouth daily.     febuxostat (ULORIC) 40 MG tablet Take 40 mg by mouth daily.     JARDIANCE 10 MG TABS tablet Take 1 tablet (10 mg total) by mouth daily. 90 tablet 3   sacubitril-valsartan (ENTRESTO) 49-51 MG Take 1 tablet by mouth 2 (two) times daily. 180 tablet 1   Testosterone Enanthate 200 MG/ML SOLN Inject 200 mg as directed once a week.  5 mL 1   VERQUVO 10 MG TABS Take 1 tablet by mouth daily.     apixaban (ELIQUIS) 5 MG TABS tablet Take 1 tablet (5 mg total) by mouth 2 (two) times daily.  Only start this script after taking eliquis 10mg  BID x 6 days 180 tablet 3   doxycycline (VIBRA-TABS) 100 MG tablet Take 1 tablet (100 mg total) by mouth 2 (two) times daily. 14 tablet 0   furosemide (LASIX) 40 MG tablet Take 1 tablet (40 mg total) by mouth daily as needed for fluid or edema. 90 tablet 3   metoprolol succinate (TOPROL-XL) 100 MG 24 hr tablet TAKE 1 AND 1/2 TABLETS(150 MG) BY MOUTH DAILY 135 tablet 1   ondansetron (ZOFRAN) 4 MG tablet Take 1 tablet (4 mg total) by mouth every 8 (eight) hours as needed for nausea or vomiting. 20 tablet 0   promethazine-dextromethorphan (PROMETHAZINE-DM) 6.25-15 MG/5ML syrup Take 5 mLs by mouth 4 (four) times daily as needed for cough. 118 mL 0   rosuvastatin (CRESTOR) 20 MG tablet TAKE 1 TABLET(20 MG) BY MOUTH DAILY 90 tablet 3   sertraline (ZOLOFT) 50 MG tablet TAKE 1 TABLET(50 MG) BY MOUTH DAILY 90 tablet 1   spironolactone (ALDACTONE) 25 MG tablet Take 1 tablet (25 mg total) by mouth daily. 90 tablet 3   XYOSTED 75 MG/0.5ML SOAJ Inject into the skin.     No facility-administered medications prior to visit.    FINAL MEDICATION AS OF TODAY:   Current Meds  Medication Sig   apixaban (ELIQUIS) 5 MG TABS tablet Take 1 tablet (5 mg total) by mouth 2 (two) times daily.   colchicine 0.6 MG tablet Take 0.6 mg by mouth daily.   empagliflozin (JARDIANCE) 10 MG TABS tablet Take 1 tablet (10 mg total) by mouth daily before breakfast.   febuxostat (ULORIC) 40 MG tablet Take 40 mg by mouth daily.   furosemide (LASIX) 40 MG tablet Take 1 tablet (40 mg total) by mouth daily as needed.   JARDIANCE 10 MG TABS tablet Take 1 tablet (10 mg total) by mouth daily.   metoprolol succinate (TOPROL-XL) 100 MG 24 hr tablet Take 1.5 tablets (150 mg total) by mouth daily. Take with or immediately following a meal.   rosuvastatin (CRESTOR) 20 MG tablet Take 1 tablet (20 mg total) by mouth at bedtime.   sacubitril-valsartan (ENTRESTO) 49-51 MG Take 1 tablet by mouth 2  (two) times daily.   sertraline (ZOLOFT) 100 MG tablet Take 1 tablet (100 mg total) by mouth daily.   spironolactone (ALDACTONE) 25 MG tablet Take 1 tablet (25 mg total) by mouth daily.   Testosterone Enanthate 200 MG/ML SOLN Inject 200 mg as directed once a week.   VERQUVO 10 MG TABS Take 1 tablet by mouth daily.   [DISCONTINUED] apixaban (ELIQUIS) 5 MG TABS tablet Take 1 tablet (5 mg total) by mouth 2 (two) times daily. Only start this script after taking eliquis 10mg  BID x 6 days   [DISCONTINUED] doxycycline (VIBRA-TABS) 100 MG tablet Take 1 tablet (100 mg total) by mouth 2 (two) times daily.   [DISCONTINUED] furosemide (LASIX) 40 MG tablet Take 1 tablet (40 mg total) by mouth daily as needed for fluid or edema.   [DISCONTINUED] metoprolol succinate (TOPROL-XL) 100 MG 24 hr tablet TAKE 1 AND 1/2 TABLETS(150 MG) BY MOUTH DAILY   [DISCONTINUED] ondansetron (ZOFRAN) 4 MG tablet Take 1 tablet (4 mg total) by mouth every 8 (eight) hours as needed for nausea or vomiting.   [  DISCONTINUED] promethazine-dextromethorphan (PROMETHAZINE-DM) 6.25-15 MG/5ML syrup Take 5 mLs by mouth 4 (four) times daily as needed for cough.   [DISCONTINUED] rosuvastatin (CRESTOR) 20 MG tablet TAKE 1 TABLET(20 MG) BY MOUTH DAILY   [DISCONTINUED] sacubitril-valsartan (ENTRESTO) 49-51 MG Take 1 tablet by mouth 2 (two) times daily.   [DISCONTINUED] sertraline (ZOLOFT) 50 MG tablet TAKE 1 TABLET(50 MG) BY MOUTH DAILY   [DISCONTINUED] spironolactone (ALDACTONE) 25 MG tablet Take 1 tablet (25 mg total) by mouth daily.   [DISCONTINUED] XYOSTED 75 MG/0.5ML SOAJ Inject into the skin.    Radiology:   CT angiogram 02/16/2020: Bilateral pulmonary emboli worst in the right lower lobe. No findings to suggest right heart strain are noted.   Bilateral patchy infiltrates likely related to the underlying emboli and may represent early sequelae of pulmonary infarct.   Critical Value/emergent results were called by telephone at the  time of interpretation on 02/16/2020 at 10:49 am to Dr. Willy Eddy , who verbally acknowledged these results.  Cardiac Studies:   PCV MYOCARDIAL PERFUSION WITH LEXISCAN 12/09/2020 Lexiscan/modified Bruce nuclear stress test performed using 1-day protocol. Stress EKG is non-diagnostic, as this is pharmacological stress test. In addition, rest and stress EKG showed sinus rhythm, frequent PVC's, nonspecific ST-T changes. SPECT images show small sized, medium intensity, fixed perfusion defect in basal inferior myocardium. Dilated left ventricular cavity with severe global myocardial thickening and wall motion. Stress LVEF 23%. Findings most likely represent dilated cardiomyopathy. High risk study,  Echocardiogram 07/13/2021:  Mildly depressed LV systolic function with visual EF 40-45%. Left  ventricle cavity is dilated. Normal left ventricular wall thickness.  Hypokinetic global wall motion. Doppler evidence of grade I (impaired)  diastolic dysfunction.  Mild (Grade I) mitral regurgitation.  Compared to 12/09/2020 LVEF improved from 35-40% to 40-45% otherwise no significant change.    EKG:   11/17/2021: Sinus Tachycardia Left axis. Left atrial enlargement. Nonspecific ST depression  03/23/2021: Sinus rhythm rate of 70 bpm.  Normal axis.  Incomplete right bundle branch block.  Borderline LVH criteria.  EKG 07/16/2020: Normal sinus rhythm with rate of 69 bpm, left atrial enlargement, left axis deviation, left anterior fascicular block.  Incomplete right bundle branch block.  LVH.      Assessment     ICD-10-CM   1. Chronic systolic heart failure (HCC)  M84.13 EKG 12-Lead    PCV ECHOCARDIOGRAM COMPLETE    2. History of pulmonary embolism  Z86.711 PCV ECHOCARDIOGRAM COMPLETE    3. Essential hypertension  I10 PCV ECHOCARDIOGRAM COMPLETE      Medications Discontinued During This Encounter  Medication Reason   ondansetron (ZOFRAN) 4 MG tablet    promethazine-dextromethorphan  (PROMETHAZINE-DM) 6.25-15 MG/5ML syrup    XYOSTED 75 MG/0.5ML SOAJ    doxycycline (VIBRA-TABS) 100 MG tablet    sertraline (ZOLOFT) 50 MG tablet    apixaban (ELIQUIS) 5 MG TABS tablet    furosemide (LASIX) 40 MG tablet    metoprolol succinate (TOPROL-XL) 100 MG 24 hr tablet    rosuvastatin (CRESTOR) 20 MG tablet    spironolactone (ALDACTONE) 25 MG tablet    sacubitril-valsartan (ENTRESTO) 49-51 MG     Meds ordered this encounter  Medications   sertraline (ZOLOFT) 100 MG tablet    Sig: Take 1 tablet (100 mg total) by mouth daily.    Dispense:  30 tablet    Refill:  6   apixaban (ELIQUIS) 5 MG TABS tablet    Sig: Take 1 tablet (5 mg total) by mouth 2 (two) times daily.  Dispense:  60 tablet    Refill:  6   furosemide (LASIX) 40 MG tablet    Sig: Take 1 tablet (40 mg total) by mouth daily as needed.    Dispense:  30 tablet    Refill:  6   empagliflozin (JARDIANCE) 10 MG TABS tablet    Sig: Take 1 tablet (10 mg total) by mouth daily before breakfast.    Dispense:  30 tablet    Refill:  6   metoprolol succinate (TOPROL-XL) 100 MG 24 hr tablet    Sig: Take 1.5 tablets (150 mg total) by mouth daily. Take with or immediately following a meal.    Dispense:  90 tablet    Refill:  3   rosuvastatin (CRESTOR) 20 MG tablet    Sig: Take 1 tablet (20 mg total) by mouth at bedtime.    Dispense:  90 tablet    Refill:  3   spironolactone (ALDACTONE) 25 MG tablet    Sig: Take 1 tablet (25 mg total) by mouth daily.    Dispense:  90 tablet    Refill:  3   DISCONTD: sacubitril-valsartan (ENTRESTO) 49-51 MG    Sig: Take 1 tablet by mouth 2 (two) times daily.    Dispense:  60 tablet    Refill:  6   Orders Placed This Encounter  Procedures   EKG 12-Lead   PCV ECHOCARDIOGRAM COMPLETE    Standing Status:   Future    Standing Expiration Date:   11/18/2022    Recommendations:   Hiep Ripley is a 46 y.o. Caucasian male patient with hypertension, tobacco use disorder, alcohol abuse,  strong family history of premature coronary disease in his father who had coronary artery disease in his late 80s.   Patient presented to [facility] in 01/2020 where he was diagnosed with acute bilateral pulmonary emboli, DVT of the right lower extremity, and severe dilated cardiomyopathy with LVEF <20% as well as acute cholecystitis.  Initially, it was suspected that DVT and PE were related to underlying COVID-19 infection, however patient does not have any documented covid infection. Patient was initiated on guideline directed medical therapy including Jardiance, metoprolol, Entresto, Aldactone, Crestor, and Lasix.  Since then his admission in 01/2020 patient has reduced alcohol intake and recent repeat echocardiogram in 06/2021 revealed LVEF improved to 40-45%. Will repeat echo in about 3 months.  Patient was not taking his medications for the last month or two due to the price.  Samples of Jardiance, Eliquis, and Entresto given to patient. Assistance paperwork for [employer] given. Coupons for Jardiance and Eliquis also provided. All new scripts sent to phramacy. Zoloft increased to 100mg  q Day Patient to keep HR/BP log at home as it is very uncontrolled today  Follow-up in 6 months, sooner if needed.   Clotilde Dieter, DO, [facility] 11/17/2021, 12:22 PM Office: [phone]

## 2021-11-18 ENCOUNTER — Other Ambulatory Visit: Payer: Self-pay

## 2021-11-18 MED ORDER — EMPAGLIFLOZIN 10 MG PO TABS
10.0000 mg | ORAL_TABLET | Freq: Every day | ORAL | 6 refills | Status: AC
Start: 2021-11-18 — End: ?

## 2021-11-18 MED ORDER — ROSUVASTATIN CALCIUM 20 MG PO TABS: 20.0000 mg | ORAL_TABLET | Freq: Every day | ORAL | 3 refills | Status: DC

## 2021-11-18 MED ORDER — METOPROLOL SUCCINATE ER 100 MG PO TB24: 150.0000 mg | ORAL_TABLET | Freq: Every day | ORAL | 3 refills | Status: DC

## 2021-11-18 MED ORDER — APIXABAN 5 MG PO TABS: 5.0000 mg | ORAL_TABLET | Freq: Two times a day (BID) | ORAL | 6 refills | Status: DC

## 2021-11-18 MED ORDER — FUROSEMIDE 40 MG PO TABS: 40.0000 mg | ORAL_TABLET | Freq: Every day | ORAL | 6 refills | Status: DC | PRN

## 2021-11-18 MED ORDER — SPIRONOLACTONE 25 MG PO TABS: 25.0000 mg | ORAL_TABLET | Freq: Every day | ORAL | 3 refills | Status: DC

## 2021-11-18 NOTE — Telephone Encounter (Signed)
From pt

## 2021-12-02 DIAGNOSIS — M25571 Pain in right ankle and joints of right foot: Secondary | ICD-10-CM | POA: Diagnosis not present

## 2021-12-21 DIAGNOSIS — M25572 Pain in left ankle and joints of left foot: Secondary | ICD-10-CM | POA: Diagnosis not present

## 2021-12-21 DIAGNOSIS — M25571 Pain in right ankle and joints of right foot: Secondary | ICD-10-CM | POA: Diagnosis not present

## 2021-12-21 DIAGNOSIS — M25561 Pain in right knee: Secondary | ICD-10-CM | POA: Diagnosis not present

## 2021-12-21 DIAGNOSIS — M25562 Pain in left knee: Secondary | ICD-10-CM | POA: Diagnosis not present

## 2021-12-24 DIAGNOSIS — M25562 Pain in left knee: Secondary | ICD-10-CM | POA: Diagnosis not present

## 2022-02-02 ENCOUNTER — Ambulatory Visit: Payer: BC Managed Care – PPO | Admitting: Student

## 2022-02-09 NOTE — Progress Notes (Signed)
Office Visit Note  Patient: Ryan Gibson             Date of Birth: 1976-02-06           MRN: [id number]             PCP: Coral Spikes, DO Referring: Faythe Casa, PA Visit Date: 02/23/2022 Occupation: _0 @  Subjective:  Pain in multiple joints   History of Present Illness: Ryan Gibson is a 46 y.o. male who presents today for new patient consultation as requested by Dr. Stann Mainland.  According to the patient he has been experiencing intermittent joint pain and inflammation involving multiple joints since 2015.  He was initially evaluated by Dr. Stann Mainland but was then later referred to [facility] rheumatology.  He is unsure of how long he was under the care of [facility] rheumatology but states that during that time he was being treated for gout.  He states that he also had a positive rheumatoid factor in the past but has not been treated with any immunosuppressive agents previously.  He was started on Uloric and colchicine for management of gout which he took for about 6 months with no improvement in his joint pain and inflammation.  He continued to have recurrent flares which were only responsive to prednisone.  In the past he has had no response to Tylenol or NSAID use.  He states that he discontinued Uloric and colchicine about 2 to 3 months ago and his symptoms have been unchanged.  His joint pain is typically in both wrists, both knees, and both ankle joints.  He has a history of Achilles tendinitis of the left foot.  He completed a prednisone taper about 2 weeks ago which alleviated his flare.  He denies any joint swelling currently.  He denies any neck or lower back discomfort.  He has not had any hip pain. He denies any history of arthroscopic surgery. He denies any personal history of psoriasis, uveitis, Crohn's disease, ulcerative colitis.  He denies any known family history of any rheumatologic conditions. He had a pulmonary embolism about 1 year ago and remains under  the care of cardiology. He is taking eliquis as prescribed.   Activities of Daily Living:  Patient reports morning stiffness for 0 minute.   Patient Reports nocturnal pain.  Difficulty dressing/grooming: Denies Difficulty climbing stairs: Denies Difficulty getting out of chair: Denies Difficulty using hands for taps, buttons, cutlery, and/or writing: Denies  Review of Systems  Constitutional:  Positive for fatigue.  HENT:  Negative for mouth sores and mouth dryness.   Eyes:  Negative for dryness.  Respiratory:  Negative for shortness of breath.   Cardiovascular:  Negative for chest pain and palpitations.  Gastrointestinal:  Negative for blood in stool, constipation and diarrhea.  Endocrine: Negative for increased urination.  Genitourinary:  Negative for involuntary urination.  Musculoskeletal:  Positive for joint pain, joint pain and joint swelling. Negative for gait problem, myalgias, muscle weakness, morning stiffness, muscle tenderness and myalgias.  Skin:  Negative for color change, hair loss and sensitivity to sunlight.  Allergic/Immunologic: Negative for susceptible to infections.  Neurological:  Negative for dizziness and headaches.  Hematological:  Negative for swollen glands.  Psychiatric/Behavioral:  Negative for depressed mood and sleep disturbance. The patient is not nervous/anxious.     PMFS History:  Patient Active Problem List   Diagnosis Date Noted   History of pulmonary embolism 54/65/6812   Chronic systolic heart failure ([place]) 11/17/2021   Acute bronchitis  09/29/2021   Hyperlipidemia 06/16/2021   Dilated cardiomyopathy (HCC) 12/16/2020   Systolic CHF (HCC) 12/16/2020   Family history of early CAD 12/16/2020   Pulmonary embolism (HCC) 02/16/2020   Depression, major, single episode, moderate (HCC) 03/07/2018   Hypogonadism male 11/24/2015   Gout 11/01/2014   Essential hypertension 03/10/2014    Past Medical History:  Diagnosis Date   Asthma    AS A CHILD  ONLY   Foot pain, right    painful x 1 month and swelling    Family History  Problem Relation Age of Onset   Healthy Mother    Heart disease Father    Past Surgical History:  Procedure Laterality Date   HERNIA REPAIR  07/13/2011   [facility]   INGUINAL HERNIA REPAIR  07/13/2011   Procedure: HERNIA REPAIR INGUINAL ADULT;  Surgeon: Wilmon Arms. Corliss Skains, MD;  Location: [facility] ORS;  Service: General;  Laterality: Right;   NO PREVIOUS SURGERY     Social History   Social History Narrative   Married for 13 years.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   Immunization History  Administered Date(s) Administered   Tdap 03/07/2018     Objective: Vital Signs: BP (!) 147/103 (BP Location: Left Arm, Patient Position: Sitting, Cuff Size: Large)   Pulse 78   Resp 16   Ht 6\' 2"  (1.88 m)   Wt 261 lb (118.4 kg)   BMI 33.51 kg/m    Physical Exam Vitals and nursing note reviewed.  Constitutional:      Appearance: He is well-developed.  HENT:     Head: Normocephalic and atraumatic.  Eyes:     Conjunctiva/sclera: Conjunctivae normal.     Pupils: Pupils are equal, round, and reactive to light.  Cardiovascular:     Rate and Rhythm: Normal rate and regular rhythm.     Heart sounds: Normal heart sounds.  Pulmonary:     Effort: Pulmonary effort is normal.     Breath sounds: Normal breath sounds.  Abdominal:     General: Bowel sounds are normal.     Palpations: Abdomen is soft.  Musculoskeletal:     Cervical back: Normal range of motion and neck supple.  Skin:    General: Skin is warm and dry.     Capillary Refill: Capillary refill takes less than 2 seconds.     Comments: No fingernail pitting noted. Onychomycosis in several toenails noted.  Peeling of skin on plantar aspect of both feet, left > right. 2 large nodules noted on the extensor surface of the left elbow-tophi vs. Rheumatoid nodule probable.   Neurological:     Mental Status: He is alert and oriented to  person, place, and time.  Psychiatric:        Behavior: Behavior normal.      Musculoskeletal Exam: C-spine, thoracic spine, and lumbar spine good ROM.  No midline spinal tenderness or SI joint tenderness.  Shoulder joints have good ROM.  Slightly limited extension of the left wrist with some tenderness and fullness.  Right wrist joint has good ROM with no tenderness or synovitis.  No tenderness over MCP or PIP joints.  Hip joints have good ROM with no groin pain.  Knee joints have good ROM with some discomfort bilaterally.  No warmth or effusion of knee joints currently.  Ankle joints have good ROM with some tenderness over the right ankle.  No evidence of achilles tendonitis or plantar fasciitis.   CDAI Exam: CDAI Score: -- Patient Global: --;  Office Visit Note  Patient: Ryan Gibson             Date of Birth: 1976-02-06           MRN: [id number]             PCP: Coral Spikes, DO Referring: Faythe Casa, PA Visit Date: 02/23/2022 Occupation: _0 @  Subjective:  Pain in multiple joints   History of Present Illness: Ryan Gibson is a 46 y.o. male who presents today for new patient consultation as requested by Dr. Stann Mainland.  According to the patient he has been experiencing intermittent joint pain and inflammation involving multiple joints since 2015.  He was initially evaluated by Dr. Stann Mainland but was then later referred to [facility] rheumatology.  He is unsure of how long he was under the care of [facility] rheumatology but states that during that time he was being treated for gout.  He states that he also had a positive rheumatoid factor in the past but has not been treated with any immunosuppressive agents previously.  He was started on Uloric and colchicine for management of gout which he took for about 6 months with no improvement in his joint pain and inflammation.  He continued to have recurrent flares which were only responsive to prednisone.  In the past he has had no response to Tylenol or NSAID use.  He states that he discontinued Uloric and colchicine about 2 to 3 months ago and his symptoms have been unchanged.  His joint pain is typically in both wrists, both knees, and both ankle joints.  He has a history of Achilles tendinitis of the left foot.  He completed a prednisone taper about 2 weeks ago which alleviated his flare.  He denies any joint swelling currently.  He denies any neck or lower back discomfort.  He has not had any hip pain. He denies any history of arthroscopic surgery. He denies any personal history of psoriasis, uveitis, Crohn's disease, ulcerative colitis.  He denies any known family history of any rheumatologic conditions. He had a pulmonary embolism about 1 year ago and remains under  the care of cardiology. He is taking eliquis as prescribed.   Activities of Daily Living:  Patient reports morning stiffness for 0 minute.   Patient Reports nocturnal pain.  Difficulty dressing/grooming: Denies Difficulty climbing stairs: Denies Difficulty getting out of chair: Denies Difficulty using hands for taps, buttons, cutlery, and/or writing: Denies  Review of Systems  Constitutional:  Positive for fatigue.  HENT:  Negative for mouth sores and mouth dryness.   Eyes:  Negative for dryness.  Respiratory:  Negative for shortness of breath.   Cardiovascular:  Negative for chest pain and palpitations.  Gastrointestinal:  Negative for blood in stool, constipation and diarrhea.  Endocrine: Negative for increased urination.  Genitourinary:  Negative for involuntary urination.  Musculoskeletal:  Positive for joint pain, joint pain and joint swelling. Negative for gait problem, myalgias, muscle weakness, morning stiffness, muscle tenderness and myalgias.  Skin:  Negative for color change, hair loss and sensitivity to sunlight.  Allergic/Immunologic: Negative for susceptible to infections.  Neurological:  Negative for dizziness and headaches.  Hematological:  Negative for swollen glands.  Psychiatric/Behavioral:  Negative for depressed mood and sleep disturbance. The patient is not nervous/anxious.     PMFS History:  Patient Active Problem List   Diagnosis Date Noted   History of pulmonary embolism 54/65/6812   Chronic systolic heart failure ([place]) 11/17/2021   Acute bronchitis  about 3 months ago.  He continues to have migratory arthralgias and intermittent inflammation involving multiple joints.  He completed a prednisone taper about 2 weeks ago which resolved the inflammation in his left wrist.  On examination he continues to have limited extension of the left wrist with some tenderness and thickening.  He also has tenderness over the right ankle joint.  History of left Achilles tendinitis.  No history of plantar fasciitis.  No SI joint tenderness upon palpation today. Two large nodules on extensor surface of the left elbow consistent with possible tophi vs. Rheumatoid nodule.  Patient reports a history of positive rheumatoid factor-no confirmed diagnosis of RA.  No history of DMARD or biologic use in the past.  We will call [facility] rheumatology to obtain records to review. X-rays of both hands, both knees, and both feet were obtained today for further evaluation.  X-rays of both hands and feet were consistent with osteoarthritis and inflammatory arthritis overlap.  Cystic versus erosive changes were noted in the tarsal bones of the right foot.  No known history of psoriasis but he does have some scaling and skin peeling on the plantar aspect of both feet.  In the past he was diagnosed with tinea pedis but is no longer followed by dermatology.  He was encouraged to follow back up with dermatology for confirmation and to rule out plantar pustular psoriasis.  No  fingernail pitting noted on examination today.  No history of uveitis, crohn's disease or ulcerative colitis.  No family history of any rheumatologic conditions.  The following lab work will be obtained today for further evaluation.  He will follow-up in the office in 2 weeks at which time x-ray results and lab results will be discussed with the patient as well as future treatment options.  - Plan: Rheumatoid factor, Cyclic citrul peptide antibody, IgG, Angiotensin converting enzyme, HLA-B27 antigen, Uric acid, Sedimentation rate, C-reactive protein, CBC with Differential/Platelet, COMPLETE METABOLIC PANEL WITH GFR, ANA, CK  High risk medication use -DMARD/biologic naive.  The following lab work will be obtained today in anticipation of initiating therapy for management of inflammatory arthritis. Plan: CBC with Differential/Platelet, COMPLETE METABOLIC PANEL WITH GFR, QuantiFERON-TB Gold Plus, Serum protein electrophoresis with reflex, IgG, IgA, IgM, Hepatitis B surface antigen, Hepatitis C antibody, Hepatitis B core antibody, IgM  Pain in both wrists - Patient has been experiencing chronic pain in both wrist joints.  He has intermittent inflammation which has been responsive to prednisone in the past.  History of positive rheumatoid factor per patient.  We will obtain records from [facility] rheumatology to further review.    He completed a prednisone taper 2 weeks ago which resolved the pain and inflammation in his left wrist.  He has slightly limited extension of the left wrist with mild tenderness and thickening noted on examination today.  The right wrist has good range of motion with no tenderness or synovitis.  X-rays of both hands were obtained today for further evaluation: X-rays of the left wrist revealed radiocarpal joint space narrowing.  He also had mild radiocarpal joint space narrowing of the right wrist joint.  No erosive changes were noted.  X-ray findings were consistent with osteoarthritis  and inflammatory arthritis overlap.  The following lab work will be obtained today for further evaluation.  Plan: XR Hand 2 View Right, XR Hand 2 View Left  Pain in left elbow: He has some tenderness along the left elbow joint line.  2 large nodules noted on the extensor surface  of the left elbow consistent with a possible tophi.  A rheumatoid nodule cannot be ruled out currently.  Chronic pain of both knees -History of recurrent knee effusions requiring aspirations.  No history of injury or arthroscopic surgery.  Under care of Dr. Aundria Rud.   X-rays of both knees were updated today: Left knee consistent with moderate osteoarthritis and mild chondromalacia patella and right knee is consistent with mild chondromalacia patella.  No warmth or effusion noted on examination today.  Patient completed a prednisone taper 2 weeks ago.   The following lab work will be obtained today for further evaluation.  Plan: XR KNEE 3 VIEW RIGHT, XR KNEE 3 VIEW LEFT  Idiopathic chronic gout of multiple sites without tophus - Previously diagnosed with gout by [facility] rheumatology.  Patient took Uloric 40 mg daily and colchicine 0.6 mg twice daily as needed during flares for about 6 months but had no improvement in his symptoms.  He discontinued both medications about 3 months ago.  On examination possible tophi was noted on the extensor surface of the left elbow.  Uric acid level will be checked today. - Plan: Uric acid  Pain in both feet -Patient has had chronic pain in bilateral ankle joints with intermittent inflammation over the past several years.  On examination he has no tenderness or synovitis over the MTP joints.  He has some PIP and DIP thickening on examination.  No dactylitis noted.  No evidence of Achilles tendinitis or plantar fasciitis.  X-rays of both feet were obtained today for further evaluation : Findings are consistent with osteoarthritis and possible inflammatory arthritis.  Cystic changes were noted in the  metatarsal heads.   Plan: XR Foot 2 Views Left, XR Foot 2 Views Right  Chronic pain of both ankles: Patient has had intermittent discomfort and inflammation in both ankle joints off and on since 2015.  On examination he has tenderness over the right ankle joint.  He completed a prednisone taper about 2 weeks ago.  He had updated x-rays of both ankles in late fall 2023 ordered by Dr. Aundria Rud.  X-rays of both feet were obtained today for further evaluation.  Intertarsal narrowing noted bilaterally.  Cystic versus erosive changes noted in the tarsal bones of the right foot.  Findings were consistent with osteoarthritis and inflammatory arthritis overlap.  Achilles tendinitis of left lower extremity: Not currently symptomatic.  Previously diagnosed with Achilles tendinitis of the left ankle.  No tenderness or inflammation noted on examination today.  No history of plantar fasciitis.  Other fatigue - The following lab work will be obtained today for further evaluation.  Plan: CBC with Differential/Platelet, COMPLETE METABOLIC PANEL WITH GFR, ANA, CK  Other medical conditions are listed as follows:   Chronic systolic heart failure (HCC)  Dilated cardiomyopathy (HCC)  Essential hypertension: Followed by cardiology.  Blood pressure was elevated at 147/103 today in the office.  Blood pressure was rechecked prior to him leaving.  He was advised to monitor his blood pressure closely.  History of pulmonary embolism: Patient remains on Eliquis as prescribed.  Followed by cardiology.  History of hyperlipidemia  Hypogonadism male  Depression, major, single episode, moderate (HCC): Patient is not currently having any episodic depression.  He is no longer taking Zoloft.          Orders: Orders Placed This Encounter  Procedures   XR Hand 2 View Right   XR Hand 2 View Left   XR KNEE 3 VIEW RIGHT   XR KNEE  3 VIEW LEFT   XR Foot 2 Views Left   XR Foot 2 Views Right   Rheumatoid factor   Cyclic  citrul peptide antibody, IgG   Angiotensin converting enzyme   HLA-B27 antigen   Uric acid   Sedimentation rate   C-reactive protein   CBC with Differential/Platelet   COMPLETE METABOLIC PANEL WITH GFR   ANA   CK   QuantiFERON-TB Gold Plus   Serum protein electrophoresis with reflex   IgG, IgA, IgM   Hepatitis B surface antigen   Hepatitis C antibody   Hepatitis B core antibody, IgM   No orders of the defined types were placed in this encounter.    Follow-Up Instructions: Return for NPFU, Crohn's disease.   Gearldine Bienenstock, PA-C  Note - This record has been created using Dragon software.  Chart creation errors have been sought, but may not always  have been located. Such creation errors do not reflect on  the standard of medical care.

## 2022-02-16 ENCOUNTER — Other Ambulatory Visit: Payer: BC Managed Care – PPO

## 2022-02-23 ENCOUNTER — Ambulatory Visit: Payer: BC Managed Care – PPO

## 2022-02-23 ENCOUNTER — Ambulatory Visit: Payer: BC Managed Care – PPO | Attending: Physician Assistant | Admitting: Physician Assistant

## 2022-02-23 ENCOUNTER — Ambulatory Visit (INDEPENDENT_AMBULATORY_CARE_PROVIDER_SITE_OTHER): Payer: BC Managed Care – PPO

## 2022-02-23 ENCOUNTER — Encounter: Payer: Self-pay | Admitting: Physician Assistant

## 2022-02-23 VITALS — BP 147/103 | HR 78 | Resp 16 | Ht 74.0 in | Wt 261.0 lb

## 2022-02-23 DIAGNOSIS — M25461 Effusion, right knee: Secondary | ICD-10-CM

## 2022-02-23 DIAGNOSIS — M79671 Pain in right foot: Secondary | ICD-10-CM | POA: Diagnosis not present

## 2022-02-23 DIAGNOSIS — M79672 Pain in left foot: Secondary | ICD-10-CM | POA: Diagnosis not present

## 2022-02-23 DIAGNOSIS — Z8639 Personal history of other endocrine, nutritional and metabolic disease: Secondary | ICD-10-CM

## 2022-02-23 DIAGNOSIS — M25532 Pain in left wrist: Secondary | ICD-10-CM | POA: Diagnosis not present

## 2022-02-23 DIAGNOSIS — M25562 Pain in left knee: Secondary | ICD-10-CM

## 2022-02-23 DIAGNOSIS — Z79899 Other long term (current) drug therapy: Secondary | ICD-10-CM | POA: Diagnosis not present

## 2022-02-23 DIAGNOSIS — E291 Testicular hypofunction: Secondary | ICD-10-CM

## 2022-02-23 DIAGNOSIS — F321 Major depressive disorder, single episode, moderate: Secondary | ICD-10-CM

## 2022-02-23 DIAGNOSIS — I10 Essential (primary) hypertension: Secondary | ICD-10-CM

## 2022-02-23 DIAGNOSIS — M25561 Pain in right knee: Secondary | ICD-10-CM | POA: Diagnosis not present

## 2022-02-23 DIAGNOSIS — G8929 Other chronic pain: Secondary | ICD-10-CM | POA: Diagnosis not present

## 2022-02-23 DIAGNOSIS — M25571 Pain in right ankle and joints of right foot: Secondary | ICD-10-CM

## 2022-02-23 DIAGNOSIS — M7662 Achilles tendinitis, left leg: Secondary | ICD-10-CM

## 2022-02-23 DIAGNOSIS — M25531 Pain in right wrist: Secondary | ICD-10-CM

## 2022-02-23 DIAGNOSIS — M1A09X0 Idiopathic chronic gout, multiple sites, without tophus (tophi): Secondary | ICD-10-CM | POA: Diagnosis not present

## 2022-02-23 DIAGNOSIS — I420 Dilated cardiomyopathy: Secondary | ICD-10-CM

## 2022-02-23 DIAGNOSIS — M25522 Pain in left elbow: Secondary | ICD-10-CM

## 2022-02-23 DIAGNOSIS — I5022 Chronic systolic (congestive) heart failure: Secondary | ICD-10-CM

## 2022-02-23 DIAGNOSIS — M2550 Pain in unspecified joint: Secondary | ICD-10-CM | POA: Diagnosis not present

## 2022-02-23 DIAGNOSIS — Z86711 Personal history of pulmonary embolism: Secondary | ICD-10-CM

## 2022-02-23 DIAGNOSIS — R5383 Other fatigue: Secondary | ICD-10-CM

## 2022-02-23 DIAGNOSIS — M25572 Pain in left ankle and joints of left foot: Secondary | ICD-10-CM

## 2022-02-26 LAB — IGG, IGA, IGM
IgG (Immunoglobin G), Serum: 1199 mg/dL (ref 600–1640)
IgM, Serum: 251 mg/dL (ref 50–300)
Immunoglobulin A: 162 mg/dL (ref 47–310)

## 2022-02-26 LAB — PROTEIN ELECTROPHORESIS, SERUM, WITH REFLEX
Albumin ELP: 4.8 g/dL (ref 3.8–4.8)
Alpha 1: 0.3 g/dL (ref 0.2–0.3)
Alpha 2: 0.7 g/dL (ref 0.5–0.9)
Beta 2: 0.4 g/dL (ref 0.2–0.5)
Beta Globulin: 0.5 g/dL (ref 0.4–0.6)
Gamma Globulin: 1.2 g/dL (ref 0.8–1.7)
Total Protein: 7.8 g/dL (ref 6.1–8.1)

## 2022-02-26 LAB — COMPLETE METABOLIC PANEL WITH GFR
AG Ratio: 1.6 (calc) (ref 1.0–2.5)
ALT: 37 U/L (ref 9–46)
AST: 29 U/L (ref 10–40)
Albumin: 4.9 g/dL (ref 3.6–5.1)
Alkaline phosphatase (APISO): 49 U/L (ref 36–130)
BUN: 13 mg/dL (ref 7–25)
CO2: 18 mmol/L — ABNORMAL LOW (ref 20–32)
Calcium: 9.5 mg/dL (ref 8.6–10.3)
Chloride: 102 mmol/L (ref 98–110)
Creat: 0.92 mg/dL (ref 0.60–1.29)
Globulin: 3 g/dL (calc) (ref 1.9–3.7)
Glucose, Bld: 113 mg/dL — ABNORMAL HIGH (ref 65–99)
Potassium: 4.4 mmol/L (ref 3.5–5.3)
Sodium: 138 mmol/L (ref 135–146)
Total Bilirubin: 0.7 mg/dL (ref 0.2–1.2)
Total Protein: 7.9 g/dL (ref 6.1–8.1)
eGFR: 104 mL/min/{1.73_m2} (ref >=60)

## 2022-02-26 LAB — CBC WITH DIFFERENTIAL/PLATELET
Absolute Monocytes: 549 cells/uL (ref 200–950)
Basophils Absolute: 74 cells/uL (ref 0–200)
Basophils Relative: 0.9 %
Eosinophils Absolute: 254 cells/uL (ref 15–500)
Eosinophils Relative: 3.1 %
HCT: 46.2 % (ref 38.5–50.0)
Hemoglobin: 16.3 g/dL (ref 13.2–17.1)
Lymphs Abs: 2731 cells/uL (ref 850–3900)
MCH: 33.4 pg — ABNORMAL HIGH (ref 27.0–33.0)
MCHC: 35.3 g/dL (ref 32.0–36.0)
MCV: 94.7 fL (ref 80.0–100.0)
MPV: 9.5 fL (ref 7.5–12.5)
Monocytes Relative: 6.7 %
Neutro Abs: 4592 cells/uL (ref 1500–7800)
Neutrophils Relative %: 56 %
Platelets: 270 10*3/uL (ref 140–400)
RBC: 4.88 10*6/uL (ref 4.20–5.80)
RDW: 14.3 % (ref 11.0–15.0)
Total Lymphocyte: 33.3 %
WBC: 8.2 10*3/uL (ref 3.8–10.8)

## 2022-02-26 LAB — QUANTIFERON-TB GOLD PLUS
Mitogen-NIL: >10 IU/mL
NIL: 0.07 IU/mL
QuantiFERON-TB Gold Plus: NEGATIVE
TB1-NIL: 0 IU/mL
TB2-NIL: 0 IU/mL

## 2022-02-26 LAB — URIC ACID: Uric Acid, Serum: 7.6 mg/dL (ref 4.0–8.0)

## 2022-02-26 LAB — ANA: Anti Nuclear Antibody (ANA): POSITIVE — AB

## 2022-02-26 LAB — CYCLIC CITRUL PEPTIDE ANTIBODY, IGG: Cyclic Citrullin Peptide Ab: <16 UNITS

## 2022-02-26 LAB — RHEUMATOID FACTOR: Rheumatoid fact SerPl-aCnc: <14 IU/mL (ref <14)

## 2022-02-26 LAB — ANTI-NUCLEAR AB-TITER (ANA TITER): ANA Titer 1: 1:80 {titer} — ABNORMAL HIGH

## 2022-02-26 LAB — HEPATITIS C ANTIBODY: Hepatitis C Ab: NONREACTIVE

## 2022-02-26 LAB — C-REACTIVE PROTEIN: CRP: 3.4 mg/L (ref <8.0)

## 2022-02-26 LAB — HLA-B27 ANTIGEN: HLA-B27 Antigen: NEGATIVE

## 2022-02-26 LAB — ANGIOTENSIN CONVERTING ENZYME: Angiotensin-Converting Enzyme: 35 U/L (ref 9–67)

## 2022-02-26 LAB — HEPATITIS B CORE ANTIBODY, IGM: Hep B C IgM: NONREACTIVE

## 2022-02-26 LAB — HEPATITIS B SURFACE ANTIGEN: Hepatitis B Surface Ag: NONREACTIVE

## 2022-02-26 LAB — SEDIMENTATION RATE: Sed Rate: 14 mm/h (ref 0–15)

## 2022-02-26 LAB — CK: Total CK: 66 U/L (ref 44–196)

## 2022-03-01 ENCOUNTER — Ambulatory Visit: Payer: BC Managed Care – PPO

## 2022-03-01 DIAGNOSIS — I10 Essential (primary) hypertension: Secondary | ICD-10-CM | POA: Diagnosis not present

## 2022-03-01 DIAGNOSIS — Z86711 Personal history of pulmonary embolism: Secondary | ICD-10-CM

## 2022-03-01 DIAGNOSIS — I5022 Chronic systolic (congestive) heart failure: Secondary | ICD-10-CM

## 2022-03-01 NOTE — Progress Notes (Signed)
Office Visit Note  Patient: Ryan Gibson             Date of Birth: 1976/01/03           MRN: [id number]             PCP: Tommie Sams, DO Referring: Tommie Sams, DO Visit Date: 03/14/2022 Occupation: @GUAROCC @  Subjective:  Pain in multiple joints  History of Present Illness: Ryan Gibson is a 47 y.o. male with history of gouty arthropathy.  He was diagnosed with the with gout in 2019 by [facility] rheumatology.  He was treated there between 2021 through 2022.  He had crystal proven gout.  He also had episodic flares for 6 years prior to seeing a rheumatologist.  Allopurinol was discontinued due to intolerance and elevated LFTs.  He was switched to Uloric which she took intermittently and stopped during the flares.  He states he had persistent joint inflammation and pain.  He gives history of pain in his wrist joints, knees and his ankles.  He also has intermittent Achilles tendinitis.  He is currently not taking Uloric or colchicine.  Activities of Daily Living:  Patient reports morning stiffness for 2 hours.   Patient Denies nocturnal pain.  Difficulty dressing/grooming: Denies Difficulty climbing stairs: Reports Difficulty getting out of chair: Denies Difficulty using hands for taps, buttons, cutlery, and/or writing: Denies  Review of Systems  Constitutional:  Positive for fatigue.  HENT:  Negative for mouth sores and mouth dryness.   Eyes:  Negative for dryness.  Respiratory:  Negative for shortness of breath.   Cardiovascular:  Negative for chest pain and palpitations.  Gastrointestinal:  Negative for blood in stool, constipation and diarrhea.  Endocrine: Negative for increased urination.  Genitourinary:  Negative for involuntary urination.  Musculoskeletal:  Positive for joint pain, gait problem, joint pain, joint swelling, muscle weakness and morning stiffness. Negative for myalgias, muscle tenderness and myalgias.  Skin:  Negative for color change, rash,  hair loss and sensitivity to sunlight.  Allergic/Immunologic: Negative for susceptible to infections.  Neurological:  Negative for dizziness and headaches.  Hematological:  Negative for swollen glands.  Psychiatric/Behavioral:  Negative for depressed mood and sleep disturbance. The patient is not nervous/anxious.     PMFS History:  Patient Active Problem List   Diagnosis Date Noted   History of pulmonary embolism 11/17/2021   Chronic systolic heart failure (HCC) 11/17/2021   Acute bronchitis 09/29/2021   Hyperlipidemia 06/16/2021   Dilated cardiomyopathy (HCC) 12/16/2020   Systolic CHF (HCC) 12/16/2020   Family history of early CAD 12/16/2020   Pulmonary embolism (HCC) 02/16/2020   Depression, major, single episode, moderate (HCC) 03/07/2018   Hypogonadism male 11/24/2015   Gout 11/01/2014   Essential hypertension 03/10/2014    Past Medical History:  Diagnosis Date   Asthma    AS A CHILD ONLY   Foot pain, right    painful x 1 month and swelling    Family History  Problem Relation Age of Onset   Healthy Mother    Heart disease Father    Past Surgical History:  Procedure Laterality Date   HERNIA REPAIR  07/13/2011   [facility]   INGUINAL HERNIA REPAIR  07/13/2011   Procedure: HERNIA REPAIR INGUINAL ADULT;  Surgeon: Wilmon Arms. Corliss Skains, MD;  Location: [facility] OR[facility];  Service: General;  Laterality: Right;   NO PREVIOUS SURGERY     Social History   Social History Narrative   Married for 13 years.Lives with  wife and kids.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   Immunization History  Administered Date(s) Administered   Tdap 03/07/2018     Objective: Vital Signs: BP (!) 131/92 (BP Location: Left Arm, Patient Position: Sitting, Cuff Size: Large)   Pulse 89   Resp 18   Ht 6\' 2"  (1.88 m)   Wt 263 lb 12.8 oz (119.7 kg)   BMI 33.87 kg/m    Physical Exam Vitals and nursing note reviewed.  Constitutional:      Appearance: He is well-developed.  HENT:     Head: Normocephalic and  atraumatic.  Eyes:     Conjunctiva/sclera: Conjunctivae normal.     Pupils: Pupils are equal, round, and reactive to light.  Cardiovascular:     Rate and Rhythm: Normal rate and regular rhythm.     Heart sounds: Normal heart sounds.  Pulmonary:     Effort: Pulmonary effort is normal.     Breath sounds: Normal breath sounds.  Abdominal:     General: Bowel sounds are normal.     Palpations: Abdomen is soft.  Musculoskeletal:     Cervical back: Normal range of motion and neck supple.  Skin:    General: Skin is warm and dry.     Capillary Refill: Capillary refill takes less than 2 seconds.  Neurological:     Mental Status: He is alert and oriented to person, place, and time.  Psychiatric:        Behavior: Behavior normal.      Musculoskeletal Exam: Cervical, thoracic and lumbar spine were in good range of motion.  Shoulder joints, elbow joints, wrist joints with good range of motion.  He had left olecranon bursa enlargement and possible tophus.  Wrist joints, MCPs PIPs and DIPs were in full range of motion with no synovitis.  Hip joints and knee joints in good range of motion with no tenderness.  There was no tenderness over ankle empties.  CDAI Exam: CDAI Score: -- Patient Global: --; Provider Global: -- Swollen: --; Tender: -- Joint Exam 03/14/2022   No joint exam has been documented for this visit   There is currently no information documented on the homunculus. Go to the Rheumatology activity and complete the homunculus joint exam.  Investigation: No additional findings.  Imaging: PCV ECHOCARDIOGRAM COMPLETE  Result Date: 03/01/2022 Echocardiogram 03/01/2022: Left ventricle cavity is mildly dilated. Normal left ventricular wall thickness. Hypokinetic global wall motion. Abnormal septal wall motion due to left bundle branch block. Normal diastolic filling pattern. Moderately depressed LV systolic function with visual EF 35-40%. Calculated EF 43%. The aortic root is mildly  dilated at 4.2 cm at sinus of valsalva. Ascending aorta is normal in size. . Compared to the study done on 07/13/2021, no significant change, mild aortic root dilatation at sinus of Valsalva is new.   XR Foot 2 Views Left  Result Date: 02/23/2022 First and second MTP narrowing was noted.  PIP and DIP narrowing was noted.  Intertarsal narrowing and dorsal spurring was noted.  Posterior talar spur was noted.  Posterior calcaneal spur was noted.  Cystic changes were noted in the metatarsal heads. Impression: These findings are consistent with osteoarthritis and possible inflammatory arthritis.  XR Foot 2 Views Right  Result Date: 02/23/2022 First MTP, PIP and DIP narrowing was noted.  Cystic changes were noted in the metatarsal heads.  Intertarsal narrowing with cystic versus erosive changes were noted in the tarsal bones.  Tibiotalar and subtalar joint space narrowing with spurring was noted.  Posterior  calcaneal spur was noted. Impression: These findings are consistent with osteoarthritis and inflammatory arthritis overlap.  XR KNEE 3 VIEW LEFT  Result Date: 02/23/2022 Moderate medial compartment narrowing was noted.  Mild patellofemoral narrowing was noted.  No chondrocalcinosis was noted. Impression: These findings are consistent with moderate osteoarthritis and mild chondromalacia patella.  XR KNEE 3 VIEW RIGHT  Result Date: 02/23/2022 No medial or lateral compartment narrowing was noted.  Mild patellofemoral narrowing was noted.  No chondrocalcinosis was noted. Impression: These findings are consistent with chondromalacia patella of the knee.  XR Hand 2 View Right  Result Date: 02/23/2022 No MCP PIP or DIP narrowing was noted.  No intercarpal joint space narrowing was noted.  Mild radiocarpal joint space narrowing was noted.  No erosive changes were noted. Impression:These findings are consistent with osteoarthritis and inflammatory arthritis overlap.  XR Hand 2 View Left  Result Date:  02/23/2022 [facility] and PIP narrowing was noted.  No DIP, MCP,or intercarpal joint space narrowing was noted.  Radiocarpal joint space narrowing was noted. Impression: These findings are consistent with osteoarthritis and inflammatory arthritis overlap.   Recent Labs: Lab Results  Component Value Date   WBC 8.2 02/23/2022   HGB 16.3 02/23/2022   PLT 270 02/23/2022   NA 138 02/23/2022   K 4.4 02/23/2022   CL 102 02/23/2022   CO2 18 (L) 02/23/2022   GLUCOSE 113 (H) 02/23/2022   BUN 13 02/23/2022   CREATININE 0.92 02/23/2022   BILITOT 0.7 02/23/2022   ALKPHOS 46 12/30/2020   AST 29 02/23/2022   ALT 37 02/23/2022   PROT 7.9 02/23/2022   PROT 7.8 02/23/2022   ALBUMIN 4.7 12/30/2020   CALCIUM 9.5 02/23/2022   GFRAA 96 02/10/2020   QFTBGOLDPLUS NEGATIVE 02/23/2022   February 23, 2022 ANA 1: 80NS, RF negative, anti-CCP negative, ACE 35, HLA-B27 negative, uric acid 7.6, ESR 14, CRP 3.4, CK 66, hepatitis B-, hepatitis C negative, immunoglobulins normal, SPEP normal, TB Gold negative  Speciality Comments: No specialty comments available.  Procedures:  No procedures performed Allergies: Vancomycin   Assessment / Plan:     Visit Diagnoses: Polyarthralgia - History of recurrent joint discomfort involving wrists, knees and ankles since 2015.  The flares respond to prednisone per patient.  Patient states that he has been taking Uloric and colchicine intermittently and discontinued the medication several months ago as he was still having flares.  He states he stopped colchicine and Uloric when he had flares.  Besides the left olecranon bursa tenderness I did not see synovitis in any other joints today.  Idiopathic chronic gout of multiple sites without tophus -he has crystal proven gout diagnosed at [facility] rheumatology.  He was treated with allopurinol initially which was discontinued due to GI side effects.  He also states that allopurinol did not control his symptoms.  He was on Uloric 40 mg and  was tried on 80 mg per patient.  He states he still had symptoms while he was on Uloric.  I am uncertain if Uloric was taken without interruption.  As he mentions stopping Uloric for flares.  Detailed counsel regarding gouty arthropathy was provided.  Getting uric acid level below 6 ideally around 4 was advised.  Patient voiced understanding.  Indications side effects contraindications of Uloric and colchicine were discussed at length.  Will start him on colchicine 0.6 mg p.o. daily and Uloric 40 mg p.o. daily.  After 2 weeks he will increase Uloric dose to 80 mg p.o. daily.  If he  develops any flares he will notify us.  Patient admits consuming red meat and drinks alcohol.  Abstinence from alcohol, avoiding red meat and shellfish was emphasized.  Dietary modifications were discussed.  He was advised to get labs in 1 month.  Then we will monitor labs every 3 months until stable.  Pain in left elbow - Phe had olecranon bursa enlargement and tophi palpable over the left elbow.  Pain in both wrists - History of intermittent pain and swelling of bilateral wrists.  X-rays showed osteoarthritis and inflammatory arthritis overlap.  He had no synovitis on examination.  Effusion, right knee - History of recurrent knee joint effusion.  No effusion was noted today.  Aspiration in the past showed MSU crystals per patient.  Chronic pain of left knee -no synovitis was noted today.  Left knee joint x-rays showed moderate osteoarthritis and mild chondromalacia patella.  Right knee joint x-rays showed mild chondromalacia patella.  X-rays were reviewed with the patient and x-ray findings were discussed.  Chronic pain of both ankles-he gives history of pain in the ankles and midfoot.  No synovitis was noted.  Pain in both feet - X-rays consistent with osteoarthritis and inflammatory arthritis overlap.  X-ray findings were discussed with the patient.  Achilles tendinitis of left lower extremity - In the past.  Currently  asymptomatic.  Positive ANA-ANA is low titer positive and most likely not significant.  He has no history of oral ulcers neck, nasal ulcers, malar rash, photosensitivity, Raynaud's phenomenon or lymphadenopathy.  I will check ANA and complements with the next labs.  Tinea pedis of both feet - Diagnosed by dermatology in the past.  Onychomycosis  History of pulmonary embolism - He is on Eliquis by cardiology.  Chronic systolic heart failure (HCC)  Dilated cardiomyopathy (HCC)  Essential hypertension  History of hyperlipidemia  Depression, major, single episode, moderate (HCC)  Hypogonadism male  Orders: Orders Placed This Encounter  Procedures   CBC with Differential/Platelet   COMPLETE METABOLIC PANEL WITH GFR   Uric acid   RNP Antibody   Anti-Smith antibody   Sjogrens syndrome-A extractable nuclear antibody   Sjogrens syndrome-B extractable nuclear antibody   Anti-DNA antibody, double-stranded   C3 and C4   Meds ordered this encounter  Medications   colchicine 0.6 MG tablet    Sig: Take 1 tablet (0.6 mg total) by mouth daily.    Dispense:  90 tablet    Refill:  0   febuxostat (ULORIC) 40 MG tablet    Sig: Take one tab by mouth daily for 2 weeks. Then increase to 2 tabs by mouth daily thereafter    Dispense:  46 tablet    Refill:  0    Follow-Up Instructions: Return in about 6 weeks (around 04/25/2022) for Gout.   Pollyann Savoy, MD  Note - This record has been created using Animal nutritionist.  Chart creation errors have been sought, but may not always  have been located. Such creation errors do not reflect on  the standard of medical care.

## 2022-03-14 ENCOUNTER — Encounter: Payer: Self-pay | Admitting: Rheumatology

## 2022-03-14 ENCOUNTER — Ambulatory Visit: Payer: BC Managed Care – PPO | Attending: Rheumatology | Admitting: Rheumatology

## 2022-03-14 ENCOUNTER — Telehealth: Payer: Self-pay

## 2022-03-14 VITALS — BP 131/92 | HR 89 | Resp 18 | Ht 74.0 in | Wt 263.8 lb

## 2022-03-14 DIAGNOSIS — Z86711 Personal history of pulmonary embolism: Secondary | ICD-10-CM

## 2022-03-14 DIAGNOSIS — M25531 Pain in right wrist: Secondary | ICD-10-CM | POA: Diagnosis not present

## 2022-03-14 DIAGNOSIS — Z79899 Other long term (current) drug therapy: Secondary | ICD-10-CM

## 2022-03-14 DIAGNOSIS — M25461 Effusion, right knee: Secondary | ICD-10-CM

## 2022-03-14 DIAGNOSIS — I5022 Chronic systolic (congestive) heart failure: Secondary | ICD-10-CM

## 2022-03-14 DIAGNOSIS — M25572 Pain in left ankle and joints of left foot: Secondary | ICD-10-CM

## 2022-03-14 DIAGNOSIS — M2550 Pain in unspecified joint: Secondary | ICD-10-CM

## 2022-03-14 DIAGNOSIS — F321 Major depressive disorder, single episode, moderate: Secondary | ICD-10-CM

## 2022-03-14 DIAGNOSIS — M25571 Pain in right ankle and joints of right foot: Secondary | ICD-10-CM

## 2022-03-14 DIAGNOSIS — R768 Other specified abnormal immunological findings in serum: Secondary | ICD-10-CM

## 2022-03-14 DIAGNOSIS — M79671 Pain in right foot: Secondary | ICD-10-CM

## 2022-03-14 DIAGNOSIS — M25562 Pain in left knee: Secondary | ICD-10-CM

## 2022-03-14 DIAGNOSIS — B351 Tinea unguium: Secondary | ICD-10-CM

## 2022-03-14 DIAGNOSIS — M25532 Pain in left wrist: Secondary | ICD-10-CM

## 2022-03-14 DIAGNOSIS — M1A09X0 Idiopathic chronic gout, multiple sites, without tophus (tophi): Secondary | ICD-10-CM

## 2022-03-14 DIAGNOSIS — R7689 Other specified abnormal immunological findings in serum: Secondary | ICD-10-CM

## 2022-03-14 DIAGNOSIS — I10 Essential (primary) hypertension: Secondary | ICD-10-CM

## 2022-03-14 DIAGNOSIS — B353 Tinea pedis: Secondary | ICD-10-CM

## 2022-03-14 DIAGNOSIS — E291 Testicular hypofunction: Secondary | ICD-10-CM

## 2022-03-14 DIAGNOSIS — G8929 Other chronic pain: Secondary | ICD-10-CM

## 2022-03-14 DIAGNOSIS — Z8639 Personal history of other endocrine, nutritional and metabolic disease: Secondary | ICD-10-CM

## 2022-03-14 DIAGNOSIS — I420 Dilated cardiomyopathy: Secondary | ICD-10-CM

## 2022-03-14 DIAGNOSIS — M25522 Pain in left elbow: Secondary | ICD-10-CM

## 2022-03-14 DIAGNOSIS — M7662 Achilles tendinitis, left leg: Secondary | ICD-10-CM

## 2022-03-14 DIAGNOSIS — M79672 Pain in left foot: Secondary | ICD-10-CM

## 2022-03-14 MED ORDER — COLCHICINE 0.6 MG PO TABS: 0.6000 mg | ORAL_TABLET | Freq: Every day | ORAL | 0 refills | Status: DC

## 2022-03-14 MED ORDER — FEBUXOSTAT 40 MG PO TABS: ORAL_TABLET | ORAL | 0 refills | Status: DC

## 2022-03-14 NOTE — Telephone Encounter (Signed)
Patient returned the call and appointment has now been rescheduled. Also advised patient he is due for labs in 1 month. Patient will plan to get labs drawn at [facility] in [place], he will call prior to going for orders to be released.

## 2022-03-14 NOTE — Addendum Note (Signed)
Addended by: Earnestine Mealing on: 03/14/2022 03:36 PM   Modules accepted: Orders

## 2022-03-14 NOTE — Patient Instructions (Addendum)
Restart colchicine 0.6mg  daily  Restart febuxostat 40mg  daily for 2 weeks THEN increase to 80mg  daily  Please return for labs in 1 month.   Gout  Gout is a condition that causes painful swelling of the joints. Gout is a type of inflammation of the joints (arthritis). This condition is caused by having too much uric acid in the body. Uric acid is a chemical that forms when the body breaks down substances called purines. Purines are important for building body proteins. When the body has too much uric acid, sharp crystals can form and build up inside the joints. This causes pain and swelling. Gout attacks can happen quickly and may be very painful (acute gout). Over time, the attacks can affect more joints and become more frequent (chronic gout). Gout can also cause uric acid to build up under the skin and inside the kidneys. What are the causes? This condition is caused by too much uric acid in your blood. This can happen because: Your kidneys do not remove enough uric acid from your blood. This is the most common cause. Your body makes too much uric acid. This can happen with some cancers and cancer treatments. It can also occur if your body is breaking down too many red blood cells (hemolytic anemia). You eat too many foods that are high in purines. These foods include organ meats and some seafood. Alcohol, especially beer, is also high in purines. A gout attack may be triggered by trauma or stress. What increases the risk? The following factors may make you more likely to develop this condition: Having a family history of gout. Being male and middle-aged. Being male and having gone through menopause. Taking certain medicines, including aspirin, cyclosporine, diuretics, levodopa, and niacin. Having an organ transplant. Having certain conditions, such as: Being obese. Lead poisoning. Kidney disease. A skin condition called psoriasis. Other factors include: Losing weight too  quickly. Being dehydrated. Frequently drinking alcohol, especially beer. Frequently drinking beverages that are sweetened with a type of sugar called fructose. What are the signs or symptoms? An attack of acute gout happens quickly. It usually occurs in just one joint. The most common place is the big toe. Attacks often start at night. Other joints that may be affected include joints of the feet, ankle, knee, fingers, wrist, or elbow. Symptoms of this condition may include: Severe pain. Warmth. Swelling. Stiffness. Tenderness. The affected joint may be very painful to touch. Shiny, red, or purple skin. Chills and fever. Chronic gout may cause symptoms more frequently. More joints may be involved. You may also have white or yellow lumps (tophi) on your hands or feet or in other areas near your joints. How is this diagnosed? This condition is diagnosed based on your symptoms, your medical history, and a physical exam. You may have tests, such as: Blood tests to measure uric acid levels. Removal of joint fluid with a thin needle (aspiration) to look for uric acid crystals. X-rays to look for joint damage. How is this treated? Treatment for this condition has two phases: treating an acute attack and preventing future attacks. Acute gout treatment may include medicines to reduce pain and swelling, including: NSAIDs, such as ibuprofen. Steroids. These are strong anti-inflammatory medicines that can be taken by mouth (orally) or injected into a joint. Colchicine. This medicine relieves pain and swelling when it is taken soon after an attack. It can be given by mouth or through an IV. Preventive treatment may include: Daily use of smaller doses of  NSAIDs or colchicine. Use of a medicine that reduces uric acid levels in your blood, such as allopurinol. Changes to your diet. You may need to see a dietitian about what to eat and drink to prevent gout. Follow these instructions at home: During a  gout attack  If directed, put ice on the affected area. To do this: Put ice in a plastic bag. Place a towel between your skin and the bag. Leave the ice on for 20 minutes, 2-3 times a day. Remove the ice if your skin turns bright red. This is very important. If you cannot feel pain, heat, or cold, you have a greater risk of damage to the area. Raise (elevate) the affected joint above the level of your heart as often as possible. Rest the joint as much as possible. If the affected joint is in your leg, you may be given crutches to use. Follow instructions from your health care provider about eating or drinking restrictions. Avoiding future gout attacks Follow a low-purine diet as told by your dietitian or health care provider. Avoid foods and drinks that are high in purines, including liver, kidney, anchovies, asparagus, herring, mushrooms, mussels, and beer. Maintain a healthy weight or lose weight if you are overweight. If you want to lose weight, talk with your health care provider. Do not lose weight too quickly. Start or maintain an exercise program as told by your health care provider. Eating and drinking Avoid drinking beverages that contain fructose. Drink enough fluids to keep your urine pale yellow. If you drink alcohol: Limit how much you have to: 0-1 drink a day for women who are not pregnant. 0-2 drinks a day for men. Know how much alcohol is in a drink. In the [place] one drink equals one 12 oz bottle of beer (355 mL), one 5 oz glass of wine (148 mL), or one 1 oz glass of hard liquor (44 mL). General instructions Take over-the-counter and prescription medicines only as told by your health care provider. Ask your health care provider if the medicine prescribed to you requires you to avoid driving or using machinery. Return to your normal activities as told by your health care provider. Ask your health care provider what activities are safe for you. Keep all follow-up visits. This  is important. Where to find more information [employer]: [email or web address].[email or web address]://www.myers.net/ Contact a health care provider if you have: Another gout attack. Continuing symptoms of a gout attack after 10 days of treatment. Side effects from your medicines. Chills or a fever. Burning pain when you urinate. Pain in your lower back or abdomen. Get help right away if you: Have severe or uncontrolled pain. Cannot urinate. Summary Gout is painful swelling of the joints caused by having too much uric acid in the body. The most common site for gout to occur is in the big toe, but it can affect other joints in the body. Medicines and dietary changes can help to prevent and treat gout attacks. This information is not intended to replace advice given to you by your health care provider. Make sure you discuss any questions you have with your health care provider. Document Revised: 11/11/2020 Document Reviewed: 11/11/2020 Elsevier Patient Education  2023 Elsevier Inc.   Low-Purine Eating Plan A low-purine eating plan involves making food choices to limit your purine intake. Purine is a kind of uric acid. Too much uric acid in your blood can cause certain conditions, such as gout and kidney stones. Eating a low-purine diet may  help control these conditions. What are tips for following this plan? Shopping Avoid buying products that contain high-fructose corn syrup. Check for this on food labels. It is commonly found in many processed foods and soft drinks. Be sure to check for it in baked goods such as cookies, canned fruits, and cereals and cereal bars. Avoid buying veal, chicken breast with skin, lamb, and organ meats such as liver. These types of meats tend to have the highest purine content. Choose dairy products. These may lower uric acid levels. Avoid certain types of fish. Not all fish and seafood have high purine content. Examples with high purine content include anchovies, trout, tuna,  sardines, and salmon. Avoid buying beverages that contain alcohol, particularly beer and hard liquor. Alcohol can affect the way your body gets rid of uric acid. Meal planning  Learn which foods do or do not affect you. If you find out that a food tends to cause your gout symptoms to flare up, avoid eating that food. You can enjoy foods that do not cause problems. If you have any questions about a food item, talk with your dietitian or health care provider. Reduce the overall amount of meat in your diet. When you do eat meat, choose ones with lower purine content. Include plenty of fruits and vegetables. Although some vegetables may have a high purine content--such as asparagus, mushrooms, spinach, or cauliflower--it has been shown that these do not contribute to uric acid blood levels as much. Consume at least 1 dairy serving a day. This has been shown to decrease uric acid levels. General information If you drink alcohol: Limit how much you have to: 0-1 drink a day for women who are not pregnant. 0-2 drinks a day for men. Know how much alcohol is in a drink. In the [place] one drink equals one 12 oz bottle of beer (355 mL), one 5 oz glass of wine (148 mL), or one 1 oz glass of hard liquor (44 mL). Drink plenty of water. Try to drink enough to keep your urine pale yellow. Fluids can help remove uric acid from your body. Work with your health care provider and dietitian to develop a plan to achieve or maintain a healthy weight. Losing weight may help reduce uric acid in your blood. What foods are recommended? The following are some types of foods that are good choices when limiting purine intake: Fresh or frozen fruits and vegetables. Whole grains, breads, cereals, and pasta. Rice. Beans, peas, legumes. Nuts and seeds. Dairy products. Fats and oils. The items listed above may not be a complete list. Talk with a dietitian about what dietary choices are best for you. What foods are not  recommended? Limit your intake of foods high in purines, including: Beer and other alcohol. Meat-based gravy or sauce. Canned or fresh fish, such as: Anchovies, sardines, herring, salmon, and tuna. Mussels and scallops. Codfish, trout, and haddock. Bacon, veal, chicken breast with skin, and lamb. Organ meats, such as: Liver or kidney. Tripe. Sweetbreads (thymus gland or pancreas). Wild Education officer, environmental. Yeast or yeast extract supplements. Drinks sweetened with high-fructose corn syrup, such as soda. Processed foods made with high-fructose corn syrup. The items listed above may not be a complete list of foods and beverages you should limit. Contact a dietitian for more information. Summary Eating a low-purine diet may help control conditions caused by too much uric acid in the body, such as gout or kidney stones. Choose low-purine foods, limit alcohol, and limit high-fructose corn  syrup. You will learn over time which foods do or do not affect you. If you find out that a food tends to cause your gout symptoms to flare up, avoid eating that food. This information is not intended to replace advice given to you by your health care provider. Make sure you discuss any questions you have with your health care provider. Document Revised: 01/21/2021 Document Reviewed: 01/21/2021 Elsevier Patient Education  [employer].

## 2022-03-14 NOTE — Telephone Encounter (Signed)
Patient was seen in the office today for a new patient follow up and Per Dr. Estanislado Pandy, patient should have labs drawn in 1 month (future orders are in place) and follow up in 6 weeks for an appointment. I have called the patient and left a message to advise him we need to reschedule the follow up and he is due for labs in 1 month. Advised patient to call the office.

## 2022-03-28 DIAGNOSIS — E349 Endocrine disorder, unspecified: Secondary | ICD-10-CM | POA: Diagnosis not present

## 2022-03-28 DIAGNOSIS — Z125 Encounter for screening for malignant neoplasm of prostate: Secondary | ICD-10-CM | POA: Diagnosis not present

## 2022-04-08 ENCOUNTER — Other Ambulatory Visit: Payer: Self-pay | Admitting: Rheumatology

## 2022-04-08 DIAGNOSIS — M1A09X0 Idiopathic chronic gout, multiple sites, without tophus (tophi): Secondary | ICD-10-CM

## 2022-04-11 DIAGNOSIS — E349 Endocrine disorder, unspecified: Secondary | ICD-10-CM | POA: Diagnosis not present

## 2022-04-11 DIAGNOSIS — N5201 Erectile dysfunction due to arterial insufficiency: Secondary | ICD-10-CM | POA: Diagnosis not present

## 2022-04-12 ENCOUNTER — Other Ambulatory Visit: Payer: Self-pay | Admitting: Rheumatology

## 2022-04-12 DIAGNOSIS — M1A09X0 Idiopathic chronic gout, multiple sites, without tophus (tophi): Secondary | ICD-10-CM

## 2022-04-12 NOTE — Telephone Encounter (Signed)
Next Visit: 05/02/2022  Last Visit: 03/14/2022  Last Fill: 03/14/2022  DX: Idiopathic chronic gout of multiple sites without tophus   Current Dose per office note 03/14/2022: Uloric dose to 80 mg p.o. daily   Labs: 02/23/2022 uric acid 7.6, Glucose 113, CO2 18, MCH 33.4,   Okay to refill Uloric?

## 2022-04-18 NOTE — Progress Notes (Deleted)
Office Visit Note  Patient: Ryan Gibson             Date of Birth: 1975-09-02           MRN: [id number]             PCP: Tommie Sams, DO Referring: Tommie Sams, DO Visit Date: 05/02/2022 Occupation: @GUAROCC @  Subjective:  No chief complaint on file.   History of Present Illness: Ryan Gibson is a 47 y.o. male ***     Activities of Daily Living:  Patient reports morning stiffness for *** {minute/hour:19697}.   Patient {ACTIONS;DENIES/REPORTS:21021675::"Denies"} nocturnal pain.  Difficulty dressing/grooming: {ACTIONS;DENIES/REPORTS:21021675::"Denies"} Difficulty climbing stairs: {ACTIONS;DENIES/REPORTS:21021675::"Denies"} Difficulty getting out of chair: {ACTIONS;DENIES/REPORTS:21021675::"Denies"} Difficulty using hands for taps, buttons, cutlery, and/or writing: {ACTIONS;DENIES/REPORTS:21021675::"Denies"}  No Rheumatology ROS completed.   PMFS History:  Patient Active Problem List   Diagnosis Date Noted   History of pulmonary embolism 11/17/2021   Chronic systolic heart failure (HCC) 11/17/2021   Acute bronchitis 09/29/2021   Hyperlipidemia 06/16/2021   Dilated cardiomyopathy (HCC) 12/16/2020   Systolic CHF (HCC) 12/16/2020   Family history of early CAD 12/16/2020   Pulmonary embolism (HCC) 02/16/2020   Depression, major, single episode, moderate (HCC) 03/07/2018   Hypogonadism male 11/24/2015   Gout 11/01/2014   Essential hypertension 03/10/2014    Past Medical History:  Diagnosis Date   Asthma    AS A CHILD ONLY   Foot pain, right    painful x 1 month and swelling    Family History  Problem Relation Age of Onset   Healthy Mother    Heart disease Father    Past Surgical History:  Procedure Laterality Date   HERNIA REPAIR  07/13/2011   [facility]   INGUINAL HERNIA REPAIR  07/13/2011   Procedure: HERNIA REPAIR INGUINAL ADULT;  Surgeon: Wilmon Arms. Corliss Skains, MD;  Location: [facility];  Service: General;  Laterality: Right;   NO PREVIOUS SURGERY      Social History   Social History Narrative   Married for 13 years.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   Immunization History  Administered Date(s) Administered   Tdap 03/07/2018     Objective: Vital Signs: There were no vitals taken for this visit.   Physical Exam   Musculoskeletal Exam: ***  CDAI Exam: CDAI Score: -- Patient Global: --; Provider Global: -- Swollen: --; Tender: -- Joint Exam 05/02/2022   No joint exam has been documented for this visit   There is currently no information documented on the homunculus. Go to the Rheumatology activity and complete the homunculus joint exam.  Investigation: No additional findings.  Imaging: No results found.  Recent Labs: Lab Results  Component Value Date   WBC 8.2 02/23/2022   HGB 16.3 02/23/2022   PLT 270 02/23/2022   NA 138 02/23/2022   K 4.4 02/23/2022   CL 102 02/23/2022   CO2 18 (L) 02/23/2022   GLUCOSE 113 (H) 02/23/2022   BUN 13 02/23/2022   CREATININE 0.92 02/23/2022   BILITOT 0.7 02/23/2022   ALKPHOS 46 12/30/2020   AST 29 02/23/2022   ALT 37 02/23/2022   PROT 7.9 02/23/2022   PROT 7.8 02/23/2022   ALBUMIN 4.7 12/30/2020   CALCIUM 9.5 02/23/2022   GFRAA 96 02/10/2020   QFTBGOLDPLUS NEGATIVE 02/23/2022    Speciality Comments: No specialty comments available.  Procedures:  No procedures performed Allergies: Vancomycin   Assessment / Plan:     Visit Diagnoses: No diagnosis found.  Orders: No orders of the defined types were placed in this encounter.  No orders of the defined types were placed in this encounter.   Face-to-face time spent with patient was *** minutes. Greater than 50% of time was spent in counseling and coordination of care.  Follow-Up Instructions: No follow-ups on file.   Pollyann Savoy, MD  Note - This record has been created using Animal nutritionist.  Chart creation errors have been sought, but may not always  have been located.  Such creation errors do not reflect on  the standard of medical care.

## 2022-04-27 ENCOUNTER — Telehealth: Payer: Self-pay | Admitting: Rheumatology

## 2022-04-27 DIAGNOSIS — R768 Other specified abnormal immunological findings in serum: Secondary | ICD-10-CM

## 2022-04-27 DIAGNOSIS — M25531 Pain in right wrist: Secondary | ICD-10-CM

## 2022-04-27 DIAGNOSIS — M1A09X0 Idiopathic chronic gout, multiple sites, without tophus (tophi): Secondary | ICD-10-CM

## 2022-04-27 DIAGNOSIS — M25461 Effusion, right knee: Secondary | ICD-10-CM

## 2022-04-27 DIAGNOSIS — G8929 Other chronic pain: Secondary | ICD-10-CM

## 2022-04-27 DIAGNOSIS — M79671 Pain in right foot: Secondary | ICD-10-CM

## 2022-04-27 DIAGNOSIS — M25522 Pain in left elbow: Secondary | ICD-10-CM

## 2022-04-27 DIAGNOSIS — M2550 Pain in unspecified joint: Secondary | ICD-10-CM

## 2022-04-27 NOTE — Addendum Note (Signed)
Addended by: Carole Binning on: 04/27/2022 12:28 PM   Modules accepted: Orders

## 2022-04-27 NOTE — Telephone Encounter (Signed)
Patient called the office requesting lab orders be sent to [facility] in [place]. Patient plans on going tomorrow.

## 2022-04-27 NOTE — Telephone Encounter (Signed)
Lab orders released.  

## 2022-04-27 NOTE — Addendum Note (Signed)
Addended by: Carole Binning on: 04/27/2022 12:27 PM   Modules accepted: Orders

## 2022-04-28 DIAGNOSIS — M25531 Pain in right wrist: Secondary | ICD-10-CM | POA: Diagnosis not present

## 2022-04-28 DIAGNOSIS — M1A09X0 Idiopathic chronic gout, multiple sites, without tophus (tophi): Secondary | ICD-10-CM | POA: Diagnosis not present

## 2022-04-28 DIAGNOSIS — M2550 Pain in unspecified joint: Secondary | ICD-10-CM | POA: Diagnosis not present

## 2022-04-28 DIAGNOSIS — M25522 Pain in left elbow: Secondary | ICD-10-CM | POA: Diagnosis not present

## 2022-04-29 NOTE — Progress Notes (Signed)
Glucose is elevated, LFTs are elevated, CBC is stable uric acid is in desirable range.  Complements are normal.  Rest the labs are pending.  Please notify patient of the elevated glucose level.  Please forward results to his PCP.

## 2022-04-30 LAB — CBC WITH DIFFERENTIAL/PLATELET
Basophils Absolute: 0.1 10*3/uL (ref 0.0–0.2)
Basos: 1 %
EOS (ABSOLUTE): 0.2 10*3/uL (ref 0.0–0.4)
Eos: 3 %
Hematocrit: 43.6 % (ref 37.5–51.0)
Hemoglobin: 15.5 g/dL (ref 13.0–17.7)
Immature Grans (Abs): 0 10*3/uL (ref 0.0–0.1)
Immature Granulocytes: 0 %
Lymphocytes Absolute: 3.2 10*3/uL — ABNORMAL HIGH (ref 0.7–3.1)
Lymphs: 36 %
MCH: 33.6 pg — ABNORMAL HIGH (ref 26.6–33.0)
MCHC: 35.6 g/dL (ref 31.5–35.7)
MCV: 95 fL (ref 79–97)
Monocytes Absolute: 0.6 10*3/uL (ref 0.1–0.9)
Monocytes: 7 %
Neutrophils Absolute: 4.9 10*3/uL (ref 1.4–7.0)
Neutrophils: 53 %
Platelets: 221 10*3/uL (ref 150–450)
RBC: 4.61 x10E6/uL (ref 4.14–5.80)
RDW: 13.5 % (ref 11.6–15.4)
WBC: 9.1 10*3/uL (ref 3.4–10.8)

## 2022-04-30 LAB — CMP14+EGFR
ALT: 54 IU/L — ABNORMAL HIGH (ref 0–44)
AST: 51 IU/L — ABNORMAL HIGH (ref 0–40)
Albumin/Globulin Ratio: 1.8 (ref 1.2–2.2)
Albumin: 4.8 g/dL (ref 4.1–5.1)
Alkaline Phosphatase: 55 IU/L (ref 44–121)
BUN/Creatinine Ratio: 12 (ref 9–20)
BUN: 11 mg/dL (ref 6–24)
Bilirubin Total: 0.6 mg/dL (ref 0.0–1.2)
CO2: 18 mmol/L — ABNORMAL LOW (ref 20–29)
Calcium: 9.5 mg/dL (ref 8.7–10.2)
Chloride: 97 mmol/L (ref 96–106)
Creatinine, Ser: 0.91 mg/dL (ref 0.76–1.27)
Globulin, Total: 2.6 g/dL (ref 1.5–4.5)
Glucose: 184 mg/dL — ABNORMAL HIGH (ref 70–99)
Potassium: 4.1 mmol/L (ref 3.5–5.2)
Sodium: 137 mmol/L (ref 134–144)
Total Protein: 7.4 g/dL (ref 6.0–8.5)
eGFR: 105 mL/min/{1.73_m2} (ref >59)

## 2022-04-30 LAB — RNP ANTIBODIES: ENA RNP Ab: 0.4 AI (ref 0.0–0.9)

## 2022-04-30 LAB — SJOGRENS SYNDROME-A EXTRACTABLE NUCLEAR ANTIBODY: ENA SSA (RO) Ab: 0.3 AI (ref 0.0–0.9)

## 2022-04-30 LAB — URIC ACID: Uric Acid: 4.7 mg/dL (ref 3.8–8.4)

## 2022-04-30 LAB — C3 AND C4
Complement C3, Serum: 138 mg/dL (ref 82–167)
Complement C4, Serum: 29 mg/dL (ref 12–38)

## 2022-04-30 LAB — ANTI-SMITH ANTIBODY: ENA SM Ab Ser-aCnc: <0.2 AI (ref 0.0–0.9)

## 2022-04-30 LAB — ANTI-DNA ANTIBODY, DOUBLE-STRANDED: dsDNA Ab: 2 IU/mL (ref 0–9)

## 2022-04-30 LAB — SJOGRENS SYNDROME-B EXTRACTABLE NUCLEAR ANTIBODY: ENA SSB (LA) Ab: <0.2 AI (ref 0.0–0.9)

## 2022-05-01 NOTE — Progress Notes (Signed)
Double-stranded DNA, Smith, RNP, SSA, SSB negative.  We will discuss results at the follow-up visit.

## 2022-05-02 ENCOUNTER — Ambulatory Visit: Payer: BC Managed Care – PPO | Admitting: Physician Assistant

## 2022-05-02 DIAGNOSIS — R768 Other specified abnormal immunological findings in serum: Secondary | ICD-10-CM

## 2022-05-02 DIAGNOSIS — I420 Dilated cardiomyopathy: Secondary | ICD-10-CM

## 2022-05-02 DIAGNOSIS — I10 Essential (primary) hypertension: Secondary | ICD-10-CM

## 2022-05-02 DIAGNOSIS — M1A09X1 Idiopathic chronic gout, multiple sites, with tophus (tophi): Secondary | ICD-10-CM

## 2022-05-02 DIAGNOSIS — M25522 Pain in left elbow: Secondary | ICD-10-CM

## 2022-05-02 DIAGNOSIS — M79672 Pain in left foot: Secondary | ICD-10-CM

## 2022-05-02 DIAGNOSIS — M25531 Pain in right wrist: Secondary | ICD-10-CM

## 2022-05-02 DIAGNOSIS — Z8639 Personal history of other endocrine, nutritional and metabolic disease: Secondary | ICD-10-CM

## 2022-05-02 DIAGNOSIS — B351 Tinea unguium: Secondary | ICD-10-CM

## 2022-05-02 DIAGNOSIS — F321 Major depressive disorder, single episode, moderate: Secondary | ICD-10-CM

## 2022-05-02 DIAGNOSIS — M7662 Achilles tendinitis, left leg: Secondary | ICD-10-CM

## 2022-05-02 DIAGNOSIS — Z86711 Personal history of pulmonary embolism: Secondary | ICD-10-CM

## 2022-05-02 DIAGNOSIS — G8929 Other chronic pain: Secondary | ICD-10-CM

## 2022-05-02 DIAGNOSIS — B353 Tinea pedis: Secondary | ICD-10-CM

## 2022-05-02 DIAGNOSIS — I5022 Chronic systolic (congestive) heart failure: Secondary | ICD-10-CM

## 2022-05-05 ENCOUNTER — Other Ambulatory Visit: Payer: Self-pay | Admitting: Rheumatology

## 2022-05-05 DIAGNOSIS — M1A09X0 Idiopathic chronic gout, multiple sites, without tophus (tophi): Secondary | ICD-10-CM

## 2022-05-06 MED ORDER — FEBUXOSTAT 80 MG PO TABS: 80.0000 mg | ORAL_TABLET | Freq: Every day | ORAL | 0 refills | Status: DC

## 2022-05-06 NOTE — Telephone Encounter (Addendum)
Next Visit: 05/31/2022  Last Visit: 03/14/2022  Last Fill: 04/12/2022  DX: Idiopathic chronic gout of multiple sites without tophus   Current Dose per office note 03/14/2022: Uloric 40 mg p.o. daily After 2 weeks he will increase Uloric dose to 80 mg p.o. daily   Labs: 04/28/2022 Glucose is elevated, LFTs are elevated, CBC is stable uric acid is in desirable range.  Complements are normal.   Patient returned call to the office and verified he has increased Uloric to 80 mg daily. Patient does prefer to take the 80 mg tablet instead of two 40 mg tablets.   Okay to refill Uloric?

## 2022-05-17 NOTE — Progress Notes (Unsigned)
Office Visit Note  Patient: Ryan Gibson             Date of Birth: 05/08/1975           MRN: [id number]             PCP: Tommie Sams, DO Referring: Tommie Sams, DO Visit Date: 05/31/2022 Occupation: @GUAROCC @  Subjective:  Recent gout flare   History of Present Illness: Ryan Gibson is a 47 y.o. male with history of gout.  He is taking uloric 80 mg daily and colchicine 0.6 mg daily.  He is tolerating Uloric and colchicine without any side effects.  Patient reports that when he initially started Uloric and then after increasing the dose of Uloric to 80 mg he developed gout flares.  He states his most recent gout flare was about 1 month ago and involved his left ankle and left wrist joint.  He states that he had leftover prednisone which he took which resolved his symptoms.  He denies any increased joint pain or inflammation at this time.  He has not noticed any improvement in the tophi in his left elbow yet.  Activities of Daily Living:  Patient reports morning stiffness for 0 minutes.   Patient Reports nocturnal pain.  Difficulty dressing/grooming: Denies Difficulty climbing stairs: Denies Difficulty getting out of chair: Denies Difficulty using hands for taps, buttons, cutlery, and/or writing: Denies  Review of Systems  Constitutional:  Negative for fatigue.  HENT:  Negative for mouth sores and mouth dryness.   Eyes:  Negative for dryness.  Respiratory:  Negative for shortness of breath.   Cardiovascular:  Negative for chest pain and palpitations.  Gastrointestinal:  Negative for blood in stool, constipation and diarrhea.  Endocrine: Negative for increased urination.  Genitourinary:  Negative for involuntary urination.  Musculoskeletal:  Positive for gait problem, myalgias, muscle tenderness and myalgias. Negative for joint pain, joint pain, joint swelling, muscle weakness and morning stiffness.  Skin:  Negative for color change, rash, hair loss and sensitivity  to sunlight.  Allergic/Immunologic: Negative for susceptible to infections.  Neurological:  Negative for dizziness and headaches.  Hematological:  Negative for swollen glands.  Psychiatric/Behavioral:  Negative for depressed mood and sleep disturbance. The patient is not nervous/anxious.     PMFS History:  Patient Active Problem List   Diagnosis Date Noted   History of pulmonary embolism 11/17/2021   Chronic systolic heart failure 11/17/2021   Acute bronchitis 09/29/2021   Hyperlipidemia 06/16/2021   Dilated cardiomyopathy 12/16/2020   Systolic CHF 12/16/2020   Family history of early CAD 12/16/2020   Pulmonary embolism 02/16/2020   Depression, major, single episode, moderate 03/07/2018   Hypogonadism male 11/24/2015   Gout 11/01/2014   Essential hypertension 03/10/2014    Past Medical History:  Diagnosis Date   Asthma    AS A CHILD ONLY   Foot pain, right    painful x 1 month and swelling    Family History  Problem Relation Age of Onset   Healthy Mother    Heart disease Father    Past Surgical History:  Procedure Laterality Date   HERNIA REPAIR  07/13/2011   [facility]   INGUINAL HERNIA REPAIR  07/13/2011   Procedure: HERNIA REPAIR INGUINAL ADULT;  Surgeon: Wilmon Arms. Corliss Skains, MD;  Location: [facility];  Service: General;  Laterality: Right;   NO PREVIOUS SURGERY     Social History   Social History Narrative   Married for 13 years.Lives with wife  and kids.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   Immunization History  Administered Date(s) Administered   Tdap 03/07/2018     Objective: Vital Signs: BP (!) 135/95 (BP Location: Left Arm, Patient Position: Sitting, Cuff Size: Normal)   Pulse 79   Resp 17   Ht 6\' 2"  (1.88 m)   Wt 269 lb 6.4 oz (122.2 kg)   BMI 34.59 kg/m    Physical Exam Vitals and nursing note reviewed.  Constitutional:      Appearance: He is well-developed.  HENT:     Head: Normocephalic and atraumatic.  Eyes:     Conjunctiva/sclera:  Conjunctivae normal.     Pupils: Pupils are equal, round, and reactive to light.  Cardiovascular:     Rate and Rhythm: Normal rate and regular rhythm.     Heart sounds: Normal heart sounds.  Pulmonary:     Effort: Pulmonary effort is normal.     Breath sounds: Normal breath sounds.  Abdominal:     General: Bowel sounds are normal.     Palpations: Abdomen is soft.  Musculoskeletal:     Cervical back: Normal range of motion and neck supple.  Skin:    General: Skin is warm and dry.     Capillary Refill: Capillary refill takes less than 2 seconds.  Neurological:     Mental Status: He is alert and oriented to person, place, and time.  Psychiatric:        Behavior: Behavior normal.      Musculoskeletal Exam: C-spine, thoracic spine, and lumbar spine good ROM.  No midline spinal tenderness.  No SI joint tenderness.  Shoulder joints, elbow joints, wrist joints, MCPs, PIPs, DIPs have good range of motion with no synovitis.  Left olecranon bursa enlargement and tophi noted.  Wrist joints, MCPs, PIPs, DIPs have good range of motion with no synovitis.  Hip joints have good range of motion with no groin pain.  Knee joints have good range of motion with no warmth or effusion.  Ankle joints have good ROM with no tenderness or joint swelling.   CDAI Exam: CDAI Score: -- Patient Global: --; Provider Global: -- Swollen: --; Tender: -- Joint Exam 05/31/2022   No joint exam has been documented for this visit   There is currently no information documented on the homunculus. Go to the Rheumatology activity and complete the homunculus joint exam.  Investigation: No additional findings.  Imaging: No results found.  Recent Labs: Lab Results  Component Value Date   WBC 9.1 04/28/2022   HGB 15.5 04/28/2022   PLT 221 04/28/2022   NA 137 04/28/2022   K 4.1 04/28/2022   CL 97 04/28/2022   CO2 18 (L) 04/28/2022   GLUCOSE 184 (H) 04/28/2022   BUN 11 04/28/2022   CREATININE 0.91 04/28/2022    BILITOT 0.6 04/28/2022   ALKPHOS 55 04/28/2022   AST 51 (H) 04/28/2022   ALT 54 (H) 04/28/2022   PROT 7.4 04/28/2022   ALBUMIN 4.8 04/28/2022   CALCIUM 9.5 04/28/2022   GFRAA 96 02/10/2020   QFTBGOLDPLUS NEGATIVE 02/23/2022    Speciality Comments: No specialty comments available.  Procedures:  No procedures performed Allergies: Vancomycin   Assessment / Plan:     Visit Diagnoses: Idiopathic chronic gout of multiple sites without tophus - Crystal proven gout dx [place] rheum. treated with allopurinol initially which was discontinued due to GI side effects: Patient was started on Uloric at her last office visit on 03/14/2022.  He to Uloric 40 mg daily  for 2 weeks and then increase to 80 mg daily.  Patient reports that he had a gout flare when initiating Uloric as well as after increasing the dose of Uloric despite taking colchicine 0.6 mg 1 tablet by mouth daily.  His most recent gout flare involves the left wrist and left ankle joint.  He took a course of prednisone which resolved his symptoms.  He has no inflammation on examination today.  He has been tolerating Uloric and colchicine without any side effects. His uric acid was within desirable range: 4.7 on 04/28/2022.  He continues to have left olecranon bursa enlargement and tophi noted.  Discussed that ideally if his uric acid level is less than 4 the tophi may start to resolve.  Patient will remain on the current dose of Uloric and colchicine as prescribed.  He was advised to notify us if he develops signs or symptoms of a flare.  Patient requested to have a prednisone taper to keep on hand if he has a flare.  Discussed the importance of avoiding a purine rich diet.  He was advised to notify us if he develops more frequent gout flares.  He will return for updated lab work in June.  Future orders were placed today.  He will follow-up in the office in 3 months or sooner if needed.  - Plan: colchicine 0.6 MG tablet  Medication monitoring encounter  -Uloric 80 mg 1 tablet by mouth daily and colchicine 0.6 mg 1 tablet by mouth daily.  CBC, CMP, uric acid level will be updated in June.  Future orders placed today.  Plan: CBC with Differential/Platelet, Uric acid, CMP14+EGFR  Pain in left elbow - Left olecranon bursa enlargement and tophi palpable over the left elbow.  Pain in both wrists: Patient had a recent gout flare involving the left wrist which resolved with the use of prednisone.  He has no tenderness or synovitis on examination today.  Chronic pain of left knee: X-rays of the left knee on 02/23/2022 were consistent with moderate osteoarthritis and mild chondromalacia patella.  No warmth or effusion noted today.  Effusion, right knee: Resolved.  No recurrence recently.  Chronic pain of both ankles: Patient had a recent gout flare in the left ankle which resolved with the use of prednisone.  He has no tenderness or inflammation on examination today.  Pain in both feet: X-rays of both feet were obtained on 02/23/2022 which were consistent with osteoarthritis and possible inflammatory arthritis.  He is not experiencing any discomfort in his feet at this time.  Positive ANA (antinuclear antibody) - ANA is low titer positive and most likely not significant.  No clinical features of systemic lupus.   Achilles tendinitis of left lower extremity: Resolved.   Other medical conditions are listed as follows:   Tinea pedis of both feet  Onychomycosis  History of pulmonary embolism - - He is on Eliquis by cardiology.  Chronic systolic heart failure  Dilated cardiomyopathy  Essential hypertension  History of hyperlipidemia  Depression, major, single episode, moderate  Hypogonadism male    Orders: Orders Placed This Encounter  Procedures   CBC with Differential/Platelet   Uric acid   CMP14+EGFR   Meds ordered this encounter  Medications   predniSONE (DELTASONE) 5 MG tablet    Sig: Take 4 tablets by mouth daily x 2 days, 3  tablets daily x 2 days, 2 tablets daily x 2 days, 1 tablet daily x 2 days.    Dispense:  20 tablet  Refill:  0   colchicine 0.6 MG tablet    Sig: Take 1 tablet (0.6 mg total) by mouth daily.    Dispense:  90 tablet    Refill:  0      Follow-Up Instructions: Return in about 3 months (around 08/30/2022) for Gout.   Gearldine Bienenstock, PA-C  Note - This record has been created using Dragon software.  Chart creation errors have been sought, but may not always  have been located. Such creation errors do not reflect on  the standard of medical care.

## 2022-05-18 ENCOUNTER — Encounter: Payer: Self-pay | Admitting: Internal Medicine

## 2022-05-18 ENCOUNTER — Ambulatory Visit: Payer: BC Managed Care – PPO | Admitting: Internal Medicine

## 2022-05-18 VITALS — BP 135/94 | HR 93 | Ht 74.0 in | Wt 269.0 lb

## 2022-05-18 DIAGNOSIS — I5022 Chronic systolic (congestive) heart failure: Secondary | ICD-10-CM | POA: Diagnosis not present

## 2022-05-18 DIAGNOSIS — Z86711 Personal history of pulmonary embolism: Secondary | ICD-10-CM

## 2022-05-18 DIAGNOSIS — I10 Essential (primary) hypertension: Secondary | ICD-10-CM

## 2022-05-18 NOTE — Progress Notes (Signed)
Primary Physician/Referring:  Tommie Sams, DO  Patient ID: Ryan Gibson, male    DOB: 08-29-1975, 47 y.o.   MRN: [id number]  Chief Complaint  Patient presents with   Chronic systolic heart failure   Follow-up   HPI:    Ryan Gibson  is a 47 y.o. Caucasian male patient with hypertension, tobacco use disorder, alcohol abuse, strong family history of premature coronary disease in his father who had coronary artery disease in his late 42s.  Patient presented to [facility] in 01/2020 where he was diagnosed with acute bilateral pulmonary emboli, DVT of the right lower extremity, and severe dilated cardiomyopathy with LVEF <20% as well as acute cholecystitis.  Initially, it was suspected that DVT and PE were related to underlying COVID-19 infection, however patient does not have any documented covid infection. Patient was initiated on guideline directed medical therapy including Jardiance, metoprolol, Entresto, Aldactone, Crestor, and Lasix.  Since then his admission in 01/2020 patient has reduced alcohol intake and recent repeat echocardiogram in 06/2021 revealed LVEF improved to 40-45% but now he is back to 35-40% per TTE in 02/2022.   Patient presents for 6 month follow-up visit. He has been feeling well since the last time he was here. No complaints or concerns today. He has stopped his Eliquis because he cannot afford it but he was originally only told he has to take it for about 6 months. His clots have dissolved and patient does not have any symptoms with activity. He does not have any chest pain or shortness of breath with maximal exertion. Patient denies edema, orthopnea, PND, claudication. Overall, he has been feeling good and is not limited by his reduced ejection fraction.  Past Medical History:  Diagnosis Date   Asthma    AS A CHILD ONLY   Foot pain, right    painful x 1 month and swelling   Past Surgical History:  Procedure Laterality Date   HERNIA REPAIR  07/13/2011   [facility]    INGUINAL HERNIA REPAIR  07/13/2011   Procedure: HERNIA REPAIR INGUINAL ADULT;  Surgeon: Wilmon Arms. Corliss Skains, MD;  Location: [facility];  Service: General;  Laterality: Right;   NO PREVIOUS SURGERY     Family History  Problem Relation Age of Onset   Healthy Mother    Heart disease Father     Social History   Tobacco Use   Smoking status: Never    Passive exposure: Never   Smokeless tobacco: Current    Types: Chew  Substance Use Topics   Alcohol use: Yes    Comment: Social   Marital Status: Married  ROS  Review of Systems  Cardiovascular:  Negative for chest pain, dyspnea on exertion, leg swelling, orthopnea and paroxysmal nocturnal dyspnea.  Gastrointestinal:  Negative for melena.  Neurological:  Negative for dizziness.   Objective  Blood pressure (!) 135/94, pulse 93, height 6\' 2"  (1.88 m), weight 269 lb (122 kg), SpO2 95 %. Body mass index is 34.54 kg/m.      05/18/2022   10:48 AM 03/14/2022   11:42 AM 03/14/2022   11:12 AM  Vitals with BMI  Height 6\' 2"   6\' 2"   Weight 269 lbs  263 lbs 13 oz  BMI 34.52  33.86  Systolic 135 131 409  Diastolic 94 92 92  Pulse 93 89 91     Physical Exam Vitals reviewed.  Constitutional:      Appearance: He is obese.  Neck:     Vascular: No  carotid bruit or JVD.  Cardiovascular:     Rate and Rhythm: Normal rate and regular rhythm.     Pulses: Normal pulses and intact distal pulses.     Heart sounds: No murmur heard.    No gallop.     Comments: Varicose veins bilateral lower extremities, left worse Pulmonary:     Effort: Pulmonary effort is normal.     Breath sounds: Normal breath sounds.  Musculoskeletal:     Right lower leg: No edema.     Left lower leg: No edema.   Physical exam unchanged compared to previous office visit.  Laboratory examination:   Recent Labs    02/23/22 1155 04/28/22 1640  NA 138 137  K 4.4 4.1  CL 102 97  CO2 18* 18*  GLUCOSE 113* 184*  BUN 13 11  CREATININE 0.92 0.91  CALCIUM 9.5 9.5    estimated creatinine clearance is 140.7 mL/min (by C-G formula based on SCr of 0.91 mg/dL).     Latest Ref Rng & Units 04/28/2022    4:40 PM 02/23/2022   11:55 AM 12/30/2020    4:39 PM  CMP  Glucose 70 - 99 mg/dL 130  865  84   BUN 6 - 24 mg/dL 11  13  10    Creatinine 0.76 - 1.27 mg/dL 7.84  6.96  2.95   Sodium 134 - 144 mmol/L 137  138  143   Potassium 3.5 - 5.2 mmol/L 4.1  4.4  4.0   Chloride 96 - 106 mmol/L 97  102  102   CO2 20 - 29 mmol/L 18  18  24    Calcium 8.7 - 10.2 mg/dL 9.5  9.5  8.9   Total Protein 6.0 - 8.5 g/dL 7.4  7.9    7.8  7.1   Total Bilirubin 0.0 - 1.2 mg/dL 0.6  0.7  0.7   Alkaline Phos 44 - 121 IU/L 55   46   AST 0 - 40 IU/L 51  29  24   ALT 0 - 44 IU/L 54  37  24       Latest Ref Rng & Units 04/28/2022    4:40 PM 02/23/2022   11:55 AM 12/30/2020    4:39 PM  CBC  WBC 3.4 - 10.8 x10E3/uL 9.1  8.2  8.6   Hemoglobin 13.0 - 17.7 g/dL 28.4  13.2  44.0   Hematocrit 37.5 - 51.0 % 43.6  46.2  41.6   Platelets 150 - 450 x10E3/uL 221  270  298    Lipid Panel     Component Value Date/Time   CHOL 160 07/23/2020 1015   TRIG 190 (H) 07/23/2020 1015   HDL 50 07/23/2020 1015   CHOLHDL 5.6 (H) 11/20/2019 1528   LDLCALC 78 07/23/2020 1015   LDLDIRECT 82 07/23/2020 1015   LABVLDL 32 07/23/2020 1015    HEMOGLOBIN A1C No results found for: "HGBA1C", "MPG" TSH No results for input(s): "TSH" in the last 8760 hours.  External labs: 07/06/2021: Hgb 14.7, HCT 42.5, platelet 193 AST 30, ALT 24, BUN 11, creatinine 0.82, GFR >60, potassium 4.0, sodium 139  Allergies   Allergies  Allergen Reactions   Vancomycin Hives    hives post op on R arm -site of infusion, and whole body itching    Medication prior to this encounter:   Outpatient Medications Prior to Visit  Medication Sig Dispense Refill   colchicine 0.6 MG tablet Take 1 tablet (0.6 mg total) by mouth daily. 90  tablet 0   empagliflozin (JARDIANCE) 10 MG TABS tablet Take 1 tablet (10 mg total) by mouth daily  before breakfast. 30 tablet 6   Febuxostat (ULORIC) 80 MG TABS Take 1 tablet (80 mg total) by mouth daily. 90 tablet 0   furosemide (LASIX) 40 MG tablet Take 1 tablet (40 mg total) by mouth daily as needed. 30 tablet 6   JARDIANCE 10 MG TABS tablet Take 1 tablet (10 mg total) by mouth daily. 90 tablet 3   metoprolol succinate (TOPROL-XL) 100 MG 24 hr tablet Take 1.5 tablets (150 mg total) by mouth daily. Take with or immediately following a meal. 90 tablet 3   rosuvastatin (CRESTOR) 20 MG tablet Take 1 tablet (20 mg total) by mouth at bedtime. 90 tablet 3   sacubitril-valsartan (ENTRESTO) 49-51 MG Take 1 tablet by mouth 2 (two) times daily. 180 tablet 1   spironolactone (ALDACTONE) 25 MG tablet Take 1 tablet (25 mg total) by mouth daily. 90 tablet 3   Testosterone Enanthate 200 MG/ML SOLN Inject 200 mg as directed once a week. 5 mL 1   apixaban (ELIQUIS) 5 MG TABS tablet Take 1 tablet (5 mg total) by mouth 2 (two) times daily. (Patient not taking: Reported on 05/18/2022) 60 tablet 6   predniSONE (DELTASONE) 20 MG tablet Take 1 tablet by mouth daily. (Patient not taking: Reported on 02/23/2022)     sertraline (ZOLOFT) 100 MG tablet Take 1 tablet (100 mg total) by mouth daily. (Patient not taking: Reported on 05/18/2022) 30 tablet 6   VERQUVO 10 MG TABS Take 1 tablet by mouth daily. (Patient not taking: Reported on 02/23/2022)     No facility-administered medications prior to visit.    FINAL MEDICATION AS OF TODAY:   Current Meds  Medication Sig   colchicine 0.6 MG tablet Take 1 tablet (0.6 mg total) by mouth daily.   empagliflozin (JARDIANCE) 10 MG TABS tablet Take 1 tablet (10 mg total) by mouth daily before breakfast.   Febuxostat (ULORIC) 80 MG TABS Take 1 tablet (80 mg total) by mouth daily.   furosemide (LASIX) 40 MG tablet Take 1 tablet (40 mg total) by mouth daily as needed.   JARDIANCE 10 MG TABS tablet Take 1 tablet (10 mg total) by mouth daily.   metoprolol succinate (TOPROL-XL) 100 MG 24  hr tablet Take 1.5 tablets (150 mg total) by mouth daily. Take with or immediately following a meal.   rosuvastatin (CRESTOR) 20 MG tablet Take 1 tablet (20 mg total) by mouth at bedtime.   sacubitril-valsartan (ENTRESTO) 49-51 MG Take 1 tablet by mouth 2 (two) times daily.   spironolactone (ALDACTONE) 25 MG tablet Take 1 tablet (25 mg total) by mouth daily.   Testosterone Enanthate 200 MG/ML SOLN Inject 200 mg as directed once a week.    Radiology:   CT angiogram 02/16/2020: Bilateral pulmonary emboli worst in the right lower lobe. No findings to suggest right heart strain are noted.   Bilateral patchy infiltrates likely related to the underlying emboli and may represent early sequelae of pulmonary infarct.   Critical Value/emergent results were called by telephone at the time of interpretation on 02/16/2020 at 10:49 am to Dr. Willy Eddy , who verbally acknowledged these results.  Cardiac Studies:   PCV MYOCARDIAL PERFUSION WITH LEXISCAN 12/09/2020 Lexiscan/modified Bruce nuclear stress test performed using 1-day protocol. Stress EKG is non-diagnostic, as this is pharmacological stress test. In addition, rest and stress EKG showed sinus rhythm, frequent PVC's, nonspecific ST-T changes. SPECT  images show small sized, medium intensity, fixed perfusion defect in basal inferior myocardium. Dilated left ventricular cavity with severe global myocardial thickening and wall motion. Stress LVEF 23%. Findings most likely represent dilated cardiomyopathy. High risk study,  Echocardiogram 07/13/2021:  Mildly depressed LV systolic function with visual EF 40-45%. Left  ventricle cavity is dilated. Normal left ventricular wall thickness.  Hypokinetic global wall motion. Doppler evidence of grade I (impaired)  diastolic dysfunction.  Mild (Grade I) mitral regurgitation.  Compared to 12/09/2020 LVEF improved from 35-40% to 40-45% otherwise no significant change.    Echocardiogram  03/01/2022: Left ventricle cavity is mildly dilated. Normal left ventricular wall thickness. Hypokinetic global wall motion. Abnormal septal wall motion due to left bundle branch block. Normal diastolic filling pattern. Moderately depressed LV systolic function with visual EF 35-40%. Calculated EF 43%. The aortic root is mildly dilated at 4.2 cm at sinus of valsalva. Ascending aorta is normal in size. . Compared to the study done on 07/13/2021, no significant change, mild aortic root dilatation at sinus of Valsalva is new.     EKG:   05/18/2022: Sinus Rhythm with incomplete RBBB, rate 86 bpm. Nonspecific ST depression.  11/17/2021: Sinus Tachycardia Left axis. Left atrial enlargement. Nonspecific ST depression  03/23/2021: Sinus rhythm rate of 70 bpm.  Normal axis.  Incomplete right bundle branch block.  Borderline LVH criteria.  EKG 07/16/2020: Normal sinus rhythm with rate of 69 bpm, left atrial enlargement, left axis deviation, left anterior fascicular block.  Incomplete right bundle branch block.  LVH.      Assessment     ICD-10-CM   1. Chronic systolic heart failure (HCC)  X32.44 EKG 12-Lead    2. Essential hypertension  I10     3. History of pulmonary embolism  Z86.711       Medications Discontinued During This Encounter  Medication Reason   sertraline (ZOLOFT) 100 MG tablet Patient Preference   VERQUVO 10 MG TABS Patient Preference    No orders of the defined types were placed in this encounter.  Orders Placed This Encounter  Procedures   EKG 12-Lead    Recommendations:   Ryan Gibson is a 47 y.o. Caucasian male patient with hypertension, tobacco use disorder, alcohol abuse, strong family history of premature coronary disease in his father who had coronary artery disease in his late 7s.   Chronic systolic heart failure (HCC) Continue Jardiance, Eliquis, and Entresto LVEF still reduced around 40% Will obtain another echo in 1 year or sooner if symptoms  develop As of now patient is ACC Stage B heart failure and NYHA Class I  Essential hypertension Continue current cardiac medications. Encourage low-sodium diet, less than 2000 mg daily.  History of pulmonary embolism Patient is no longer on Eliquis, he cannot afford it and states he was only supposed to be on it for 6 months Patient has been on Eliquis for a few years at this point and his clots have resolved Follow-up in 6 months, sooner if needed.     Clotilde Dieter, DO, [facility] 05/18/2022, 12:55 PM Office: [phone]

## 2022-05-31 ENCOUNTER — Encounter: Payer: Self-pay | Admitting: Physician Assistant

## 2022-05-31 ENCOUNTER — Ambulatory Visit: Payer: BC Managed Care – PPO | Attending: Physician Assistant | Admitting: Physician Assistant

## 2022-05-31 VITALS — BP 135/95 | HR 79 | Resp 17 | Ht 74.0 in | Wt 269.4 lb

## 2022-05-31 DIAGNOSIS — M25522 Pain in left elbow: Secondary | ICD-10-CM | POA: Diagnosis not present

## 2022-05-31 DIAGNOSIS — M1A09X0 Idiopathic chronic gout, multiple sites, without tophus (tophi): Secondary | ICD-10-CM | POA: Diagnosis not present

## 2022-05-31 DIAGNOSIS — M25532 Pain in left wrist: Secondary | ICD-10-CM

## 2022-05-31 DIAGNOSIS — M25461 Effusion, right knee: Secondary | ICD-10-CM

## 2022-05-31 DIAGNOSIS — M79671 Pain in right foot: Secondary | ICD-10-CM

## 2022-05-31 DIAGNOSIS — M79672 Pain in left foot: Secondary | ICD-10-CM

## 2022-05-31 DIAGNOSIS — E291 Testicular hypofunction: Secondary | ICD-10-CM

## 2022-05-31 DIAGNOSIS — B351 Tinea unguium: Secondary | ICD-10-CM

## 2022-05-31 DIAGNOSIS — M25571 Pain in right ankle and joints of right foot: Secondary | ICD-10-CM

## 2022-05-31 DIAGNOSIS — M25562 Pain in left knee: Secondary | ICD-10-CM

## 2022-05-31 DIAGNOSIS — G8929 Other chronic pain: Secondary | ICD-10-CM

## 2022-05-31 DIAGNOSIS — M25572 Pain in left ankle and joints of left foot: Secondary | ICD-10-CM

## 2022-05-31 DIAGNOSIS — I420 Dilated cardiomyopathy: Secondary | ICD-10-CM

## 2022-05-31 DIAGNOSIS — I5022 Chronic systolic (congestive) heart failure: Secondary | ICD-10-CM

## 2022-05-31 DIAGNOSIS — M7662 Achilles tendinitis, left leg: Secondary | ICD-10-CM

## 2022-05-31 DIAGNOSIS — I10 Essential (primary) hypertension: Secondary | ICD-10-CM

## 2022-05-31 DIAGNOSIS — R768 Other specified abnormal immunological findings in serum: Secondary | ICD-10-CM

## 2022-05-31 DIAGNOSIS — Z8639 Personal history of other endocrine, nutritional and metabolic disease: Secondary | ICD-10-CM

## 2022-05-31 DIAGNOSIS — Z86711 Personal history of pulmonary embolism: Secondary | ICD-10-CM

## 2022-05-31 DIAGNOSIS — F321 Major depressive disorder, single episode, moderate: Secondary | ICD-10-CM

## 2022-05-31 DIAGNOSIS — Z5181 Encounter for therapeutic drug level monitoring: Secondary | ICD-10-CM

## 2022-05-31 DIAGNOSIS — B353 Tinea pedis: Secondary | ICD-10-CM

## 2022-05-31 DIAGNOSIS — M25531 Pain in right wrist: Secondary | ICD-10-CM | POA: Diagnosis not present

## 2022-05-31 MED ORDER — PREDNISONE 5 MG PO TABS: ORAL_TABLET | ORAL | 0 refills | Status: DC

## 2022-05-31 MED ORDER — COLCHICINE 0.6 MG PO TABS: 0.6000 mg | ORAL_TABLET | Freq: Every day | ORAL | 0 refills | Status: DC

## 2022-05-31 NOTE — Patient Instructions (Addendum)
Standing Labs We placed an order today for your standing lab work.   Please have your standing labs drawn in June    Please have your labs drawn 2 weeks prior to your appointment so that the provider can discuss your lab results at your appointment, if possible.  Please note that you may see your imaging and lab results in MyChart before we have reviewed them. We will contact you once all results are reviewed. Please allow our office up to 72 hours to thoroughly review all of the results before contacting the office for clarification of your results.  WALK-IN LAB HOURS  Monday through Thursday from 8:00 am -12:30 pm and 1:00 pm-5:00 pm and Friday from 8:00 am-12:00 pm.  Patients with office visits requiring labs will be seen before walk-in labs.  You may encounter longer than normal wait times. Please allow additional time. Wait times may be shorter on  Monday and Thursday afternoons.  We do not book appointments for walk-in labs. We appreciate your patience and understanding with our staff.   Labs are drawn by [facility]. Please bring your co-pay at the time of your lab draw.  You may receive a bill from [facility] for your lab work.  Please note if you are on Hydroxychloroquine and and an order has been placed for a Hydroxychloroquine level,  you will need to have it drawn 4 hours or more after your last dose.  If you wish to have your labs drawn at another location, please call the office 24 hours in advance so we can fax the orders.  The office is located at [address], [address], [place], [place] [address]   If you have any questions regarding directions or hours of operation,  please call [phone].   As a reminder, please drink plenty of water prior to coming for your lab work. Thanks!  

## 2022-06-13 ENCOUNTER — Ambulatory Visit: Payer: BC Managed Care – PPO | Admitting: Physician Assistant

## 2022-08-07 ENCOUNTER — Other Ambulatory Visit: Payer: Self-pay | Admitting: Rheumatology

## 2022-08-08 NOTE — Telephone Encounter (Signed)
Last Fill: 05/06/2022  Labs: 04/28/2022 Glucose is elevated, LFTs are elevated, CBC is stable uric acid is in desirable range.     Next Visit: 08/31/2022  Last Visit: 05/31/2022  DX:  Idiopathic chronic gout of multiple sites without tophus   Current Dose per office note 05/31/2022: Uloric 80 mg 1 tablet by mouth daily   Okay to refill Uloric?

## 2022-08-09 ENCOUNTER — Other Ambulatory Visit: Payer: Self-pay | Admitting: *Deleted

## 2022-08-09 MED ORDER — PREDNISONE 5 MG PO TABS: ORAL_TABLET | ORAL | 0 refills | Status: DC

## 2022-08-09 NOTE — Telephone Encounter (Signed)
From: [name] To: Office of Gearldine Bienenstock, [place] Sent: 08/09/2022 7:59 AM EDT Subject: Medication Renewal Request  Refills have been requested for the following medications:   predniSONE (DELTASONE) 5 MG tablet Ryan Gibson [place]]  Patient Comment: Good morning! I'm having a gout flare, my knee is swollen as is my right toe. Could you please refill this please?  Preferred pharmacy: [id number] - [place], [place] - [address] AT [facility] Delivery method: [employer]

## 2022-08-09 NOTE — Telephone Encounter (Signed)
Per Rolley Sims to send in prednisone 20 mg tapering by 5 mg every 2 days.  Take prednisone in the morning with food and avoid the use of NSAIDs.  Patient should notify us if his symptoms persist or worsen after completing the prednisone taper.

## 2022-08-18 NOTE — Progress Notes (Deleted)
Office Visit Note  Patient: Ryan Gibson             Date of Birth: 1975/05/10           MRN: [id number]             PCP: Tommie Sams, DO Referring: Tommie Sams, DO Visit Date: 08/31/2022 Occupation: @GUAROCC @  Subjective:    History of Present Illness: Ryan Gibson is a 47 y.o. male with history of gout.  Patient is taking Uloric 80 mg 1 tablet by mouth daily and colchicine 0.6 mg 1 tablet by mouth daily.   Uric acid was 4.7 on 04/28/22.    Activities of Daily Living:  Patient reports morning stiffness for *** {minute/hour:19697}.   Patient {ACTIONS;DENIES/REPORTS:21021675::"Denies"} nocturnal pain.  Difficulty dressing/grooming: {ACTIONS;DENIES/REPORTS:21021675::"Denies"} Difficulty climbing stairs: {ACTIONS;DENIES/REPORTS:21021675::"Denies"} Difficulty getting out of chair: {ACTIONS;DENIES/REPORTS:21021675::"Denies"} Difficulty using hands for taps, buttons, cutlery, and/or writing: {ACTIONS;DENIES/REPORTS:21021675::"Denies"}  No Rheumatology ROS completed.   PMFS History:  Patient Active Problem List   Diagnosis Date Noted   History of pulmonary embolism 11/17/2021   Chronic systolic heart failure (HCC) 11/17/2021   Acute bronchitis 09/29/2021   Hyperlipidemia 06/16/2021   Dilated cardiomyopathy (HCC) 12/16/2020   Systolic CHF (HCC) 12/16/2020   Family history of early CAD 12/16/2020   Pulmonary embolism (HCC) 02/16/2020   Depression, major, single episode, moderate (HCC) 03/07/2018   Hypogonadism male 11/24/2015   Gout 11/01/2014   Essential hypertension 03/10/2014    Past Medical History:  Diagnosis Date   Asthma    AS A CHILD ONLY   Foot pain, right    painful x 1 month and swelling    Family History  Problem Relation Age of Onset   Healthy Mother    Heart disease Father    Past Surgical History:  Procedure Laterality Date   HERNIA REPAIR  07/13/2011   [facility]   INGUINAL HERNIA REPAIR  07/13/2011   Procedure: HERNIA REPAIR INGUINAL  ADULT;  Surgeon: Wilmon Arms. Corliss Skains, MD;  Location: [facility];  Service: General;  Laterality: Right;   NO PREVIOUS SURGERY     Social History   Social History Narrative   Married for 13 years.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   Immunization History  Administered Date(s) Administered   Tdap 03/07/2018     Objective: Vital Signs: There were no vitals taken for this visit.   Physical Exam Vitals and nursing note reviewed.  Constitutional:      Appearance: He is well-developed.  HENT:     Head: Normocephalic and atraumatic.  Eyes:     Conjunctiva/sclera: Conjunctivae normal.     Pupils: Pupils are equal, round, and reactive to light.  Cardiovascular:     Rate and Rhythm: Normal rate and regular rhythm.     Heart sounds: Normal heart sounds.  Pulmonary:     Effort: Pulmonary effort is normal.     Breath sounds: Normal breath sounds.  Abdominal:     General: Bowel sounds are normal.     Palpations: Abdomen is soft.  Musculoskeletal:     Cervical back: Normal range of motion and neck supple.  Skin:    General: Skin is warm and dry.     Capillary Refill: Capillary refill takes less than 2 seconds.  Neurological:     Mental Status: He is alert and oriented to person, place, and time.  Psychiatric:        Behavior: Behavior normal.  Musculoskeletal Exam: ***  CDAI Exam: CDAI Score: -- Patient Global: --; Provider Global: -- Swollen: --; Tender: -- Joint Exam 08/31/2022   No joint exam has been documented for this visit   There is currently no information documented on the homunculus. Go to the Rheumatology activity and complete the homunculus joint exam.  Investigation: No additional findings.  Imaging: No results found.  Recent Labs: Lab Results  Component Value Date   WBC 9.1 04/28/2022   HGB 15.5 04/28/2022   PLT 221 04/28/2022   NA 137 04/28/2022   K 4.1 04/28/2022   CL 97 04/28/2022   CO2 18 (L) 04/28/2022    GLUCOSE 184 (H) 04/28/2022   BUN 11 04/28/2022   CREATININE 0.91 04/28/2022   BILITOT 0.6 04/28/2022   ALKPHOS 55 04/28/2022   AST 51 (H) 04/28/2022   ALT 54 (H) 04/28/2022   PROT 7.4 04/28/2022   ALBUMIN 4.8 04/28/2022   CALCIUM 9.5 04/28/2022   GFRAA 96 02/10/2020   QFTBGOLDPLUS NEGATIVE 02/23/2022    Speciality Comments: No specialty comments available.  Procedures:  No procedures performed Allergies: Vancomycin   Assessment / Plan:     Visit Diagnoses: Idiopathic chronic gout of multiple sites without tophus  Pain in left elbow  Pain in both wrists  Chronic pain of left knee  Effusion, right knee  Chronic pain of both ankles  Pain in both feet  Positive ANA (antinuclear antibody)  Achilles tendinitis of left lower extremity  Tinea pedis of both feet  Onychomycosis  History of pulmonary embolism  Chronic systolic heart failure (HCC)  Dilated cardiomyopathy (HCC)  Essential hypertension  History of hyperlipidemia  Depression, major, single episode, moderate (HCC)  Hypogonadism male  Orders: No orders of the defined types were placed in this encounter.  No orders of the defined types were placed in this encounter.   Face-to-face time spent with patient was *** minutes. Greater than 50% of time was spent in counseling and coordination of care.  Follow-Up Instructions: No follow-ups on file.   Gearldine Bienenstock, PA-C  Note - This record has been created using Dragon software.  Chart creation errors have been sought, but may not always  have been located. Such creation errors do not reflect on  the standard of medical care.

## 2022-08-30 ENCOUNTER — Other Ambulatory Visit: Payer: Self-pay | Admitting: Physician Assistant

## 2022-08-30 DIAGNOSIS — M1A09X0 Idiopathic chronic gout, multiple sites, without tophus (tophi): Secondary | ICD-10-CM

## 2022-08-31 ENCOUNTER — Ambulatory Visit: Payer: BC Managed Care – PPO | Admitting: Physician Assistant

## 2022-08-31 DIAGNOSIS — G8929 Other chronic pain: Secondary | ICD-10-CM

## 2022-08-31 DIAGNOSIS — B353 Tinea pedis: Secondary | ICD-10-CM

## 2022-08-31 DIAGNOSIS — M25461 Effusion, right knee: Secondary | ICD-10-CM

## 2022-08-31 DIAGNOSIS — E291 Testicular hypofunction: Secondary | ICD-10-CM

## 2022-08-31 DIAGNOSIS — M25531 Pain in right wrist: Secondary | ICD-10-CM

## 2022-08-31 DIAGNOSIS — M25522 Pain in left elbow: Secondary | ICD-10-CM

## 2022-08-31 DIAGNOSIS — I5022 Chronic systolic (congestive) heart failure: Secondary | ICD-10-CM

## 2022-08-31 DIAGNOSIS — M79671 Pain in right foot: Secondary | ICD-10-CM

## 2022-08-31 DIAGNOSIS — M7662 Achilles tendinitis, left leg: Secondary | ICD-10-CM

## 2022-08-31 DIAGNOSIS — Z5181 Encounter for therapeutic drug level monitoring: Secondary | ICD-10-CM

## 2022-08-31 DIAGNOSIS — Z8639 Personal history of other endocrine, nutritional and metabolic disease: Secondary | ICD-10-CM

## 2022-08-31 DIAGNOSIS — B351 Tinea unguium: Secondary | ICD-10-CM

## 2022-08-31 DIAGNOSIS — F321 Major depressive disorder, single episode, moderate: Secondary | ICD-10-CM

## 2022-08-31 DIAGNOSIS — R768 Other specified abnormal immunological findings in serum: Secondary | ICD-10-CM

## 2022-08-31 DIAGNOSIS — Z86711 Personal history of pulmonary embolism: Secondary | ICD-10-CM

## 2022-08-31 DIAGNOSIS — M1A09X0 Idiopathic chronic gout, multiple sites, without tophus (tophi): Secondary | ICD-10-CM

## 2022-08-31 DIAGNOSIS — I420 Dilated cardiomyopathy: Secondary | ICD-10-CM

## 2022-08-31 DIAGNOSIS — I10 Essential (primary) hypertension: Secondary | ICD-10-CM

## 2022-09-08 NOTE — Progress Notes (Deleted)
Office Visit Note  Patient: Ryan Gibson             Date of Birth: 1975-04-30           MRN: [id number]             PCP: Tommie Sams, DO Referring: Tommie Sams, DO Visit Date: 09/13/2022 Occupation: @GUAROCC @  Subjective:    History of Present Illness: Ryan Gibson is a 47 y.o. male with history of gout.  Patient is taking Uloric 80 mg 1 tablet by mouth daily and colchicine 0.6 mg 1 tablet by mouth daily.   Uric acid was 4.7 on 04/28/22.    Activities of Daily Living:  Patient reports morning stiffness for *** {minute/hour:19697}.   Patient {ACTIONS;DENIES/REPORTS:21021675::"Denies"} nocturnal pain.  Difficulty dressing/grooming: {ACTIONS;DENIES/REPORTS:21021675::"Denies"} Difficulty climbing stairs: {ACTIONS;DENIES/REPORTS:21021675::"Denies"} Difficulty getting out of chair: {ACTIONS;DENIES/REPORTS:21021675::"Denies"} Difficulty using hands for taps, buttons, cutlery, and/or writing: {ACTIONS;DENIES/REPORTS:21021675::"Denies"}  No Rheumatology ROS completed.   PMFS History:  Patient Active Problem List   Diagnosis Date Noted  . History of pulmonary embolism 11/17/2021  . Chronic systolic heart failure (HCC) 11/17/2021  . Acute bronchitis 09/29/2021  . Hyperlipidemia 06/16/2021  . Dilated cardiomyopathy (HCC) 12/16/2020  . Systolic CHF (HCC) 12/16/2020  . Family history of early CAD 12/16/2020  . Pulmonary embolism (HCC) 02/16/2020  . Depression, major, single episode, moderate (HCC) 03/07/2018  . Hypogonadism male 11/24/2015  . Gout 11/01/2014  . Essential hypertension 03/10/2014    Past Medical History:  Diagnosis Date  . Asthma    AS A CHILD ONLY  . Foot pain, right    painful x 1 month and swelling    Family History  Problem Relation Age of Onset  . Healthy Mother   . Heart disease Father    Past Surgical History:  Procedure Laterality Date  . HERNIA REPAIR  07/13/2011   [facility]  . INGUINAL HERNIA REPAIR  07/13/2011   Procedure: HERNIA  REPAIR INGUINAL ADULT;  Surgeon: Wilmon Arms. Corliss Skains, MD;  Location: [facility];  Service: General;  Laterality: Right;  . NO PREVIOUS SURGERY     Social History   Social History Narrative   Married for 13 years.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   Immunization History  Administered Date(s) Administered  . Tdap 03/07/2018     Objective: Vital Signs: There were no vitals taken for this visit.   Physical Exam Vitals and nursing note reviewed.  Constitutional:      Appearance: He is well-developed.  HENT:     Head: Normocephalic and atraumatic.  Eyes:     Conjunctiva/sclera: Conjunctivae normal.     Pupils: Pupils are equal, round, and reactive to light.  Cardiovascular:     Rate and Rhythm: Normal rate and regular rhythm.     Heart sounds: Normal heart sounds.  Pulmonary:     Effort: Pulmonary effort is normal.     Breath sounds: Normal breath sounds.  Abdominal:     General: Bowel sounds are normal.     Palpations: Abdomen is soft.  Musculoskeletal:     Cervical back: Normal range of motion and neck supple.  Skin:    General: Skin is warm and dry.     Capillary Refill: Capillary refill takes less than 2 seconds.  Neurological:     Mental Status: He is alert and oriented to person, place, and time.  Psychiatric:        Behavior: Behavior normal.     Musculoskeletal  Exam: ***  CDAI Exam: CDAI Score: -- Patient Global: --; Provider Global: -- Swollen: --; Tender: -- Joint Exam 09/13/2022   No joint exam has been documented for this visit   There is currently no information documented on the homunculus. Go to the Rheumatology activity and complete the homunculus joint exam.  Investigation: No additional findings.  Imaging: No results found.  Recent Labs: Lab Results  Component Value Date   WBC 9.1 04/28/2022   HGB 15.5 04/28/2022   PLT 221 04/28/2022   NA 137 04/28/2022   K 4.1 04/28/2022   CL 97 04/28/2022   CO2 18 (L)  04/28/2022   GLUCOSE 184 (H) 04/28/2022   BUN 11 04/28/2022   CREATININE 0.91 04/28/2022   BILITOT 0.6 04/28/2022   ALKPHOS 55 04/28/2022   AST 51 (H) 04/28/2022   ALT 54 (H) 04/28/2022   PROT 7.4 04/28/2022   ALBUMIN 4.8 04/28/2022   CALCIUM 9.5 04/28/2022   GFRAA 96 02/10/2020   QFTBGOLDPLUS NEGATIVE 02/23/2022    Speciality Comments: No specialty comments available.  Procedures:  No procedures performed Allergies: Vancomycin   Assessment / Plan:     Visit Diagnoses: No diagnosis found.  Orders: No orders of the defined types were placed in this encounter.  No orders of the defined types were placed in this encounter.   Face-to-face time spent with patient was *** minutes. Greater than 50% of time was spent in counseling and coordination of care.  Follow-Up Instructions: No follow-ups on file.   Ellen Henri, CMA  Note - This record has been created using Animal nutritionist.  Chart creation errors have been sought, but may not always  have been located. Such creation errors do not reflect on  the standard of medical care.

## 2022-09-09 ENCOUNTER — Ambulatory Visit (INDEPENDENT_AMBULATORY_CARE_PROVIDER_SITE_OTHER): Payer: BC Managed Care – PPO | Admitting: Family Medicine

## 2022-09-09 ENCOUNTER — Other Ambulatory Visit: Payer: Self-pay

## 2022-09-09 ENCOUNTER — Observation Stay (HOSPITAL_COMMUNITY)
Admission: EM | Admit: 2022-09-09 | Discharge: 2022-09-10 | Disposition: A | Discharge status: Home / Self Care | Payer: BC Managed Care – PPO | Attending: Family Medicine | Admitting: Family Medicine

## 2022-09-09 ENCOUNTER — Other Ambulatory Visit (HOSPITAL_COMMUNITY)
Admission: RE | Admit: 2022-09-09 | Discharge: 2022-09-09 | Disposition: A | Discharge status: Home / Self Care | Payer: BC Managed Care – PPO | Source: Other Acute Inpatient Hospital | Attending: Family Medicine | Admitting: Family Medicine

## 2022-09-09 ENCOUNTER — Encounter (HOSPITAL_COMMUNITY): Payer: Self-pay | Admitting: Emergency Medicine

## 2022-09-09 ENCOUNTER — Emergency Department (HOSPITAL_COMMUNITY): Payer: BC Managed Care – PPO

## 2022-09-09 VITALS — BP 164/109 | HR 125 | Temp 97.5°F | Ht 74.0 in | Wt 267.2 lb

## 2022-09-09 DIAGNOSIS — R7309 Other abnormal glucose: Secondary | ICD-10-CM

## 2022-09-09 DIAGNOSIS — I5022 Chronic systolic (congestive) heart failure: Secondary | ICD-10-CM | POA: Diagnosis present

## 2022-09-09 DIAGNOSIS — I10 Essential (primary) hypertension: Secondary | ICD-10-CM | POA: Diagnosis present

## 2022-09-09 DIAGNOSIS — Z86718 Personal history of other venous thrombosis and embolism: Secondary | ICD-10-CM | POA: Insufficient documentation

## 2022-09-09 DIAGNOSIS — E871 Hypo-osmolality and hyponatremia: Secondary | ICD-10-CM | POA: Diagnosis not present

## 2022-09-09 DIAGNOSIS — M2540 Effusion, unspecified joint: Secondary | ICD-10-CM

## 2022-09-09 DIAGNOSIS — J45909 Unspecified asthma, uncomplicated: Secondary | ICD-10-CM | POA: Diagnosis not present

## 2022-09-09 DIAGNOSIS — R600 Localized edema: Secondary | ICD-10-CM | POA: Diagnosis not present

## 2022-09-09 DIAGNOSIS — I110 Hypertensive heart disease with heart failure: Secondary | ICD-10-CM | POA: Diagnosis not present

## 2022-09-09 DIAGNOSIS — M79602 Pain in left arm: Principal | ICD-10-CM | POA: Diagnosis present

## 2022-09-09 DIAGNOSIS — M25532 Pain in left wrist: Secondary | ICD-10-CM | POA: Diagnosis not present

## 2022-09-09 DIAGNOSIS — M19032 Primary osteoarthritis, left wrist: Secondary | ICD-10-CM | POA: Diagnosis not present

## 2022-09-09 DIAGNOSIS — Z86711 Personal history of pulmonary embolism: Secondary | ICD-10-CM | POA: Insufficient documentation

## 2022-09-09 DIAGNOSIS — R000 Tachycardia, unspecified: Secondary | ICD-10-CM | POA: Diagnosis not present

## 2022-09-09 DIAGNOSIS — R2232 Localized swelling, mass and lump, left upper limb: Secondary | ICD-10-CM | POA: Insufficient documentation

## 2022-09-09 DIAGNOSIS — L03114 Cellulitis of left upper limb: Secondary | ICD-10-CM | POA: Diagnosis not present

## 2022-09-09 DIAGNOSIS — Z79899 Other long term (current) drug therapy: Secondary | ICD-10-CM | POA: Insufficient documentation

## 2022-09-09 DIAGNOSIS — E785 Hyperlipidemia, unspecified: Secondary | ICD-10-CM | POA: Diagnosis present

## 2022-09-09 HISTORY — DX: Acute embolism and thrombosis of unspecified deep veins of unspecified lower extremity: I82.409

## 2022-09-09 LAB — CBC WITH DIFFERENTIAL/PLATELET
Abs Immature Granulocytes: 0.05 10*3/uL (ref 0.00–0.07)
Basophils Absolute: 0.1 10*3/uL (ref 0.0–0.1)
Basophils Relative: 1 %
Eosinophils Absolute: 0.1 10*3/uL (ref 0.0–0.5)
Eosinophils Relative: 1 %
HCT: 43.5 % (ref 39.0–52.0)
Hemoglobin: 15.7 g/dL (ref 13.0–17.0)
Immature Granulocytes: 0 %
Lymphocytes Relative: 24 %
Lymphs Abs: 3.2 10*3/uL (ref 0.7–4.0)
MCH: 35 pg — ABNORMAL HIGH (ref 26.0–34.0)
MCHC: 36.1 g/dL — ABNORMAL HIGH (ref 30.0–36.0)
MCV: 96.9 fL (ref 80.0–100.0)
Monocytes Absolute: 1.3 10*3/uL — ABNORMAL HIGH (ref 0.1–1.0)
Monocytes Relative: 9 %
Neutro Abs: 8.9 10*3/uL — ABNORMAL HIGH (ref 1.7–7.7)
Neutrophils Relative %: 65 %
Platelets: 209 10*3/uL (ref 150–400)
RBC: 4.49 MIL/uL (ref 4.22–5.81)
RDW: 13.8 % (ref 11.5–15.5)
WBC: 13.6 10*3/uL — ABNORMAL HIGH (ref 4.0–10.5)
nRBC: 0 % (ref 0.0–0.2)

## 2022-09-09 LAB — COMPREHENSIVE METABOLIC PANEL
ALT: 43 U/L (ref 0–44)
AST: 30 U/L (ref 15–41)
Albumin: 4.4 g/dL (ref 3.5–5.0)
Alkaline Phosphatase: 45 U/L (ref 38–126)
Anion gap: 13 (ref 5–15)
BUN: 17 mg/dL (ref 6–20)
CO2: 23 mmol/L (ref 22–32)
Calcium: 9.3 mg/dL (ref 8.9–10.3)
Chloride: 93 mmol/L — ABNORMAL LOW (ref 98–111)
Creatinine, Ser: 1.05 mg/dL (ref 0.61–1.24)
GFR, Estimated: >60 mL/min (ref >60)
Glucose, Bld: 134 mg/dL — ABNORMAL HIGH (ref 70–99)
Potassium: 3.7 mmol/L (ref 3.5–5.1)
Sodium: 129 mmol/L — ABNORMAL LOW (ref 135–145)
Total Bilirubin: 2.4 mg/dL — ABNORMAL HIGH (ref 0.3–1.2)
Total Protein: 8.7 g/dL — ABNORMAL HIGH (ref 6.5–8.1)

## 2022-09-09 LAB — SEDIMENTATION RATE: Sed Rate: 54 mm/hr — ABNORMAL HIGH (ref 0–16)

## 2022-09-09 LAB — URIC ACID: Uric Acid, Serum: 5 mg/dL (ref 3.7–8.6)

## 2022-09-09 LAB — TROPONIN I (HIGH SENSITIVITY): Troponin I (High Sensitivity): 10 ng/L (ref <18)

## 2022-09-09 LAB — D-DIMER, QUANTITATIVE: D-Dimer, Quant: 0.83 ug/mL-FEU — ABNORMAL HIGH (ref 0.00–0.50)

## 2022-09-09 MED ORDER — HYDROMORPHONE HCL 1 MG/ML IJ SOLN
INTRAMUSCULAR | Status: AC
  Administered 2022-09-09: 1 mg via INTRAVENOUS
  Filled 2022-09-09: qty 1

## 2022-09-09 MED ORDER — HYDROMORPHONE HCL 1 MG/ML IJ SOLN: 1.0000 mg | Freq: Once | INTRAMUSCULAR | Status: AC

## 2022-09-09 MED ORDER — PREDNISONE 10 MG PO TABS: ORAL_TABLET | ORAL | 0 refills | Status: DC

## 2022-09-09 MED ORDER — HYDROMORPHONE HCL 1 MG/ML IJ SOLN
1.0000 mg | Freq: Once | INTRAMUSCULAR | Status: AC
  Administered 2022-09-09: 1 mg via INTRAVENOUS
  Filled 2022-09-09: qty 1

## 2022-09-09 MED ORDER — KETOROLAC TROMETHAMINE 30 MG/ML IJ SOLN
15.0000 mg | Freq: Once | INTRAMUSCULAR | Status: AC
  Administered 2022-09-09: 15 mg via INTRAVENOUS
  Filled 2022-09-09: qty 1

## 2022-09-09 MED ORDER — ONDANSETRON HCL 4 MG/2ML IJ SOLN
4.0000 mg | Freq: Once | INTRAMUSCULAR | Status: AC
  Administered 2022-09-09: 4 mg via INTRAVENOUS
  Filled 2022-09-09: qty 2

## 2022-09-09 MED ORDER — PROCHLORPERAZINE EDISYLATE 10 MG/2ML IJ SOLN
10.0000 mg | Freq: Once | INTRAMUSCULAR | Status: AC
  Administered 2022-09-09: 10 mg via INTRAVENOUS
  Filled 2022-09-09: qty 2

## 2022-09-09 MED ORDER — SODIUM CHLORIDE 0.9 % IV SOLN
1.0000 g | Freq: Once | INTRAVENOUS | Status: AC
  Administered 2022-09-09: 1 g via INTRAVENOUS
  Filled 2022-09-09: qty 10

## 2022-09-09 NOTE — Progress Notes (Signed)
Subjective:  Patient ID: Ryan Gibson, male    DOB: [birth year]  Age: 47 y.o. MRN: [id number]  CC: Chief Complaint  Patient presents with   Gout    Possible Gout flare up in the Left forearm and in both ankles and feet Since about Wednesday afternoon Feels like razor bladed Pt also states e is getting nauseas and feels almost cold from the pain    HPI:  47 year old male with an extensive past medical history including dilated cardiomyopathy/chronic systolic heart failure, hypogonadism, gout, history of pulmonary embolism currently off of anticoagulation, hyperlipidemia presents for evaluation of the above.  Patient states that he is concerned that he has a gout flare.  Has been going on since Wednesday.  He reports pain and swelling of the left hand and forearm extending up to the elbow.  He has had chills and sweats but no documented fever.  He states that he is also having swelling of his ankles and feet.  He has recently missed work over the past 2 days.  Patient overall not feeling well.  He is accompanied by his wife today as he could not drive.  Of note, patient is off of Eliquis.  Notes report that this was discontinued due to cost.  Patient Active Problem List   Diagnosis Date Noted   History of pulmonary embolism 11/17/2021   Chronic systolic heart failure (HCC) 11/17/2021   Hyperlipidemia 06/16/2021   Dilated cardiomyopathy (HCC) 12/16/2020   Family history of early CAD 12/16/2020   Depression, major, single episode, moderate (HCC) 03/07/2018   Hypogonadism male 11/24/2015   Gout 11/01/2014   Essential hypertension 03/10/2014    Social Hx   Social History   Socioeconomic History   Marital status: Married    Spouse name: Not on file   Number of children: 4   Years of education: Not on file   Highest education level: Not on file  Occupational History   Not on file  Tobacco Use   Smoking status: Never    Passive exposure: Never   Smokeless tobacco:  Current    Types: Chew  Vaping Use   Vaping status: Never Used  Substance and Sexual Activity   Alcohol use: Yes    Comment: Social   Drug use: No   Sexual activity: Yes    Birth control/protection: None    Comment: Married  Other Topics Concern   Not on file  Social History Narrative   Married for 13 years.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   Social Determinants of Health   Financial Resource Strain: Patient Declined (09/09/2022)   Overall Financial Resource Strain (CARDIA)    Difficulty of Paying Living Expenses: Patient declined  Food Insecurity: Patient Declined (09/09/2022)   Hunger Vital Sign    Worried About Running Out of Food in the Last Year: Patient declined    Ran Out of Food in the Last Year: Patient declined  Transportation Needs: No Transportation Needs (09/09/2022)   PRAPARE - Administrator, Civil Service (Medical): No    Lack of Transportation (Non-Medical): No  Physical Activity: Insufficiently Active (09/09/2022)   Exercise Vital Sign    Days of Exercise per Week: 1 day    Minutes of Exercise per Session: 30 min  Stress: Stress Concern Present (09/09/2022)   [employer] of Occupational Health - Occupational Stress Questionnaire    Feeling of Stress : To some extent  Social Connections: Socially Integrated (09/09/2022)   Social  Connection and Isolation Panel [NHANES]    Frequency of Communication with Friends and Family: More than three times a week    Frequency of Social Gatherings with Friends and Family: Once a week    Attends Religious Services: More than 4 times per year    Active Member of [employer] or Organizations: Yes    Attends Engineer, structural: More than 4 times per year    Marital Status: Married    Review of Systems Per HPI  Objective:  BP (!) 164/109   Pulse (!) 125   Temp (!) 97.5 F (36.4 C)   Ht 6\' 2"  (1.88 m)   Wt 267 lb 3.2 oz (121.2 kg)   SpO2 95%   BMI 34.31 kg/m       09/09/2022    4:13 PM 05/31/2022    8:08 AM 05/18/2022   10:48 AM  BP/Weight  Systolic BP 164 135 135  Diastolic BP 109 95 94  Wt. (Lbs) 267.2 269.4 269  BMI 34.31 kg/m2 34.59 kg/m2 34.54 kg/m2    Physical Exam Vitals reviewed.  Constitutional:      Comments: Patient is flushed and overall appears mildly ill.  HENT:     Head: Normocephalic and atraumatic.  Cardiovascular:     Rate and Rhythm: Regular rhythm. Tachycardia present.  Pulmonary:     Effort: Pulmonary effort is normal.     Breath sounds: No wheezing.  Musculoskeletal:     Comments: Left upper extremity -patient's hand is edematous and diffusely tender to palpation.  Swelling extends up to the forearm to the elbow.  Area is warm to the touch and mildly erythematous.     Lab Results  Component Value Date   WBC 13.6 (H) 09/09/2022   HGB 15.7 09/09/2022   HCT 43.5 09/09/2022   PLT 209 09/09/2022   GLUCOSE 134 (H) 09/09/2022   CHOL 160 07/23/2020   TRIG 190 (H) 07/23/2020   HDL 50 07/23/2020   LDLDIRECT 82 07/23/2020   LDLCALC 78 07/23/2020   ALT 43 09/09/2022   AST 30 09/09/2022   NA 129 (L) 09/09/2022   K 3.7 09/09/2022   CL 93 (L) 09/09/2022   CREATININE 1.05 09/09/2022   BUN 17 09/09/2022   CO2 23 09/09/2022   TSH 2.520 07/23/2020   INR 1.4 (H) 02/16/2020     Assessment:    Problem List Items Addressed This Visit       Cardiovascular and Mediastinum   Chronic systolic heart failure (HCC)   Relevant Orders   Troponin I     Other   History of pulmonary embolism   Relevant Orders   D-dimer, quantitative   Other Visit Diagnoses     Tachycardia    -  Primary   Relevant Orders   Troponin I   Joint swelling       Relevant Orders   CBC with Differential   Elevated glucose       Relevant Orders   Comprehensive metabolic panel      Plan:   Patient tachycardic, hypertensive, and overall mildly ill-appearing.  He has a past medical history of pulm embolism, gout, and congestive  heart failure.  I advised the patient that I recommend he go directly to the hospital for further evaluation and management.  I am concerned given his exam findings and history as well as his past medical history.  Patient did not want to do so.  As a result, stat labs were obtained.  Mild  leukocytosis noted.  D-dimer elevated.  Hyponatremic.  I have spoken with the patient as his labs returned after the visit was complete and he is going directly to the hospital for further evaluation and management.  I am very concerned about the possibility of PE.  This also could also be secondary to infection of the left upper extremity.  I have spoken with the ER physician.  He will be going to the hospital as soon as he can arrange care for his children.  Everlene Other DO [facility]

## 2022-09-09 NOTE — ED Triage Notes (Signed)
Pt seen at PCP today for what he thought was a flare up of his gout. Pt with swelling to L hand and pain to L hand and arm as well as feet bilaterally. Labwork per family was "off" including an elevated d-dimer and bilirubin, and sodium.

## 2022-09-09 NOTE — ED Provider Notes (Signed)
[place] EMERGENCY DEPARTMENT AT [facility] Provider Note   CSN: [id number] Arrival date & time: 09/09/22  1843     History  Chief Complaint  Patient presents with   Abnormal Labs    Ryan Gibson is a 47 y.o. male with a history including gout, dilated cardiomyopathy, history of PE but not currently anticoagulated and history of CHF presenting for evaluation of left hand and forearm pain, swelling and redness which started 2 to 3 days ago.  He initially thought this was a flareup of his gout, was seen by his primary Dr. Adriana Simas today who was concerned about either cellulitis or DVT, outpatient labs including a WBC count of 13.6 and also D-dimer 0.83, patient was advised to present here for further evaluation and management of his symptoms.  He describes severe pain in the arm and hand reports reduced appetite along with nausea and was unable to sleep last night secondary to pain.  He is under the care of rheumatology for his gout, takes colchicine daily.  The history is provided by the patient, the spouse and medical records.       Home Medications Prior to Admission medications   Medication Sig Start Date End Date Taking? Authorizing Provider  colchicine 0.6 MG tablet Take 1 tablet (0.6 mg total) by mouth daily. 05/31/22   Gearldine Bienenstock, PA-C  empagliflozin (JARDIANCE) 10 MG TABS tablet Take 1 tablet (10 mg total) by mouth daily before breakfast. 11/18/21   Custovic, Rozell Searing, DO  Febuxostat 80 MG TABS TAKE 1 TABLET BY MOUTH EVERY DAY 08/08/22   Gearldine Bienenstock, PA-C  furosemide (LASIX) 40 MG tablet Take 1 tablet (40 mg total) by mouth daily as needed. 11/18/21 06/16/22  Custovic, Rozell Searing, DO  JARDIANCE 10 MG TABS tablet Take 1 tablet (10 mg total) by mouth daily. 08/04/21   Cantwell, Celeste C, PA-C  metoprolol succinate (TOPROL-XL) 100 MG 24 hr tablet Take 1.5 tablets (150 mg total) by mouth daily. Take with or immediately following a meal. 11/18/21 07/16/22  Custovic, Rozell Searing,  DO  predniSONE (DELTASONE) 10 MG tablet 50 mg daily x 3 days, then 40 mg daily x 3 days, then 30 mg daily x 3 days, then 20 mg daily x 3 days, then 10 mg daily x 3 days. 09/09/22   Tommie Sams, DO  rosuvastatin (CRESTOR) 20 MG tablet Take 1 tablet (20 mg total) by mouth at bedtime. 11/18/21 11/13/22  Custovic, Rozell Searing, DO  sacubitril-valsartan (ENTRESTO) 49-51 MG Take 1 tablet by mouth 2 (two) times daily. 08/09/21   Yates Decamp, MD  spironolactone (ALDACTONE) 25 MG tablet Take 1 tablet (25 mg total) by mouth daily. 11/18/21 11/13/22  Custovic, Rozell Searing, DO  Testosterone Enanthate 200 MG/ML SOLN Inject 200 mg as directed once a week. 03/09/21   Tommie Sams, DO      Allergies    Vancomycin    Review of Systems   Review of Systems  Constitutional:  Negative for chills and fever.  Gastrointestinal:  Positive for nausea and vomiting.  Musculoskeletal:  Positive for arthralgias and joint swelling. Negative for myalgias.  Skin:  Positive for color change. Negative for wound.  Neurological:  Negative for weakness and numbness.    Physical Exam Updated Vital Signs BP 138/88   Pulse 97   Temp 98.2 F (36.8 C) (Oral)   Resp 18   Ht 6\' 2"  (1.88 m)   Wt 121 kg   SpO2 100%   BMI 34.25 kg/m  Physical Exam Vitals and nursing note reviewed.  Constitutional:      Appearance: He is well-developed.  HENT:     Head: Normocephalic and atraumatic.  Eyes:     Conjunctiva/sclera: Conjunctivae normal.  Cardiovascular:     Rate and Rhythm: Normal rate and regular rhythm.     Heart sounds: Normal heart sounds.  Pulmonary:     Effort: Pulmonary effort is normal.     Breath sounds: Normal breath sounds. No wheezing.  Abdominal:     General: Bowel sounds are normal.     Palpations: Abdomen is soft.     Tenderness: There is no abdominal tenderness.  Musculoskeletal:        General: Normal range of motion.     Cervical back: Normal range of motion.  Skin:    General: Skin is warm and dry.   Neurological:     Mental Status: He is alert.     ED Results / Procedures / Treatments   Labs (all labs ordered are listed, but only abnormal results are displayed) Labs Reviewed  SEDIMENTATION RATE - Abnormal; Notable for the following components:      Result Value   Sed Rate 54 (*)    All other components within normal limits  URIC ACID  C-REACTIVE PROTEIN  PROCALCITONIN    EKG None  Radiology DG Wrist Complete Left  Result Date: 09/09/2022 CLINICAL DATA:  Pain EXAM: LEFT WRIST - COMPLETE 3+ VIEW COMPARISON:  02/23/2022 FINDINGS: Osteoarthritic changes in the radiocarpal joint with joint space narrowing. Mild widening of the scapholunate distance, stable. No acute fracture, subluxation or dislocation. Spurring noted at the 3rd [facility] joint. IMPRESSION: No acute bony abnormality. 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 Widening of the scapholunate distance. This is stable since prior study. Electronically Signed   By: Charlett Nose M.D.   On: 09/09/2022 20:21    Procedures Procedures    Medications Ordered in ED Medications  HYDROmorphone (DILAUDID) injection 1 mg (1 mg Intravenous Given 09/09/22 1957)  ondansetron (ZOFRAN) injection 4 mg (4 mg Intravenous Given 09/09/22 1958)  ketorolac (TORADOL) 30 MG/ML injection 15 mg (15 mg Intravenous Given 09/09/22 1958)  HYDROmorphone (DILAUDID) injection 1 mg (1 mg Intravenous Given 09/09/22 2030)  prochlorperazine (COMPAZINE) injection 10 mg (10 mg Intravenous Given 09/09/22 2143)  cefTRIAXone (ROCEPHIN) 1 g in sodium chloride 0.9 % 100 mL IVPB (0 g Intravenous Stopped 09/09/22 2333)    ED Course/ Medical Decision Making/ A&P                             Medical Decision Making Patient presenting with severe pain in his left hand and forearm along with erythema and edema, he has a history of gout but has a normal uric acid level today.  He does have a significant leukocytosis with a WBC count of 13.6 suggesting possible  infectious source of symptoms. With elevated d dimer in pt with prior PE, also could be secondary to dvt of left upper ext.   He does have significant erythema of his forearm, dorsal hand and most pronounced in his fifth finger.  His skin is intact with no puncture, scratches or wounds.  Severe pain with movement and palpation.    Patient has had multiple doses of Dilaudid, continues to have pain, he will need an ultrasound of his arm in the morning, will plan admission.  Amount and/or Complexity of Data Reviewed Labs: ordered.  Details: Labs from earlier today as an outpatient revealing for a WBC count of 13.6, D-dimer of 0.83, his sed rate is 54, uric acid level is 5.0, sodium 129, bilirubin 2.4. Radiology: ordered.    Details: Left wrist no acute findings.  Risk Decision regarding hospitalization.           Final Clinical Impression(s) / ED Diagnoses Final diagnoses:  Cellulitis of left upper extremity  Edema of left upper extremity    Rx / DC Orders ED Discharge Orders     None         Victoriano Lain 09/09/22 2354    Kommor, Wyn Forster, MD 09/10/22 1259

## 2022-09-09 NOTE — ED Notes (Signed)
Pt vomiting Ed provider notified

## 2022-09-09 NOTE — Patient Instructions (Signed)
I will call with lab results.  Please consider going to the ER.

## 2022-09-10 ENCOUNTER — Observation Stay (HOSPITAL_COMMUNITY): Payer: BC Managed Care – PPO

## 2022-09-10 DIAGNOSIS — I10 Essential (primary) hypertension: Secondary | ICD-10-CM

## 2022-09-10 DIAGNOSIS — E871 Hypo-osmolality and hyponatremia: Secondary | ICD-10-CM | POA: Insufficient documentation

## 2022-09-10 DIAGNOSIS — M79602 Pain in left arm: Secondary | ICD-10-CM

## 2022-09-10 DIAGNOSIS — E785 Hyperlipidemia, unspecified: Secondary | ICD-10-CM | POA: Diagnosis not present

## 2022-09-10 DIAGNOSIS — I5022 Chronic systolic (congestive) heart failure: Secondary | ICD-10-CM

## 2022-09-10 DIAGNOSIS — R600 Localized edema: Secondary | ICD-10-CM | POA: Diagnosis not present

## 2022-09-10 LAB — HEMOGLOBIN A1C
Hgb A1c MFr Bld: 4.7 % — ABNORMAL LOW (ref 4.8–5.6)
Mean Plasma Glucose: 88.19 mg/dL

## 2022-09-10 LAB — CULTURE, BLOOD (ROUTINE X 2)

## 2022-09-10 LAB — CBC WITH DIFFERENTIAL/PLATELET
Abs Immature Granulocytes: 0.04 10*3/uL (ref 0.00–0.07)
Basophils Absolute: 0.1 10*3/uL (ref 0.0–0.1)
Basophils Relative: 1 %
Eosinophils Absolute: 0.1 10*3/uL (ref 0.0–0.5)
Eosinophils Relative: 1 %
HCT: 38.3 % — ABNORMAL LOW (ref 39.0–52.0)
Hemoglobin: 13.3 g/dL (ref 13.0–17.0)
Immature Granulocytes: 0 %
Lymphocytes Relative: 20 %
Lymphs Abs: 2.1 10*3/uL (ref 0.7–4.0)
MCH: 34.9 pg — ABNORMAL HIGH (ref 26.0–34.0)
MCHC: 34.7 g/dL (ref 30.0–36.0)
MCV: 100.5 fL — ABNORMAL HIGH (ref 80.0–100.0)
Monocytes Absolute: 0.9 10*3/uL (ref 0.1–1.0)
Monocytes Relative: 9 %
Neutro Abs: 7.3 10*3/uL (ref 1.7–7.7)
Neutrophils Relative %: 69 %
Platelets: 190 10*3/uL (ref 150–400)
RBC: 3.81 MIL/uL — ABNORMAL LOW (ref 4.22–5.81)
RDW: 13.9 % (ref 11.5–15.5)
WBC: 10.5 10*3/uL (ref 4.0–10.5)
nRBC: 0 % (ref 0.0–0.2)

## 2022-09-10 LAB — COMPREHENSIVE METABOLIC PANEL
ALT: 36 U/L (ref 0–44)
AST: 24 U/L (ref 15–41)
Albumin: 3.8 g/dL (ref 3.5–5.0)
Alkaline Phosphatase: 39 U/L (ref 38–126)
Anion gap: 10 (ref 5–15)
BUN: 21 mg/dL — ABNORMAL HIGH (ref 6–20)
CO2: 27 mmol/L (ref 22–32)
Calcium: 9.1 mg/dL (ref 8.9–10.3)
Chloride: 93 mmol/L — ABNORMAL LOW (ref 98–111)
Creatinine, Ser: 0.92 mg/dL (ref 0.61–1.24)
GFR, Estimated: >60 mL/min (ref >60)
Glucose, Bld: 141 mg/dL — ABNORMAL HIGH (ref 70–99)
Potassium: 3.7 mmol/L (ref 3.5–5.1)
Sodium: 130 mmol/L — ABNORMAL LOW (ref 135–145)
Total Bilirubin: 1.7 mg/dL — ABNORMAL HIGH (ref 0.3–1.2)
Total Protein: 7.8 g/dL (ref 6.5–8.1)

## 2022-09-10 LAB — GLUCOSE, CAPILLARY
Glucose-Capillary: 123 mg/dL — ABNORMAL HIGH (ref 70–99)
Glucose-Capillary: 130 mg/dL — ABNORMAL HIGH (ref 70–99)
Glucose-Capillary: 153 mg/dL — ABNORMAL HIGH (ref 70–99)

## 2022-09-10 LAB — PROCALCITONIN: Procalcitonin: <0.1 ng/mL

## 2022-09-10 LAB — MAGNESIUM: Magnesium: 2.5 mg/dL — ABNORMAL HIGH (ref 1.7–2.4)

## 2022-09-10 LAB — C-REACTIVE PROTEIN: CRP: 19.2 mg/dL — ABNORMAL HIGH (ref <1.0)

## 2022-09-10 LAB — URIC ACID: Uric Acid, Serum: 5.3 mg/dL (ref 3.7–8.6)

## 2022-09-10 LAB — HIV ANTIBODY (ROUTINE TESTING W REFLEX): HIV Screen 4th Generation wRfx: NONREACTIVE

## 2022-09-10 MED ORDER — SPIRONOLACTONE 25 MG PO TABS: 25.0000 mg | ORAL_TABLET | Freq: Every day | ORAL | Status: DC

## 2022-09-10 MED ORDER — MORPHINE SULFATE (PF) 2 MG/ML IV SOLN
2.0000 mg | INTRAVENOUS | Status: DC | PRN
  Administered 2022-09-10: 2 mg via INTRAVENOUS
  Filled 2022-09-10: qty 1

## 2022-09-10 MED ORDER — PREDNISONE 10 MG PO TABS: ORAL_TABLET | ORAL | 0 refills | Status: DC

## 2022-09-10 MED ORDER — ROSUVASTATIN CALCIUM 20 MG PO TABS: 20.0000 mg | ORAL_TABLET | Freq: Every day | ORAL | Status: DC

## 2022-09-10 MED ORDER — ACETAMINOPHEN 325 MG PO TABS: 650.0000 mg | ORAL_TABLET | Freq: Four times a day (QID) | ORAL | Status: DC | PRN

## 2022-09-10 MED ORDER — HEPARIN SODIUM (PORCINE) 5000 UNIT/ML IJ SOLN
5000.0000 [IU] | Freq: Three times a day (TID) | INTRAMUSCULAR | Status: DC
  Administered 2022-09-10: 5000 [IU] via SUBCUTANEOUS
  Filled 2022-09-10: qty 1

## 2022-09-10 MED ORDER — ONDANSETRON HCL 4 MG/2ML IJ SOLN: 4.0000 mg | Freq: Four times a day (QID) | INTRAMUSCULAR | Status: DC | PRN

## 2022-09-10 MED ORDER — ONDANSETRON HCL 4 MG PO TABS: 4.0000 mg | ORAL_TABLET | Freq: Four times a day (QID) | ORAL | Status: DC | PRN

## 2022-09-10 MED ORDER — SACUBITRIL-VALSARTAN 49-51 MG PO TABS
1.0000 | ORAL_TABLET | Freq: Two times a day (BID) | ORAL | Status: DC
  Administered 2022-09-10: 1 via ORAL
  Filled 2022-09-10: qty 1

## 2022-09-10 MED ORDER — INSULIN ASPART 100 UNIT/ML IJ SOLN: 0.0000 [IU] | Freq: Every day | INTRAMUSCULAR | Status: DC

## 2022-09-10 MED ORDER — OXYCODONE HCL 5 MG PO TABS
5.0000 mg | ORAL_TABLET | ORAL | Status: DC | PRN
  Administered 2022-09-10 (×2): 5 mg via ORAL
  Filled 2022-09-10 (×2): qty 1

## 2022-09-10 MED ORDER — INSULIN ASPART 100 UNIT/ML IJ SOLN
0.0000 [IU] | Freq: Three times a day (TID) | INTRAMUSCULAR | Status: DC
  Administered 2022-09-10 (×2): 2 [IU] via SUBCUTANEOUS

## 2022-09-10 MED ORDER — METHYLPREDNISOLONE SODIUM SUCC 125 MG IJ SOLR
80.0000 mg | Freq: Two times a day (BID) | INTRAMUSCULAR | Status: DC
  Administered 2022-09-10: 80 mg via INTRAVENOUS
  Filled 2022-09-10: qty 2

## 2022-09-10 MED ORDER — OXYCODONE HCL 5 MG PO TABS: 5.0000 mg | ORAL_TABLET | ORAL | 0 refills | Status: AC | PRN

## 2022-09-10 MED ORDER — ACETAMINOPHEN 650 MG RE SUPP: 650.0000 mg | Freq: Four times a day (QID) | RECTAL | Status: DC | PRN

## 2022-09-10 MED ORDER — METOPROLOL SUCCINATE ER 50 MG PO TB24
150.0000 mg | ORAL_TABLET | Freq: Every day | ORAL | Status: DC
  Administered 2022-09-10: 150 mg via ORAL
  Filled 2022-09-10: qty 3

## 2022-09-10 MED ORDER — SODIUM CHLORIDE 0.9 % IV SOLN: INTRAVENOUS | Status: AC

## 2022-09-10 NOTE — Assessment & Plan Note (Signed)
Continue Crestor 

## 2022-09-10 NOTE — Assessment & Plan Note (Signed)
-   Patient is concerned for gout flare - Outpatient D-dimer was elevated at 0.83 - Ultrasound DVT left upper extremity in the a.m. - Concern for cellulitis with erythema, edema and tenderness - Covered with Rocephin - Procalcitonin pending - Holding off on further antibiotics at this time - Patient does have a leukocytosis at 13.6, but likely acute phase reactant - Sed rate 54 - History of PE who completed 6 months of Eliquis approximately 2 years ago - No current anticoagulation - X-ray left wrist shows no acute changes - Continue pain control with Tylenol, oxycodone, morphine based on pain scale - Continue to monitor

## 2022-09-10 NOTE — Discharge Summary (Signed)
Physician Discharge Summary   Patient: Ryan Gibson MRN: [id number] DOB: [birth year]  Admit date:     09/09/2022  Discharge date: 09/10/22  Discharge Physician: Kendell Bane   PCP: Tommie Sams, DO   Recommendations at discharge:   Follow-up with PCP and Dr. Ermalinda Memos in 1- 4 weeks Continue current medication and a steroid taper  Discharge Diagnoses: Principal Problem:   Left arm pain Active Problems:   Essential hypertension   Hyperlipidemia   Chronic systolic heart failure (HCC)   Hyponatremia  Resolved Problems:   * No resolved hospital problems. *  Hospital Course: Ryan Gibson is a 47 y.o. male with medical history significant of asthma, DVT, chronic CHF, hypertension, and more presents the ED with a chief complaint of left wrist pain.  Patient reports his left wrist started hurting 2-1/2 days ago.  He noticed erythema and edema at that time as well.  Patient has a history of gout.  He reports he is taking allopurinol and colchicine daily.  He reports that his provider advised him to take colchicine daily for the first year.  Patient denies any wounds, bug bites to the left wrist.  He denies any trauma.  Patient reports when he gets gout flares they are usually in his wrist or ankles.  He went to an outpatient provider today that was concerning for DVT and sent him to the ER.  Patient denies any fever, although he does report he feels like he is burning up.  He attributes this burning up feeling to pain and pain medication.  Patient reports the pain feels sharp, burning, and like razor blades in the joint.  It starts at the wrist and it radiates down to his fingers and up to the mid forearm.  Pain medications help.  Patient did have a witnessed episode of nausea vomiting in the ED.  Patient reports this was due to pain medication, and that he has not had any nausea, vomiting, abdominal pain, diarrhea, constipation at home.  Patient reports he had a blood clot a  couple years ago.  He thinks it was a PE.  He was on blood thinners for 6 months.  Patient reports that providers determine the clot to be provoked by COVID.  He was taken off of blood thinners when he followed up with his cardiologist's PA.  Patient has no other complaints at this time.   Patient does not smoke.  He drinks about twice per week and has never had withdrawals per his report.  He does not have a vaccine for COVID or flu.  Patient is full code.  ED Temp 97.5-98.2, HR 115-125, RR 20, PB 150/109-164/111, satting 95% Slight leukocytosis at 13.6, hemoglobin 15.7 Chemistry reveals a hyponatremia at 129 Sed rate 54 D-dimer 0.83 X-ray wrist shows arthritis EKG shows a heart rate of 111, sinus tach, QTc 479 Patient was given Dilaudid, Toradol, Zofran, Compazine in the ED No imaging for DVT as we do not have ultrasound until a.m. Covered with Rocephin in case of cellulitis Admission requested for intractable pain of the left upper extremity  Assessment and Plan: * Left arm pain - Presentation more consistent with flare of gout, cellulitis is ruled out - Outpatient D-dimer was elevated at 0.83 - Ultrasound DVT left upper extremity negative for DVT - Concern for cellulitis patient has received 2 dose of Rocephin In the face of no fever, no leukocytosis--antibiotics discontinued  - Procalcitonin <0.10 - Holding off on further antibiotics at this  time - Patient does have a leukocytosis at 13.6, but likely acute phase reactant - Sed rate 54 - History of PE who completed 6 months of Eliquis approximately 2 years ago - No current anticoagulation - X-ray left wrist shows no acute changes - Continue pain control with Tylenol, oxycodone,  -Upper extremity ultrasound negative for DVT   Hyponatremia - Sodium down to 129 >>130 - Previous sodium 137 in March 2024 - Possibly related to medication?? - Hold diuretic - Gentle IV hydration   Chronic systolic heart failure (HCC) -  Continue beta-blocker, statin, Entresto, Aldactone - No clinical evidence of CHF exacerbation - Should follow-up with cardiology for updated echo as last 1 was in 2021  Hyperlipidemia - Continue Crestor  Essential hypertension - Continue Toprol, and Entresto - Coherently holding diuretics secondary to hyponatremia     Consultants: none  Procedures performed: [place] right upper extremity Disposition: Home Diet recommendation:  Discharge Diet Orders (From admission, onward)     Start     Ordered   09/10/22 0000  Diet - low sodium heart healthy        09/10/22 1238           Cardiac diet DISCHARGE MEDICATION: Allergies as of 09/10/2022       Reactions   Vancomycin Hives   hives post op on R arm -site of infusion, and whole body itching        Medication List     TAKE these medications    colchicine 0.6 MG tablet Take 1 tablet (0.6 mg total) by mouth daily.   empagliflozin 10 MG Tabs tablet Commonly known as: Jardiance Take 1 tablet (10 mg total) by mouth daily before breakfast. What changed: Another medication with the same name was removed. Continue taking this medication, and follow the directions you see here.   Entresto 49-51 MG Generic drug: sacubitril-valsartan Take 1 tablet by mouth 2 (two) times daily.   Febuxostat 80 MG Tabs TAKE 1 TABLET BY MOUTH EVERY DAY   furosemide 40 MG tablet Commonly known as: LASIX Take 1 tablet (40 mg total) by mouth daily as needed.   metoprolol succinate 100 MG 24 hr tablet Commonly known as: TOPROL-XL Take 1.5 tablets (150 mg total) by mouth daily. Take with or immediately following a meal.   oxyCODONE 5 MG immediate release tablet Commonly known as: Oxy IR/ROXICODONE Take 1 tablet (5 mg total) by mouth every 4 (four) hours as needed for up to 3 days for moderate pain.   predniSONE 10 MG tablet Commonly known as: DELTASONE 50 mg daily x 3 days, then 40 mg daily x 3 days, then 30 mg daily x 3 days, then 20 mg  daily x 3 days, then 10 mg daily x 3 days.   rosuvastatin 20 MG tablet Commonly known as: CRESTOR Take 1 tablet (20 mg total) by mouth at bedtime.   spironolactone 25 MG tablet Commonly known as: ALDACTONE Take 1 tablet (25 mg total) by mouth daily.   Testosterone Enanthate 200 MG/ML Soln Inject 200 mg as directed once a week.        Discharge Exam: Ceasar Mons Weights   09/09/22 1912 09/10/22 0030  Weight: 121 kg 121.7 kg        General:  AAO x 3,  cooperative, no distress;   HEENT:  Normocephalic, PERRL, otherwise with in Normal limits   Neuro:  CNII-XII intact. , normal motor and sensation, reflexes intact   Lungs:   Clear to auscultation BL,  Respirations unlabored,  No wheezes / crackles  Cardio:    S1/S2, RRR, No murmure, No Rubs or Gallops   Abdomen:  Soft, non-tender, bowel sounds active all four quadrants, no guarding or peritoneal signs.  Muscular  skeletal:  Right upper extremity wrist swelling erythema tenderness Limited range of motion, right lower extremity ball of the foot erythema edema tenderness  Otherwise Limited exam -positive pulses - in bed, able to move all 4 extremities,   2+ pulses,  symmetric, No pitting edema  Skin:  Dry, warm to touch, negative for any Rashes,  Wounds: Please see nursing documentation          Condition at discharge: fair  The results of significant diagnostics from this hospitalization (including imaging, microbiology, ancillary and laboratory) are listed below for reference.   Imaging Studies: US Venous Img Upper Uni Left (DVT)  Result Date: 09/10/2022 CLINICAL DATA:  Left upper extremity pain and edema. EXAM: LEFT UPPER EXTREMITY VENOUS DOPPLER ULTRASOUND TECHNIQUE: Gray-scale sonography with graded compression, as well as color Doppler and duplex ultrasound were performed to evaluate the upper extremity deep venous system from the level of the subclavian vein and including the jugular, axillary, basilic, radial, ulnar and  upper cephalic vein. Spectral Doppler was utilized to evaluate flow at rest and with distal augmentation maneuvers. COMPARISON:  None Available. FINDINGS: Contralateral Subclavian Vein: Respiratory phasicity is normal and symmetric with the symptomatic side. No evidence of thrombus. Normal compressibility. Internal Jugular Vein: No evidence of thrombus. Normal compressibility, respiratory phasicity and response to augmentation. Subclavian Vein: No evidence of thrombus. Normal compressibility, respiratory phasicity and response to augmentation. Axillary Vein: No evidence of thrombus. Normal compressibility, respiratory phasicity and response to augmentation. Cephalic Vein: No evidence of thrombus. Normal compressibility, respiratory phasicity and response to augmentation. Basilic Vein: No evidence of thrombus. Normal compressibility, respiratory phasicity and response to augmentation. Brachial Veins: No evidence of thrombus. Normal compressibility, respiratory phasicity and response to augmentation. Radial Veins: No evidence of thrombus. Normal compressibility, respiratory phasicity and response to augmentation. Ulnar Veins: No evidence of thrombus. Normal compressibility, respiratory phasicity and response to augmentation. Venous Reflux:  None visualized. Other Findings: No evidence of superficial thrombophlebitis or abnormal fluid collection. IMPRESSION: No evidence of DVT within the left upper extremity. Electronically Signed   By: Irish Lack M.D.   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 Mild widening of the scapholunate distance, stable. No acute fracture, subluxation or dislocation. Spurring noted at the 3rd [facility] joint. IMPRESSION: No acute bony abnormality. 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  Widening of the scapholunate distance. This is stable since prior study. Electronically Signed   By: Charlett Nose M.D.   On: 09/09/2022 20:21    Microbiology: Results for orders placed or performed during the hospital encounter of 09/11/21  Culture, group A strep     Status: None   Collection Time: 09/11/21  1:15 PM   Specimen: Throat  Result Value Ref Range Status   Specimen Description   Final    THROAT Performed at [facility], [address], [place], [place] [address]    Special Requests   Final    NONE Performed at [facility], [address] [place], [place] [address]    Culture   Final    NO GROUP A  STREP (S.PYOGENES) ISOLATED Performed at [facility], [address], [place], [place] [address]    Report Status 09/14/2021 FINAL  Final    Labs: CBC: Recent Labs  Lab 09/09/22 1717 09/10/22 0359  WBC 13.6* 10.5  NEUTROABS 8.9* 7.3  HGB 15.7 13.3  HCT 43.5 38.3*  MCV 96.9 100.5*  PLT 209 190   Basic Metabolic Panel: Recent Labs  Lab 09/09/22 1717 09/10/22 0359  NA 129* 130*  K 3.7 3.7  CL 93* 93*  CO2 23 27  GLUCOSE 134* 141*  BUN 17 21*  CREATININE 1.05 0.92  CALCIUM 9.3 9.1  MG  --  2.5*   Liver Function Tests: Recent Labs  Lab 09/09/22 1717 09/10/22 0359  AST 30 24  ALT 43 36  ALKPHOS 45 39  BILITOT 2.4* 1.7*  PROT 8.7* 7.8  ALBUMIN 4.4 3.8   CBG: Recent Labs  Lab 09/10/22 0043 09/10/22 0712 09/10/22 1127  GLUCAP 153* 130* 123*    Discharge time spent: greater than 30 minutes.  Signed: Kendell Bane, MD [facility] 09/10/2022

## 2022-09-10 NOTE — Progress Notes (Signed)
   09/10/22 1051  TOC Brief Assessment  Insurance and Status Reviewed  Patient has primary care physician Yes  Home environment has been reviewed HOme with family  Prior level of function: independent  Prior/Current Home Services No current home services  Social Determinants of Health Reivew SDOH reviewed no interventions necessary  Readmission risk has been reviewed Yes  Transition of care needs no transition of care needs at this time   Transition of Care Department [facility]) has reviewed patient and no TOC needs have been identified at this time. We will continue to monitor patient advancement through interdisciplinary progression rounds. If new patient transition needs arise, please place a TOC consult.

## 2022-09-10 NOTE — Assessment & Plan Note (Addendum)
-   Continue Toprol, and Entresto - Coherently holding diuretics secondary to hyponatremia

## 2022-09-10 NOTE — Hospital Course (Addendum)
Ryan Gibson is a 47 y.o. male with medical history significant of asthma, DVT, chronic CHF, hypertension, and more presents the ED with a chief complaint of left wrist pain.  Patient reports his left wrist started hurting 2-1/2 days ago.  He noticed erythema and edema at that time as well.  Patient has a history of gout.  He reports he is taking allopurinol and colchicine daily.  He reports that his provider advised him to take colchicine daily for the first year.  Patient denies any wounds, bug bites to the left wrist.  He denies any trauma.  Patient reports when he gets gout flares they are usually in his wrist or ankles.  He went to an outpatient provider today that was concerning for DVT and sent him to the ER.  Patient denies any fever, although he does report he feels like he is burning up.  He attributes this burning up feeling to pain and pain medication.  Patient reports the pain feels sharp, burning, and like razor blades in the joint.  It starts at the wrist and it radiates down to his fingers and up to the mid forearm.  Pain medications help.  Patient did have a witnessed episode of nausea vomiting in the ED.  Patient reports this was due to pain medication, and that he has not had any nausea, vomiting, abdominal pain, diarrhea, constipation at home.  Patient reports he had a blood clot a couple years ago.  He thinks it was a PE.  He was on blood thinners for 6 months.  Patient reports that providers determine the clot to be provoked by COVID.  He was taken off of blood thinners when he followed up with his cardiologist's PA.  Patient has no other complaints at this time.   Patient does not smoke.  He drinks about twice per week and has never had withdrawals per his report.  He does not have a vaccine for COVID or flu.  Patient is full code.  ED Temp 97.5-98.2, HR 115-125, RR 20, PB 150/109-164/111, satting 95% Slight leukocytosis at 13.6, hemoglobin 15.7 Chemistry reveals a hyponatremia  at 129 Sed rate 54 D-dimer 0.83 X-ray wrist shows arthritis EKG shows a heart rate of 111, sinus tach, QTc 479 Patient was given Dilaudid, Toradol, Zofran, Compazine in the ED No imaging for DVT as we do not have ultrasound until a.m. Covered with Rocephin in case of cellulitis Admission requested for intractable pain of the left upper extremity

## 2022-09-10 NOTE — Progress Notes (Signed)
Patient was admitted this morning due to prominent swelling of the L-arm and hand. Also has some swelling in bilateral lower limbs that is not as swollen as L-arm. Patient does have a hx of gout and DVT. Has [place] scheduled this AM to r/o DVT then can possibly go home. Denied additional needs.

## 2022-09-10 NOTE — Plan of Care (Signed)

## 2022-09-10 NOTE — H&P (Signed)
History and Physical    Patient: Ryan Gibson EXB:[id number] DOB: 1976/02/12 DOA: 09/09/2022 DOS: the patient was seen and examined on 09/10/2022 PCP: Tommie Sams, DO  Patient coming from: Home  Chief Complaint:  Chief Complaint  Patient presents with   Abnormal Labs   HPI: Ryan Gibson is a 47 y.o. male with medical history significant of asthma, DVT, chronic CHF, hypertension, and more presents the ED with a chief complaint of left wrist pain.  Patient reports his left wrist started hurting 2-1/2 days ago.  He noticed erythema and edema at that time as well.  Patient has a history of gout.  He reports he is taking allopurinol and colchicine daily.  He reports that his provider advised him to take colchicine daily for the first year.  Patient denies any wounds, bug bites to the left wrist.  He denies any trauma.  Patient reports when he gets gout flares they are usually in his wrist or ankles.  He went to an outpatient provider today that was concerning for DVT and sent him to the ER.  Patient denies any fever, although he does report he feels like he is burning up.  He attributes this burning up feeling to pain and pain medication.  Patient reports the pain feels sharp, burning, and like razor blades in the joint.  It starts at the wrist and it radiates down to his fingers and up to the mid forearm.  Pain medications help.  Patient did have a witnessed episode of nausea vomiting in the ED.  Patient reports this was due to pain medication, and that he has not had any nausea, vomiting, abdominal pain, diarrhea, constipation at home.  Patient reports he had a blood clot a couple years ago.  He thinks it was a PE.  He was on blood thinners for 6 months.  Patient reports that providers determine the clot to be provoked by COVID.  He was taken off of blood thinners when he followed up with his cardiologist's PA.  Patient has no other complaints at this time.  Patient does not smoke.  He  drinks about twice per week and has never had withdrawals per his report.  He does not have a vaccine for COVID or flu.  Patient is full code. Review of Systems: As mentioned in the history of present illness. All other systems reviewed and are negative. Past Medical History:  Diagnosis Date   Asthma    AS A CHILD ONLY   DVT (deep venous thrombosis) (HCC)    Foot pain, right    painful x 1 month and swelling   Past Surgical History:  Procedure Laterality Date   HERNIA REPAIR  07/13/2011   [facility]   INGUINAL HERNIA REPAIR  07/13/2011   Procedure: HERNIA REPAIR INGUINAL ADULT;  Surgeon: Wilmon Arms. Corliss Skains, MD;  Location: [facility];  Service: General;  Laterality: Right;   NO PREVIOUS SURGERY     Social History:  reports that he has never smoked. He has never been exposed to tobacco smoke. His smokeless tobacco use includes chew. He reports current alcohol use. He reports that he does not use drugs.  Allergies  Allergen Reactions   Vancomycin Hives    hives post op on R arm -site of infusion, and whole body itching    Family History  Problem Relation Age of Onset   Healthy Mother    Heart disease Father     Prior to Admission medications   Medication  Sig Start Date End Date Taking? Authorizing Provider  colchicine 0.6 MG tablet Take 1 tablet (0.6 mg total) by mouth daily. 05/31/22   Gearldine Bienenstock, PA-C  empagliflozin (JARDIANCE) 10 MG TABS tablet Take 1 tablet (10 mg total) by mouth daily before breakfast. 11/18/21   Custovic, Rozell Searing, DO  Febuxostat 80 MG TABS TAKE 1 TABLET BY MOUTH EVERY DAY 08/08/22   Gearldine Bienenstock, PA-C  furosemide (LASIX) 40 MG tablet Take 1 tablet (40 mg total) by mouth daily as needed. 11/18/21 06/16/22  Custovic, Rozell Searing, DO  JARDIANCE 10 MG TABS tablet Take 1 tablet (10 mg total) by mouth daily. 08/04/21   Cantwell, Celeste C, PA-C  metoprolol succinate (TOPROL-XL) 100 MG 24 hr tablet Take 1.5 tablets (150 mg total) by mouth daily. Take with or immediately following a  meal. 11/18/21 07/16/22  Custovic, Rozell Searing, DO  predniSONE (DELTASONE) 10 MG tablet 50 mg daily x 3 days, then 40 mg daily x 3 days, then 30 mg daily x 3 days, then 20 mg daily x 3 days, then 10 mg daily x 3 days. 09/09/22   Tommie Sams, DO  rosuvastatin (CRESTOR) 20 MG tablet Take 1 tablet (20 mg total) by mouth at bedtime. 11/18/21 11/13/22  Custovic, Rozell Searing, DO  sacubitril-valsartan (ENTRESTO) 49-51 MG Take 1 tablet by mouth 2 (two) times daily. 08/09/21   Yates Decamp, MD  spironolactone (ALDACTONE) 25 MG tablet Take 1 tablet (25 mg total) by mouth daily. 11/18/21 11/13/22  Custovic, Rozell Searing, DO  Testosterone Enanthate 200 MG/ML SOLN Inject 200 mg as directed once a week. 03/09/21   Tommie Sams, DO    Physical Exam: Vitals:   09/09/22 2310 09/09/22 2325 09/10/22 0015 09/10/22 0030  BP: 138/88  136/84 (!) 159/105  Pulse: 97  96 97  Resp: 18  18 18   Temp:  98.2 F (36.8 C) 98.2 F (36.8 C) 98.2 F (36.8 C)  TempSrc:  Oral Oral Oral  SpO2: 100%  98% 97%  Weight:    121.7 kg  Height:       1.  General: Patient lying supine in bed,  no acute distress   2. Psychiatric: Alert and oriented x 3, mood and behavior normal for situation, pleasant and cooperative with exam   3. Neurologic: Speech and language are normal, face is symmetric, moves all 4 extremities voluntarily, at baseline without acute deficits on limited exam   4. HEENMT:  Head is atraumatic, normocephalic, pupils reactive to light, neck is supple, trachea is midline, mucous membranes are moist   5. Respiratory : Lungs are clear to auscultation bilaterally without wheezing, rhonchi, rales, no cyanosis, no increase in work of breathing or accessory muscle use   6. Cardiovascular : Heart rate normal, rhythm is regular, no murmurs, rubs or gallops, no peripheral edema, peripheral pulses palpated   7. Gastrointestinal:  Abdomen is soft, nondistended, nontender to palpation bowel sounds active, no masses or organomegaly palpated    8. Skin:  Skin is warm, dry and intact without rashes, acute lesions, or ulcers on limited exam   9.Musculoskeletal:  Left wrist and hand are erythematous, edematous, tender to palpation.  No lower extremity edema or calf tenderness.  Data Reviewed: In the ED Temp 97.5-98.2, heart rate 115-125, respiratory rate 20, blood pressure 150/109-164/111, satting 95% Slight leukocytosis at 13.6, hemoglobin 15.7 Chemistry reveals a hyponatremia at 129 Sed rate 54 D-dimer 0.83 X-ray wrist shows arthritis EKG shows a heart rate of 111, sinus tach, QTc 479  Patient was given Dilaudid, Toradol, Zofran, Compazine in the ED No imaging for DVT as we do not have ultrasound until a.m. Covered with Rocephin in case of cellulitis Admission requested for intractable pain of the left upper extremity Assessment and Plan: * Left arm pain - Patient is concerned for gout flare - Outpatient D-dimer was elevated at 0.83 - Ultrasound DVT left upper extremity in the a.m. - Concern for cellulitis with erythema, edema and tenderness - Covered with Rocephin - Procalcitonin pending - Holding off on further antibiotics at this time - Patient does have a leukocytosis at 13.6, but likely acute phase reactant - Sed rate 54 - History of PE who completed 6 months of Eliquis approximately 2 years ago - No current anticoagulation - X-ray left wrist shows no acute changes - Continue pain control with Tylenol, oxycodone, morphine based on pain scale - Continue to monitor  Hyponatremia - Sodium down to 129 - Previous sodium 137 in March 2024 - Possibly related to medication?? - Hold diuretic - Gentle IV hydration - Trend in the a.m.  Chronic systolic heart failure (HCC) - Continue beta-blocker, statin, Entresto, Aldactone - No clinical evidence of CHF exacerbation - Should follow-up with cardiology for updated echo as last 1 was in 2021  Hyperlipidemia - Continue Crestor  Essential hypertension - Continue  Toprol, and Entresto - Coherently holding diuretics secondary to hyponatremia      Advance Care Planning:   Code Status: Full Code  Consults: None at this time  Family Communication: No family at bedside  Severity of Illness: The appropriate patient status for this patient is OBSERVATION. Observation status is judged to be reasonable and necessary in order to provide the required intensity of service to ensure the patient's safety. The patient's presenting symptoms, physical exam findings, and initial radiographic and laboratory data in the context of their medical condition is felt to place them at decreased risk for further clinical deterioration. Furthermore, it is anticipated that the patient will be medically stable for discharge from the hospital within 2 midnights of admission.   Author: Lilyan Gilford, DO 09/10/2022 1:17 AM  For on call review [email or web address].

## 2022-09-10 NOTE — Assessment & Plan Note (Addendum)
-   Continue beta-blocker, statin, Entresto, Aldactone - No clinical evidence of CHF exacerbation - Should follow-up with cardiology for updated echo as last 1 was in 2021

## 2022-09-10 NOTE — Assessment & Plan Note (Signed)
-   Sodium down to 129 - Previous sodium 137 in March 2024 - Possibly related to medication?? - Hold diuretic - Gentle IV hydration - Trend in the a.m.

## 2022-09-10 NOTE — Plan of Care (Signed)

## 2022-09-12 ENCOUNTER — Encounter: Payer: Self-pay | Admitting: Family Medicine

## 2022-09-12 LAB — CULTURE, BLOOD (ROUTINE X 2): Culture: NO GROWTH

## 2022-09-13 ENCOUNTER — Ambulatory Visit: Payer: BC Managed Care – PPO | Admitting: Physician Assistant

## 2022-09-13 DIAGNOSIS — Z8639 Personal history of other endocrine, nutritional and metabolic disease: Secondary | ICD-10-CM

## 2022-09-13 DIAGNOSIS — B353 Tinea pedis: Secondary | ICD-10-CM

## 2022-09-13 DIAGNOSIS — I5022 Chronic systolic (congestive) heart failure: Secondary | ICD-10-CM

## 2022-09-13 DIAGNOSIS — M25461 Effusion, right knee: Secondary | ICD-10-CM

## 2022-09-13 DIAGNOSIS — M25522 Pain in left elbow: Secondary | ICD-10-CM

## 2022-09-13 DIAGNOSIS — G8929 Other chronic pain: Secondary | ICD-10-CM

## 2022-09-13 DIAGNOSIS — F321 Major depressive disorder, single episode, moderate: Secondary | ICD-10-CM

## 2022-09-13 DIAGNOSIS — R768 Other specified abnormal immunological findings in serum: Secondary | ICD-10-CM

## 2022-09-13 DIAGNOSIS — M7662 Achilles tendinitis, left leg: Secondary | ICD-10-CM

## 2022-09-13 DIAGNOSIS — M1A09X0 Idiopathic chronic gout, multiple sites, without tophus (tophi): Secondary | ICD-10-CM

## 2022-09-13 DIAGNOSIS — M79671 Pain in right foot: Secondary | ICD-10-CM

## 2022-09-13 DIAGNOSIS — I10 Essential (primary) hypertension: Secondary | ICD-10-CM

## 2022-09-13 DIAGNOSIS — Z86711 Personal history of pulmonary embolism: Secondary | ICD-10-CM

## 2022-09-13 DIAGNOSIS — I420 Dilated cardiomyopathy: Secondary | ICD-10-CM

## 2022-09-13 DIAGNOSIS — M25531 Pain in right wrist: Secondary | ICD-10-CM

## 2022-09-13 DIAGNOSIS — E291 Testicular hypofunction: Secondary | ICD-10-CM

## 2022-09-13 DIAGNOSIS — B351 Tinea unguium: Secondary | ICD-10-CM

## 2022-09-13 DIAGNOSIS — Z5181 Encounter for therapeutic drug level monitoring: Secondary | ICD-10-CM

## 2022-09-13 LAB — CULTURE, BLOOD (ROUTINE X 2): Special Requests: ADEQUATE

## 2022-09-13 NOTE — Progress Notes (Unsigned)
Office Visit Note  Patient: Ryan Gibson             Date of Birth: Dec 08, 1975           MRN: [id number]             PCP: Tommie Sams, DO Referring: Tommie Sams, DO Visit Date: 09/14/2022 Occupation: @GUAROCC @  Subjective:  Pain and swelling in multiple joints   History of Present Illness: Ryan Gibson is a 47 y.o. male with history of gout.  Patient presents today with severe pain involving multiple joints.  He was evaluated last week by his PCP on 09/09/2022 due to the severity of pain and inflammation in his left wrist joint.  He ended up being evaluated in the emergency department later that day and was admitted to manage his symptoms.  Initially there was concern for possible cellulitis but later he was felt to be having a gout flare.  Patient is taking Uloric 80 mg 1 tablet by mouth daily and colchicine 0.6 mg 1 tablet by mouth daily with no improvement.  He was discharged home with a prednisone taper and is currently taking 50 mg daily.  He has tried taking Tylenol, oxycodone, colchicine, and advil with minimal relief.  His pain and inflammation are most severe in the left wrist but he continues to have ongoing swelling in both ankle joints.  He is having difficulty ambulating due to the severity of pain.  He has been out of work for the past week due to the severity of his symptoms.  He has had difficulty wearing shoes due to the pain and swelling in his ankles.  He currently rates his pain a 6 out of 10 despite taking prednisone this morning. He has noticed increased depression and anxiety which was also mentioned by his wife today on the phone.  The patient and his wife requested a medication to try to help with his mood and pain level.     Activities of Daily Living:  Patient reports morning stiffness for 2 hours.   Patient Denies nocturnal pain.  Difficulty dressing/grooming: Reports Difficulty climbing stairs: Reports Difficulty getting out of chair:  Reports Difficulty using hands for taps, buttons, cutlery, and/or writing: Reports  Review of Systems  Constitutional:  Positive for fatigue.  HENT:  Positive for mouth dryness. Negative for mouth sores.   Eyes:  Negative for dryness.  Respiratory:  Negative for shortness of breath.   Cardiovascular:  Negative for chest pain and palpitations.  Gastrointestinal:  Negative for blood in stool, constipation and diarrhea.  Endocrine: Negative for increased urination.  Genitourinary:  Negative for involuntary urination.  Musculoskeletal:  Positive for joint pain, joint pain, joint swelling, myalgias, morning stiffness, muscle tenderness and myalgias. Negative for gait problem and muscle weakness.  Skin:  Negative for color change, rash, hair loss and sensitivity to sunlight.  Allergic/Immunologic: Negative for susceptible to infections.  Neurological:  Negative for dizziness and headaches.  Hematological:  Negative for swollen glands.  Psychiatric/Behavioral:  Negative for depressed mood and sleep disturbance. The patient is not nervous/anxious.     PMFS History:  Patient Active Problem List   Diagnosis Date Noted   Hyponatremia 09/10/2022   Left arm pain 09/09/2022   History of pulmonary embolism 11/17/2021   Chronic systolic heart failure (HCC) 11/17/2021   Hyperlipidemia 06/16/2021   Dilated cardiomyopathy (HCC) 12/16/2020   Family history of early CAD 12/16/2020   Depression, major, single episode, moderate (HCC) 03/07/2018  Hypogonadism male 11/24/2015   Gout 11/01/2014   Essential hypertension 03/10/2014    Past Medical History:  Diagnosis Date   Asthma    AS A CHILD ONLY   DVT (deep venous thrombosis) (HCC)    Foot pain, right    painful x 1 month and swelling   Idiopathic gout     Family History  Problem Relation Age of Onset   Healthy Mother    Heart disease Father    Past Surgical History:  Procedure Laterality Date   HERNIA REPAIR  07/13/2011   [facility]    INGUINAL HERNIA REPAIR  07/13/2011   Procedure: HERNIA REPAIR INGUINAL ADULT;  Surgeon: Wilmon Arms. Corliss Skains, MD;  Location: [facility];  Service: General;  Laterality: Right;   Social History   Social History Narrative   Married for 13 years.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   Immunization History  Administered Date(s) Administered   Tdap 03/07/2018     Objective: Vital Signs: BP (!) 182/144 (BP Location: Right Wrist, Patient Position: Sitting, Cuff Size: Normal)   Pulse (!) 112   Resp 15   Ht 6\' 2"  (1.88 m)   Wt 261 lb (118.4 kg)   BMI 33.51 kg/m    Physical Exam Vitals and nursing note reviewed.  Constitutional:      Appearance: He is well-developed.  HENT:     Head: Normocephalic and atraumatic.  Eyes:     Conjunctiva/sclera: Conjunctivae normal.     Pupils: Pupils are equal, round, and reactive to light.  Cardiovascular:     Rate and Rhythm: Normal rate and regular rhythm.     Heart sounds: Normal heart sounds.  Pulmonary:     Effort: Pulmonary effort is normal.     Breath sounds: Normal breath sounds.  Abdominal:     General: Bowel sounds are normal.     Palpations: Abdomen is soft.  Musculoskeletal:     Cervical back: Normal range of motion and neck supple.  Skin:    General: Skin is warm and dry.     Capillary Refill: Capillary refill takes less than 2 seconds.  Neurological:     Mental Status: He is alert and oriented to person, place, and time.  Psychiatric:        Behavior: Behavior normal.      Musculoskeletal Exam: C-spine, thoracic spine, and lumbar spine good ROM.  No midline spinal tenderness.  No SI joint tenderness.  Shoulder joints have good ROM.  Mild flexion contracture in right elbow.  Olecranon bursitis of left elbow.  Tenderness and synovitis of the left wrist. Diffuse edema in left hand. No synovitis of MCP joints currently.  Hip joints have good ROM.  Knee joints have good ROM with no warmth or effusion.   Tenderness and synovitis of both ankles joints.  Possible dactylitis of the right 2nd toe.  Tenderness and synovitis of bilateral 1st MTP joints.   CDAI Exam: CDAI Score: -- Patient Global: --; Provider Global: -- Swollen: 8 ; Tender: 9  Joint Exam 09/14/2022      Right  Left  Elbow      Tender  Wrist     Swollen Tender  Ankle  Swollen Tender  Swollen Tender  MTP 1  Swollen Tender  Swollen Tender  MTP 2  Swollen Tender     PIP 2 (toe)  Swollen Tender     DIP 2 (toe)  Swollen Tender        Investigation: No  additional findings.  Imaging: US Venous Img Upper Uni Left (DVT)  Result Date: 09/10/2022 CLINICAL DATA:  Left upper extremity pain and edema. EXAM: LEFT UPPER EXTREMITY VENOUS DOPPLER ULTRASOUND TECHNIQUE: Gray-scale sonography with graded compression, as well as color Doppler and duplex ultrasound were performed to evaluate the upper extremity deep venous system from the level of the subclavian vein and including the jugular, axillary, basilic, radial, ulnar and upper cephalic vein. Spectral Doppler was utilized to evaluate flow at rest and with distal augmentation maneuvers. COMPARISON:  None Available. FINDINGS: Contralateral Subclavian Vein: Respiratory phasicity is normal and symmetric with the symptomatic side. No evidence of thrombus. Normal compressibility. Internal Jugular Vein: No evidence of thrombus. Normal compressibility, respiratory phasicity and response to augmentation. Subclavian Vein: No evidence of thrombus. Normal compressibility, respiratory phasicity and response to augmentation. Axillary Vein: No evidence of thrombus. Normal compressibility, respiratory phasicity and response to augmentation. Cephalic Vein: No evidence of thrombus. Normal compressibility, respiratory phasicity and response to augmentation. Basilic Vein: No evidence of thrombus. Normal compressibility, respiratory phasicity and response to augmentation. Brachial Veins: No evidence of thrombus.  Normal compressibility, respiratory phasicity and response to augmentation. Radial Veins: No evidence of thrombus. Normal compressibility, respiratory phasicity and response to augmentation. Ulnar Veins: No evidence of thrombus. Normal compressibility, respiratory phasicity and response to augmentation. Venous Reflux:  None visualized. Other Findings: No evidence of superficial thrombophlebitis or abnormal fluid collection. IMPRESSION: No evidence of DVT within the left upper extremity. Electronically Signed   By: Irish Lack M.D.   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 Mild widening of the scapholunate distance, stable. No acute fracture, subluxation or dislocation. Spurring noted at the 3rd [facility] joint. IMPRESSION: No acute bony abnormality. 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 Widening of the scapholunate distance. This is stable since prior study. Electronically Signed   By: Charlett Nose M.D.   On: 09/09/2022 20:21    Recent Labs: Lab Results  Component Value Date   WBC 10.5 09/10/2022   HGB 13.3 09/10/2022   PLT 190 09/10/2022   NA 130 (L) 09/10/2022   K 3.7 09/10/2022   CL 93 (L) 09/10/2022   CO2 27 09/10/2022   GLUCOSE 141 (H) 09/10/2022   BUN 21 (H) 09/10/2022   CREATININE 0.92 09/10/2022   BILITOT 1.7 (H) 09/10/2022   ALKPHOS 39 09/10/2022   AST 24 09/10/2022   ALT 36 09/10/2022   PROT 7.8 09/10/2022   ALBUMIN 3.8 09/10/2022   CALCIUM 9.1 09/10/2022   GFRAA 96 02/10/2020   QFTBGOLDPLUS NEGATIVE 02/23/2022    Speciality Comments: No specialty comments available.  Procedures:  No procedures performed Allergies: Vancomycin    Assessment / Plan:     Visit Diagnoses: Seronegative rheumatoid arthritis (HCC): RF-, Anti-CCP-, elevated ESR, elevated CRP, hx of  achilles tendonitis, recurrent joint swelling/knee effusions, possible dactylitis of right 2nd toe, no history of psoriasis: Patient presents today experiencing severe pain and inflammation involving multiple joints.  He was evaluated by his PCP as well as in the emergency department on 09/09/2022 due to the severity of pain and inflammation involving multiple joint.  In the ED he was initially felt to have cellulitis and was started on Rocephin but upon discharge his symptoms were consistent with a gout flare.  Sed rate was 54 and CRP  was 19.2 on 08/1922. He was discharged home on prednisone and is currently taking prednisone 50 mg daily.  He was also given a course of oxycodone for pain relief.  He continues to have severe pain involving multiple joints.  He is been having significant nocturnal pain as well.  He has had difficulty ambulating due to the severity of pain and swelling in both ankle joints.  His pain is most severe in the left wrist currently.  On examination he has tenderness and synovitis involving multiple joints.  Possible dactylitis noted in the right second toe. For management of gout he has been taking Uloric 80 mg daily and colchicine 0.6 mg 1 tablet daily.  He has continued to have flares involving multiple joints despite taking Uloric as prescribed.  Discussed the possibility of another type of inflammatory arthritis such as seronegative rheumatoid arthritis or psoriatic arthritis.  He has no personal or family history of psoriasis.  Different treatment options were discussed today for management of chronic inflammatory arthritis.  His treatment options are somewhat limited given history of PE/DVT, systolic heart failure, and alcohol use/elevated LFTs.  Plan to avoid TNF inhibitors, Jak inhibitors, methotrexate, and arava. Reviewed the indications, contraindications, and potential side effects of Orencia today in detail.  All questions were addressed and consent was obtained.  Plan to apply  for Orencia 125 mg sq injections once weekly through his insurance and once approved he will return to the office for administration of the first injection.  In the meantime he will complete the prednisone taper prescribed in the ED. once he reaches prednisone 20 mg daily he will start tapering by 5 mg every week.  A new prescription for prednisone was sent to the pharmacy to extend the taper.  He will follow-up in the office in 6 to 8 weeks to assess his response to Orencia.  Plan to complete FMLA paperwork to protect him during flares.  We can also complete ADA paperwork to accommodate comfortable shoes that he can wear due to the severity of pain and inflammation in his ankle joints and feet.  Medication counseling:  TB Gold: Negative on 02/23/22  Hepatitis panel: hepatitis B and C negative on 02/23/22.  HIV: Negative on 09/10/22  SPEP: Normal 02/23/22  Immunoglobulins: WNL on 02/23/22   Does patient have a diagnosis of COPD? No  Counseled patient that Dub Amis is a selective T-cell costimulation blocker indicated for rheumatoid arthritis.  Counseled patient on purpose, proper use, and adverse effects of Orencia. The most common adverse effects are increased risk of infections, headache, and injection site reactions.  There is the possibility of an increased risk of malignancy but it is not well understood if this increased risk is due to the medication or the disease state.  Reviewed the importance of regular labs while on Orencia therapy.  Counseled patient that Dub Amis should be held prior to scheduled surgery.  Counseled patient to avoid live vaccines while on Orencia.  Advised patient to get annual influenza vaccine and the pneumococcal vaccine as indicated.  Provided patient with medication education material and answered all questions.  Patient consented to [facility].  Will upload consent into patient's chart.  Will apply for Orencia through patient's insurance.  Reviewed storage information for Orencia.   Advised initial injection must be administered in office.    High risk medication use: Plan to apply for Orencia 125 mg sq injections once weekly.  CBC and CMP updated on 09/10/2022.  Plan to update CBC and CMP  1 month and every 3 months after initiating Orencia.  Standing orders for CBC and CMP will be placed today. TB Gold negative on 02/23/2022.  Baseline immunosuppressive labs obtained on 02/23/2022. Chest x-ray 02/16/2020. He is not a good candidate for TNF inhibitors given history of systolic heart failure.   Not a good candidate for methotrexate or Arava due to alcohol use and elevated LFTs.  Not a good candidate for Jak inhibitors given history of PE and DVT.  Idiopathic chronic gout of multiple sites without tophus - [place] proven gout dx [place] rheum. Treated with allopurinol initially which was discontinued due to GI side effects: Uric acid: 5.3 on 09/10/2022.  Uric acid levels within the desirable range despite recurrent flares. He will remain on Uloric 80 mg daily and colchicine 0.6 mg 1 tablet by mouth daily.  A refill of colchicine was sent to the pharmacy today.  - Plan: colchicine 0.6 MG tablet  Medication monitoring encounter - Uloric 80 mg 1 tablet by mouth daily and colchicine 0.6 mg 1 tablet by mouth daily.  Pain in left elbow - Left olecranon bursa enlargement and tophi/nodule palpable over the left elbow.  Pain in both wrists: Patient presents today with severe pain and inflammation involving the left wrist joint.  He is currently taking prednisone 50 mg daily but is having ongoing pain and inflammation.  The left wrist pain has not improved with the use of oxycodone.  His symptoms are consistent with chronic inflammatory arthritis.  Plan on adding Orencia as discussed above.  Chronic pain of left knee: Intermittent discomfort.  No warmth or effusion noted today.  Effusion, right knee -He continues to have intermittent discomfort and inflammation in both knees.  No knee joint  effusion noted today.  Chronic pain of both ankles: Patient presents today with severe pain and synovitis involving both ankle joints.  He is currently having difficulty ambulating due to the severity of discomfort.  Plan on filling out FMLA paperwork as well as ADA paperwork for work accommodations.  Pain in both feet: Presents today with severe pain in both feet.  He is having difficulty ambulating as well as wearing certain shoes due to severity of symptoms.  He has synovitis in both ankle joints and possible dactylitis in the right second toe.  Inflammation and tenderness noted in bilateral first MTP joints.  Positive ANA (antinuclear antibody) - ANA is low titer positive and most likely not significant.  No clinical features of systemic lupus.  Achilles tendinitis of left lower extremity - Resolved.  Depression, major, single episode, moderate (HCC): Patient has noticed increased depression and anxiety.  Not currently taking any antidepressants.  The patient and his wife both question the possibility of initiating something to help with his mood.  Plan to initiate a trial of cymbalta 30 mg 1 capsule by mouth daily.  Reviewed indications, contraindications, and potential side effects of cymbalta today.  He was advised to notify us if he develops any worsening mood changes or SI.  If he finds cymbalta to be helpful we can increase the dose to 60 mg daily in the future.   Other medical conditions are listed as follows:   History of pulmonary embolism - He is on Eliquis by cardiology.  Not a good candidate for Jak inhibitors.   Chronic systolic heart failure [facility]): Not a good candidate for TNFi.   Dilated cardiomyopathy (HCC)  Essential hypertension: BP was significantly elevated today in the office-182/144.  Patient was advised to contact  his PCP and cardiologist today.  Patient is currently taking prednisone 50 mg daily and is in severe pain.  Tinea pedis of both  feet  Onychomycosis  History of hyperlipidemia  Hypogonadism male   Orders: Orders Placed This Encounter  Procedures   CBC with Differential/Platelet   COMPLETE METABOLIC PANEL WITH GFR   Meds ordered this encounter  Medications   colchicine 0.6 MG tablet    Sig: Take 1 tablet (0.6 mg total) by mouth daily.    Dispense:  90 tablet    Refill:  0   DULoxetine (CYMBALTA) 30 MG capsule    Sig: Take 1 capsule (30 mg total) by mouth daily.    Dispense:  30 capsule    Refill:  1   predniSONE (DELTASONE) 5 MG tablet    Sig: Take 4 tablets by mouth daily x 1 week, 3 tablets daily x 1 week, 2 tablets daily x 1 week, 1 tablet daily x 1 week.    Dispense:  70 tablet    Refill:  0    Follow-Up Instructions: Return in about 2 months (around 11/15/2022) for Rheumatoid arthritis, Gout.   Ryan Bienenstock, PA-C  Note - This record has been created using Dragon software.  Chart creation errors have been sought, but may not always  have been located. Such creation errors do not reflect on  the standard of medical care.

## 2022-09-14 ENCOUNTER — Telehealth: Payer: Self-pay | Admitting: Family Medicine

## 2022-09-14 ENCOUNTER — Encounter: Payer: Self-pay | Admitting: Physician Assistant

## 2022-09-14 ENCOUNTER — Ambulatory Visit: Payer: BC Managed Care – PPO | Attending: Physician Assistant | Admitting: Physician Assistant

## 2022-09-14 ENCOUNTER — Telehealth: Payer: Self-pay | Admitting: Pharmacist

## 2022-09-14 ENCOUNTER — Encounter: Payer: Self-pay | Admitting: *Deleted

## 2022-09-14 VITALS — BP 182/144 | HR 112 | Resp 15 | Ht 74.0 in | Wt 261.0 lb

## 2022-09-14 DIAGNOSIS — M79671 Pain in right foot: Secondary | ICD-10-CM

## 2022-09-14 DIAGNOSIS — M25572 Pain in left ankle and joints of left foot: Secondary | ICD-10-CM

## 2022-09-14 DIAGNOSIS — M25571 Pain in right ankle and joints of right foot: Secondary | ICD-10-CM

## 2022-09-14 DIAGNOSIS — F321 Major depressive disorder, single episode, moderate: Secondary | ICD-10-CM

## 2022-09-14 DIAGNOSIS — M0600 Rheumatoid arthritis without rheumatoid factor, unspecified site: Secondary | ICD-10-CM | POA: Diagnosis not present

## 2022-09-14 DIAGNOSIS — M7662 Achilles tendinitis, left leg: Secondary | ICD-10-CM

## 2022-09-14 DIAGNOSIS — Z79899 Other long term (current) drug therapy: Secondary | ICD-10-CM

## 2022-09-14 DIAGNOSIS — Z8639 Personal history of other endocrine, nutritional and metabolic disease: Secondary | ICD-10-CM

## 2022-09-14 DIAGNOSIS — G8929 Other chronic pain: Secondary | ICD-10-CM

## 2022-09-14 DIAGNOSIS — M25461 Effusion, right knee: Secondary | ICD-10-CM

## 2022-09-14 DIAGNOSIS — M1A09X0 Idiopathic chronic gout, multiple sites, without tophus (tophi): Secondary | ICD-10-CM

## 2022-09-14 DIAGNOSIS — R768 Other specified abnormal immunological findings in serum: Secondary | ICD-10-CM

## 2022-09-14 DIAGNOSIS — I10 Essential (primary) hypertension: Secondary | ICD-10-CM

## 2022-09-14 DIAGNOSIS — M79672 Pain in left foot: Secondary | ICD-10-CM

## 2022-09-14 DIAGNOSIS — E291 Testicular hypofunction: Secondary | ICD-10-CM

## 2022-09-14 DIAGNOSIS — M25562 Pain in left knee: Secondary | ICD-10-CM

## 2022-09-14 DIAGNOSIS — Z86711 Personal history of pulmonary embolism: Secondary | ICD-10-CM

## 2022-09-14 DIAGNOSIS — I420 Dilated cardiomyopathy: Secondary | ICD-10-CM

## 2022-09-14 DIAGNOSIS — B351 Tinea unguium: Secondary | ICD-10-CM

## 2022-09-14 DIAGNOSIS — M25531 Pain in right wrist: Secondary | ICD-10-CM

## 2022-09-14 DIAGNOSIS — M25522 Pain in left elbow: Secondary | ICD-10-CM

## 2022-09-14 DIAGNOSIS — Z5181 Encounter for therapeutic drug level monitoring: Secondary | ICD-10-CM | POA: Diagnosis not present

## 2022-09-14 DIAGNOSIS — B353 Tinea pedis: Secondary | ICD-10-CM

## 2022-09-14 DIAGNOSIS — I5022 Chronic systolic (congestive) heart failure: Secondary | ICD-10-CM

## 2022-09-14 DIAGNOSIS — R7689 Other specified abnormal immunological findings in serum: Secondary | ICD-10-CM

## 2022-09-14 DIAGNOSIS — M25532 Pain in left wrist: Secondary | ICD-10-CM

## 2022-09-14 MED ORDER — COLCHICINE 0.6 MG PO TABS
0.6000 mg | ORAL_TABLET | Freq: Every day | ORAL | 0 refills | Status: DC
Start: 2022-09-14 — End: 2023-01-23

## 2022-09-14 MED ORDER — DULOXETINE HCL 30 MG PO CPEP: 30.0000 mg | ORAL_CAPSULE | Freq: Every day | ORAL | 1 refills | Status: DC

## 2022-09-14 MED ORDER — PREDNISONE 5 MG PO TABS: ORAL_TABLET | ORAL | 0 refills | Status: DC

## 2022-09-14 NOTE — Telephone Encounter (Addendum)
Pending OV note from today, please start Orencia SQ BIV.  Dose: 125mg  SQ every 7 days  Current tx: - High dose prednisone (inadequate response)  Therapies unable to try: - MTX - alcohol use and unwilling to quit - leflunomide - alcohol use and unwilling to quit - JAK inhibitors Harriette Ohara, Olumiant, Rinvoq) - history of PE and CHF and family history of CAD - TNF inhibitors (Enbrel, Humira, Simponi, Cimzia, Remicade) - history of CHF - hydroxychloroquine - would not be as effective - Actemra/Kevzara- elevated LFTs history  Dx: seronegative RA  Orencia [employer] program enrollment form completed today. Pending provider sig (placed in Sherron Ales, PA-C's, folder for signature)  Chesley Mires, PharmD, MPH, BCPS, CPP Clinical Pharmacist (Rheumatology and Pulmonology)

## 2022-09-14 NOTE — Telephone Encounter (Signed)
Patient dropped off FMLA to be completed in your box.  

## 2022-09-14 NOTE — Patient Instructions (Addendum)
Duloxetine Delayed-Release Capsules What is this medication? DULOXETINE (doo LOX e teen) treats depression, anxiety, fibromyalgia, and certain types of chronic pain such as nerve, bone, or joint pain. It increases the amount of serotonin and norepinephrine in the brain, hormones that help regulate mood and pain. It belongs to a group of medications called SNRIs. This medicine may be used for other purposes; ask your health care provider or pharmacist if you have questions. COMMON BRAND NAME(S): Cymbalta, Drizalma, Irenka What should I tell my care team before I take this medication? They need to know if you have any of these conditions: Bipolar disorder Glaucoma High blood pressure Kidney disease Liver disease Seizures Suicidal thoughts, plans, or attempt by you or a family member Take medications that treat or prevent blood clots Taken an MAOI, such as Carbex, Eldepryl, Marplan, Nardil, or Parnate in the last 14 days Trouble passing urine An unusual reaction to duloxetine, other medications, foods, dyes, or preservatives Pregnant or trying to get pregnant Breastfeeding How should I use this medication? Take this medication by mouth with water. Take it as directed on the prescription label at the same time every day. Do not cut, crush, or chew this medication. Swallow the capsules whole. Some capsules may be opened and sprinkled on applesauce. Check with your care team or pharmacist if you are not sure. You can take this medication with or without food. Do not take your medication more often than directed. Do not stop taking this medication suddenly except upon the advice of your care team. Stopping this medication too quickly may cause serious side effects or your condition may worsen. A special MedGuide will be given to you by the pharmacist with each prescription and refill. Be sure to read this information carefully each time. Talk to your care team about the use of this medication in  children. While it may be prescribed for children as young as 7 years for selected conditions, precautions do apply. Overdosage: If you think you have taken too much of this medicine contact a poison control center or emergency room at once. NOTE: This medicine is only for you. Do not share this medicine with others. What if I miss a dose? If you miss a dose, take it as soon as you can. If it is almost time for your next dose, take only that dose. Do not take double or extra doses. What may interact with this medication? Do not take this medication with any of the following: Desvenlafaxine Levomilnacipran Linezolid MAOIs, such as Carbex, Eldepryl, Emsam, Marplan, Nardil, and Parnate Methylene blue (injected into a vein) Milnacipran Safinamide Thioridazine Venlafaxine Viloxazine This medication may also interact with the following: Alcohol Amphetamines Aspirin and aspirin-like medications Certain antibiotics, such as ciprofloxacin and enoxacin Certain medications for blood pressure, heart disease, irregular heart beat Certain medications for mental health conditions Certain medications for migraine headache, such as almotriptan, eletriptan, frovatriptan, naratriptan, rizatriptan, sumatriptan, zolmitriptan Certain medications that treat or prevent blood clots, such as warfarin, enoxaparin, and dalteparin Cimetidine Fentanyl Lithium NSAIDS, medications for pain and inflammation, such as ibuprofen or naproxen Phentermine Procarbazine Rasagiline Sibutramine St. John's Wort Theophylline Tramadol Tryptophan This list may not describe all possible interactions. Give your health care provider a list of all the medicines, herbs, non-prescription drugs, or dietary supplements you use. Also tell them if you smoke, drink alcohol, or use illegal drugs. Some items may interact with your medicine. What should I watch for while using this medication? Tell your care team if your  symptoms do not  get better or if they get worse. Visit your care team for regular checks on your progress. Because it may take several weeks to see the full effects of this medication, it is important to continue your treatment as prescribed by your care team. This medication may cause serious skin reactions. They can happen weeks to months after starting the medication. Contact your care team right away if you notice fevers or flu-like symptoms with a rash. The rash may be red or purple and then turn into blisters or peeling of the skin. You may also notice a red rash with swelling of the face, lips, or lymph nodes in your neck or under your arms. Watch for new or worsening thoughts of suicide or depression. This includes sudden changes in mood, behaviors, or thoughts. These changes can happen at any time but are more common in the beginning of treatment or after a change in dose. Call your care team right away if you experience these thoughts or worsening depression. This medication may cause mood and behavior changes, such as anxiety, nervousness, irritability, hostility, restlessness, excitability, hyperactivity, or trouble sleeping. These changes can happen at any time but are more common in the beginning of treatment or after a change in dose. Call your care team right away if you notice any of these symptoms. This medication may affect your coordination, reaction time, or judgment. Do not drive or operate machinery until you know how this medication affects you. Sit up or stand slowly to reduce the risk of dizzy or fainting spells. Drinking alcohol with this medication can increase the risk of these side effects. This medication may increase blood sugar. The risk may be higher in patients who already have diabetes. Ask your care team what you can do to lower your risk of diabetes while taking this medication. This medication can cause an increase in blood pressure. This medication can also cause a sudden drop in your  blood pressure, which may make you feel faint and increase the chance of a fall. These effects are most common when you first start the medication or when the dose is increased, or during use of other medications that can cause a sudden drop in blood pressure. Check with your care team for instructions on monitoring your blood pressure while taking this medication. Your mouth may get dry. Chewing sugarless gum or sucking hard candy and drinking plenty of water may help. Contact your care team if the problem does not go away or is severe. What side effects may I notice from receiving this medication? Side effects that you should report to your care team as soon as possible: Allergic reactions--skin rash, itching, hives, swelling of the face, lips, tongue, or throat Bleeding--bloody or black, tar-like stools, red or dark brown urine, vomiting blood or brown material that looks like coffee grounds, small, red or purple spots on skin, unusual bleeding or bruising Increase in blood pressure Liver injury--right upper belly pain, loss of appetite, nausea, light-colored stool, dark yellow or brown urine, yellowing skin or eyes, unusual weakness or fatigue Low sodium level--muscle weakness, fatigue, dizziness, headache, confusion Redness, blistering, peeling, or loosening of the skin, including inside the mouth Serotonin syndrome--irritability, confusion, fast or irregular heartbeat, muscle stiffness, twitching muscles, sweating, high fever, seizures, chills, vomiting, diarrhea Sudden eye pain or change in vision such as blurry vision, seeing halos around lights, vision loss Thoughts of suicide or self-harm, worsening mood, feelings of depression Trouble passing urine Side effects that  usually do not require medical attention (report to your care team if they continue or are bothersome): Change in sex drive or performance Constipation Diarrhea Dizziness Dry mouth Excessive sweating Loss of  appetite Nausea Vomiting This list may not describe all possible side effects. Call your doctor for medical advice about side effects. You may report side effects to FDA at 1-800-FDA-1088. Where should I keep my medication? Keep out of the reach of children and pets. Store at room temperature between 15 and 30 degrees C (59 to 86 degrees F). Get rid of any unused medication after the expiration date. To get rid of medications that are no longer needed or have expired: Take the medication to a medication take-back program. Check with your pharmacy or law enforcement to find a location. If you cannot return the medication, check the label or package insert to see if the medication should be thrown out in the garbage or flushed down the toilet. If you are not sure, ask your care team. If it is safe to put it in the trash, take the medication out of the container. Mix the medication with cat litter, dirt, coffee grounds, or other unwanted substance. Seal the mixture in a bag or container. Put it in the trash. NOTE: This sheet is a summary. It may not cover all possible information. If you have questions about this medicine, talk to your doctor, pharmacist, or health care provider.  2024 Elsevier/Gold Standard (2021-11-11 00:00:00)  Abatacept Injection What is this medication? ABATACEPT (a ba TA sept) treats autoimmune conditions, such as arthritis. It may also be used to treat a condition that can occur after a stem cell or bone marrow transplant (chronic graft versus host disease). It works by slowing down an overactive immune system. This medicine may be used for other purposes; ask your health care provider or pharmacist if you have questions. COMMON BRAND NAME(S): Reatha Armour ClickJect What should I tell my care team before I take this medication? They need to know if you have any of these conditions: Cancer Diabetes Having surgery Hepatitis B or history of hepatitis B infection Immune  system problems Infection, especially a viral infection, such as chickenpox, cold sores, herpes Lung or breathing problems, such as asthma or COPD Recent or upcoming vaccine Tuberculosis, a positive skin test for tuberculosis, or have recently been in close contact with someone who has tuberculosis An unusual or allergic reaction to abatacept, other medications, foods, dyes, or preservatives Pregnant or trying to get pregnant Breastfeeding How should I use this medication? This medication is infused into a vein or injected under the skin. Infusions are given by your care team in a hospital or clinic setting. It may be injected under the skin at home. If you get this medication at home, you will be taught how to prepare and give it. Use exactly as directed. Take it as directed on the prescription label. Keep taking it unless your care team tells you to stop. It is important that you put your used needles and syringes in a special sharps container. Do not put them in a trash can. If you do not have a sharps container, call your pharmacist or care team to get one. Talk to your care team about the use of this medication in children. While it may be prescribed for children as young as 2 years for selected conditions, precautions do apply. Overdosage: If you think you have taken too much of this medicine contact a poison control center or  emergency room at once. NOTE: This medicine is only for you. Do not share this medicine with others. What if I miss a dose? If you miss a dose, take it as soon as you can. If it is almost time for your next dose, take only that dose. Do not take double or extra doses. What may interact with this medication? Do not take this medication with any of the following: Live virus vaccines This medication may also interact with the following: Anakinra Baricitinib Canakinumab Medications that lower your chance of fighting an infection Rituximab TNF blockers, such as  adalimumab, certolizumab, etanercept, golimumab, infliximab Tocilizumab Tofacitinib Upadacitinib Ustekinumab This list may not describe all possible interactions. Give your health care provider a list of all the medicines, herbs, non-prescription drugs, or dietary supplements you use. Also tell them if you smoke, drink alcohol, or use illegal drugs. Some items may interact with your medicine. What should I watch for while using this medication? Visit your care team for regular checks on your progress. Tell your care team if your symptoms do not start to get better or if they get worse. You will be tested for tuberculosis (TB) before you start this medication. If your care team prescribed any medication for TB, you should start taking the TB medication before starting this medication. Make sure to finish the full course of TB medication. This medication may increase your risk of getting an infection. Call your care team if you get fever, chills, or sore throat, or other symptoms of a cold or flu. Do not treat yourself. Try to avoid being around people who are sick. If you have diabetes and are getting this medication as an infusion, it may give false high blood sugar readings on the day of your dose. This may happen if you use certain types of blood glucose tests. Your care team may tell you to use a different way to monitor your blood sugar levels. What side effects may I notice from receiving this medication? Side effects that you should report to your care team as soon as possible: Allergic reactions--skin rash, itching, hives, swelling of the face, lips, tongue, or throat Infection--fever, chills, cough, sore throat, wounds that don't heal, pain or trouble when passing urine, general feeling of discomfort or being unwell Side effects that usually do not require medical attention (report to your care team if they continue or are bothersome): Back pain Cough Dizziness Headache Runny or stuffy  nose Sore throat This list may not describe all possible side effects. Call your doctor for medical advice about side effects. You may report side effects to FDA at 1-800-FDA-1088. Where should I keep my medication? If you take this medication at home, keep out of the reach of children and pets. Store in the refrigerator. Keep this medication in the original container. Protect from light. Do not freeze. Do not shake. Get rid of any unused medication after the expiration date. To get rid of medications that are no longer needed or have expired: Take the medication to a medication take-back program. Check with your pharmacy or law enforcement to find a location. If you cannot return the medication, ask your pharmacist or care team how to get rid of the medication safely. NOTE: This sheet is a summary. It may not cover all possible information. If you have questions about this medicine, talk to your doctor, pharmacist, or health care provider.  2024 Elsevier/Gold Standard (2021-07-08 00:00:00)

## 2022-09-14 NOTE — Progress Notes (Signed)
Pharmacy Note  Subjective: Patient presents today to [facility] for follow up office visit.   Patient seen by the pharmacist for counseling on subcutaneous Orencia rheumatoid arthritis.  Prior therapy includes: high-dose prednisone  Objective: CBC    Component Value Date/Time   WBC 10.5 09/10/2022 0359   RBC 3.81 (L) 09/10/2022 0359   HGB 13.3 09/10/2022 0359   HGB 15.5 04/28/2022 1640   HCT 38.3 (L) 09/10/2022 0359   HCT 43.6 04/28/2022 1640   PLT 190 09/10/2022 0359   PLT 221 04/28/2022 1640   MCV 100.5 (H) 09/10/2022 0359   MCV 95 04/28/2022 1640   MCH 34.9 (H) 09/10/2022 0359   MCHC 34.7 09/10/2022 0359   RDW 13.9 09/10/2022 0359   RDW 13.5 04/28/2022 1640   LYMPHSABS 2.1 09/10/2022 0359   LYMPHSABS 3.2 (H) 04/28/2022 1640   MONOABS 0.9 09/10/2022 0359   EOSABS 0.1 09/10/2022 0359   EOSABS 0.2 04/28/2022 1640   BASOSABS 0.1 09/10/2022 0359   BASOSABS 0.1 04/28/2022 1640    CMP     Component Value Date/Time   NA 130 (L) 09/10/2022 0359   NA 137 04/28/2022 1640   K 3.7 09/10/2022 0359   CL 93 (L) 09/10/2022 0359   CO2 27 09/10/2022 0359   GLUCOSE 141 (H) 09/10/2022 0359   BUN 21 (H) 09/10/2022 0359   BUN 11 04/28/2022 1640   CREATININE 0.92 09/10/2022 0359   CREATININE 0.92 02/23/2022 1155   CALCIUM 9.1 09/10/2022 0359   PROT 7.8 09/10/2022 0359   PROT 7.4 04/28/2022 1640   ALBUMIN 3.8 09/10/2022 0359   ALBUMIN 4.8 04/28/2022 1640   AST 24 09/10/2022 0359   ALT 36 09/10/2022 0359   ALKPHOS 39 09/10/2022 0359   BILITOT 1.7 (H) 09/10/2022 0359   BILITOT 0.6 04/28/2022 1640   GFRNONAA >60 09/10/2022 0359   GFRNONAA 83 02/10/2020 1525   GFRAA 96 02/10/2020 1525    Baseline Immunosuppressant Therapy Labs TB GOLD    Latest Ref Rng & Units 02/23/2022   11:55 AM  Quantiferon TB Gold  Quantiferon TB Gold Plus NEGATIVE NEGATIVE    Hepatitis Panel    Latest Ref Rng & Units 02/23/2022   11:55 AM  Hepatitis  Hep B Surface Ag NON-REACTIVE  NON-REACTIVE   Hep B IgM NON-REACTIVE NON-REACTIVE   Hep C Ab NON-REACTIVE NON-REACTIVE    HIV Lab Results  Component Value Date   HIV Non Reactive 09/10/2022   HIV Non Reactive 02/16/2020   Immunoglobulins    Latest Ref Rng & Units 02/23/2022   11:55 AM  Immunoglobulin Electrophoresis  IgA  47 - 310 mg/dL 956   IgG 213 - 0,865 mg/dL 7,846   IgM 50 - 962 mg/dL 952    SPEP    Latest Ref Rng & Units 09/10/2022    3:59 AM  Serum Protein Electrophoresis  Total Protein 6.5 - 8.1 g/dL 7.8    W4XL No results found for: "G6PDH" TPMT No results found for: "TPMT"   Chest x-ray: 02/16/20 "Enlargement of cardiac silhouette without acute infiltrate." Does patient have a diagnosis of COPD? No Does patient have history of diverticulitis?  No  Assessment/Plan:  Counseled patient that Dub Amis is a selective T-cell costimulation blocker.  Counseled patient on purpose, proper use, and adverse effects of Orencia. The most common adverse effects are increased risk of infections, headache, and injection site reactions.  There is the possibility of an increased risk of malignancy but it is not well understood  if this increased risk is due to the medication or the disease state. Reviewed risk of GI perforation which is higher in patients with diverticulitis and diabetes.  Counseled patient that Dub Amis should be held prior to scheduled surgery.  Counseled patient to avoid live vaccines while on Orencia.  Recommend annual influenza, Pneumovax 23, Prevnar 13, and Shingrix as indicated.   Reviewed the importance of regular labs while on Orencia therapy. Patient will be due for labs 1 month after starting therapy. Standing orders placed. Provided patient with medication education material and answered all questions.  Patient consented to [facility]  Will upload consent into patient's chart.  Will apply for Orencia through patient's insurance.  Reviewed storage information for Orencia.  Advised initial injection  must be administered in office.    Patient dose will be Orencia 125 mg every 7 days.  Prescription pending lab results and/or insurance approval. Obtained patient consent form and bridge program application.  Rickey Primus, PharmD Candidate [employer] [phone]

## 2022-09-15 LAB — CULTURE, BLOOD (ROUTINE X 2): Culture: NO GROWTH

## 2022-09-15 NOTE — Telephone Encounter (Signed)
Clinical questions for Orencia SQ completed on CMM  Chesley Mires, PharmD, MPH, BCPS, CPP Clinical Pharmacist (Rheumatology and Pulmonology)

## 2022-09-15 NOTE — Telephone Encounter (Signed)
Submitted a Prior Authorization request to [facility] for [facility] via CoverMyMeds. Pending clinical questions to populate  Key: [facility]  Chesley Mires, PharmD, MPH, BCPS, CPP Clinical Pharmacist (Rheumatology and Pulmonology)

## 2022-09-19 ENCOUNTER — Encounter: Payer: Self-pay | Admitting: *Deleted

## 2022-09-19 ENCOUNTER — Other Ambulatory Visit (HOSPITAL_COMMUNITY): Payer: Self-pay

## 2022-09-19 NOTE — Telephone Encounter (Signed)
Ok to extend work note 

## 2022-09-19 NOTE — Telephone Encounter (Signed)
Received notification from [facility] regarding a prior authorization for ORENCIA SQ. Authorization has been APPROVED from 09/17/22 to 09/17/23. Approval letter sent to scan center.  Unable to run test claim because patient must fill through [facility]: [phone]  Authorization # [id number]  Attempted to enroll patient into copay card but he will have to complete enrollment over the phone: [phone] (available Monday through Friday, 8:00 am to 8:00 pm ET)  MyChart message sent to pt with update and new start visit availability  Chesley Mires, PharmD, MPH, BCPS, CPP Clinical Pharmacist (Rheumatology and Pulmonology)

## 2022-09-19 NOTE — Telephone Encounter (Signed)
FMLA paperwork has been signed.   Ok to extend work note.  It appears that orencia has been approved-please see if the new start visit can be scheduled.

## 2022-09-20 ENCOUNTER — Ambulatory Visit: Payer: BC Managed Care – PPO | Attending: Rheumatology | Admitting: Pharmacist

## 2022-09-20 DIAGNOSIS — Z79899 Other long term (current) drug therapy: Secondary | ICD-10-CM

## 2022-09-20 DIAGNOSIS — Z0279 Encounter for issue of other medical certificate: Secondary | ICD-10-CM

## 2022-09-20 DIAGNOSIS — Z7189 Other specified counseling: Secondary | ICD-10-CM

## 2022-09-20 DIAGNOSIS — M0600 Rheumatoid arthritis without rheumatoid factor, unspecified site: Secondary | ICD-10-CM

## 2022-09-20 MED ORDER — ORENCIA CLICKJECT 125 MG/ML ~~LOC~~ SOAJ
125.0000 mg | SUBCUTANEOUS | 0 refills | Status: DC
Start: 2022-09-20 — End: 2022-12-14

## 2022-09-20 NOTE — Telephone Encounter (Signed)
Patient called today checking on FMLA ,he states has to be sent in by 8/6

## 2022-09-20 NOTE — Progress Notes (Signed)
Pharmacy Note  Subjective:   Patient presents to clinic today to receive first dose of Orencia for Rheumatoid Arthritis. Patient currently takes high dose prednisone (inadequate response). He took 7 days of 50mg  prednisone daily. and is currently on day 3 of 40mg  prednisone daily. Patient appears to be in significantly less pain today than he was at his visit last week.  Patient running a fever or have signs/symptoms of infection? No Patient currently on antibiotics for the treatment of infection? No Patient have any upcoming invasive procedures/surgeries? No Personal or family history of diverticulitis? No  Objective: CMP     Component Value Date/Time   NA 130 (L) 09/10/2022 0359   NA 137 04/28/2022 1640   K 3.7 09/10/2022 0359   CL 93 (L) 09/10/2022 0359   CO2 27 09/10/2022 0359   GLUCOSE 141 (H) 09/10/2022 0359   BUN 21 (H) 09/10/2022 0359   BUN 11 04/28/2022 1640   CREATININE 0.92 09/10/2022 0359   CREATININE 0.92 02/23/2022 1155   CALCIUM 9.1 09/10/2022 0359   PROT 7.8 09/10/2022 0359   PROT 7.4 04/28/2022 1640   ALBUMIN 3.8 09/10/2022 0359   ALBUMIN 4.8 04/28/2022 1640   AST 24 09/10/2022 0359   ALT 36 09/10/2022 0359   ALKPHOS 39 09/10/2022 0359   BILITOT 1.7 (H) 09/10/2022 0359   BILITOT 0.6 04/28/2022 1640   GFRNONAA >60 09/10/2022 0359   GFRNONAA 83 02/10/2020 1525   GFRAA 96 02/10/2020 1525    CBC    Component Value Date/Time   WBC 10.5 09/10/2022 0359   RBC 3.81 (L) 09/10/2022 0359   HGB 13.3 09/10/2022 0359   HGB 15.5 04/28/2022 1640   HCT 38.3 (L) 09/10/2022 0359   HCT 43.6 04/28/2022 1640   PLT 190 09/10/2022 0359   PLT 221 04/28/2022 1640   MCV 100.5 (H) 09/10/2022 0359   MCV 95 04/28/2022 1640   MCH 34.9 (H) 09/10/2022 0359   MCHC 34.7 09/10/2022 0359   RDW 13.9 09/10/2022 0359   RDW 13.5 04/28/2022 1640   LYMPHSABS 2.1 09/10/2022 0359   LYMPHSABS 3.2 (H) 04/28/2022 1640   MONOABS 0.9 09/10/2022 0359   EOSABS 0.1 09/10/2022 0359   EOSABS  0.2 04/28/2022 1640   BASOSABS 0.1 09/10/2022 0359   BASOSABS 0.1 04/28/2022 1640    Baseline Immunosuppressant Therapy Labs TB GOLD    Latest Ref Rng & Units 02/23/2022   11:55 AM  Quantiferon TB Gold  Quantiferon TB Gold Plus NEGATIVE NEGATIVE    Hepatitis Panel    Latest Ref Rng & Units 02/23/2022   11:55 AM  Hepatitis  Hep B Surface Ag NON-REACTIVE NON-REACTIVE   Hep B IgM NON-REACTIVE NON-REACTIVE   Hep C Ab NON-REACTIVE NON-REACTIVE    HIV Lab Results  Component Value Date   HIV Non Reactive 09/10/2022   HIV Non Reactive 02/16/2020   Immunoglobulins    Latest Ref Rng & Units 02/23/2022   11:55 AM  Immunoglobulin Electrophoresis  IgA  47 - 310 mg/dL 098   IgG 119 - 1,478 mg/dL 2,956   IgM 50 - 213 mg/dL 086    SPEP    Latest Ref Rng & Units 09/10/2022    3:59 AM  Serum Protein Electrophoresis  Total Protein 6.5 - 8.1 g/dL 7.8    Chest x-ray 57/84/69: "IMPRESSION: Enlargement of cardiac silhouette without acute infiltrate."  Assessment/Plan:  Therapies unable to try: - MTX - alcohol use and unwilling to quit - leflunomide - alcohol use and unwilling to  quit - JAK inhibitors Harriette Ohara, Olumiant, Rinvoq) - history of PE and CHF and family history of CAD - TNF inhibitors (Enbrel, Humira, Simponi, Cimzia, Remicade) - history of CHF - hydroxychloroquine - would not be as effective - Actemra/Kevzara- elevated LFTs history Reviewed importance of holding Orencia with signs/symptoms of an infections, if antibiotics are prescribed to treat an active infection, and with invasive procedures  Demonstrated proper injection technique with Leward Quan demo device  Patient able to demonstrate proper injection technique using the teach back method.  Patient self injected in the right upper thigh with:  Sample Medication: Orencia Clickject 125mg /mL NDC: [id number] Lot: [id number] Expiration: Sept 2025  Copay Card Information: Member ID: [id number] RxBIN: [id number] RxPCN:  [employer] RxGRP: [id number] Issuer: [phone]  Patient tolerated well.  Observed for 30 mins in office for adverse reaction and patient had no adverse reactions.   Patient is to return in 1 month for labs and 6-8 weeks for follow-up appointment.  Standing orders for CBC/CMP placed.  TB gold will be monitored yearly.   Orencia approved through insurance. Rx sent to: [facility]: [phone].  Patient provided with pharmacy phone number and advised to call later this week to schedule shipment to home.  Patient will continue Orencia 125mg  subcut every 7 days. He will complete prednisone taper as prescribed.  All questions encouraged and answered.  Instructed patient to call with any further questions or concerns.  Rickey Primus, PharmD Candidate [employer] 2025  Chesley Mires, PharmD, MPH, BCPS, CPP Clinical Pharmacist (Rheumatology and Pulmonology)  09/20/2022 8:04 AM

## 2022-09-20 NOTE — Patient Instructions (Signed)
Your next ORENCIA dose is due on 8/6, 8/13, and every 7 days thereafter  CONTINUE prednisone as prescribed  HOLD ORENCIA if you have signs or symptoms of an infection. You can resume once you feel better or back to your baseline. HOLD ORENCIA if you start antibiotics to treat an infection. HOLD ORENCIA around the time of surgery/procedures. Your surgeon will be able to provide recommendations on when to hold BEFORE and when you are cleared to RESUME.  Pharmacy information: Your prescription will be shipped from [facility]. Their phone number is [phone]. Please call to schedule shipment and confirm address. They will mail your medication to your home.  Cost information: Your copay should be affordable. If you call the pharmacy and it is not affordable, please double-check that they are billing through your copay card as secondary coverage. Copay Card Information: Member ID: [id number] RxBIN: [id number] RxPCN: [employer] RxGRP: [id number] Issuer: [phone]  Labs are due in 1 month then every 3 months. Lab hours are from Monday to Thursday 8am-12:30pm and 1pm-5pm and Friday 8am-12pm. You do not need an appointment if you come for labs during these times.  How to manage an injection site reaction: Remember the 5 C's: COUNTER - leave on the counter at least 30 minutes but up to overnight to bring medication to room temperature. This may help prevent stinging COLD - place something cold (like an ice gel pack or cold water bottle) on the injection site just before cleansing with alcohol. This may help reduce pain CLARITIN - use Claritin (generic name is loratadine) for the first two weeks of treatment or the day of, the day before, and the day after injecting. This will help to minimize injection site reactions CORTISONE CREAM - apply if injection site is irritated and itching CALL ME - if injection site reaction is bigger than the size of your fist, looks infected, blisters, or if you  develop hives

## 2022-09-20 NOTE — Telephone Encounter (Signed)
Orencia Copay card: ID: [id number] BIN: [id number] PCN: [employer] GRP: [id number]  Chesley Mires, PharmD, MPH, BCPS, CPP Clinical Pharmacist (Rheumatology and Pulmonology)

## 2022-09-21 NOTE — Telephone Encounter (Signed)
Form is upfront and ready for pick up it has been faxed 09/21/22.

## 2022-10-04 DIAGNOSIS — E349 Endocrine disorder, unspecified: Secondary | ICD-10-CM | POA: Diagnosis not present

## 2022-10-07 ENCOUNTER — Other Ambulatory Visit: Payer: Self-pay | Admitting: Physician Assistant

## 2022-10-09 ENCOUNTER — Other Ambulatory Visit: Payer: Self-pay | Admitting: Physician Assistant

## 2022-10-11 DIAGNOSIS — E349 Endocrine disorder, unspecified: Secondary | ICD-10-CM | POA: Diagnosis not present

## 2022-10-11 DIAGNOSIS — N5201 Erectile dysfunction due to arterial insufficiency: Secondary | ICD-10-CM | POA: Diagnosis not present

## 2022-11-04 ENCOUNTER — Other Ambulatory Visit: Payer: Self-pay | Admitting: Physician Assistant

## 2022-11-04 ENCOUNTER — Telehealth: Payer: Self-pay | Admitting: Physician Assistant

## 2022-11-04 NOTE — Telephone Encounter (Signed)
Last Fill: 08/08/2022  Labs: 09/10/2022 Uric Acid 5.3, RBC 3.81, Hct  38.3, MCV 100.5, MCH 34.9, Sodium 130, Chloride 93, Glucose 141, BUN 21, Total Bilirubin 1.7  Next Visit: Due September 2024. Message sent to the front to schedule.   Last Visit: 09/14/2022  DX: Idiopathic chronic gout of multiple sites without tophus   Current Dose per office note 09/14/2022: Uloric 80 mg daily   Okay to refill Uloric?

## 2022-11-04 NOTE — Telephone Encounter (Signed)
Pt called asking about [place] and an update on that paperwork. Pt states he will be calling them today as well.

## 2022-11-04 NOTE — Telephone Encounter (Signed)
Patient returned call to the office. Patient states Ryan [employer] claims they do not have the information for the intermittent claim. Patient advised to have them fax blank paperwork to the office so we can complete a new form and fax it back to them. Patient expressed understanding.

## 2022-11-04 NOTE — Telephone Encounter (Signed)
Attempted to contact the patient and left message for patient to call the office.

## 2022-11-04 NOTE — Telephone Encounter (Signed)
Please schedule patient a follow up visit. Patient due September 2024. Thanks!

## 2022-11-07 ENCOUNTER — Encounter: Payer: Self-pay | Admitting: *Deleted

## 2022-11-08 ENCOUNTER — Ambulatory Visit (INDEPENDENT_AMBULATORY_CARE_PROVIDER_SITE_OTHER): Payer: BC Managed Care – PPO | Admitting: Family Medicine

## 2022-11-08 VITALS — BP 146/82 | HR 77 | Temp 98.2°F | Ht 74.0 in | Wt 259.8 lb

## 2022-11-08 DIAGNOSIS — F419 Anxiety disorder, unspecified: Secondary | ICD-10-CM | POA: Diagnosis not present

## 2022-11-08 DIAGNOSIS — M0600 Rheumatoid arthritis without rheumatoid factor, unspecified site: Secondary | ICD-10-CM | POA: Diagnosis not present

## 2022-11-08 DIAGNOSIS — I10 Essential (primary) hypertension: Secondary | ICD-10-CM

## 2022-11-08 DIAGNOSIS — I5022 Chronic systolic (congestive) heart failure: Secondary | ICD-10-CM | POA: Diagnosis not present

## 2022-11-08 DIAGNOSIS — E349 Endocrine disorder, unspecified: Secondary | ICD-10-CM | POA: Diagnosis not present

## 2022-11-08 MED ORDER — DULOXETINE HCL 60 MG PO CPEP: 60.0000 mg | ORAL_CAPSULE | Freq: Every day | ORAL | 3 refills | Status: DC

## 2022-11-08 NOTE — Progress Notes (Signed)
Subjective:  Patient ID: Ryan Gibson, male    DOB: 03-19-1975  Age: 47 y.o. MRN: [id number]  CC: Follow-up  HPI:  47 year old male with dilated cardiomyopathy/chronic systolic heart failure (suspect secondary to alcohol), hypogonadism on testosterone, history of PE, rheumatoid arthritis, gout, hyperlipidemia, anxiety/depression presents for follow-up.  Patient is followed by cardiology.  Most recent ejection fraction 35 to 40%.  He is on Entresto, spironolactone, metoprolol, Lasix, Jardiance.  Unsure of compliance.  BP mildly elevated here today.  Discussed patient's alcohol intake.  Advised decrease and subsequent discontinuation of alcohol use given his medical issues.  Patient reports that he is having nausea and vomiting after he takes his Orencia.  Nausea is a common side effect.  He states that he will discuss this with his rheumatologist.  Patient has recently been started on Cymbalta in regards to his pain as well as his mood.  He is interested in increasing the dose.  Patient Active Problem List   Diagnosis Date Noted   Anxiety 11/08/2022   Seronegative rheumatoid arthritis (HCC) 11/08/2022   History of pulmonary embolism 11/17/2021   Chronic systolic heart failure (HCC) 11/17/2021   Hyperlipidemia 06/16/2021   Dilated cardiomyopathy (HCC) 12/16/2020   Family history of early CAD 12/16/2020   Hypogonadism male 11/24/2015   Gout 11/01/2014   Essential hypertension 03/10/2014    Social Hx   Social History   Socioeconomic History   Marital status: Married    Spouse name: Not on file   Number of children: 4   Years of education: Not on file   Highest education level: Not on file  Occupational History   Not on file  Tobacco Use   Smoking status: Never    Passive exposure: Never   Smokeless tobacco: Current    Types: Chew  Vaping Use   Vaping status: Never Used  Substance and Sexual Activity   Alcohol use: Yes    Comment: Social   Drug use: No    Sexual activity: Yes    Birth control/protection: None    Comment: Married  Other Topics Concern   Not on file  Social History Narrative   Married for 13 years.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   Social Determinants of Health   Financial Resource Strain: Patient Declined (09/09/2022)   Overall Financial Resource Strain (CARDIA)    Difficulty of Paying Living Expenses: Patient declined  Food Insecurity: No Food Insecurity (09/10/2022)   Hunger Vital Sign    Worried About Running Out of Food in the Last Year: Never true    Ran Out of Food in the Last Year: Never true  Transportation Needs: No Transportation Needs (09/10/2022)   PRAPARE - Administrator, Civil Service (Medical): No    Lack of Transportation (Non-Medical): No  Physical Activity: Insufficiently Active (09/09/2022)   Exercise Vital Sign    Days of Exercise per Week: 1 day    Minutes of Exercise per Session: 30 min  Stress: Stress Concern Present (09/09/2022)   [employer] of Occupational Health - Occupational Stress Questionnaire    Feeling of Stress : To some extent  Social Connections: Socially Integrated (09/09/2022)   Social Connection and Isolation Panel [NHANES]    Frequency of Communication with Friends and Family: More than three times a week    Frequency of Social Gatherings with Friends and Family: Once a week    Attends Religious Services: More than 4 times per year    Active  Member of Clubs or Organizations: Yes    Attends Engineer, structural: More than 4 times per year    Marital Status: Married    Review of Systems Per HPI  Objective:  BP (!) 146/82   Pulse 77   Temp 98.2 F (36.8 C)   Ht 6\' 2"  (1.88 m)   Wt 259 lb 12.8 oz (117.8 kg)   SpO2 97%   BMI 33.36 kg/m      11/08/2022   10:55 AM 09/14/2022    9:04 AM 09/14/2022    9:03 AM  BP/Weight  Systolic BP 146 182 194  Diastolic BP 82 144 148  Wt. (Lbs) 259.8    BMI 33.36 kg/m2       Physical Exam Vitals reviewed.  Constitutional:      General: He is not in acute distress. HENT:     Head: Normocephalic and atraumatic.  Cardiovascular:     Rate and Rhythm: Normal rate and regular rhythm.  Pulmonary:     Effort: Pulmonary effort is normal.     Breath sounds: Normal breath sounds. No wheezing or rales.  Neurological:     Mental Status: He is alert.  Psychiatric:     Comments: Anxious.     Lab Results  Component Value Date   WBC 10.5 09/10/2022   HGB 13.3 09/10/2022   HCT 38.3 (L) 09/10/2022   PLT 190 09/10/2022   GLUCOSE 141 (H) 09/10/2022   CHOL 160 07/23/2020   TRIG 190 (H) 07/23/2020   HDL 50 07/23/2020   LDLDIRECT 82 07/23/2020   LDLCALC 78 07/23/2020   ALT 36 09/10/2022   AST 24 09/10/2022   NA 130 (L) 09/10/2022   K 3.7 09/10/2022   CL 93 (L) 09/10/2022   CREATININE 0.92 09/10/2022   BUN 21 (H) 09/10/2022   CO2 27 09/10/2022   TSH 2.520 07/23/2020   INR 1.4 (H) 02/16/2020   HGBA1C 4.7 (L) 09/10/2022     Assessment & Plan:   Problem List Items Addressed This Visit       Cardiovascular and Mediastinum   Essential hypertension    BP mildly elevated today.  Continue current medications.       Chronic systolic heart failure (HCC) - Primary    Advised to cut back and discontinue alcohol use.  Continue follow-up with cardiology.  Needs to be compliant with medications.        Musculoskeletal and Integument   Seronegative rheumatoid arthritis (HCC)    Having adverse effect from Orencia.  Advised to discuss with rheumatology.        Other   Anxiety    Using Cymbalta for pain as well as mood.  Increasing to 60 mg daily.      Relevant Medications   DULoxetine (CYMBALTA) 60 MG capsule    Meds ordered this encounter  Medications   DULoxetine (CYMBALTA) 60 MG capsule    Sig: Take 1 capsule (60 mg total) by mouth daily.    Dispense:  90 capsule    Refill:  3    Follow-up:  3 months  Taiylor Virden Adriana Simas DO [facility]

## 2022-11-08 NOTE — Assessment & Plan Note (Signed)
Advised to cut back and discontinue alcohol use.  Continue follow-up with cardiology.  Needs to be compliant with medications.

## 2022-11-08 NOTE — Assessment & Plan Note (Signed)
Using Cymbalta for pain as well as mood.  Increasing to 60 mg daily.

## 2022-11-08 NOTE — Assessment & Plan Note (Signed)
BP mildly elevated today. Continue current medications.

## 2022-11-08 NOTE — Patient Instructions (Signed)
Cymbalta increased.  Follow up in 3 months.

## 2022-11-08 NOTE — Assessment & Plan Note (Signed)
Having adverse effect from Orencia.  Advised to discuss with rheumatology.

## 2022-11-09 ENCOUNTER — Telehealth: Payer: Self-pay | Admitting: *Deleted

## 2022-11-09 NOTE — Telephone Encounter (Signed)
Attempted to contact the patient and left message to advise patient we have completed and faxed his FMLA paperwork. Advised patient his copy is at the front desk for pick up.

## 2022-11-16 ENCOUNTER — Ambulatory Visit: Payer: BC Managed Care – PPO | Admitting: Cardiology

## 2022-11-17 NOTE — Telephone Encounter (Signed)
We can make the change for 6 months until his rheumatoid arthritis gets better controlled.

## 2022-11-21 NOTE — Progress Notes (Unsigned)
Office Visit Note  Patient: Ryan Gibson             Date of Birth: 10-25-1975           MRN: [id number]             PCP: Ryan Sams, DO Referring: Ryan Sams, DO Visit Date: 12/01/2022 Occupation: @GUAROCC @  Subjective:  Medication monitoring   History of Present Illness: Ryan Gibson is a 47 y.o. male with history of seronegative rheumatoid arthritis and gout.  Patient was initiated on Orencia 125 mg sq injections once weekly on 09/20/22.  Patient states for the past 1 month he has been spacing Orencia to every other week due to experiencing profound fatigue after each injection.  He states the fatigue would last 3-4 days after each injection.  He states he has noticed clinical improvement since initiating orencia.  Patient states that since spacing the dosing of Orencia he had a flare involving the left knee.  He states he took prednisone 40 mg for the past 2 days but has not yet taken it today.  He denies any joint pain or joint swelling at this time.   He has been tolerating Cymbalta 60 mg at bedtime.  He has noticed improvements in his mood. He denies any recent or recurrent infections.    Activities of Daily Living:  Patient reports morning stiffness for several hours.   Patient Denies nocturnal pain.  Difficulty dressing/grooming: Denies Difficulty climbing stairs: Denies Difficulty getting out of chair: Denies Difficulty using hands for taps, buttons, cutlery, and/or writing: Denies  Review of Systems  Constitutional:  Positive for fatigue.  HENT:  Negative for mouth sores and mouth dryness.   Eyes:  Positive for dryness.  Respiratory:  Negative for shortness of breath.   Cardiovascular:  Positive for palpitations. Negative for chest pain.  Gastrointestinal:  Negative for blood in stool, constipation and diarrhea.  Endocrine: Negative for increased urination.  Genitourinary:  Negative for involuntary urination.  Musculoskeletal:  Positive for morning  stiffness. Negative for joint pain, gait problem, joint pain, joint swelling, myalgias, muscle weakness, muscle tenderness and myalgias.  Skin:  Negative for color change, rash, hair loss and sensitivity to sunlight.  Allergic/Immunologic: Negative for susceptible to infections.  Neurological:  Positive for headaches. Negative for dizziness.  Hematological:  Negative for swollen glands.  Psychiatric/Behavioral:  Negative for depressed mood and sleep disturbance. The patient is not nervous/anxious.     PMFS History:  Patient Active Problem List   Diagnosis Date Noted   Anxiety 11/08/2022   Seronegative rheumatoid arthritis (HCC) 11/08/2022   History of pulmonary embolism 11/17/2021   Chronic systolic heart failure (HCC) 11/17/2021   Hyperlipidemia 06/16/2021   Dilated cardiomyopathy (HCC) 12/16/2020   Family history of early CAD 12/16/2020   Hypogonadism male 11/24/2015   Gout 11/01/2014   Essential hypertension 03/10/2014    Past Medical History:  Diagnosis Date   Asthma    AS A CHILD ONLY   DVT (deep venous thrombosis) (HCC)    Foot pain, right    painful x 1 month and swelling   Idiopathic gout     Family History  Problem Relation Age of Onset   Healthy Mother    Heart disease Father    Past Surgical History:  Procedure Laterality Date   HERNIA REPAIR  07/13/2011   [facility]   INGUINAL HERNIA REPAIR  07/13/2011   Procedure: HERNIA REPAIR INGUINAL ADULT;  Surgeon: Wilmon Arms. Tsuei,  MD;  Location: [facility];  Service: General;  Laterality: Right;   Social History   Social History Narrative   Married for 13 years.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   Immunization History  Administered Date(s) Administered   Tdap 03/07/2018     Objective: Vital Signs: BP 130/88 (BP Location: Left Arm, Patient Position: Sitting, Cuff Size: Normal)   Pulse 78   Resp 17   Ht 6\' 2"  (1.88 m)   Wt 249 lb (112.9 kg)   BMI 31.97 kg/m    Physical Exam Vitals  and nursing note reviewed.  Constitutional:      Appearance: He is well-developed.  HENT:     Head: Normocephalic and atraumatic.  Eyes:     Conjunctiva/sclera: Conjunctivae normal.     Pupils: Pupils are equal, round, and reactive to light.  Cardiovascular:     Rate and Rhythm: Normal rate and regular rhythm.     Heart sounds: Normal heart sounds.  Pulmonary:     Effort: Pulmonary effort is normal.     Breath sounds: Normal breath sounds.  Abdominal:     General: Bowel sounds are normal.     Palpations: Abdomen is soft.  Musculoskeletal:     Cervical back: Normal range of motion and neck supple.  Skin:    General: Skin is warm and dry.     Capillary Refill: Capillary refill takes less than 2 seconds.  Neurological:     Mental Status: He is alert and oriented to person, place, and time.  Psychiatric:        Behavior: Behavior normal.      Musculoskeletal Exam: C-spine, thoracic spine, and lumbar spine good ROM.  Shoulder joints, elbow joints, wrist joints, MCPs, PIPs, and DIPs good ROM with no synovitis.    Thickening of left olecranon bursa.  Hip joints have good ROM with no groin pain.  Knee joints have good range of motion with no warmth or effusion today.  Ankle joints have good range of motion with no tenderness or joint swelling.  CDAI Exam: CDAI Score: -- Patient Global: 0 / 100; Provider Global: 0 / 100 Swollen: --; Tender: -- Joint Exam 12/01/2022   No joint exam has been documented for this visit   There is currently no information documented on the homunculus. Go to the Rheumatology activity and complete the homunculus joint exam.  Investigation: No additional findings.  Imaging: No results found.  Recent Labs: Lab Results  Component Value Date   WBC 10.5 09/10/2022   HGB 13.3 09/10/2022   PLT 190 09/10/2022   NA 130 (L) 09/10/2022   K 3.7 09/10/2022   CL 93 (L) 09/10/2022   CO2 27 09/10/2022   GLUCOSE 141 (H) 09/10/2022   BUN 21 (H) 09/10/2022    CREATININE 0.92 09/10/2022   BILITOT 1.7 (H) 09/10/2022   ALKPHOS 39 09/10/2022   AST 24 09/10/2022   ALT 36 09/10/2022   PROT 7.8 09/10/2022   ALBUMIN 3.8 09/10/2022   CALCIUM 9.1 09/10/2022   GFRAA 96 02/10/2020   QFTBGOLDPLUS NEGATIVE 02/23/2022    Speciality Comments: No specialty comments available.  Procedures:  No procedures performed Allergies: Vancomycin   Assessment / Plan:     Visit Diagnoses: Seronegative rheumatoid arthritis (HCC) - RF-, Anti-CCP-, elevated ESR and CRP, hx of achilles tendonitis, recurrent joint swelling/knee effusions, possible dactylitis of right 2nd toe: He has no synovitis on examination today.  He is currently prescribed Orencia 125 mg subcutaneous injections  once weekly which was initiated on 09/20/2022.  Patient states he has been experiencing profound fatigue after each Orencia injection.  He states for the past 1 month he has been spacing Orencia to every other week due to the fatigue.  He has noticed a significant improvement in his joint pain and inflammation since initiating Orencia but while spacing the dosing he has had a flare involving the left knee.  Patient states that for the past 2 days he is taking prednisone 40 mg which has resolved his symptoms. Dr. Corliss Skains examined the patient today and recommended switching to IV orencia due to less frequent dosing.  He will notify us if he continues to have significant fatigue or any other side effects.  His treatment options are somewhat limited given history of systolic heart failure, elevated LFTs, and history of PE and DVT.  Could consider Actemra or Kevzara with close lab monitoring (lipid panel and LFTs) in the future if needed. Patient was in agreement to switch to IV Orencia infusions once monthly.  Plan to apply for IV Orencia through his insurance.  He will follow up in 2 months or sooner if needed.   High risk medication use - IV Orencia infusion every 4 weeks.  Discontinue sq orencia due to  fatigue on weekly basis.   CBC and CMP updated today.  His next lab work will be due in January and every 3 months.  TB Gold negative on 02/23/2022.  Baseline immunosuppressive labs obtained on 02/23/2022. Chest x-ray 02/16/2020. He is not a good candidate for TNF inhibitors given history of systolic heart failure.   Not a good candidate for methotrexate or Arava due to alcohol use and elevated LFTs.  Not a good candidate for Jak inhibitors given history of PE and DVT.  - Plan: COMPLETE METABOLIC PANEL WITH GFR, CBC with Differential/Platelet  Idiopathic chronic gout of multiple sites without tophus - Crystal proven gout dx [place] [facility]. Treated with allopurinol initially which was discontinued due to GI side effects: Patient remains on Uloric 80 mg daily and colchicine 0.6 mg 1 tablet by mouth daily.  He has not had any signs or symptoms of a gout flare.  Medication monitoring encounter - Uloric 80 mg 1 tablet by mouth daily and colchicine 0.6 mg 1 tablet by mouth daily. Uric acid was 5.3 on 09/10/2022.  Pain in left elbow: Not currently symptomatic.  Pain in both wrists: No synovitis noted today.  Chronic pain of left knee: Recent flare.  He took prednisone 40 mg on Tuesday and Wednesday of this week.  No warmth or effusion noted on exam.  Effusion, right knee: No warmth or effusion noted on examination today.  Chronic pain of both ankles: No tenderness or synovitis.  Pain in both feet: He is not experiencing any increased discomfort in his feet at this time.  Positive ANA (antinuclear antibody): ANA is low titer positive and most likely not significant. No clinical features of systemic lupus.   Achilles tendinitis of left lower extremity: Not currently symptomatic.   Vitamin D deficiency -vitamin D will be checked today.  Plan: VITAMIN D 25 Hydroxy (Vit-D Deficiency, Fractures)  Other fatigue - He has been experiencing increased fatigue.  Plan to check the following lab work today.  Plan:  TSH, VITAMIN D 25 Hydroxy (Vit-D Deficiency, Fractures)  Other medical conditions are listed as follows:   Tinea pedis of both feet  Depression, major, single episode, moderate (HCC)  History of pulmonary embolism  Chronic systolic  heart failure (HCC)  Dilated cardiomyopathy (HCC)  Essential hypertension  History of hyperlipidemia  Hypogonadism male   Orders: Orders Placed This Encounter  Procedures   COMPLETE METABOLIC PANEL WITH GFR   CBC with Differential/Platelet   TSH   VITAMIN D 25 Hydroxy (Vit-D Deficiency, Fractures)   No orders of the defined types were placed in this encounter.   Follow-Up Instructions: Return in about 2 months (around 01/31/2023) for Rheumatoid arthritis, Gout.   Gearldine Bienenstock, PA-C  Note - This record has been created using Dragon software.  Chart creation errors have been sought, but may not always  have been located. Such creation errors do not reflect on  the standard of medical care.

## 2022-11-22 NOTE — Progress Notes (Unsigned)
Cardiology Office Note:  .   Date:  11/23/2022  ID:  Ryan Gibson, DOB [birth year], MRN [id number] PCP: Tommie Sams, DO  [facility] Providers Cardiologist:  None {   History of Present Illness: .   Ryan Gibson is a 47 y.o. male with a past medical history of hypertension, tobacco use disorder, alcohol abuse, strong family history of premature coronary artery disease his father had coronary artery disease in his 33s here for follow-up appointment.  Patient presented to [facility] in 01/2020 where he was diagnosed with acute bilateral pulmonary emboli, DVT with right lower extremity and severe dilated cardiomyopathy with LVEF less than 20% as well as acute cholecystitis.  Suspected DVT and PE are related to underlying COVID-19 infection.  Cardiomyopathy likely related to alcohol abuse.  Patient wants initiated on guideline directed medical therapy including Jardiance, metoprolol, and Entresto.  Since admission 12/21 patient has reduced alcohol intake and echocardiogram was repeated 06/2021 which revealed LVEF improved to 40 to 45%.  Patient was seen in the office 03/23/2021 at which time spironolactone 12.5 mg was added.  Patient subsequently increased mental acting on to 25 mg daily.  Currently taking furosemide 40 mg daily.  Had recent labs done and BMP remained stable.  Patient is overall feeling very well without any specific complaints.  Denies chest pain, orthopnea, dyspnea, leg edema.  Continue to reduce alcohol intake and was drinking 1-2 beers per week.  This was 08/04/2021.  Today, he tells me that he turned in paperwork for patient assistance a year ago with Dr. Verl Dicker office and never heard anything back.  He has been living off of samples since January and he is intermittently been taking his medications.  Today blood pressure is 178/120.  I told him that I should be sending him to the ER but he refused to go.  Out of all his medications.  Did not take anything this  morning.  Heart rate is in the 90s.  We sent refills of Lasix, metoprolol, and spironolactone today and we have given him coupons for his Eliquis, Jardiance, and Entresto.  Unfortunately, we do not have any samples available in the office of these medications.  I reach out to social work to follow-up with him.  He stopped taking his Crestor because it "made him feel bad".  He is now doing testosterone supplementation for fatigue.  However, we discussed that his fatigue could very well be multifactorial.  He is on prednisone for his rheumatoid arthritis  I also sent in a prescription for valsartan 160 mg daily just in case he is unable to obtain Entresto in the next few days.  Obviously, would prefer Entresto but patient does need blood pressure medication at all times.  This was emphasized to him.  ROS: Pertinent ROS in HPI  Studies Reviewed: .        Echocardiogram 03/01/2022:  Left ventricle cavity is mildly dilated. Normal left ventricular wall  thickness. Hypokinetic global wall motion. Abnormal septal wall motion due  to left bundle branch block. Normal diastolic filling pattern. Moderately  depressed LV systolic function with visual EF 35-40%. Calculated EF 43%.  The aortic root is mildly dilated at 4.2 cm at sinus of valsalva.  Ascending aorta is normal in size. .  Compared to the study done on 07/13/2021, no significant change, mild  aortic root dilatation at sinus of Valsalva is new.       Physical Exam:   VS:  BP (!) 178/120  Pulse 96   Ht 6' (1.829 m)   Wt 255 lb (115.7 kg)   SpO2 98%   BMI 34.58 kg/m    Wt Readings from Last 3 Encounters:  11/23/22 255 lb (115.7 kg)  11/08/22 259 lb 12.8 oz (117.8 kg)  09/14/22 261 lb (118.4 kg)    GEN: Well nourished, well developed in no acute distress NECK: No JVD; No carotid bruits CARDIAC: RRR, no murmurs, rubs, gallops RESPIRATORY:  Clear to auscultation without rales, wheezing or rhonchi  ABDOMEN: Soft, non-tender,  non-distended EXTREMITIES:  No edema; No deformity   ASSESSMENT AND PLAN: .   1.  Chronic systolic heart failure -He was not taking any medications during our visit today -He ran out of all his prescriptions and has been fighting for patient assistance since January on Entresto, Jardiance, and Eliquis -I reach out to pharmacy and social work today and we have provided him with coupons for these medications -Unfortunately, we did not have any samples in the office today -Medication compliance was strongly enforced today  2.  Hypertension -Blood pressure 178/120 -Would have liked him to go to the ER with his diastolic that elevated but patient declined -We have sent an emergency valsartan prescription in case he is unable to start his Entresto today -We have also refilled his metoprolol succinate, Lasix, and spironolactone  3.  Acute pulmonary embolism -He has been on Eliquis intermittently due to cost -We provided him with a coupon today and highly encouraged that he maintains compliance      Dispo: He can follow-up in 2 weeks for blood pressure check with the nurse and in 2 months with me or another APP  Signed, Sharlene Dory, PA-C

## 2022-11-23 ENCOUNTER — Ambulatory Visit: Payer: BC Managed Care – PPO | Attending: Internal Medicine | Admitting: Physician Assistant

## 2022-11-23 ENCOUNTER — Encounter (HOSPITAL_BASED_OUTPATIENT_CLINIC_OR_DEPARTMENT_OTHER): Payer: Self-pay

## 2022-11-23 ENCOUNTER — Encounter: Payer: Self-pay | Admitting: Physician Assistant

## 2022-11-23 VITALS — BP 178/120 | HR 96 | Ht 72.0 in | Wt 255.0 lb

## 2022-11-23 DIAGNOSIS — I10 Essential (primary) hypertension: Secondary | ICD-10-CM | POA: Diagnosis not present

## 2022-11-23 DIAGNOSIS — Z86711 Personal history of pulmonary embolism: Secondary | ICD-10-CM

## 2022-11-23 DIAGNOSIS — I5022 Chronic systolic (congestive) heart failure: Secondary | ICD-10-CM

## 2022-11-23 MED ORDER — VALSARTAN 160 MG PO TABS: 160.0000 mg | ORAL_TABLET | Freq: Every day | ORAL | 3 refills | Status: DC

## 2022-11-23 MED ORDER — METOPROLOL SUCCINATE ER 100 MG PO TB24: 150.0000 mg | ORAL_TABLET | Freq: Every day | ORAL | 3 refills | Status: DC

## 2022-11-23 MED ORDER — EMPAGLIFLOZIN 10 MG PO TABS: 10.0000 mg | ORAL_TABLET | Freq: Every day | ORAL | 3 refills | Status: DC

## 2022-11-23 MED ORDER — SPIRONOLACTONE 25 MG PO TABS: 25.0000 mg | ORAL_TABLET | Freq: Every day | ORAL | 3 refills | Status: DC

## 2022-11-23 MED ORDER — ENTRESTO 49-51 MG PO TABS: 1.0000 | ORAL_TABLET | Freq: Two times a day (BID) | ORAL | 3 refills | Status: DC

## 2022-11-23 MED ORDER — ELIQUIS 5 MG PO TABS: 5.0000 mg | ORAL_TABLET | Freq: Two times a day (BID) | ORAL | 3 refills | Status: DC

## 2022-11-23 MED ORDER — FUROSEMIDE 40 MG PO TABS: 40.0000 mg | ORAL_TABLET | Freq: Every day | ORAL | 1 refills | Status: DC | PRN

## 2022-11-23 NOTE — Patient Instructions (Addendum)
Medication Instructions:   START TAKING:  VALSARATAN 160 MG ONCE A DAY    *If you need a refill on your cardiac medications before your next appointment, please call your pharmacy*   Lab Work:  NONE ORDERED  TODAY   If you have labs (blood work) drawn today and your tests are completely normal, you will receive your results only by: MyChart Message (if you have MyChart) OR A paper copy in the mail If you have any lab test that is abnormal or we need to change your treatment, we will call you to review the results.   Testing/Procedures: NONE ORDERED  TODAY     Follow-Up: At [facility], you and your health needs are our priority.  As part of our continuing mission to provide you with exceptional heart care, we have created designated Provider Care Teams.  These Care Teams include your primary Cardiologist (physician) and Advanced Practice Providers (APPs -  Physician Assistants and Nurse Practitioners) who all work together to provide you with the care you need, when you need it.  We recommend signing up for the patient portal called "MyChart".  Sign up information is provided on this After Visit Summary.  MyChart is used to connect with patients for Virtual Visits (Telemedicine).  Patients are able to view lab/test results, encounter notes, upcoming appointments, etc.  Non-urgent messages can be sent to your provider as well.   To learn more about what you can do with MyChart, go to [email or web address].    Your next appointment:  2 WEEKS NURSE VISIT BLOOD PRESSURE CHECK  2 month(s)  Provider:     Jari Favre, PA-C /APP      Other Instructions  Heart-Healthy Eating Plan Eating a healthy diet is important for the health of your heart. A heart-healthy eating plan includes: Eating less unhealthy fats. Eating more healthy fats. Eating less salt in your food. Salt is also called sodium. Making other changes in your diet. Talk with your doctor or a diet specialist  (dietitian) to create an eating plan that is right for you. What is my plan? Your doctor may recommend an eating plan that includes: Total fat: ______% or less of total calories a day. Saturated fat: ______% or less of total calories a day. Cholesterol: less than _________mg a day. Sodium: less than _________mg a day. What are tips for following this plan? Cooking Avoid frying your food. Try to bake, boil, grill, or broil it instead. You can also reduce fat by: Removing the skin from poultry. Removing all visible fats from meats. Steaming vegetables in water or broth. Meal planning  At meals, divide your plate into four equal parts: Fill one-half of your plate with vegetables and green salads. Fill one-fourth of your plate with whole grains. Fill one-fourth of your plate with lean protein foods. Eat 2-4 cups of vegetables per day. One cup of vegetables is: 1 cup (91 g) broccoli or cauliflower florets. 2 medium carrots. 1 large bell pepper. 1 large sweet potato. 1 large tomato. 1 medium white potato. 2 cups (150 g) raw leafy greens. Eat 1-2 cups of fruit per day. One cup of fruit is: 1 small apple 1 large banana 1 cup (237 g) mixed fruit, 1 large orange,  cup (82 g) dried fruit, 1 cup (240 mL) 100% fruit juice. Eat more foods that have soluble fiber. These are apples, broccoli, carrots, beans, peas, and barley. Try to get 20-30 g of fiber per day. Eat 4-5 servings of nuts,  legumes, and seeds per week: 1 serving of dried beans or legumes equals  cup (90 g) cooked. 1 serving of nuts is  oz (12 almonds, 24 pistachios, or 7 walnut halves). 1 serving of seeds equals  oz (8 g). General information Eat more home-cooked food. Eat less restaurant, buffet, and fast food. Limit or avoid alcohol. Limit foods that are high in starch and sugar. Avoid fried foods. Lose weight if you are overweight. Keep track of how much salt (sodium) you eat. This is important if you have high  blood pressure. Ask your doctor to tell you more about this. Try to add vegetarian meals each week. Fats Choose healthy fats. These include olive oil and canola oil, flaxseeds, walnuts, almonds, and seeds. Eat more omega-3 fats. These include salmon, mackerel, sardines, tuna, flaxseed oil, and ground flaxseeds. Try to eat fish at least 2 times each week. Check food labels. Avoid foods with trans fats or high amounts of saturated fat. Limit saturated fats. These are often found in animal products, such as meats, butter, and cream. These are also found in plant foods, such as palm oil, palm kernel oil, and coconut oil. Avoid foods with partially hydrogenated oils in them. These have trans fats. Examples are stick margarine, some tub margarines, cookies, crackers, and other baked goods. What foods should I eat? Fruits All fresh, canned (in natural juice), or frozen fruits. Vegetables Fresh or frozen vegetables (raw, steamed, roasted, or grilled). Green salads. Grains Most grains. Choose whole wheat and whole grains most of the time. Rice and pasta, including brown rice and pastas made with whole wheat. Meats and other proteins Lean, well-trimmed beef, veal, pork, and lamb. Chicken and [place] without skin. All fish and shellfish. Wild duck, rabbit, pheasant, and venison. Egg whites or low-cholesterol egg substitutes. Dried beans, peas, lentils, and tofu. Seeds and most nuts. Dairy Low-fat or nonfat cheeses, including ricotta and mozzarella. Skim or 1% milk that is liquid, powdered, or evaporated. Buttermilk that is made with low-fat milk. Nonfat or low-fat yogurt. Fats and oils Non-hydrogenated (trans-free) margarines. Vegetable oils, including soybean, sesame, sunflower, olive, peanut, safflower, corn, canola, and cottonseed. Salad dressings or mayonnaise made with a vegetable oil. Beverages Mineral water. Coffee and tea. Diet carbonated beverages. Sweets and desserts Sherbet, gelatin, and  fruit ice. Small amounts of dark chocolate. Limit all sweets and desserts. Seasonings and condiments All seasonings and condiments. The items listed above may not be a complete list of foods and drinks you can eat. Contact a dietitian for more options. What foods should I avoid? Fruits Canned fruit in heavy syrup. Fruit in cream or butter sauce. Fried fruit. Limit coconut. Vegetables Vegetables cooked in cheese, cream, or butter sauce. Fried vegetables. Grains Breads that are made with saturated or trans fats, oils, or whole milk. Croissants. Sweet rolls. Donuts. High-fat crackers, such as cheese crackers. Meats and other proteins Fatty meats, such as hot dogs, ribs, sausage, bacon, rib-eye roast or steak. High-fat deli meats, such as salami and bologna. Caviar. Domestic duck and goose. Organ meats, such as liver. Dairy Cream, sour cream, cream cheese, and creamed cottage cheese. Whole-milk cheeses. Whole or 2% milk that is liquid, evaporated, or condensed. Whole buttermilk. Cream sauce or high-fat cheese sauce. Yogurt that is made from whole milk. Fats and oils Meat fat, or shortening. Cocoa butter, hydrogenated oils, palm oil, coconut oil, palm kernel oil. Solid fats and shortenings, including bacon fat, salt pork, lard, and butter. Nondairy cream substitutes. Salad dressings with cheese  or sour cream. Beverages Regular sodas and juice drinks with added sugar. Sweets and desserts Frosting. Pudding. Cookies. Cakes. Pies. Milk chocolate or white chocolate. Buttered syrups. Full-fat ice cream or ice cream drinks. The items listed above may not be a complete list of foods and drinks to avoid. Contact a dietitian for more information. Summary Heart-healthy meal planning includes eating less unhealthy fats, eating more healthy fats, and making other changes in your diet. Eat a balanced diet. This includes fruits and vegetables, low-fat or nonfat dairy, lean protein, nuts and legumes, whole  grains, and heart-healthy oils and fats. This information is not intended to replace advice given to you by your health care provider. Make sure you discuss any questions you have with your health care provider. Document Revised: 03/15/2021 Document Reviewed: 03/15/2021 Elsevier Patient Education  2024 Elsevier Inc.  Low-Sodium Eating Plan Salt (sodium) helps you keep a healthy balance of fluids in your body. Too much sodium can raise your blood pressure. It can also cause fluid and waste to be held in your body. Your health care provider or dietitian may recommend a low-sodium eating plan if you have high blood pressure (hypertension), kidney disease, liver disease, or heart failure. Eating less sodium can help lower your blood pressure and reduce swelling. It can also protect your heart, liver, and kidneys. What are tips for following this plan? Reading food labels  Check food labels for the amount of sodium per serving. If you eat more than one serving, you must multiply the listed amount by the number of servings. Choose foods with less than 140 milligrams (mg) of sodium per serving. Avoid foods with 300 mg of sodium or more per serving. Always check how much sodium is in a product, even if the label says "unsalted" or "no salt added." Shopping  Buy products labeled as "low-sodium" or "no salt added." Buy fresh foods. Avoid canned foods and pre-made or frozen meals. Avoid canned, cured, or processed meats. Buy breads that have less than 80 mg of sodium per slice. Cooking  Eat more home-cooked food. Try to eat less restaurant, buffet, and fast food. Try not to add salt when you cook. Use salt-free seasonings or herbs instead of table salt or sea salt. Check with your provider or pharmacist before using salt substitutes. Cook with plant-based oils, such as canola, sunflower, or olive oil. Meal planning When eating at a restaurant, ask if your food can be made with less salt or no salt.  Avoid dishes labeled as brined, pickled, cured, or smoked. Avoid dishes made with soy sauce, miso, or teriyaki sauce. Avoid foods that have monosodium glutamate (MSG) in them. MSG may be added to some restaurant food, sauces, soups, bouillon, and canned foods. Make meals that can be grilled, baked, poached, roasted, or steamed. These are often made with less sodium. General information Try to limit your sodium intake to 1,500-2,300 mg each day, or the amount told by your provider. What foods should I eat? Fruits Fresh, frozen, or canned fruit. Fruit juice. Vegetables Fresh or frozen vegetables. "No salt added" canned vegetables. "No salt added" tomato sauce and paste. Low-sodium or reduced-sodium tomato and vegetable juice. Grains Low-sodium cereals, such as oats, puffed wheat and rice, and shredded wheat. Low-sodium crackers. Unsalted rice. Unsalted pasta. Low-sodium bread. Whole grain breads and whole grain pasta. Meats and other proteins Fresh or frozen meat, poultry, seafood, and fish. These should have no added salt. Low-sodium canned tuna and salmon. Unsalted nuts. Dried peas, beans,  and lentils without added salt. Unsalted canned beans. Eggs. Unsalted nut butters. Dairy Milk. Soy milk. Cheese that is naturally low in sodium, such as ricotta cheese, fresh mozzarella, or Swiss cheese. Low-sodium or reduced-sodium cheese. Cream cheese. Yogurt. Seasonings and condiments Fresh and dried herbs and spices. Salt-free seasonings. Low-sodium mustard and ketchup. Sodium-free salad dressing. Sodium-free light mayonnaise. Fresh or refrigerated horseradish. Lemon juice. Vinegar. Other foods Homemade, reduced-sodium, or low-sodium soups. Unsalted popcorn and pretzels. Low-salt or salt-free chips. The items listed above may not be all the foods and drinks you can have. Talk to a dietitian to learn more. What foods should I avoid? Vegetables Sauerkraut, pickled vegetables, and relishes. Olives. [place]  fries. Onion rings. Regular canned vegetables, except low-sodium or reduced-sodium items. Regular canned tomato sauce and paste. Regular tomato and vegetable juice. Frozen vegetables in sauces. Grains Instant hot cereals. Bread stuffing, pancake, and biscuit mixes. Croutons. Seasoned rice or pasta mixes. Noodle soup cups. Boxed or frozen macaroni and cheese. Regular salted crackers. Self-rising flour. Meats and other proteins Meat or fish that is salted, canned, smoked, spiced, or pickled. Precooked or cured meat, such as sausages or meat loaves. Tomasa Blase. Ham. Pepperoni. Hot dogs. Corned beef. Chipped beef. Salt pork. Jerky. Pickled herring, anchovies, and sardines. Regular canned tuna. Salted nuts. Dairy Processed cheese and cheese spreads. Hard cheeses. Cheese curds. Blue cheese. Feta cheese. String cheese. Regular cottage cheese. Buttermilk. Canned milk. Fats and oils Salted butter. Regular margarine. Ghee. Bacon fat. Seasonings and condiments Onion salt, garlic salt, seasoned salt, table salt, and sea salt. Canned and packaged gravies. Worcestershire sauce. Tartar sauce. Barbecue sauce. Teriyaki sauce. Soy sauce, including reduced-sodium soy sauce. Steak sauce. Fish sauce. Oyster sauce. Cocktail sauce. Horseradish that you find on the shelf. Regular ketchup and mustard. Meat flavorings and tenderizers. Bouillon cubes. Hot sauce. Pre-made or packaged marinades. Pre-made or packaged taco seasonings. Relishes. Regular salad dressings. Salsa. Other foods Salted popcorn and pretzels. Corn chips and puffs. Potato and tortilla chips. Canned or dried soups. Pizza. Frozen entrees and pot pies. The items listed above may not be all the foods and drinks you should avoid. Talk to a dietitian to learn more. This information is not intended to replace advice given to you by your health care provider. Make sure you discuss any questions you have with your health care provider. Document Revised: 02/24/2022 Document  Reviewed: 02/24/2022 Elsevier Patient Education  2024 [employer].

## 2022-12-01 ENCOUNTER — Encounter: Payer: Self-pay | Admitting: Physician Assistant

## 2022-12-01 ENCOUNTER — Ambulatory Visit: Payer: BC Managed Care – PPO | Attending: Physician Assistant | Admitting: Physician Assistant

## 2022-12-01 VITALS — BP 130/88 | HR 78 | Resp 17 | Ht 74.0 in | Wt 249.0 lb

## 2022-12-01 DIAGNOSIS — E559 Vitamin D deficiency, unspecified: Secondary | ICD-10-CM

## 2022-12-01 DIAGNOSIS — I5022 Chronic systolic (congestive) heart failure: Secondary | ICD-10-CM

## 2022-12-01 DIAGNOSIS — M25461 Effusion, right knee: Secondary | ICD-10-CM

## 2022-12-01 DIAGNOSIS — Z5181 Encounter for therapeutic drug level monitoring: Secondary | ICD-10-CM | POA: Diagnosis not present

## 2022-12-01 DIAGNOSIS — R5383 Other fatigue: Secondary | ICD-10-CM | POA: Diagnosis not present

## 2022-12-01 DIAGNOSIS — E291 Testicular hypofunction: Secondary | ICD-10-CM

## 2022-12-01 DIAGNOSIS — B353 Tinea pedis: Secondary | ICD-10-CM

## 2022-12-01 DIAGNOSIS — M0600 Rheumatoid arthritis without rheumatoid factor, unspecified site: Secondary | ICD-10-CM

## 2022-12-01 DIAGNOSIS — M25532 Pain in left wrist: Secondary | ICD-10-CM

## 2022-12-01 DIAGNOSIS — I420 Dilated cardiomyopathy: Secondary | ICD-10-CM

## 2022-12-01 DIAGNOSIS — M1A09X0 Idiopathic chronic gout, multiple sites, without tophus (tophi): Secondary | ICD-10-CM | POA: Diagnosis not present

## 2022-12-01 DIAGNOSIS — I10 Essential (primary) hypertension: Secondary | ICD-10-CM

## 2022-12-01 DIAGNOSIS — Z86711 Personal history of pulmonary embolism: Secondary | ICD-10-CM

## 2022-12-01 DIAGNOSIS — M25562 Pain in left knee: Secondary | ICD-10-CM

## 2022-12-01 DIAGNOSIS — F321 Major depressive disorder, single episode, moderate: Secondary | ICD-10-CM

## 2022-12-01 DIAGNOSIS — M25571 Pain in right ankle and joints of right foot: Secondary | ICD-10-CM

## 2022-12-01 DIAGNOSIS — M25572 Pain in left ankle and joints of left foot: Secondary | ICD-10-CM

## 2022-12-01 DIAGNOSIS — R768 Other specified abnormal immunological findings in serum: Secondary | ICD-10-CM

## 2022-12-01 DIAGNOSIS — Z79899 Other long term (current) drug therapy: Secondary | ICD-10-CM | POA: Diagnosis not present

## 2022-12-01 DIAGNOSIS — Z8639 Personal history of other endocrine, nutritional and metabolic disease: Secondary | ICD-10-CM

## 2022-12-01 DIAGNOSIS — M79671 Pain in right foot: Secondary | ICD-10-CM

## 2022-12-01 DIAGNOSIS — M7662 Achilles tendinitis, left leg: Secondary | ICD-10-CM

## 2022-12-01 DIAGNOSIS — M25522 Pain in left elbow: Secondary | ICD-10-CM

## 2022-12-01 DIAGNOSIS — G8929 Other chronic pain: Secondary | ICD-10-CM

## 2022-12-01 DIAGNOSIS — M79672 Pain in left foot: Secondary | ICD-10-CM

## 2022-12-01 DIAGNOSIS — M25531 Pain in right wrist: Secondary | ICD-10-CM

## 2022-12-02 LAB — CBC WITH DIFFERENTIAL/PLATELET
Absolute Monocytes: 679 {cells}/uL (ref 200–950)
Basophils Absolute: 61 {cells}/uL (ref 0–200)
Basophils Relative: 0.7 %
Eosinophils Absolute: 44 {cells}/uL (ref 15–500)
Eosinophils Relative: 0.5 %
HCT: 61.4 % — ABNORMAL HIGH (ref 38.5–50.0)
Hemoglobin: 20.8 g/dL — ABNORMAL HIGH (ref 13.2–17.1)
Lymphs Abs: 3698 {cells}/uL (ref 850–3900)
MCH: 33.3 pg — ABNORMAL HIGH (ref 27.0–33.0)
MCHC: 33.9 g/dL (ref 32.0–36.0)
MCV: 98.4 fL (ref 80.0–100.0)
MPV: 9.8 fL (ref 7.5–12.5)
Monocytes Relative: 7.8 %
Neutro Abs: 4220 {cells}/uL (ref 1500–7800)
Neutrophils Relative %: 48.5 %
Platelets: 267 10*3/uL (ref 140–400)
RBC: 6.24 10*6/uL — ABNORMAL HIGH (ref 4.20–5.80)
RDW: 12.3 % (ref 11.0–15.0)
Total Lymphocyte: 42.5 %
WBC: 8.7 10*3/uL (ref 3.8–10.8)

## 2022-12-02 LAB — COMPLETE METABOLIC PANEL WITH GFR
AG Ratio: 1.9 (calc) (ref 1.0–2.5)
ALT: 40 U/L (ref 9–46)
AST: 29 U/L (ref 10–40)
Albumin: 5.1 g/dL (ref 3.6–5.1)
Alkaline phosphatase (APISO): 46 U/L (ref 36–130)
BUN: 22 mg/dL (ref 7–25)
CO2: 31 mmol/L (ref 20–32)
Calcium: 10 mg/dL (ref 8.6–10.3)
Chloride: 94 mmol/L — ABNORMAL LOW (ref 98–110)
Creat: 1.16 mg/dL (ref 0.60–1.29)
Globulin: 2.7 g/dL (ref 1.9–3.7)
Glucose, Bld: 110 mg/dL — ABNORMAL HIGH (ref 65–99)
Potassium: 4.2 mmol/L (ref 3.5–5.3)
Sodium: 139 mmol/L (ref 135–146)
Total Bilirubin: 1.5 mg/dL — ABNORMAL HIGH (ref 0.2–1.2)
Total Protein: 7.8 g/dL (ref 6.1–8.1)
eGFR: 79 mL/min/{1.73_m2} (ref >=60)

## 2022-12-02 LAB — VITAMIN D 25 HYDROXY (VIT D DEFICIENCY, FRACTURES): Vit D, 25-Hydroxy: 27 ng/mL — ABNORMAL LOW (ref 30–100)

## 2022-12-02 LAB — TSH: TSH: 1.56 m[IU]/L (ref 0.40–4.50)

## 2022-12-02 NOTE — Progress Notes (Signed)
RBC count, hemoglobin, and hematocrit are significantly elevated--please notify the patient. Dr. Corliss Skains recommends repeating CBC With diff today.   CMP stable.  TSH WNL Vitamin D is low-recommend taking vitamin D 50,000 units once weekly x3 months. Recheck vitamin D in 3 months.

## 2022-12-05 ENCOUNTER — Other Ambulatory Visit: Payer: Self-pay | Admitting: *Deleted

## 2022-12-05 ENCOUNTER — Other Ambulatory Visit: Payer: Self-pay | Admitting: Pharmacist

## 2022-12-05 ENCOUNTER — Encounter: Payer: Self-pay | Admitting: Rheumatology

## 2022-12-05 DIAGNOSIS — Z79899 Other long term (current) drug therapy: Secondary | ICD-10-CM | POA: Insufficient documentation

## 2022-12-05 DIAGNOSIS — M1A09X0 Idiopathic chronic gout, multiple sites, without tophus (tophi): Secondary | ICD-10-CM

## 2022-12-05 DIAGNOSIS — M0600 Rheumatoid arthritis without rheumatoid factor, unspecified site: Secondary | ICD-10-CM

## 2022-12-05 DIAGNOSIS — R768 Other specified abnormal immunological findings in serum: Secondary | ICD-10-CM

## 2022-12-05 DIAGNOSIS — Z5181 Encounter for therapeutic drug level monitoring: Secondary | ICD-10-CM

## 2022-12-05 DIAGNOSIS — M2550 Pain in unspecified joint: Secondary | ICD-10-CM

## 2022-12-05 DIAGNOSIS — Z111 Encounter for screening for respiratory tuberculosis: Secondary | ICD-10-CM | POA: Insufficient documentation

## 2022-12-05 MED ORDER — VITAMIN D (ERGOCALCIFEROL) 1.25 MG (50000 UNIT) PO CAPS: 50000.0000 [IU] | ORAL_CAPSULE | ORAL | 0 refills | Status: DC

## 2022-12-05 NOTE — Progress Notes (Signed)
Therapy plan placed for Orencia IV (U0454) for [facility] to start benefits investigation.  He is switching from SQ Orencia due to recurrent post-injection fatigue  Diagnosis: RA  Provider: Dr. Pollyann Savoy  Dose: 1000 mg every 4 weeks (based on ABW > 100 kg)  Last Clinic Visit: 12/01/2022 Next Clinic Visit: 01/31/2023  Chesley Mires, PharmD, MPH, BCPS, CPP Clinical Pharmacist (Rheumatology and Pulmonology)

## 2022-12-06 ENCOUNTER — Telehealth: Payer: Self-pay

## 2022-12-06 NOTE — Telephone Encounter (Signed)
Auth Submission: APPROVED Site of care: Site of care: [facility] Payer: [employer] Medication & CPT/J Code(s) submitted: Orencia (Abatacept) [id number] Route of submission (phone, fax, portal): fax Phone # Fax # Auth type: Buy/Bill PB Units/visits requested:1000mg , q4weeks.  Reference number: [id number] Approval from: 12/05/2022 to 12/04/2023

## 2022-12-09 NOTE — Progress Notes (Signed)
First Orencia infusion scheduled for 12/14/22

## 2022-12-13 ENCOUNTER — Ambulatory Visit: Payer: BC Managed Care – PPO

## 2022-12-13 ENCOUNTER — Telehealth: Payer: Self-pay | Admitting: Physician Assistant

## 2022-12-13 NOTE — Telephone Encounter (Signed)
Pt is requesting a callback to r/s his nurse visit due to him having to cancel. Please advise

## 2022-12-13 NOTE — Telephone Encounter (Signed)
Rescheduled nurse visit for BP check to 12/20/22.

## 2022-12-14 ENCOUNTER — Other Ambulatory Visit: Payer: Self-pay | Admitting: Emergency Medicine

## 2022-12-14 ENCOUNTER — Encounter: Payer: BC Managed Care – PPO | Attending: Rheumatology | Admitting: Emergency Medicine

## 2022-12-14 VITALS — BP 147/92 | HR 74 | Temp 98.5°F | Resp 16

## 2022-12-14 DIAGNOSIS — M0600 Rheumatoid arthritis without rheumatoid factor, unspecified site: Secondary | ICD-10-CM

## 2022-12-14 DIAGNOSIS — Z79899 Other long term (current) drug therapy: Secondary | ICD-10-CM

## 2022-12-14 MED ORDER — SODIUM CHLORIDE 0.9 % IV SOLN
1000.0000 mg | Freq: Once | INTRAVENOUS | Status: AC
  Administered 2022-12-14: 1000 mg via INTRAVENOUS
  Filled 2022-12-14: qty 40

## 2022-12-14 MED ORDER — DIPHENHYDRAMINE HCL 25 MG PO CAPS
25.0000 mg | ORAL_CAPSULE | Freq: Once | ORAL | Status: AC
  Administered 2022-12-14: 25 mg via ORAL
  Filled 2022-12-14: qty 1

## 2022-12-14 MED ORDER — ACETAMINOPHEN 325 MG PO TABS
650.0000 mg | ORAL_TABLET | Freq: Once | ORAL | Status: AC
  Administered 2022-12-14: 650 mg via ORAL
  Filled 2022-12-14: qty 2

## 2022-12-14 NOTE — Progress Notes (Signed)
Diagnosis: Seronegative rheumatoid arthritis   Provider:  Pollyann Savoy MD  Procedure: IV Infusion  IV Type: Peripheral, IV Location: L Antecubital  Orencia (Abatacept), Dose: 1000 mg  Infusion Start Time: 0929  Infusion Stop Time: 1004  Post Infusion IV Care: Observation period completed and Peripheral IV Discontinued  Discharge: Condition: Good, Destination: Home . AVS Provided  Performed by:  Arrie Senate, RN

## 2022-12-14 NOTE — Addendum Note (Signed)
Addended by: Murrell Redden on: 12/14/2022 09:24 AM   Modules accepted: Orders

## 2022-12-15 ENCOUNTER — Telehealth: Payer: Self-pay | Admitting: *Deleted

## 2022-12-15 DIAGNOSIS — D582 Other hemoglobinopathies: Secondary | ICD-10-CM

## 2022-12-15 LAB — CBC WITH DIFFERENTIAL/PLATELET
Absolute Lymphocytes: 2390 {cells}/uL (ref 850–3900)
Absolute Monocytes: 460 {cells}/uL (ref 200–950)
Basophils Absolute: 53 {cells}/uL (ref 0–200)
Basophils Relative: 0.9 %
Eosinophils Absolute: 242 {cells}/uL (ref 15–500)
Eosinophils Relative: 4.1 %
HCT: 59.6 % — ABNORMAL HIGH (ref 38.5–50.0)
Hemoglobin: 20.2 g/dL — ABNORMAL HIGH (ref 13.2–17.1)
MCH: 33.3 pg — ABNORMAL HIGH (ref 27.0–33.0)
MCHC: 33.9 g/dL (ref 32.0–36.0)
MCV: 98.2 fL (ref 80.0–100.0)
MPV: 10.1 fL (ref 7.5–12.5)
Monocytes Relative: 7.8 %
Neutro Abs: 2755 {cells}/uL (ref 1500–7800)
Neutrophils Relative %: 46.7 %
Platelets: 272 10*3/uL (ref 140–400)
RBC: 6.07 10*6/uL — ABNORMAL HIGH (ref 4.20–5.80)
RDW: 12 % (ref 11.0–15.0)
Total Lymphocyte: 40.5 %
WBC: 5.9 10*3/uL (ref 3.8–10.8)

## 2022-12-15 LAB — COMPREHENSIVE METABOLIC PANEL
AG Ratio: 1.8 (calc) (ref 1.0–2.5)
ALT: 42 U/L (ref 9–46)
AST: 36 U/L (ref 10–40)
Albumin: 4.8 g/dL (ref 3.6–5.1)
Alkaline phosphatase (APISO): 50 U/L (ref 36–130)
BUN: 12 mg/dL (ref 7–25)
CO2: 29 mmol/L (ref 20–32)
Calcium: 10 mg/dL (ref 8.6–10.3)
Chloride: 97 mmol/L — ABNORMAL LOW (ref 98–110)
Creat: 0.96 mg/dL (ref 0.60–1.29)
Globulin: 2.7 g/dL (ref 1.9–3.7)
Glucose, Bld: 116 mg/dL — ABNORMAL HIGH (ref 65–99)
Potassium: 4.7 mmol/L (ref 3.5–5.3)
Sodium: 139 mmol/L (ref 135–146)
Total Bilirubin: 1.2 mg/dL (ref 0.2–1.2)
Total Protein: 7.5 g/dL (ref 6.1–8.1)

## 2022-12-15 LAB — TEST AUTHORIZATION

## 2022-12-15 LAB — IRON, TOTAL/TOTAL IRON BINDING CAP
%SAT: 45 % (ref 20–48)
Iron: 156 ug/dL (ref 50–180)
TIBC: 348 ug/dL (ref 250–425)

## 2022-12-15 NOTE — Telephone Encounter (Signed)
-----   Message from [facility] sent at 12/15/2022  7:29 AM EDT ----- Hemoglobin remains elevated.  Please add iron panel.  CMP is normal.  Please place an urgent referral to hematology for evaluation.

## 2022-12-15 NOTE — Progress Notes (Signed)
Hemoglobin remains elevated.  Please add iron panel.  CMP is normal.  Please place an urgent referral to hematology for evaluation.

## 2022-12-15 NOTE — Progress Notes (Signed)
I called the patient to discuss the hematology referral placed today.  He states he received the call to schedule hematology visit but was unable to answer the call at the time.  He plans on calling back tomorrow to schedule an appointment with hematology for further evaluation.  Patient states that he had his first IV Orencia infusion yesterday on 11/24/2022.  He has noticed some increased fatigue today but otherwise had no side effects or complications after the infusion.  Patient was prescribed vitamin D 50,000 units once weekly for 12 weeks.  Patient states that he took the prescription for vitamin D 50,000 units once daily for the past 7 days.  His last dose of vitamin D was on 11/23/2022.  Discussed the risk for vitamin D toxicity.  Recommended having his vitamin D checked tomorrow.  He is unable to have vitamin D checked tomorrow due to complex with work.  A future order for vitamin D will be placed today so he can go to a [facility] location hopefully over the weekend.  Patient states that he would like his FMLA paperwork revised since he has switched to IV Orencia infusions and will require frequent office visits with [place] and hematologist.  He states that for the office visits he is requesting a 4-hour window to cover him with work.  He states that with frequent flares and frequent visits he would like to have up to 3-4 occurrences lasting 3 to 4 days at a time per month to protect his job.  Okay to revise FMLA paperwork.

## 2022-12-17 LAB — QUANTIFERON-TB GOLD PLUS
Mitogen-NIL: >10 [IU]/mL
NIL: 0.29 [IU]/mL
QuantiFERON-TB Gold Plus: NEGATIVE
TB1-NIL: 0.16 [IU]/mL
TB2-NIL: <0 [IU]/mL

## 2022-12-19 ENCOUNTER — Encounter: Payer: Self-pay | Admitting: Rheumatology

## 2022-12-20 ENCOUNTER — Telehealth: Payer: Self-pay | Admitting: Nurse Practitioner

## 2022-12-20 ENCOUNTER — Ambulatory Visit: Payer: BC Managed Care – PPO | Attending: Family Medicine

## 2022-12-20 NOTE — Telephone Encounter (Signed)
Rescheduled appointment per provider PAL. Patient is aware of the changes made to his upcoming appointments.

## 2022-12-20 NOTE — Telephone Encounter (Signed)
Patient contacted the office and states he was calling us back. Advised the patient this was the most recent message I saw and advised him Sue Lush states she will keep an eye out for it and complete it as soon as she can. Patient verbalized understanding.

## 2022-12-21 ENCOUNTER — Other Ambulatory Visit: Payer: BC Managed Care – PPO

## 2022-12-21 ENCOUNTER — Inpatient Hospital Stay: Payer: BC Managed Care – PPO

## 2022-12-21 ENCOUNTER — Inpatient Hospital Stay: Payer: BC Managed Care – PPO | Attending: Nurse Practitioner | Admitting: Nurse Practitioner

## 2022-12-21 ENCOUNTER — Encounter: Payer: BC Managed Care – PPO | Admitting: Oncology

## 2022-12-21 ENCOUNTER — Encounter: Payer: Self-pay | Admitting: Nurse Practitioner

## 2022-12-21 VITALS — BP 137/92 | HR 105 | Temp 98.1°F | Resp 18 | Ht 74.0 in | Wt 242.0 lb

## 2022-12-21 DIAGNOSIS — Z86711 Personal history of pulmonary embolism: Secondary | ICD-10-CM

## 2022-12-21 DIAGNOSIS — D751 Secondary polycythemia: Secondary | ICD-10-CM | POA: Diagnosis not present

## 2022-12-21 DIAGNOSIS — I429 Cardiomyopathy, unspecified: Secondary | ICD-10-CM

## 2022-12-21 DIAGNOSIS — I10 Essential (primary) hypertension: Secondary | ICD-10-CM | POA: Diagnosis not present

## 2022-12-21 DIAGNOSIS — Z86718 Personal history of other venous thrombosis and embolism: Secondary | ICD-10-CM | POA: Diagnosis not present

## 2022-12-21 DIAGNOSIS — I509 Heart failure, unspecified: Secondary | ICD-10-CM | POA: Diagnosis not present

## 2022-12-21 LAB — CBC WITH DIFFERENTIAL (CANCER CENTER ONLY)
Abs Immature Granulocytes: 0.01 10*3/uL (ref 0.00–0.07)
Basophils Absolute: 0.1 10*3/uL (ref 0.0–0.1)
Basophils Relative: 1 %
Eosinophils Absolute: 0.1 10*3/uL (ref 0.0–0.5)
Eosinophils Relative: 2 %
HCT: 56.2 % — ABNORMAL HIGH (ref 39.0–52.0)
Hemoglobin: 20.5 g/dL — ABNORMAL HIGH (ref 13.0–17.0)
Immature Granulocytes: 0 %
Lymphocytes Relative: 32 %
Lymphs Abs: 2.7 10*3/uL (ref 0.7–4.0)
MCH: 33.7 pg (ref 26.0–34.0)
MCHC: 36.5 g/dL — ABNORMAL HIGH (ref 30.0–36.0)
MCV: 92.3 fL (ref 80.0–100.0)
Monocytes Absolute: 0.7 10*3/uL (ref 0.1–1.0)
Monocytes Relative: 8 %
Neutro Abs: 4.9 10*3/uL (ref 1.7–7.7)
Neutrophils Relative %: 57 %
Platelet Count: 258 10*3/uL (ref 150–400)
RBC: 6.09 MIL/uL — ABNORMAL HIGH (ref 4.22–5.81)
RDW: 12.1 % (ref 11.5–15.5)
WBC Count: 8.5 10*3/uL (ref 4.0–10.5)
nRBC: 0 % (ref 0.0–0.2)

## 2022-12-21 LAB — FERRITIN: Ferritin: 721 ng/mL — ABNORMAL HIGH (ref 24–336)

## 2022-12-21 NOTE — Progress Notes (Cosign Needed)
[facility]   [phone] [phone]) [phone]   Clinic New consult Note   Patient Care Team: Tommie Sams, DO as PCP - General (Family Medicine) Pollyann Savoy, MD as Consulting Physician (Rheumatology) Date of Service: 12/21/2022  CHIEF COMPLAINTS/PURPOSE OF CONSULTATION:  Elevated hemoglobin, referred by Dr. Corliss Skains  HISTORY OF PRESENTING ILLNESS:  Ryan Gibson 47 y.o. male with PMH including HTN, CHF, dilated cardiomyopathy, RA, anxiety, gout, acute cholecystitis and subsequent PE 02/16/2020 with nonocclusive thrombus of left common femoral vein, and hypogonadism since at least 2017, on testosterone inj (q2 weeks x1 year) is here because of elevated hemoglobin/hematocrit.  Has been on Orencia for RA per rheumatology since 08/2022. He experienced severe fatigue with injection, changed to intravenous infusion and tolerates better. She has 20-30 lbs unintentional weight loss in the past months which he attributes to low appetite coinciding with starting Orencia. He was found to have abnormal CBC on Rheum follow-up visit from 12/01/2022, hemoglobin 20.8, HCT 61.4.  Repeat labs 12/14/2022 showed hemoglobin 20.2, HCT 59.6 normal serum iron/TIBC/%sat.   Socially, he is married with 4 children.  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  Independent with ADLs and drives.  Previously drank alcohol but not recently, denies tobacco/vaping or other drug use.  Smokes a cigar occasionally.  Not up-to-date on cancer screenings.  Denies family history of elevated H/H, blood disorder, or cancer.  He presents by himself, he is chronically fatigued.  On Orencia injection he would sleep 12-13 hours after work, this is improved some with changing to the IV infusion.  He has 20 pounds weight loss since 04/2022 with loss of appetite.  Denies abdominal or other pain, change in bowel habits, bleeding, recent infection, skin itching/burning or redness, or any other specific  complaints.  MEDICAL HISTORY:  Past Medical History:  Diagnosis Date   Asthma    AS A CHILD ONLY   DVT (deep venous thrombosis) (HCC)    Foot pain, right    painful x 1 month and swelling   Idiopathic gout     SURGICAL HISTORY: Past Surgical History:  Procedure Laterality Date   HERNIA REPAIR  07/13/2011   [facility]   INGUINAL HERNIA REPAIR  07/13/2011   Procedure: HERNIA REPAIR INGUINAL ADULT;  Surgeon: Wilmon Arms. Corliss Skains, MD;  Location: [facility];  Service: General;  Laterality: Right;    SOCIAL HISTORY: Social History   Socioeconomic History   Marital status: Married    Spouse name: Not on file   Number of children: 4   Years of education: Not on file   Highest education level: Not on file  Occupational History   Not on file  Tobacco Use   Smoking status: Never    Passive exposure: Never   Smokeless tobacco: Current    Types: Chew  Vaping Use   Vaping status: Never Used  Substance and Sexual Activity   Alcohol use: Yes    Comment: Social   Drug use: No   Sexual activity: Yes    Birth control/protection: None    Comment: Married  Other Topics Concern   Not on file  Social History Narrative   Married for 13 years.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   Social Determinants of Health   Financial Resource Strain: Patient Declined (09/09/2022)   Overall Financial Resource Strain (CARDIA)    Difficulty of Paying Living Expenses: Patient declined  Food Insecurity: No Food Insecurity (09/10/2022)   Hunger Vital Sign  Worried About Programme researcher, broadcasting/film/video in the Last Year: Never true    Ran Out of Food in the Last Year: Never true  Transportation Needs: No Transportation Needs (09/10/2022)   PRAPARE - Administrator, Civil Service (Medical): No    Lack of Transportation (Non-Medical): No  Physical Activity: Insufficiently Active (09/09/2022)   Exercise Vital Sign    Days of Exercise per Week: 1 day    Minutes of Exercise per  Session: 30 min  Stress: Stress Concern Present (09/09/2022)   [employer] of Occupational Health - Occupational Stress Questionnaire    Feeling of Stress : To some extent  Social Connections: Socially Integrated (09/09/2022)   Social Connection and Isolation Panel [NHANES]    Frequency of Communication with Friends and Family: More than three times a week    Frequency of Social Gatherings with Friends and Family: Once a week    Attends Religious Services: More than 4 times per year    Active Member of [employer] or Organizations: Yes    Attends Engineer, structural: More than 4 times per year    Marital Status: Married  Catering manager Violence: Not At Risk (09/10/2022)   Humiliation, Afraid, Rape, and Kick questionnaire    Fear of Current or Ex-Partner: No    Emotionally Abused: No    Physically Abused: No    Sexually Abused: No    FAMILY HISTORY: Family History  Problem Relation Age of Onset   Healthy Mother    Heart disease Father     ALLERGIES:  is allergic to vancomycin.  MEDICATIONS:  Current Outpatient Medications  Medication Sig Dispense Refill   Abatacept (ORENCIA IV) Inject 1,000 mg into the vein every 28 (twenty-eight) days.     colchicine 0.6 MG tablet Take 1 tablet (0.6 mg total) by mouth daily. 90 tablet 0   DULoxetine (CYMBALTA) 60 MG capsule Take 1 capsule (60 mg total) by mouth daily. 90 capsule 3   ELIQUIS 5 MG TABS tablet Take 1 tablet (5 mg total) by mouth 2 (two) times daily. 180 tablet 3   empagliflozin (JARDIANCE) 10 MG TABS tablet Take 1 tablet (10 mg total) by mouth daily before breakfast. 90 tablet 3   Febuxostat 80 MG TABS TAKE 1 TABLET BY MOUTH EVERY DAY 90 tablet 0   furosemide (LASIX) 40 MG tablet Take 1 tablet (40 mg total) by mouth daily as needed. 90 tablet 1   metoprolol succinate (TOPROL-XL) 100 MG 24 hr tablet Take 1.5 tablets (150 mg total) by mouth daily. Take with or immediately following a meal. 135 tablet 3    sacubitril-valsartan (ENTRESTO) 49-51 MG Take 1 tablet by mouth 2 (two) times daily. 180 tablet 3   spironolactone (ALDACTONE) 25 MG tablet Take 1 tablet (25 mg total) by mouth daily. 90 tablet 3   Testosterone Enanthate 200 MG/ML SOLN Inject 200 mg as directed once a week. 5 mL 1   valsartan (DIOVAN) 160 MG tablet Take 1 tablet (160 mg total) by mouth daily. 90 tablet 3   Vitamin D, Ergocalciferol, (DRISDOL) 1.25 MG (50000 UNIT) CAPS capsule Take 1 capsule (50,000 Units total) by mouth every 7 (seven) days. 12 capsule 0   predniSONE (DELTASONE) 5 MG tablet Take 4 tablets by mouth daily x 1 week, 3 tablets daily x 1 week, 2 tablets daily x 1 week, 1 tablet daily x 1 week. (Patient not taking: Reported on 12/01/2022) 70 tablet 0   No current facility-administered medications  for this visit.    REVIEW OF SYSTEMS:   Constitutional: Denies fevers, chills or abnormal night sweats (+) fatigue (+) weight loss Eyes: Denies blurriness of vision, double vision or watery eyes Ears, nose, mouth, throat, and face: Denies mucositis or sore throat Respiratory: Denies cough, dyspnea or wheezes (+) h/o PE Cardiovascular: Denies palpitation, chest discomfort or lower extremity swelling (+) HTN (+) CHF (+) cardiomyopathy  Gastrointestinal:  Denies nausea, heartburn or change in bowel habits Skin: Denies abnormal skin rashes Lymphatics: Denies new lymphadenopathy or easy bruising Neurological:Denies numbness, tingling or new weaknesses Behavioral/Psych: Mood is stable, no new changes  MSK: (+) RA (+) gout All other systems were reviewed with the patient and are negative.  PHYSICAL EXAMINATION: ECOG PERFORMANCE STATUS: 1 - Symptomatic but completely ambulatory  Vitals:   12/21/22 1218  BP: (!) 137/92  Pulse: (!) 105  Resp: 18  Temp: 98.1 F (36.7 C)  SpO2: 96%   Filed Weights   12/21/22 1218  Weight: 242 lb (109.8 kg)    GENERAL:alert, no distress and comfortable SKIN: no erythema, rashes or  significant lesions EYES:  sclera clear NECK: without mass LYMPH:  no palpable cervical or supraclavicular lymphadenopathy  LUNGS: clear with normal breathing effort HEART: regular rate & rhythm, no lower extremity edema ABDOMEN:abdomen soft, non-tender and normal bowel sounds Musculoskeletal:no cyanosis of digits and no clubbing  PSYCH: alert & oriented x 3 with fluent speech NEURO: no focal motor/sensory deficits  LABORATORY DATA:  I have reviewed the data as listed    Latest Ref Rng & Units 12/21/2022    1:39 PM 12/14/2022    8:48 AM 12/01/2022    9:05 AM  CBC  WBC 4.0 - 10.5 K/uL 8.5  5.9  8.7   Hemoglobin 13.0 - 17.0 g/dL 16.1  09.6  04.5   Hematocrit 39.0 - 52.0 % 56.2  59.6  61.4   Platelets 150 - 400 K/uL 258  272  267        Latest Ref Rng & Units 12/14/2022    8:48 AM 12/01/2022    9:05 AM 09/10/2022    3:59 AM  CMP  Glucose 65 - 99 mg/dL 409  811  914   BUN 7 - 25 mg/dL 12  22  21    Creatinine 0.60 - 1.29 mg/dL 7.82  9.56  2.13   Sodium 135 - 146 mmol/L 139  139  130   Potassium 3.5 - 5.3 mmol/L 4.7  4.2  3.7   Chloride 98 - 110 mmol/L 97  94  93   CO2 20 - 32 mmol/L 29  31  27    Calcium 8.6 - 10.3 mg/dL 08.6  57.8  9.1   Total Protein 6.1 - 8.1 g/dL 7.5  7.8  7.8   Total Bilirubin 0.2 - 1.2 mg/dL 1.2  1.5  1.7   Alkaline Phos 38 - 126 U/L   39   AST 10 - 40 U/L 36  29  24   ALT 9 - 46 U/L 42  40  36      RADIOGRAPHIC STUDIES: I have personally reviewed the radiological images as listed and agreed with the findings in the report. No results found.  ASSESSMENT & PLAN: 47 yo male  Polycythemia -We reviewed his medical record in detail with the patient. He had normal CBC until he developed significant erythrocytosis in 11/2022.  -Only change in his health care was starting Orencia for RA in 08/2022. I reviewed the potential SE  profile for this drug and elevated H/H is not listed. However he has some weight loss and reported dehydration so this could be a  secondary confounding factor -He has been on testosterone for 1 year which is a known cause of secondary polycythemia -Will check epo level and rule out primary bone marrow condition/MPN such as PV, as primary vs secondary polycythemia will determine treatment.  -If he is found to have PV, we briefly discussed phlebotomy program. He is already on anticoagulation from h/o PE  -Will call with results and management recommendations  H/o DVT/PE -01/2020 -Found after acute cholecystitis, work up showed nonocclusive thrombus of left common femoral vein -On Eliquis, we discussed this reduces risk of thrombosis from polycythemia  HTN, CHF, cardiomyopathy  -We discussed hypoxia can cause secondary polycythemia -no hypoxia on 6 minute walk test. He does not snore often so likely not OSA -Only smokes cigar periodically    PLAN: -medical record reviewed -Lab today (CBC, ferritin, epo, MPN panel)  -Phone f/up in 2 weeks -Pt seen with Dr. Mosetta Putt   Orders Placed This Encounter  Procedures   CBC with Differential (Cancer Center Only)    Standing Status:   Future    Number of Occurrences:   1    Standing Expiration Date:   12/21/2023   Ferritin    Standing Status:   Future    Number of Occurrences:   1    Standing Expiration Date:   12/21/2023   JAK2 (INCLUDING V617F AND EXON 12), MPL,& CALR W/RFL MPN PANEL (NGS)    Standing Status:   Future    Number of Occurrences:   1    Standing Expiration Date:   12/21/2023   Erythropoietin    Standing Status:   Future    Number of Occurrences:   1    Standing Expiration Date:   12/21/2023      All questions were answered. The patient knows to call the clinic with any problems, questions or concerns.      Pollyann Samples, NP 12/23/22   ADDENDUM I have seen the patient, examined him. I agree with the assessment and and plan and have edited the notes.   47 yo male with PMH of HTN, CHF, dilated cardiomyopathy, RA, anxiety, gout, acute  cholecystitis and subsequent PE 02/16/2020 with nonocclusive thrombus of left common femoral vein, and hypogonadism since at least 2017, on testosterone inj (q2 weeks x1 year) who was referred for significant erythrocytosis.  We discussed potential etiology of polycythemia vera and secondary polycythemia from testosterone, hypoxia, smoking, sleep apnea etc.  His polycythemia started about 2 3 months ago, after he started Orencia for RA, which could be partially related decreased oral intake and dehydration. Will check MPN panel and epo level, do 6 min walking to see if he has hypoxia. Will call him with results. He is on eliquis which will reduce his risk of thrombosis from polycythemia. All questions were answered.   Malachy Mood MD 12/21/2022

## 2022-12-23 ENCOUNTER — Encounter: Payer: Self-pay | Admitting: Nurse Practitioner

## 2022-12-23 LAB — ERYTHROPOIETIN: Erythropoietin: 3.2 m[IU]/mL (ref 2.6–18.5)

## 2022-12-26 ENCOUNTER — Encounter: Payer: Self-pay | Admitting: Rheumatology

## 2022-12-27 LAB — JAK2 (INCLUDING V617F AND EXON 12), MPL,& CALR W/RFL MPN PANEL (NGS)

## 2023-01-11 ENCOUNTER — Encounter: Payer: BC Managed Care – PPO | Attending: Rheumatology | Admitting: Internal Medicine

## 2023-01-11 VITALS — BP 151/101 | HR 73 | Temp 98.0°F | Resp 16

## 2023-01-11 DIAGNOSIS — M0600 Rheumatoid arthritis without rheumatoid factor, unspecified site: Secondary | ICD-10-CM | POA: Diagnosis not present

## 2023-01-11 DIAGNOSIS — Z79899 Other long term (current) drug therapy: Secondary | ICD-10-CM | POA: Insufficient documentation

## 2023-01-11 MED ORDER — SODIUM CHLORIDE 0.9 % IV SOLN
1000.0000 mg | Freq: Once | INTRAVENOUS | Status: AC
  Administered 2023-01-11: 1000 mg via INTRAVENOUS
  Filled 2023-01-11: qty 40

## 2023-01-11 MED ORDER — DIPHENHYDRAMINE HCL 25 MG PO CAPS
25.0000 mg | ORAL_CAPSULE | Freq: Once | ORAL | Status: AC
  Administered 2023-01-11: 25 mg via ORAL

## 2023-01-11 MED ORDER — ACETAMINOPHEN 325 MG PO TABS
650.0000 mg | ORAL_TABLET | Freq: Once | ORAL | Status: AC
  Administered 2023-01-11: 650 mg via ORAL

## 2023-01-11 NOTE — Progress Notes (Signed)
Diagnosis: Rheumatoid Arthritis  Provider:  Pollyann Savoy MD  Procedure: IV Infusion  IV Type: Peripheral, IV Location: L Antecubital  Orencia (Abatacept), Dose: 1000 mg  Infusion Start Time: 0932  Infusion Stop Time: 1005  Post Infusion IV Care: Observation period completed  Discharge: Condition: Good, Destination: Home . AVS Provided  Performed by:  Cleotilde Neer, LPN

## 2023-01-12 ENCOUNTER — Encounter: Payer: Self-pay | Admitting: Nurse Practitioner

## 2023-01-12 ENCOUNTER — Inpatient Hospital Stay: Payer: BC Managed Care – PPO | Attending: Nurse Practitioner | Admitting: Nurse Practitioner

## 2023-01-12 DIAGNOSIS — Z7901 Long term (current) use of anticoagulants: Secondary | ICD-10-CM | POA: Diagnosis not present

## 2023-01-12 DIAGNOSIS — D751 Secondary polycythemia: Secondary | ICD-10-CM

## 2023-01-12 DIAGNOSIS — I10 Essential (primary) hypertension: Secondary | ICD-10-CM

## 2023-01-12 DIAGNOSIS — Z86718 Personal history of other venous thrombosis and embolism: Secondary | ICD-10-CM | POA: Diagnosis not present

## 2023-01-12 DIAGNOSIS — I420 Dilated cardiomyopathy: Secondary | ICD-10-CM

## 2023-01-12 DIAGNOSIS — I5022 Chronic systolic (congestive) heart failure: Secondary | ICD-10-CM

## 2023-01-12 NOTE — Progress Notes (Signed)
Patient Care Team: Tommie Sams, DO as PCP - General (Family Medicine) Ryan Savoy, MD as Consulting Physician (Rheumatology)   I connected with Ryan Gibson on 01/12/23 at 11:00 AM EST by telephone visit and verified that I am speaking with the correct person using two identifiers.   I discussed the limitations, risks, security and privacy concerns of performing an evaluation and management service by telemedicine and the availability of in-person appointments. I also discussed with the patient that there may be a patient responsible charge related to this service. The patient expressed understanding and agreed to proceed.   Other persons participating in the visit and their role in the encounter: None   Patient's location: Work  Provider's location: [employer] office   CHIEF COMPLAINT: Polycythemia, review work up   CURRENT THERAPY: None  INTERVAL HISTORY Ryan Gibson presents by phone to review polycythemia work up. He recalls taking high dose oral iron once daily for 1 week rather than once a week as intended. Stopped this about 3-4 weeks ago. He has done research on his condition and questions if he has Still's disease.   ROS  All other systems reviewed and negative  Past Medical History:  Diagnosis Date   Asthma    AS A CHILD ONLY   DVT (deep venous thrombosis) (HCC)    Foot pain, right    painful x 1 month and swelling   Idiopathic gout      Past Surgical History:  Procedure Laterality Date   HERNIA REPAIR  07/13/2011   [facility]   INGUINAL HERNIA REPAIR  07/13/2011   Procedure: HERNIA REPAIR INGUINAL ADULT;  Surgeon: Wilmon Arms. Corliss Skains, MD;  Location: [facility];  Service: General;  Laterality: Right;     Outpatient Encounter Medications as of 01/12/2023  Medication Sig   Abatacept (ORENCIA IV) Inject 1,000 mg into the vein every 28 (twenty-eight) days.   colchicine 0.6 MG tablet Take 1 tablet (0.6 mg total) by mouth daily.   DULoxetine (CYMBALTA) 60 MG capsule  Take 1 capsule (60 mg total) by mouth daily.   ELIQUIS 5 MG TABS tablet Take 1 tablet (5 mg total) by mouth 2 (two) times daily.   empagliflozin (JARDIANCE) 10 MG TABS tablet Take 1 tablet (10 mg total) by mouth daily before breakfast.   Febuxostat 80 MG TABS TAKE 1 TABLET BY MOUTH EVERY DAY   furosemide (LASIX) 40 MG tablet Take 1 tablet (40 mg total) by mouth daily as needed.   metoprolol succinate (TOPROL-XL) 100 MG 24 hr tablet Take 1.5 tablets (150 mg total) by mouth daily. Take with or immediately following a meal.   predniSONE (DELTASONE) 5 MG tablet Take 4 tablets by mouth daily x 1 week, 3 tablets daily x 1 week, 2 tablets daily x 1 week, 1 tablet daily x 1 week. (Patient not taking: Reported on 12/01/2022)   sacubitril-valsartan (ENTRESTO) 49-51 MG Take 1 tablet by mouth 2 (two) times daily.   spironolactone (ALDACTONE) 25 MG tablet Take 1 tablet (25 mg total) by mouth daily.   Testosterone Enanthate 200 MG/ML SOLN Inject 200 mg as directed once a week.   valsartan (DIOVAN) 160 MG tablet Take 1 tablet (160 mg total) by mouth daily.   Vitamin D, Ergocalciferol, (DRISDOL) 1.25 MG (50000 UNIT) CAPS capsule Take 1 capsule (50,000 Units total) by mouth every 7 (seven) days.   No facility-administered encounter medications on file as of 01/12/2023.     There were no vitals filed for  this visit. There is no height or weight on file to calculate BMI.   PHYSICAL EXAM Patient appears well by phone.  Voice is strong, speech is clear.  Mood/affect appear normal for situation.  No cough or conversational dyspnea  CBC    Component Value Date/Time   WBC 8.5 12/21/2022 1339   WBC 5.9 12/14/2022 0848   RBC 6.09 (H) 12/21/2022 1339   HGB 20.5 (H) 12/21/2022 1339   HGB 15.5 04/28/2022 1640   HCT 56.2 (H) 12/21/2022 1339   HCT 43.6 04/28/2022 1640   PLT 258 12/21/2022 1339   PLT 221 04/28/2022 1640   MCV 92.3 12/21/2022 1339   MCV 95 04/28/2022 1640   MCH 33.7 12/21/2022 1339   MCHC 36.5  (H) 12/21/2022 1339   RDW 12.1 12/21/2022 1339   RDW 13.5 04/28/2022 1640   LYMPHSABS 2.7 12/21/2022 1339   LYMPHSABS 3.2 (H) 04/28/2022 1640   MONOABS 0.7 12/21/2022 1339   EOSABS 0.1 12/21/2022 1339   EOSABS 0.2 04/28/2022 1640   BASOSABS 0.1 12/21/2022 1339   BASOSABS 0.1 04/28/2022 1640     CMP     Component Value Date/Time   NA 139 12/14/2022 0848   NA 137 04/28/2022 1640   K 4.7 12/14/2022 0848   CL 97 (L) 12/14/2022 0848   CO2 29 12/14/2022 0848   GLUCOSE 116 (H) 12/14/2022 0848   BUN 12 12/14/2022 0848   BUN 11 04/28/2022 1640   CREATININE 0.96 12/14/2022 0848   CALCIUM 10.0 12/14/2022 0848   PROT 7.5 12/14/2022 0848   PROT 7.4 04/28/2022 1640   ALBUMIN 3.8 09/10/2022 0359   ALBUMIN 4.8 04/28/2022 1640   AST 36 12/14/2022 0848   ALT 42 12/14/2022 0848   ALKPHOS 39 09/10/2022 0359   BILITOT 1.2 12/14/2022 0848   BILITOT 0.6 04/28/2022 1640   GFRNONAA >60 09/10/2022 0359   GFRNONAA 83 02/10/2020 1525   GFRAA 96 02/10/2020 1525     ASSESSMENT & PLAN: 47 yo male   Polycythemia -We reviewed his medical record in detail with the patient. He had normal CBC until he developed significant erythrocytosis in 11/2022, RBC 6.09, hgb 20.4, Hgb 56.2%  -Only change in his health care was starting Orencia for RA in 08/2022. I reviewed the potential SE profile for this drug and elevated H/H is not listed. However he has some weight loss and reported dehydration so this could be a secondary confounding factor -He has been on testosterone for 1 year which is a known cause of secondary polycythemia -I reviewed his work up which shows normal epo and JAK2 panel, so MPN such as PV is essentially ruled out. He does not need phlebotomy program here -His polycythemia is likely secondary to testosterone use. The risk of thrombosis is lower in secondary polycythemia and he is already on anticoagulation for h/o thrombosis -We recommend to donate blood q6 months to regular H/H, and monitor CBC  q6 months   -His work up revealed elevated ferritin to 721 in the setting of taking high dose oral iron, which he has stopped, and also likely a component of chronic inflammation. We did not check complete iron panel at that time.  -Recommend to check iron/TIBC/ferritin in 3 months, if ferritin >350 and iron saturation >45%, will need to r/o hemochromatosis  -He follows with PCP and sees rheumatology q3 months, asks if those providers can draw these labs, will get confirmation from them -If he is followed by PCP or rheumatology, he does not  need to see [place] back   H/o DVT/PE -01/2020 -Found after acute cholecystitis, work up showed nonocclusive thrombus of left common femoral vein -On Eliquis, we discussed this reduces risk of thrombosis from polycythemia   HTN, CHF, cardiomyopathy  -We discussed hypoxia can cause secondary polycythemia -no hypoxia on 6 minute walk test. 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, monitor CBC every 6 months and donate blood twice a year -Follow-up elevated ferritin, repeat full iron panel in 3 months, if ferritin > 350 and iron saturation greater than 45% will need to rule out hemochromatosis -F/up as needed, as long as PCP and/or Rheumatology will follow labs   Orders Placed This Encounter  Procedures   CBC with Differential (Cancer Center Only)    Standing Status:   Future    Standing Expiration Date:   01/12/2024   Ferritin    Standing Status:   Future    Standing Expiration Date:   01/12/2024   Iron and Iron Binding Capacity ([facility] only)    Standing Status:   Future    Standing Expiration Date:   01/12/2024   Draw extra clot specimen    Standing Status:   Future    Standing Expiration Date:   01/12/2024      All questions were answered. The patient knows to call the clinic with any problems, questions or  concerns. No barriers to learning were detected. I spent 9 minutes counseling the patient face to face. The total time spent in the appointment was 15 minutes and more than 50% was on counseling, review of test results, and coordination of care.   Santiago Glad, NP-C 01/12/2023

## 2023-01-17 NOTE — Progress Notes (Unsigned)
Office Visit Note  Patient: Ryan Gibson             Date of Birth: [birth year]           MRN: [id number]             PCP: Tommie Sams, DO Referring: Tommie Sams, DO Visit Date: 01/31/2023 Occupation: @GUAROCC @  Subjective:  Flare involving multiple joints   History of Present Illness: Ryan Gibson is a 47 y.o. male with history of seronegative rheumatoid arthritis and gout.  Patient remains on Orencia IV infusions every 4 weeks. His most recent infusion was on 01/11/23 and he is scheduled for his next infusion on 02/08/2023.  He has not yet found IV Orencia infusions to be effective at managing his symptoms.  He continues to have recurrent severe flares involving multiple joints.  His symptoms have been debilitating to the point that he has difficulty working or performing ADLs.  He has had difficulty ambulating at times due to severity of pain and inflammation involving his left knee.  He has had to use a walker off and on for assistance.  He is not experiencing significant nocturnal pain.  He is currently having a flare involving his right elbow, right wrist, and the left knee joint.  A prednisone taper was sent to the pharmacy on 01/23/23.  Patient states that he had to double the dose of prednisone for relief so he ran out of the prescription for prednisone sooner than the taper was written for.  He continues to have persistent discomfort involving multiple joints.  Patient states that his symptoms typically improve with high-dose prednisone up to 60 mg daily.  Patient has requested a new prednisone taper to be sent to the pharmacy as well as a referral to pain management.  His pain levels have been inadequately controlled. Patient is open to discuss treatment options since he does not feel that Ryan Gibson is effective.  He discussed that he recently had a follow-up visit with cardiology and had an updated echocardiogram.  Patient states that his ejection fraction has improved so  he would like to discuss possibly initiating a TNF inhibitor if possible. Patient states that he has had to miss work due to the frequency of office visits, his infusions, and frequency of flares.  Patient states that he will require an updated FMLA form to reflect the frequency to have this flares.  Activities of Daily Living:  Patient reports morning stiffness for all day. Patient Reports nocturnal pain.  Difficulty dressing/grooming: Reports Difficulty climbing stairs: Reports Difficulty getting out of chair: Reports Difficulty using hands for taps, buttons, cutlery, and/or writing: Reports  Review of Systems  Constitutional:  Positive for fatigue.  HENT:  Negative for mouth sores and mouth dryness.   Eyes:  Positive for pain, visual disturbance and dryness.  Respiratory:  Negative for shortness of breath.   Cardiovascular:  Negative for chest pain and palpitations.  Gastrointestinal:  Negative for blood in stool, constipation and diarrhea.  Endocrine: Negative for increased urination.  Genitourinary:  Negative for involuntary urination.  Musculoskeletal:  Positive for joint pain, gait problem, joint pain, joint swelling, myalgias, muscle weakness, morning stiffness, muscle tenderness and myalgias.  Skin:  Negative for color change, rash, hair loss and sensitivity to sunlight.  Allergic/Immunologic: Negative for susceptible to infections.  Neurological:  Negative for dizziness and headaches.  Hematological:  Negative for swollen glands.  Psychiatric/Behavioral:  Positive for sleep disturbance. Negative for depressed mood. The  patient is not nervous/anxious.     PMFS History:  Patient Active Problem List   Diagnosis Date Noted   High risk medication use 12/05/2022   Screening for tuberculosis 12/05/2022   Anxiety 11/08/2022   Seronegative rheumatoid arthritis (HCC) 11/08/2022   History of pulmonary embolism 11/17/2021   Chronic systolic heart failure (HCC) 11/17/2021    Hyperlipidemia 06/16/2021   Dilated cardiomyopathy (HCC) 12/16/2020   Family history of early CAD 12/16/2020   Hypogonadism male 11/24/2015   Gout 11/01/2014   Essential hypertension 03/10/2014    Past Medical History:  Diagnosis Date   Asthma    AS A CHILD ONLY   DVT (deep venous thrombosis) (HCC)    Foot pain, right    painful x 1 month and swelling   Idiopathic gout     Family History  Problem Relation Age of Onset   Healthy Mother    Heart disease Father    Past Surgical History:  Procedure Laterality Date   HERNIA REPAIR  07/13/2011   [facility]   INGUINAL HERNIA REPAIR  07/13/2011   Procedure: HERNIA REPAIR INGUINAL ADULT;  Surgeon: Wilmon Arms. Corliss Skains, MD;  Location: [facility];  Service: General;  Laterality: Right;   Social History   Social History Narrative   Married for 13 years.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   Immunization History  Administered Date(s) Administered   Tdap 03/07/2018     Objective: Vital Signs: BP (!) 158/108 (BP Location: Left Arm, Patient Position: Sitting, Cuff Size: Normal)   Pulse (!) 110   Resp 17   Ht 6\' 2"  (1.88 m)   Wt 244 lb 9.6 oz (110.9 kg)   BMI 31.40 kg/m    Physical Exam Vitals and nursing note reviewed.  Constitutional:      Appearance: He is well-developed.  HENT:     Head: Normocephalic and atraumatic.  Eyes:     Conjunctiva/sclera: Conjunctivae normal.     Pupils: Pupils are equal, round, and reactive to light.  Cardiovascular:     Rate and Rhythm: Normal rate and regular rhythm.     Heart sounds: Normal heart sounds.  Pulmonary:     Effort: Pulmonary effort is normal.     Breath sounds: Normal breath sounds.  Abdominal:     General: Bowel sounds are normal.     Palpations: Abdomen is soft.  Musculoskeletal:     Cervical back: Normal range of motion and neck supple.  Skin:    General: Skin is warm and dry.     Capillary Refill: Capillary refill takes less than 2 seconds.   Neurological:     Mental Status: He is alert and oriented to person, place, and time.  Psychiatric:        Behavior: Behavior normal.      Musculoskeletal Exam: Patient remained seated during the examination today.  C-spine has good ROM.  Shoulder joints have good ROM.  Painful ROM of both elbows, right worse than left.  Rheumatoid nodules noted on extensor surface of the left elbow.  Olecranon bursitis of both elbows.  Tenderness and inflammation of the right elbow and wrist joints.  Tenderness of several MCP and PIP joints of the right hand.  Painful and limited extension of the left knee.  Warmth and swelling of the left knee noted.  Synovial thickening of the right knee noted. Ankle joints have good ROM with some thickening but no active inflammation noted today.   CDAI Exam: CDAI Score: --  Patient Global: --; Provider Global: -- Swollen: 3 ; Tender: 8  Joint Exam 01/31/2023      Right  Left  Elbow  Swollen Tender     Wrist  Swollen Tender     MCP 2   Tender     MCP 3   Tender     MCP 4   Tender     PIP 2 (finger)   Tender     PIP 3 (finger)   Tender     Knee     Swollen Tender     Investigation: No additional findings.  Imaging: ECHOCARDIOGRAM COMPLETE  Result Date: 01/26/2023    ECHOCARDIOGRAM REPORT   Patient Name:   Ryan Gibson Date of Exam: 01/26/2023 Medical Rec #:  [id number]           Height:       74.0 in Accession #:    [id number]          Weight:       247.0 lb Date of Birth:  04/06/1975          BSA:          2.377 m Patient Age:    47 years            BP:           122/99 mmHg Patient Gender: M                   HR:           72 bpm. [facility] Procedure: 2D Echo, 3D Echo, Cardiac Doppler, Color Doppler and Strain Analysis Indications:    I50.40* Unspecified combined systolic (congestive) and diastolic                 (congestive) heart failure  History:        Patient has prior history of Echocardiogram examinations, most                  recent 02/16/2020. Cardiomyopathy and CHF,                 Signs/Symptoms:Shortness of Breath, Dyspnea and Fatigue; Risk                 Factors:Hypertension and Dyslipidemia. Pulmonary embolus.  Sonographer:    Sheralyn Boatman RDCS Referring Phys: 75 TESSA N CONTE  Sonographer Comments: Image acquisition challenging due to patient body habitus and Image acquisition challenging due to respiratory motion. IMPRESSIONS  1. Left ventricular ejection fraction, by estimation, is 50 to 55%. The left ventricle has low normal function. The left ventricle has no regional wall motion abnormalities. The left ventricular internal cavity size was mildly dilated. There is mild concentric left ventricular hypertrophy. Left ventricular diastolic parameters are consistent with Grade I diastolic dysfunction (impaired relaxation). The average left ventricular global longitudinal strain is -19.3 %. The global longitudinal strain is normal.  2. Right ventricular systolic function is normal. The right ventricular size is normal. Tricuspid regurgitation signal is inadequate for assessing PA pressure.  3. The mitral valve is grossly normal. Mild mitral valve regurgitation.  4. The aortic valve is tricuspid. Aortic valve regurgitation is not visualized. No aortic stenosis is present. Aortic valve mean gradient measures 3.0 mmHg.  5. Aortic dilatation noted. There is moderate dilatation of the aortic root, measuring 44 mm.  6. The inferior vena cava is normal in size with greater than 50% respiratory variability, suggesting right atrial pressure  of 3 mmHg. Comparison(s): Prior images reviewed side by side. LVEF improved significantly in comparison, now 50-55% range. Mitral regurgitation is mild. Moderately dilated aortic root. FINDINGS  Left Ventricle: Left ventricular ejection fraction, by estimation, is 50 to 55%. The left ventricle has low normal function. The left ventricle has no regional wall motion abnormalities. The average left  ventricular global longitudinal strain is -19.3 %. The global longitudinal strain is normal. The left ventricular internal cavity size was mildly dilated. There is mild concentric left ventricular hypertrophy. Left ventricular diastolic parameters are consistent with Grade I diastolic dysfunction (impaired relaxation). Right Ventricle: The right ventricular size is normal. No increase in right ventricular wall thickness. Right ventricular systolic function is normal. Tricuspid regurgitation signal is inadequate for assessing PA pressure. Left Atrium: Left atrial size was normal in size. Right Atrium: Right atrial size was normal in size. Pericardium: There is no evidence of pericardial effusion. Presence of epicardial fat layer. Mitral Valve: The mitral valve is grossly normal. Mild mitral valve regurgitation. Tricuspid Valve: The tricuspid valve is grossly normal. Tricuspid valve regurgitation is trivial. Aortic Valve: The aortic valve is tricuspid. There is mild aortic valve annular calcification. Aortic valve regurgitation is not visualized. No aortic stenosis is present. Aortic valve mean gradient measures 3.0 mmHg. Aortic valve peak gradient measures 5.1 mmHg. Aortic valve area, by VTI measures 3.51 cm. Pulmonic Valve: The pulmonic valve was grossly normal. Pulmonic valve regurgitation is trivial. Aorta: Aortic dilatation noted. There is moderate dilatation of the aortic root, measuring 44 mm. Venous: The inferior vena cava is normal in size with greater than 50% respiratory variability, suggesting right atrial pressure of 3 mmHg. IAS/Shunts: No atrial level shunt detected by color flow Doppler.  LEFT VENTRICLE PLAX 2D LVIDd:         6.20 cm      Diastology LVIDs:         5.20 cm      LV e' medial:    4.46 cm/s LV PW:         1.10 cm      LV E/e' medial:  11.1 LV IVS:        0.90 cm      LV e' lateral:   8.70 cm/s LVOT diam:     2.40 cm      LV E/e' lateral: 5.7 LV SV:         72 LV SV Index:   30           2D  Longitudinal Strain LVOT Area:     4.52 cm     2D Strain GLS Avg:     -19.3 %  LV Volumes (MOD) LV vol d, MOD A2C: 138.0 ml 3D Volume EF: LV vol d, MOD A4C: 113.0 ml 3D EF:        51 % LV vol s, MOD A2C: 62.5 ml  LV EDV:       214 ml LV vol s, MOD A4C: 55.6 ml  LV ESV:       105 ml LV SV MOD A2C:     75.5 ml  LV SV:        109 ml LV SV MOD A4C:     113.0 ml LV SV MOD BP:      66.7 ml RIGHT VENTRICLE RV S prime:     10.90 cm/s TAPSE (M-mode): 2.1 cm LEFT ATRIUM             Index  RIGHT ATRIUM           Index LA diam:        2.30 cm 0.97 cm/m   RA Area:     11.90 cm LA Vol (A2C):   71.1 ml 29.91 ml/m  RA Volume:   26.30 ml  11.06 ml/m LA Vol (A4C):   37.9 ml 15.94 ml/m LA Biplane Vol: 54.7 ml 23.01 ml/m  AORTIC VALVE AV Area (Vmax):    3.35 cm AV Area (Vmean):   3.31 cm AV Area (VTI):     3.51 cm AV Vmax:           113.00 cm/s AV Vmean:          73.300 cm/s AV VTI:            0.206 m AV Peak Grad:      5.1 mmHg AV Mean Grad:      3.0 mmHg LVOT Vmax:         83.70 cm/s LVOT Vmean:        53.600 cm/s LVOT VTI:          0.160 m LVOT/AV VTI ratio: 0.78  AORTA Ao Root diam: 4.40 cm Ao Asc diam:  3.65 cm MITRAL VALVE MV Area (PHT): 3.21 cm    SHUNTS MV Decel Time: 236 msec    Systemic VTI:  0.16 m MV E velocity: 49.70 cm/s  Systemic Diam: 2.40 cm MV A velocity: 72.80 cm/s MV E/A ratio:  0.68 Nona Dell MD Electronically signed by Nona Dell MD Signature Date/Time: 01/26/2023/4:11:39 PM    Final     Recent Labs: Lab Results  Component Value Date   WBC 8.5 12/21/2022   HGB 20.5 (H) 12/21/2022   PLT 258 12/21/2022   NA 140 01/24/2023   K 4.4 01/24/2023   CL 97 01/24/2023   CO2 25 01/24/2023   GLUCOSE 115 (H) 01/24/2023   BUN 13 01/24/2023   CREATININE 0.92 01/24/2023   BILITOT 1.2 12/14/2022   ALKPHOS 39 09/10/2022   AST 36 12/14/2022   ALT 42 12/14/2022   PROT 7.5 12/14/2022   ALBUMIN 3.8 09/10/2022   CALCIUM 9.5 01/24/2023   GFRAA 96 02/10/2020   QFTBGOLDPLUS NEGATIVE  12/14/2022    Speciality Comments: No specialty comments available.  Procedures:  No procedures performed Allergies: Vancomycin       Assessment / Plan:     Visit Diagnoses: Seronegative rheumatoid arthritis (HCC) - RF-, Anti-CCP-, elevated ESR and CRP, hx of achilles tendonitis, recurrent joint swelling/knee effusions, possible dactylitis of right 2nd toe: Patient presents today with significant pain involving multiple joints.  He is currently having a flare involving the right elbow, right wrist, and left knee joint.  He had significant difficulty performing ADLs.  At times he has had to use a walker to assist with ambulation.  He has been experiencing significant nocturnal pain in his right elbow and left knee.  He has been having to miss work frequently due to severity of flares.  A prednisone taper sent to the pharmacy on 01/23/23--he had an inadequate response to taking 20 mg so he increased the dose of prednisone for several days so he has run out of the prescription sooner then the taper was written for.  He is currently prescribed IV Orencia infusions every 4 weeks.  The most recent infusion was administered on 01/11/2023.  He has tolerated switching to IV Orencia but continues to have frequent and severe flares involving multiple joints.  His symptoms have been inadequately controlled on Orencia.  He is scheduled for his next infusion on 02/08/2023 which he plans on receiving.  He presented today to discuss treatment alternatives does not notice any benefit after his next infusion of Orencia.  According to the patient he remains under the care of cardiology and had a recent updated echocardiogram.  His ejection fraction has improved to 50 to 55%.  Patient questioned if he would be able to switch to a TNF inhibitor now that his ejection fraction has improved.  Plan to reach out to his cardiologist for clearance.  Also discussed with Dr. Corliss Skains today in the office and she was in agreement.   Discussed that he has not a good candidate for Jak inhibitors given history of DVT and PE.  We can consider applying for Enbrel if cleared by cardiology.  Considered Simponi Aria infusion but he is not a good candidate for concurrent use of methotrexate due to alcohol use and history of elevated Lfts.  An urgent referral to pain management will be placed today as requested for better pain control.  We will also place a referral to [facility] rheumatology for a second opinion as suggested by Dr. Corliss Skains.  A prednisone taper starting at 20 mg tapering by 5 mg every 2 weeks will also be sent to the pharmacy today as bridging therapy.  Advised patient to monitor blood pressure closely while taking prednisone.  Discussed the concern for high dose and long-term prednisone use. He will follow up in 6-8 weeks or sooner if needed. - Plan: Ambulatory referral to Physical Medicine Rehab, Ambulatory referral to Rheumatology  High risk medication use - IV Orencia infusion every 4 weeks--next infusion scheduled on 02/08/23. Discontinue sq orencia due to fatigue on weekly basis.  CBC and CMP updated on 12/14/22.  TB gold negative on 12/14/22.  Chest x-ray 02/16/2020. Not a good candidate for methotrexate or Arava due to alcohol use and elevated LFTs.  Not a good candidate for Jak inhibitors given history of PE and DVT. - Plan: Ambulatory referral to Rheumatology  Idiopathic chronic gout of multiple sites without tophus - Crystal proven gout dx [place] rhe[facility]. Treated with allopurinol initially d/c-SE. He remains on Uloric 80 mg daily and colchicine 0.6 mg 1 tablet by mouth daily.  A refill of Uloric will be sent to the pharmacy today as requested.- Plan: Ambulatory referral to Physical Medicine Rehab, Ambulatory referral to Rheumatology  Positive ANA (antinuclear antibody) - ANA is low titer positive and most likely not significant. No clinical features of systemic lupus. - Plan: Ambulatory referral to  Rheumatology  Pain in both wrists -Patient has tenderness palpation over the right wrist joint.  Limited extension of the right wrist noted.  He has had difficulty performing ADLs due to the severity of pain involving his right wrist and elbow joint.a prednisone taper starting at 20 mg tapering by 5 mg every 2 weeks was sent to the pharmacy.  Referral to pain management will also be placed.  Plan: Ambulatory referral to Physical Medicine Rehab, Ambulatory referral to Rheumatology  Chronic pain of left knee - Patient presents today experiencing significant pain and swelling involving the left knee joint.  He has continued to experience recurrent severe flares involving multiple joints but most consistently his left knee.  At times he has to use a walker to assist with ambulation.  He has had to miss work due to the severity of symptoms.  He is been experiencing significant nocturnal  pain involving the left knee.  Her referral to pain management was placed as requested.  A prednisone taper 2020 mg tapering by 5 mg every 2 weeks was also sent to the pharmacy.  He plans on receiving his Orencia infusion on 02/08/2023 but in the meantime we will try to obtain clearance from cardiology to initiate a TNF inhibitor in the future.  Plan: Ambulatory referral to Physical Medicine Rehab, Ambulatory referral to Rheumatology  Effusion, right knee - Synovial thickening of the right knee noted-no effusion apparent today.  Plan: Ambulatory referral to Rheumatology  Chronic pain of both ankles -Chronic pain and synovial thickening.  Plan: Ambulatory referral to Physical Medicine Rehab, Ambulatory referral to Rheumatology  Pain in both feet -Referral to pain management placed today.  Plan: Ambulatory referral to Rheumatology  Achilles tendinitis of left lower extremity - Intermittent discomfort. Plan: Ambulatory referral to Rheumatology  History of pulmonary embolism: Not a good candidate for Jak inhibitors.  He remains  on Eliquis as prescribed.  Depression, major, single episode, moderate (HCC): He is taking Cymbalta 60 mg 1 capsule daily.  He has been tolerating Cymbalta and has found it to be helpful at managing his mood.  He has not noticed any pain relieving effects with Cymbalta.  Chronic systolic heart failure [facility]): Patient had a recent echocardiogram performed on 01/26/2023: Left ventricular ejection fraction was 50 to 55%.  Right ventricular systolic function was normal.  Left ventricular ejection fraction improved significantly compared to scan from 2021.  Reviewed interpretation by Jari Favre PA-C 01/27/23--recommended close blood pressure monitoring.  Plan to reach out to see if he would be a candidate for [place] to switch him to a TNF inhibitor going forward.   Dilated cardiomyopathy (HCC)  Essential hypertension: Blood pressure was elevated today in the office.  His blood pressure was rechecked prior to leaving.  Patient was advised to contact his cardiologist or PCP if his blood pressure remains elevated.  Advised patient to monitor blood pressure closely while taking prednisone.  Polycythemia: New onset October 2024.  Reviewed hematology office visit note from 01/12/23-felt to be secondary to testosterone use.  MPN/PV ruled out.  Recommended phlebotomy every 6 months and continuing to monitor CBC every 6 months.  Other medical conditions are listed as follows:  History of hyperlipidemia  Hypogonadism male  Vitamin D deficiency  Other fatigue  Orders: Orders Placed This Encounter  Procedures   Ambulatory referral to Physical Medicine Rehab   Ambulatory referral to Rheumatology   Meds ordered this encounter  Medications   predniSONE (DELTASONE) 5 MG tablet    Sig: Take 4 tabs po qd x 14 days, 3  tabs po qd x 14 days, 2  tabs po qd x 14 days, 1  tab po qd x 14 days    Dispense:  140 tablet    Refill:  0    Follow-Up Instructions: Return in about 6 weeks (around 03/14/2023) for Rheumatoid  arthritis, Gout.   Gearldine Bienenstock, PA-C  Note - This record has been created using Dragon software.  Chart creation errors have been sought, but may not always  have been located. Such creation errors do not reflect on  the standard of medical care.

## 2023-01-20 ENCOUNTER — Other Ambulatory Visit: Payer: Self-pay | Admitting: Rheumatology

## 2023-01-20 ENCOUNTER — Other Ambulatory Visit: Payer: Self-pay | Admitting: Internal Medicine

## 2023-01-20 ENCOUNTER — Other Ambulatory Visit: Payer: Self-pay | Admitting: Physician Assistant

## 2023-01-20 DIAGNOSIS — M1A09X0 Idiopathic chronic gout, multiple sites, without tophus (tophi): Secondary | ICD-10-CM

## 2023-01-23 NOTE — Telephone Encounter (Signed)
Last Fill: 11/08/2022 (Cymbalta), 11/04/2022 (Uloric), 09/14/2022 (Colchicine)  Labs: 12/14/2022 Hemoglobin remains elevated. CMP is normal. 09/10/2022 Uric Acid 5.3  Next Visit: 01/31/2023  Last Visit: 12/01/2022  DX: Seronegative rheumatoid arthritis Idiopathic chronic gout of multiple sites without tophus Depression, major, single episode, moderate   Current Dose per office note 12/01/2022: Uloric 80 mg 1 tablet by mouth daily and colchicine 0.6 mg 1 tablet by mouth daily. Per my chart message 01/23/2023 Prednisone 20 mg tapering by 5 mg every 4 days.   Okay to refill Uloric, Cymbalta, Colchicine and Prednisone?

## 2023-01-23 NOTE — Progress Notes (Unsigned)
Cardiology Office Note:  .   Date:  01/24/2023  ID:  Ryan Gibson, DOB 02-22-1976, MRN [id number] PCP: Ryan Sams, DO  [facility] Providers Cardiologist:  None {   History of Present Illness: .   Ryan Gibson is a 47 y.o. male with a past medical history of hypertension, tobacco use disorder, alcohol abuse, strong family history of premature coronary artery disease his father had coronary artery disease in his 57s here for follow-up appointment.  Patient presented to [facility] in 01/2020 where he was diagnosed with acute bilateral pulmonary emboli, DVT with right lower extremity and severe dilated cardiomyopathy with LVEF less than 20% as well as acute cholecystitis.  Suspected DVT and PE are related to underlying COVID-19 infection.  Cardiomyopathy likely related to alcohol abuse.  Patient wants initiated on guideline directed medical therapy including Jardiance, metoprolol, and Entresto.  Since admission 12/21 patient has reduced alcohol intake and echocardiogram was repeated 06/2021 which revealed LVEF improved to 40 to 45%.  Patient was seen in the office 03/23/2021 at which time spironolactone 12.5 mg was added.  Patient subsequently increased mental acting on to 25 mg daily.  Currently taking furosemide 40 mg daily.  Had recent labs done and BMP remained stable.  Patient is overall feeling very well without any specific complaints.  Denies chest pain, orthopnea, dyspnea, leg edema.  Continue to reduce alcohol intake and was drinking 1-2 beers per week.  This was 08/04/2021.  He was seen by me 11/23/22, he tells me that he turned in paperwork for patient assistance a year ago with Dr. Verl Dicker office and never heard anything back.  He has been living off of samples since January and he is intermittently been taking his medications.  Today blood pressure is 178/120.  I told him that I should be sending him to the ER but he refused to go.  Out of all his medications.  Did not take  anything this morning.  Heart rate is in the 90s.  We sent refills of Lasix, metoprolol, and spironolactone today and we have given him coupons for his Eliquis, Jardiance, and Entresto.  Unfortunately, we do not have any samples available in the office of these medications.  I reach out to social work to follow-up with him.  He stopped taking his Crestor because it "made him feel bad".  He is now doing testosterone supplementation for fatigue.  However, we discussed that his fatigue could very well be multifactorial.  He is on prednisone for his rheumatoid arthritis  I also sent in a prescription for valsartan 160 mg daily just in case he is unable to obtain Entresto in the next few days.  Obviously, would prefer Entresto but patient does need blood pressure medication at all times.  This was emphasized to him.  Today, he presents with a history of heart disease and rheumatoid arthritis with a flare of rheumatoid arthritis, experiencing severe pain in the knee and wrist. The patient also reports a sensation of his heart "swelling" and then "taking off again," a symptom he previously experienced when his heart function was significantly reduced. The patient has been self-adjusting his dose of Lasix, a diuretic medication, due to perceived lack of efficacy at the prescribed dose. The patient has also been undergoing evaluation by an oncologist for potential re-diagnosis with Stills disease.   Reports no chest pain, pressure, or tightness. No edema, orthopnea, PND.   Discussed the use of AI scribe software for clinical note transcription  with the patient, who gave verbal consent to proceed.   ROS: Pertinent ROS in HPI  Studies Reviewed: .        Echocardiogram 03/01/2022:  Left ventricle cavity is mildly dilated. Normal left ventricular wall  thickness. Hypokinetic global wall motion. Abnormal septal wall motion due  to left bundle branch block. Normal diastolic filling pattern. Moderately   depressed LV systolic function with visual EF 35-40%. Calculated EF 43%.  The aortic root is mildly dilated at 4.2 cm at sinus of valsalva.  Ascending aorta is normal in size. .  Compared to the study done on 07/13/2021, no significant change, mild  aortic root dilatation at sinus of Valsalva is new.       Physical Exam:   VS:  BP (!) 122/100   Pulse 65   Ht 6\' 2"  (1.88 m)   Wt 247 lb (112 kg)   SpO2 100%   BMI 31.71 kg/m    Wt Readings from Last 3 Encounters:  01/24/23 247 lb (112 kg)  12/21/22 242 lb (109.8 kg)  12/01/22 249 lb (112.9 kg)    GEN: Well nourished, well developed in no acute distress NECK: No JVD; No carotid bruits CARDIAC: RRR, no murmurs, rubs, gallops RESPIRATORY:  Clear to auscultation without rales, wheezing or rhonchi  ABDOMEN: Soft, non-tender, non-distended EXTREMITIES:  No edema; No deformity   ASSESSMENT AND PLAN: .   Acute pulmonary embolism -He has been on Eliquis intermittently due to cost -We provided him with a coupon today and highly encouraged that he maintains compliance  Chronic systolic Heart Failure Patient reports feeling unwell, with symptoms similar to when ejection fraction was around 20%. Currently on Entresto, Jardiance, Spironolactone, and Lasix (self-adjusted to 80mg  daily). -Order echocardiogram to assess current ejection fraction. -Order BMP/BNP to assess kidney function and electrolyte status due to increased Lasix dose. -Adjust Lasix prescription to 80mg  daily, with instructions to notify office if this dose is needed consistently. -Euvolemic on exam today  Rheumatoid Arthritis Current flare with involvement of knee and wrist, causing severe pain. On short course of prednisone. -Continue current treatment plan under rheumatologist.  Hypertension Elevated diastolic blood pressure, possibly due to RA flare and prednisone use. -Continue current antihypertensive regimen (Metoprolol 150mg  daily) and Entresto 49-51mg   daily  Possible Carotid Artery Issue Patient reports feeling like blood flow is cut off when turning head in one direction. -Order carotid artery ultrasound for further evaluation. -No bruit on exam  Dependent Edema Patient reports swelling in legs when sitting all day. -Advise use of compression stockings, particularly on days with prolonged sitting. -Continue current diuretic regimen (Lasix, Spironolactone, Jardiance).       Dispo: He an follow-up in 2-3 months with APP   Signed, Sharlene Dory, PA-C

## 2023-01-23 NOTE — Telephone Encounter (Signed)
Ok to send in prednisone 20 mg tapering by 5 mg every 4 days.  Take prednisone in the morning with food and avoid the use of NSAIDs.

## 2023-01-24 ENCOUNTER — Ambulatory Visit: Payer: BC Managed Care – PPO | Attending: Physician Assistant | Admitting: Physician Assistant

## 2023-01-24 ENCOUNTER — Encounter: Payer: Self-pay | Admitting: Physician Assistant

## 2023-01-24 VITALS — BP 122/100 | HR 65 | Ht 74.0 in | Wt 247.0 lb

## 2023-01-24 DIAGNOSIS — I5022 Chronic systolic (congestive) heart failure: Secondary | ICD-10-CM | POA: Diagnosis not present

## 2023-01-24 DIAGNOSIS — R531 Weakness: Secondary | ICD-10-CM

## 2023-01-24 DIAGNOSIS — R0609 Other forms of dyspnea: Secondary | ICD-10-CM | POA: Diagnosis not present

## 2023-01-24 DIAGNOSIS — I10 Essential (primary) hypertension: Secondary | ICD-10-CM

## 2023-01-24 DIAGNOSIS — Z86711 Personal history of pulmonary embolism: Secondary | ICD-10-CM | POA: Diagnosis not present

## 2023-01-24 DIAGNOSIS — I420 Dilated cardiomyopathy: Secondary | ICD-10-CM | POA: Diagnosis not present

## 2023-01-24 DIAGNOSIS — E782 Mixed hyperlipidemia: Secondary | ICD-10-CM

## 2023-01-24 NOTE — Patient Instructions (Addendum)
Medication Instructions:  Your physician recommends that you continue on your current medications as directed. Please refer to the Current Medication list given to you today.  *If you need a refill on your cardiac medications before your next appointment, please call your pharmacy*   Lab Work: TODAY:  BMET & PRO BNP  If you have labs (blood work) drawn today and your tests are completely normal, you will receive your results only by: MyChart Message (if you have MyChart) OR A paper copy in the mail If you have any lab test that is abnormal or we need to change your treatment, we will call you to review the results.   Testing/Procedures: Your physician has requested that you have an echocardiogram STAT ASAP. Echocardiography is a painless test that uses sound waves to create images of your heart. It provides your doctor with information about the size and shape of your heart and how well your heart's chambers and valves are working. This procedure takes approximately one hour. There are no restrictions for this procedure. Please do NOT wear cologne, perfume, aftershave, or lotions (deodorant is allowed). Please arrive 15 minutes prior to your appointment time.  Please note: We ask at that you not bring children with you during ultrasound (echo/ vascular) testing. Due to room size and safety concerns, children are not allowed in the ultrasound rooms during exams. Our front office staff cannot provide observation of children in our lobby area while testing is being conducted. An adult accompanying a patient to their appointment will only be allowed in the ultrasound room at the discretion of the ultrasound technician under special circumstances. We apologize for any inconvenience.   Your physician has requested that you have a carotid duplex. This test is an ultrasound of the carotid arteries in your neck. It looks at blood flow through these arteries that supply the brain with blood. Allow one  hour for this exam. There are no restrictions or special instructions.    Follow-Up: At [facility], you and your health needs are our priority.  As part of our continuing mission to provide you with exceptional heart care, we have created designated Provider Care Teams.  These Care Teams include your primary Cardiologist (physician) and Advanced Practice Providers (APPs -  Physician Assistants and Nurse Practitioners) who all work together to provide you with the care you need, when you need it.  We recommend signing up for the patient portal called "MyChart".  Sign up information is provided on this After Visit Summary.  MyChart is used to connect with patients for Virtual Visits (Telemedicine).  Patients are able to view lab/test results, encounter notes, upcoming appointments, etc.  Non-urgent messages can be sent to your provider as well.   To learn more about what you can do with MyChart, go to [email or web address].    Your next appointment:   2-3 month(s)  Provider:      Dr. Jacinto Halim / Jari Favre, NP Other Instructions

## 2023-01-25 LAB — BASIC METABOLIC PANEL
BUN/Creatinine Ratio: 14 (ref 9–20)
BUN: 13 mg/dL (ref 6–24)
CO2: 25 mmol/L (ref 20–29)
Calcium: 9.5 mg/dL (ref 8.7–10.2)
Chloride: 97 mmol/L (ref 96–106)
Creatinine, Ser: 0.92 mg/dL (ref 0.76–1.27)
Glucose: 115 mg/dL — ABNORMAL HIGH (ref 70–99)
Potassium: 4.4 mmol/L (ref 3.5–5.2)
Sodium: 140 mmol/L (ref 134–144)
eGFR: 103 mL/min/{1.73_m2} (ref >59)

## 2023-01-25 LAB — PRO B NATRIURETIC PEPTIDE: NT-Pro BNP: 243 pg/mL — ABNORMAL HIGH (ref 0–121)

## 2023-01-26 ENCOUNTER — Ambulatory Visit (HOSPITAL_COMMUNITY)
Admission: RE | Admit: 2023-01-26 | Discharge: 2023-01-26 | Disposition: A | Discharge status: Home / Self Care | Payer: BC Managed Care – PPO | Source: Ambulatory Visit | Attending: Physician Assistant | Admitting: Physician Assistant

## 2023-01-26 DIAGNOSIS — I10 Essential (primary) hypertension: Secondary | ICD-10-CM | POA: Insufficient documentation

## 2023-01-26 DIAGNOSIS — I5022 Chronic systolic (congestive) heart failure: Secondary | ICD-10-CM | POA: Diagnosis not present

## 2023-01-26 DIAGNOSIS — R531 Weakness: Secondary | ICD-10-CM | POA: Insufficient documentation

## 2023-01-26 DIAGNOSIS — I420 Dilated cardiomyopathy: Secondary | ICD-10-CM | POA: Diagnosis not present

## 2023-01-26 DIAGNOSIS — R0609 Other forms of dyspnea: Secondary | ICD-10-CM | POA: Insufficient documentation

## 2023-01-26 DIAGNOSIS — E782 Mixed hyperlipidemia: Secondary | ICD-10-CM | POA: Insufficient documentation

## 2023-01-26 DIAGNOSIS — Z86711 Personal history of pulmonary embolism: Secondary | ICD-10-CM | POA: Diagnosis not present

## 2023-01-26 LAB — ECHOCARDIOGRAM COMPLETE
AR max vel: 3.35 cm2
AV Area VTI: 3.51 cm2
AV Area mean vel: 3.31 cm2
AV Mean grad: 3 mm[Hg]
AV Peak grad: 5.1 mm[Hg]
Ao pk vel: 1.13 m/s
Area-P 1/2: 3.21 cm2
Calc EF: 52.6 %
S' Lateral: 5.2 cm
Single Plane A2C EF: 54.7 %
Single Plane A4C EF: 50.8 %

## 2023-01-26 NOTE — Progress Notes (Signed)
  Echocardiogram 2D Echocardiogram has been performed.  Janalyn Harder 01/26/2023, 3:03 PM

## 2023-01-31 ENCOUNTER — Encounter: Payer: BC Managed Care – PPO | Attending: Rheumatology | Admitting: Physician Assistant

## 2023-01-31 ENCOUNTER — Encounter: Payer: Self-pay | Admitting: Physician Assistant

## 2023-01-31 VITALS — BP 158/108 | HR 110 | Resp 17 | Ht 74.0 in | Wt 244.6 lb

## 2023-01-31 DIAGNOSIS — I10 Essential (primary) hypertension: Secondary | ICD-10-CM | POA: Insufficient documentation

## 2023-01-31 DIAGNOSIS — Z79899 Other long term (current) drug therapy: Secondary | ICD-10-CM | POA: Insufficient documentation

## 2023-01-31 DIAGNOSIS — M7662 Achilles tendinitis, left leg: Secondary | ICD-10-CM | POA: Insufficient documentation

## 2023-01-31 DIAGNOSIS — G8929 Other chronic pain: Secondary | ICD-10-CM | POA: Insufficient documentation

## 2023-01-31 DIAGNOSIS — M0600 Rheumatoid arthritis without rheumatoid factor, unspecified site: Secondary | ICD-10-CM | POA: Insufficient documentation

## 2023-01-31 DIAGNOSIS — M79671 Pain in right foot: Secondary | ICD-10-CM | POA: Insufficient documentation

## 2023-01-31 DIAGNOSIS — R5383 Other fatigue: Secondary | ICD-10-CM | POA: Insufficient documentation

## 2023-01-31 DIAGNOSIS — Z86711 Personal history of pulmonary embolism: Secondary | ICD-10-CM | POA: Insufficient documentation

## 2023-01-31 DIAGNOSIS — Z8639 Personal history of other endocrine, nutritional and metabolic disease: Secondary | ICD-10-CM | POA: Insufficient documentation

## 2023-01-31 DIAGNOSIS — M1A09X0 Idiopathic chronic gout, multiple sites, without tophus (tophi): Secondary | ICD-10-CM | POA: Diagnosis not present

## 2023-01-31 DIAGNOSIS — D751 Secondary polycythemia: Secondary | ICD-10-CM | POA: Insufficient documentation

## 2023-01-31 DIAGNOSIS — E291 Testicular hypofunction: Secondary | ICD-10-CM | POA: Insufficient documentation

## 2023-01-31 DIAGNOSIS — M25572 Pain in left ankle and joints of left foot: Secondary | ICD-10-CM | POA: Insufficient documentation

## 2023-01-31 DIAGNOSIS — E559 Vitamin D deficiency, unspecified: Secondary | ICD-10-CM | POA: Insufficient documentation

## 2023-01-31 DIAGNOSIS — R768 Other specified abnormal immunological findings in serum: Secondary | ICD-10-CM | POA: Diagnosis not present

## 2023-01-31 DIAGNOSIS — M79672 Pain in left foot: Secondary | ICD-10-CM | POA: Insufficient documentation

## 2023-01-31 DIAGNOSIS — M25562 Pain in left knee: Secondary | ICD-10-CM | POA: Insufficient documentation

## 2023-01-31 DIAGNOSIS — I420 Dilated cardiomyopathy: Secondary | ICD-10-CM | POA: Insufficient documentation

## 2023-01-31 DIAGNOSIS — Z5181 Encounter for therapeutic drug level monitoring: Secondary | ICD-10-CM

## 2023-01-31 DIAGNOSIS — I5022 Chronic systolic (congestive) heart failure: Secondary | ICD-10-CM | POA: Insufficient documentation

## 2023-01-31 DIAGNOSIS — F321 Major depressive disorder, single episode, moderate: Secondary | ICD-10-CM | POA: Insufficient documentation

## 2023-01-31 DIAGNOSIS — M25461 Effusion, right knee: Secondary | ICD-10-CM | POA: Insufficient documentation

## 2023-01-31 DIAGNOSIS — M25571 Pain in right ankle and joints of right foot: Secondary | ICD-10-CM | POA: Insufficient documentation

## 2023-01-31 DIAGNOSIS — M25532 Pain in left wrist: Secondary | ICD-10-CM | POA: Insufficient documentation

## 2023-01-31 DIAGNOSIS — M25531 Pain in right wrist: Secondary | ICD-10-CM | POA: Insufficient documentation

## 2023-01-31 DIAGNOSIS — B353 Tinea pedis: Secondary | ICD-10-CM

## 2023-01-31 MED ORDER — PREDNISONE 5 MG PO TABS: ORAL_TABLET | ORAL | 0 refills | Status: DC

## 2023-02-01 ENCOUNTER — Telehealth: Payer: Self-pay | Admitting: Pharmacist

## 2023-02-01 ENCOUNTER — Telehealth: Payer: Self-pay | Admitting: Physician Assistant

## 2023-02-01 NOTE — Telephone Encounter (Signed)
I reached out to the patients cardiology team Jari Favre, PA-C and Dr. Jacinto Halim) and they are ok with [place] proceeding with the initiation of Enbrel.   Ok to start approval process and obtain consent at new start visit.   Notified Chesley Mires, RPH.   Marissa-please call the patient to inform the patient of the plan.   Dr. Jacinto Halim recommends repeating the echocardiogram in 6 months after starting Enbrel.   We will monitor for any new or worsening symptoms indicating an exacerbation.    Sherron Ales, PA-C

## 2023-02-01 NOTE — Telephone Encounter (Addendum)
Enbrel BIV started  Chesley Mires, PharmD, MPH, BCPS, CPP Clinical Pharmacist (Rheumatology and Pulmonology)

## 2023-02-01 NOTE — Telephone Encounter (Signed)
-----   Message from Gearldine Bienenstock sent at 02/01/2023  3:55 PM EST ----- Regarding: FW: Clearance to start TNFi Please see messages below.  Can you start approval process for Enbrel? I discussed TNFi options yesterday with the patient, but he will need to sign consent form at new start visit. ----- Message ----- From: Yates Decamp, MD Sent: 02/01/2023  10:18 AM EST To: Gearldine Bienenstock, PA-C; Sharlene Dory, PA-C Subject: RE: Clearance to start TNFi                    I am fine starting the medication and let us know if new symptoms so we can see him. Otherwise plan on repeating Echo in 6 months with diagnosis of therapeutic drug monitoring and non-ischemic cardiomyopathy. ----- Message ----- From: Sharlene Dory, PA-C Sent: 02/01/2023   9:12 AM EST To: Yates Decamp, MD Subject: FW: Clearance to start TNFi                    Hi Dr. Jacinto Halim,   Can you weigh in on this message below. I'm unfamiliar with the cardiovascular side effects of Enbrel.   Thanks!  Sharlene Dory, PA-C ----- Message ----- From: Gearldine Bienenstock, PA-C Sent: 01/31/2023   4:19 PM EST To: Sharlene Dory, PA-C Subject: Clearance to start TNFi                        Hi Tessa,   I am reaching out about our mutual patient, Ryan Gibson.  I reviewed his recent echocardiogram results and noticed that his EF has improved (50-55%).  His rheumatoid arthritis is currently inadequately controlled on IV Orencia, so Dr. Corliss Skains and I were considering switching the patient to Enbrel.  Unfortunately with TNF inhibitors there is a concern for an exacerbation of heart failure in patients with class III and IV HF.  I wanted to reach out to see if from a cardiology standpoint it is okay to proceed with Enbrel in the future?  His treatment options are limited since he is unable to take Jak inhibitors given his history of PE and DVT. Please let me know your thoughts.  Thank you in advance,  Sherron Ales, PA-C

## 2023-02-01 NOTE — Telephone Encounter (Signed)
Submitted an URGENT Prior Authorization request to [facility] for ENBREL via CoverMyMeds. Will update once we receive a response.  Key: [id number]   Chesley Mires, PharmD, MPH, BCPS, CPP Clinical Pharmacist (Rheumatology and Pulmonology)

## 2023-02-02 NOTE — Telephone Encounter (Signed)
Enrolled patient into Enbrel copay card: BIN: [id number] PCN: [id number] Group: [id number] ID: [id number]  Chesley Mires, PharmD, MPH, BCPS, CPP Clinical Pharmacist (Rheumatology and Pulmonology)

## 2023-02-02 NOTE — Telephone Encounter (Signed)
Devki spoke with the patient this morning and informed him of the current plan.

## 2023-02-02 NOTE — Telephone Encounter (Signed)
Received notification from [facility] regarding a prior authorization for ENBREL. Authorization has been APPROVED from 02/01/23 to 02/01/24. Approval letter sent to scan center.  Patient must fill through [facility]: [phone]  Authorization # [id number]  Counseled patient that Enbrel is a TNF blocking agent.  Counseled patient on purpose, proper use, and adverse effects of Enbrel.  Reviewed the most common adverse effects including infections, headache, and injection site reactions.  Discussed that there is the possibility of an increased risk of malignancy including non-melanoma skin cancer but it is not well understood if this increased risk is due to the medication or the disease state.  Advised patient to get yearly dermatology exams due to risk of skin cancer.  Counseled patient that Enbrel should be held prior to scheduled surgery.  Counseled patient to avoid live vaccines while on Enbrel.  Recommend annual influenza, PCV 15 or PCV20 or Pneumovax 23, and Shingrix as indicated.  Reviewed the importance of regular labs while on Enbrel therapy.  Will monitor CBC and CMP 1 month after starting and then every 3 months routinely thereafter. Will monitor TB gold annually. Patient VERBALLY consented to Enbrel.  Will obtain consent at new start visit on 02/08/23  Dermatology referral will be placed at new start visit.

## 2023-02-03 NOTE — Progress Notes (Deleted)
Pharmacy Note  Subjective:   Patient presents to clinic today to receive first dose of Enbrel for rheumatoid arthritis. Patient is currently taking prednisone taper. His last Orencia infusion was on 01/11/2023. Initially we had avoided TNF inhibitors due to his CHF history however given patient's minimal clinical response to Ryan Benton, PA-C reached out to cardiology for input. Dr. Jacinto Halim has cleared patient to start with close monitoring of heart failure status (repeat ECHO in 6 months)  Patient running a fever or have signs/symptoms of infection? {yes/no:20286}  Patient currently on antibiotics for the treatment of infection? {yes/no:20286}  Patient have any upcoming invasive procedures/surgeries? {yes/no:20286}  Objective: CMP     Component Value Date/Time   NA 140 01/24/2023 1118   K 4.4 01/24/2023 1118   CL 97 01/24/2023 1118   CO2 25 01/24/2023 1118   GLUCOSE 115 (H) 01/24/2023 1118   GLUCOSE 116 (H) 12/14/2022 0848   BUN 13 01/24/2023 1118   CREATININE 0.92 01/24/2023 1118   CREATININE 0.96 12/14/2022 0848   CALCIUM 9.5 01/24/2023 1118   PROT 7.5 12/14/2022 0848   PROT 7.4 04/28/2022 1640   ALBUMIN 3.8 09/10/2022 0359   ALBUMIN 4.8 04/28/2022 1640   AST 36 12/14/2022 0848   ALT 42 12/14/2022 0848   ALKPHOS 39 09/10/2022 0359   BILITOT 1.2 12/14/2022 0848   BILITOT 0.6 04/28/2022 1640   GFRNONAA >60 09/10/2022 0359   GFRNONAA 83 02/10/2020 1525   GFRAA 96 02/10/2020 1525    CBC    Component Value Date/Time   WBC 8.5 12/21/2022 1339   WBC 5.9 12/14/2022 0848   RBC 6.09 (H) 12/21/2022 1339   HGB 20.5 (H) 12/21/2022 1339   HGB 15.5 04/28/2022 1640   HCT 56.2 (H) 12/21/2022 1339   HCT 43.6 04/28/2022 1640   PLT 258 12/21/2022 1339   PLT 221 04/28/2022 1640   MCV 92.3 12/21/2022 1339   MCV 95 04/28/2022 1640   MCH 33.7 12/21/2022 1339   MCHC 36.5 (H) 12/21/2022 1339   RDW 12.1 12/21/2022 1339   RDW 13.5 04/28/2022 1640   LYMPHSABS 2.7 12/21/2022 1339    LYMPHSABS 3.2 (H) 04/28/2022 1640   MONOABS 0.7 12/21/2022 1339   EOSABS 0.1 12/21/2022 1339   EOSABS 0.2 04/28/2022 1640   BASOSABS 0.1 12/21/2022 1339   BASOSABS 0.1 04/28/2022 1640    Baseline Immunosuppressant Therapy Labs TB GOLD    Latest Ref Rng & Units 12/14/2022    8:48 AM  Quantiferon TB Gold  Quantiferon TB Gold Plus NEGATIVE NEGATIVE    Hepatitis Panel    Latest Ref Rng & Units 02/23/2022   11:55 AM  Hepatitis  Hep B Surface Ag NON-REACTIVE NON-REACTIVE   Hep B IgM NON-REACTIVE NON-REACTIVE   Hep C Ab NON-REACTIVE NON-REACTIVE    HIV Lab Results  Component Value Date   HIV Non Reactive 09/10/2022   HIV Non Reactive 02/16/2020   Immunoglobulins    Latest Ref Rng & Units 02/23/2022   11:55 AM  Immunoglobulin Electrophoresis  IgA  47 - 310 mg/dL 161   IgG 096 - 0,454 mg/dL 0,981   IgM 50 - 191 mg/dL 478    SPEP    Latest Ref Rng & Units 12/14/2022    8:48 AM  Serum Protein Electrophoresis  Total Protein 6.1 - 8.1 g/dL 7.5    G9FA No results found for: "G6PDH" TPMT No results found for: "TPMT"   Chest x-ray: ***  Assessment/Plan:  Counseled patient that Enbrel is  a TNF blocking agent.  Counseled patient on purpose, proper use, and adverse effects of Enbrel.  Reviewed the most common adverse effects including infections, headache, and injection site reactions.  Discussed that there is the possibility of an increased risk of malignancy including non-melanoma skin cancer but it is not well understood if this increased risk is due to the medication or the disease state.  Advised patient to get yearly dermatology exams due to risk of skin cancer.  Counseled patient that Enbrel should be held prior to scheduled surgery.  Counseled patient to avoid live vaccines while on Enbrel.  Recommend annual influenza, PCV 15 or PCV20 or Pneumovax 23, and Shingrix as indicated.  Reviewed the importance of regular labs while on Enbrel therapy.  Will monitor CBC and CMP 1  month after starting and then every 3 months routinely thereafter. Will monitor TB gold annually. Standing orders placed. Provided patient with medication education material and answered all questions.  Patient consented to Enbrel.  Will upload consent into the media tab.  Reviewed storage instructions for Enbrel.   Per Dr. Ganji/cardiology: repeat ECHO in 6 months to assess heart failure status  Reviewed importance of holding Enbrel with signs/symptoms of an infections, if antibiotics are prescribed to treat an active infection, and with invasive procedures  Demonstrated proper injection technique with Enbrel Mini and Autotouch demo device  Patient able to demonstrate proper injection technique using the teach back method.  Patient self injected in the {injsitedsg:28167} with:  Sample Medication: Enbrel Autotouch Injector Device (patient to keep) NDC: *** Lot: *** Expiration: ***  Sample Medication: Enbrel Mini 50mg /mL cartrdige NDC: *** Lot: *** Expiration: ***  Patient tolerated well.  Observed for 30 mins in office for adverse reaction. {injectionreaction:30756}  Patient is to return in 1 month for labs and 6-8 weeks for follow-up appointment.  Standing orders for CBC/CMP placed.  TB gold will be monitored yearly. Referral to *** Dermatology placed today for yearly skin checks while on TNF inhibitor due to risk for non melanoma skin cancer  Enbrel approved through insurance .   [facility]: [phone].  Patient provided with pharmacy phone number and advised to call later this week to schedule shipment to home.  Patient will continue Enbrel 50mg  subcut every 7 days as monotherapy. Plan to complete prednisone taper as prescribed  All questions encouraged and answered.  Instructed patient to call with any further questions or concerns.  Chesley Mires, PharmD, MPH, BCPS, CPP Clinical Pharmacist (Rheumatology and Pulmonology)  02/03/2023 1:12 PM

## 2023-02-07 ENCOUNTER — Ambulatory Visit (INDEPENDENT_AMBULATORY_CARE_PROVIDER_SITE_OTHER): Payer: BC Managed Care – PPO | Admitting: Family Medicine

## 2023-02-07 DIAGNOSIS — D751 Secondary polycythemia: Secondary | ICD-10-CM | POA: Diagnosis not present

## 2023-02-07 DIAGNOSIS — Z1211 Encounter for screening for malignant neoplasm of colon: Secondary | ICD-10-CM

## 2023-02-07 DIAGNOSIS — I10 Essential (primary) hypertension: Secondary | ICD-10-CM

## 2023-02-07 DIAGNOSIS — M0600 Rheumatoid arthritis without rheumatoid factor, unspecified site: Secondary | ICD-10-CM

## 2023-02-07 DIAGNOSIS — I5022 Chronic systolic (congestive) heart failure: Secondary | ICD-10-CM

## 2023-02-07 DIAGNOSIS — E785 Hyperlipidemia, unspecified: Secondary | ICD-10-CM | POA: Diagnosis not present

## 2023-02-07 MED ORDER — OXYCODONE-ACETAMINOPHEN 5-325 MG PO TABS: 1.0000 | ORAL_TABLET | Freq: Three times a day (TID) | ORAL | 0 refills | Status: DC | PRN

## 2023-02-07 NOTE — Assessment & Plan Note (Addendum)
Brief supply of pain medication given to the patient while awaiting appointment with pain management.  Information was given to the patient regarding how to reach out to pain management.  [place] controlled substance database was reviewed today.

## 2023-02-07 NOTE — Assessment & Plan Note (Signed)
Stable. Continue medications

## 2023-02-07 NOTE — Patient Instructions (Addendum)
Labs in January.  No more testosterone.  Call [phone] to schedule appt for pain management.  Follow up in 1 month.

## 2023-02-07 NOTE — Assessment & Plan Note (Signed)
Currently euvolemic and improved.  Continue current medications.

## 2023-02-07 NOTE — Assessment & Plan Note (Signed)
Labs have been ordered as recommended by hematology/oncology.  Discontinuing testosterone.

## 2023-02-07 NOTE — Progress Notes (Addendum)
Subjective:  Patient ID: Ryan Gibson, male    DOB: [birth year]  Age: 47 y.o. 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  Patient is awaiting appointment with pain management.  He has been referred by rheumatology.  He states that he is also going to get a second opinion at [facility].  He would like something for times where he has severe pain associated with his joint issues.  Patient has known erythrocytosis which has been thought to be from testosterone.  He is no longer seeing hematology/oncology and they have recommended regular follow-up with me regarding his labs.  He needs iron panel and ferritin in the near future.  He has not taken testosterone in approximately 1 month.  CHF is improved.  He is euvolemic.  Most recent EF 50 to 55%.  He is on Entresto, spironolactone, metoprolol, Jardiance, and Lasix.  There has been issues with compliance in the past.  Patient desires colonoscopy.  He would like referral.  No current chest pain or shortness of breath.  Patient Active Problem List   Diagnosis Date Noted   Erythrocytosis 02/07/2023   High risk medication use 12/05/2022   Screening for tuberculosis 12/05/2022   Anxiety 11/08/2022   Seronegative rheumatoid arthritis (HCC) 11/08/2022   History of pulmonary embolism 11/17/2021   Chronic systolic heart failure (HCC) 11/17/2021   Hyperlipidemia 06/16/2021   Dilated cardiomyopathy (HCC) 12/16/2020   Family history of early CAD 12/16/2020   Hypogonadism male 11/24/2015   Gout 11/01/2014   Essential hypertension  03/10/2014    Social Hx   Social History   Socioeconomic History   Marital status: Married    Spouse name: Not on file   Number of children: 4   Years of education: Not on file   Highest education level: Associate degree: occupational, Scientist, product/process development, or vocational program  Occupational History   Not on file  Tobacco Use   Smoking status: Never    Passive exposure: Never   Smokeless tobacco: Current    Types: Chew  Vaping Use   Vaping status: Never Used  Substance and Sexual Activity   Alcohol use: Yes    Comment: Social   Drug use: No   Sexual activity: Yes    Birth control/protection: None    Comment: Married  Other Topics Concern   Not on file  Social History Narrative   Married for 13 years.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   Social Drivers of Health   Financial Resource Strain: Medium Risk (02/07/2023)   Overall Financial Resource Strain (CARDIA)    Difficulty of Paying Living Expenses: Somewhat hard  Food Insecurity: No Food Insecurity (02/07/2023)   Hunger Vital Sign    Worried About Running Out of Food in the Last Year: Never true    Ran Out of Food in the Last Year: Never true  Transportation Needs: No Transportation Needs (02/07/2023)   PRAPARE - Administrator, Civil Service (Medical): No    Lack of Transportation (Non-Medical): No  Physical Activity: Insufficiently Active (02/07/2023)   Exercise Vital Sign  Days of Exercise per Week: 3 days    Minutes of Exercise per Session: 30 min  Stress: No Stress Concern Present (02/07/2023)   [employer] of Occupational Health - Occupational Stress Questionnaire    Feeling of Stress : Only a little  Social Connections: Socially Integrated (02/07/2023)   Social Connection and Isolation Panel [NHANES]    Frequency of Communication with Friends and Family: More than three times a week    Frequency of Social Gatherings with Friends and Family: Once a week     Attends Religious Services: More than 4 times per year    Active Member of [employer] or Organizations: Yes    Attends Engineer, structural: More than 4 times per year    Marital Status: Married    Review of Systems  Musculoskeletal:  Positive for arthralgias.   Per HPI  Objective:  BP 136/88   Pulse 92   Ht 6\' 2"  (1.88 m)   Wt 242 lb (109.8 kg)   SpO2 95%   BMI 31.07 kg/m      02/07/2023   10:48 AM 02/07/2023   10:10 AM 01/31/2023    9:27 AM  BP/Weight  Systolic BP 136 136 158  Diastolic BP 88 97 108  Wt. (Lbs)  242   BMI  31.07 kg/m2     Physical Exam Vitals and nursing note reviewed.  Constitutional:      General: He is not in acute distress.    Appearance: Normal appearance.  HENT:     Head: Normocephalic and atraumatic.  Eyes:     General:        Right eye: No discharge.        Left eye: No discharge.     Conjunctiva/sclera: Conjunctivae normal.  Cardiovascular:     Rate and Rhythm: Normal rate and regular rhythm.  Pulmonary:     Effort: Pulmonary effort is normal.     Breath sounds: Normal breath sounds. No wheezing, rhonchi or rales.  Musculoskeletal:     Comments: Olecranon bursitis noted bilaterally.  Neurological:     Mental Status: He is alert.  Psychiatric:        Mood and Affect: Mood normal.        Behavior: Behavior normal.     Lab Results  Component Value Date   WBC 8.5 12/21/2022   HGB 20.5 (H) 12/21/2022   HCT 56.2 (H) 12/21/2022   PLT 258 12/21/2022   GLUCOSE 115 (H) 01/24/2023   CHOL 160 07/23/2020   TRIG 190 (H) 07/23/2020   HDL 50 07/23/2020   LDLDIRECT 82 07/23/2020   LDLCALC 78 07/23/2020   ALT 42 12/14/2022   AST 36 12/14/2022   NA 140 01/24/2023   K 4.4 01/24/2023   CL 97 01/24/2023   CREATININE 0.92 01/24/2023   BUN 13 01/24/2023   CO2 25 01/24/2023   TSH 1.56 12/01/2022   INR 1.4 (H) 02/16/2020   HGBA1C 4.7 (L) 09/10/2022     Assessment & Plan:   Problem List Items Addressed This Visit        Cardiovascular and Mediastinum   Chronic systolic heart failure (HCC)   Currently euvolemic and improved.  Continue current medications.      Essential hypertension   Stable.  Continue medications.      Relevant Orders   CMP14+EGFR   Microalbumin / creatinine urine ratio     Musculoskeletal and Integument   Seronegative rheumatoid arthritis (HCC)   Brief supply of pain  medication given to the patient while awaiting appointment with pain management.  Information was given to the patient regarding how to reach out to pain management.  [place] controlled substance database was reviewed today.      Relevant Medications   oxyCODONE-acetaminophen (PERCOCET/ROXICET) 5-325 MG tablet     Other   Hyperlipidemia   Relevant Orders   Lipid panel   Erythrocytosis   Labs have been ordered as recommended by hematology/oncology.  Discontinuing testosterone.      Relevant Orders   CBC with Differential   Iron, TIBC and Ferritin Panel   Other Visit Diagnoses       Colon cancer screening       Relevant Orders   Ambulatory referral to Gastroenterology       Meds ordered this encounter  Medications   oxyCODONE-acetaminophen (PERCOCET/ROXICET) 5-325 MG tablet    Sig: Take 1 tablet by mouth every 8 (eight) hours as needed for severe pain (pain score 7-10).    Dispense:  15 tablet    Refill:  0    Follow-up:  Return in about 1 month (around 03/10/2023).  Everlene Other DO [facility]

## 2023-02-07 NOTE — Addendum Note (Signed)
Addended by: Tommie Sams on: 02/07/2023 01:04 PM   Modules accepted: Orders

## 2023-02-08 ENCOUNTER — Encounter: Payer: Self-pay | Admitting: Rheumatology

## 2023-02-08 ENCOUNTER — Ambulatory Visit: Payer: BC Managed Care – PPO | Admitting: Pharmacist

## 2023-02-08 ENCOUNTER — Ambulatory Visit: Payer: BC Managed Care – PPO

## 2023-02-08 NOTE — Telephone Encounter (Signed)
Patient rescheduled Enbrel new start to 02/28/23

## 2023-02-09 NOTE — Telephone Encounter (Signed)
Ok to update 

## 2023-02-10 ENCOUNTER — Ambulatory Visit (HOSPITAL_COMMUNITY): Payer: BC Managed Care – PPO

## 2023-02-14 ENCOUNTER — Encounter (HOSPITAL_COMMUNITY): Payer: Self-pay

## 2023-02-14 ENCOUNTER — Inpatient Hospital Stay (HOSPITAL_COMMUNITY): Payer: BC Managed Care – PPO | Admitting: Anesthesiology

## 2023-02-14 ENCOUNTER — Other Ambulatory Visit: Payer: Self-pay

## 2023-02-14 ENCOUNTER — Inpatient Hospital Stay (HOSPITAL_COMMUNITY): Payer: BC Managed Care – PPO

## 2023-02-14 ENCOUNTER — Emergency Department (HOSPITAL_COMMUNITY): Payer: BC Managed Care – PPO

## 2023-02-14 ENCOUNTER — Inpatient Hospital Stay (HOSPITAL_COMMUNITY)
Admission: EM | Admit: 2023-02-14 | Discharge: 2023-02-26 | DRG: 853 | Disposition: A | Discharge status: Home / Self Care | Payer: BC Managed Care – PPO | Attending: Family Medicine | Admitting: Family Medicine

## 2023-02-14 ENCOUNTER — Encounter (HOSPITAL_COMMUNITY)
Admission: EM | Disposition: A | Discharge status: Home / Self Care | Payer: Self-pay | Source: Home / Self Care | Attending: Family Medicine

## 2023-02-14 DIAGNOSIS — R935 Abnormal findings on diagnostic imaging of other abdominal regions, including retroperitoneum: Secondary | ICD-10-CM | POA: Diagnosis not present

## 2023-02-14 DIAGNOSIS — E860 Dehydration: Secondary | ICD-10-CM | POA: Diagnosis not present

## 2023-02-14 DIAGNOSIS — K920 Hematemesis: Secondary | ICD-10-CM | POA: Diagnosis not present

## 2023-02-14 DIAGNOSIS — Z1152 Encounter for screening for COVID-19: Secondary | ICD-10-CM | POA: Diagnosis not present

## 2023-02-14 DIAGNOSIS — I5022 Chronic systolic (congestive) heart failure: Secondary | ICD-10-CM | POA: Diagnosis not present

## 2023-02-14 DIAGNOSIS — D6869 Other thrombophilia: Secondary | ICD-10-CM | POA: Diagnosis present

## 2023-02-14 DIAGNOSIS — Z452 Encounter for adjustment and management of vascular access device: Secondary | ICD-10-CM | POA: Diagnosis not present

## 2023-02-14 DIAGNOSIS — D751 Secondary polycythemia: Secondary | ICD-10-CM | POA: Diagnosis present

## 2023-02-14 DIAGNOSIS — K922 Gastrointestinal hemorrhage, unspecified: Secondary | ICD-10-CM | POA: Diagnosis not present

## 2023-02-14 DIAGNOSIS — R9389 Abnormal findings on diagnostic imaging of other specified body structures: Secondary | ICD-10-CM | POA: Diagnosis not present

## 2023-02-14 DIAGNOSIS — R0602 Shortness of breath: Secondary | ICD-10-CM | POA: Diagnosis not present

## 2023-02-14 DIAGNOSIS — Z7984 Long term (current) use of oral hypoglycemic drugs: Secondary | ICD-10-CM

## 2023-02-14 DIAGNOSIS — R000 Tachycardia, unspecified: Secondary | ICD-10-CM | POA: Diagnosis not present

## 2023-02-14 DIAGNOSIS — I110 Hypertensive heart disease with heart failure: Secondary | ICD-10-CM | POA: Diagnosis present

## 2023-02-14 DIAGNOSIS — E785 Hyperlipidemia, unspecified: Secondary | ICD-10-CM | POA: Diagnosis present

## 2023-02-14 DIAGNOSIS — K567 Ileus, unspecified: Secondary | ICD-10-CM | POA: Diagnosis not present

## 2023-02-14 DIAGNOSIS — F32A Depression, unspecified: Secondary | ICD-10-CM | POA: Diagnosis present

## 2023-02-14 DIAGNOSIS — R198 Other specified symptoms and signs involving the digestive system and abdomen: Principal | ICD-10-CM | POA: Diagnosis present

## 2023-02-14 DIAGNOSIS — K668 Other specified disorders of peritoneum: Secondary | ICD-10-CM | POA: Diagnosis not present

## 2023-02-14 DIAGNOSIS — M1000 Idiopathic gout, unspecified site: Secondary | ICD-10-CM | POA: Diagnosis present

## 2023-02-14 DIAGNOSIS — N281 Cyst of kidney, acquired: Secondary | ICD-10-CM | POA: Diagnosis not present

## 2023-02-14 DIAGNOSIS — A419 Sepsis, unspecified organism: Secondary | ICD-10-CM | POA: Diagnosis not present

## 2023-02-14 DIAGNOSIS — M109 Gout, unspecified: Secondary | ICD-10-CM | POA: Diagnosis present

## 2023-02-14 DIAGNOSIS — I420 Dilated cardiomyopathy: Secondary | ICD-10-CM | POA: Diagnosis present

## 2023-02-14 DIAGNOSIS — M0600 Rheumatoid arthritis without rheumatoid factor, unspecified site: Secondary | ICD-10-CM | POA: Diagnosis present

## 2023-02-14 DIAGNOSIS — Z7952 Long term (current) use of systemic steroids: Secondary | ICD-10-CM

## 2023-02-14 DIAGNOSIS — J9811 Atelectasis: Secondary | ICD-10-CM | POA: Diagnosis not present

## 2023-02-14 DIAGNOSIS — Z8616 Personal history of COVID-19: Secondary | ICD-10-CM

## 2023-02-14 DIAGNOSIS — Z86718 Personal history of other venous thrombosis and embolism: Secondary | ICD-10-CM

## 2023-02-14 DIAGNOSIS — K9189 Other postprocedural complications and disorders of digestive system: Secondary | ICD-10-CM | POA: Diagnosis not present

## 2023-02-14 DIAGNOSIS — D649 Anemia, unspecified: Secondary | ICD-10-CM | POA: Diagnosis not present

## 2023-02-14 DIAGNOSIS — K658 Other peritonitis: Secondary | ICD-10-CM | POA: Diagnosis present

## 2023-02-14 DIAGNOSIS — Z79899 Other long term (current) drug therapy: Secondary | ICD-10-CM | POA: Diagnosis not present

## 2023-02-14 DIAGNOSIS — Z9889 Other specified postprocedural states: Secondary | ICD-10-CM | POA: Diagnosis not present

## 2023-02-14 DIAGNOSIS — Y828 Other medical devices associated with adverse incidents: Secondary | ICD-10-CM | POA: Diagnosis not present

## 2023-02-14 DIAGNOSIS — F17220 Nicotine dependence, chewing tobacco, uncomplicated: Secondary | ICD-10-CM | POA: Diagnosis present

## 2023-02-14 DIAGNOSIS — R1084 Generalized abdominal pain: Secondary | ICD-10-CM | POA: Diagnosis present

## 2023-02-14 DIAGNOSIS — R0989 Other specified symptoms and signs involving the circulatory and respiratory systems: Secondary | ICD-10-CM | POA: Diagnosis not present

## 2023-02-14 DIAGNOSIS — D849 Immunodeficiency, unspecified: Secondary | ICD-10-CM | POA: Diagnosis present

## 2023-02-14 DIAGNOSIS — T85898A Other specified complication of other internal prosthetic devices, implants and grafts, initial encounter: Secondary | ICD-10-CM | POA: Diagnosis not present

## 2023-02-14 DIAGNOSIS — Z603 Acculturation difficulty: Secondary | ICD-10-CM | POA: Diagnosis present

## 2023-02-14 DIAGNOSIS — F411 Generalized anxiety disorder: Secondary | ICD-10-CM | POA: Diagnosis present

## 2023-02-14 DIAGNOSIS — Z86711 Personal history of pulmonary embolism: Secondary | ICD-10-CM | POA: Diagnosis present

## 2023-02-14 DIAGNOSIS — F419 Anxiety disorder, unspecified: Secondary | ICD-10-CM | POA: Diagnosis not present

## 2023-02-14 DIAGNOSIS — I5042 Chronic combined systolic (congestive) and diastolic (congestive) heart failure: Secondary | ICD-10-CM | POA: Diagnosis present

## 2023-02-14 DIAGNOSIS — J45909 Unspecified asthma, uncomplicated: Secondary | ICD-10-CM | POA: Diagnosis not present

## 2023-02-14 DIAGNOSIS — K631 Perforation of intestine (nontraumatic): Secondary | ICD-10-CM | POA: Diagnosis not present

## 2023-02-14 DIAGNOSIS — Z4682 Encounter for fitting and adjustment of non-vascular catheter: Secondary | ICD-10-CM | POA: Diagnosis not present

## 2023-02-14 DIAGNOSIS — R109 Unspecified abdominal pain: Secondary | ICD-10-CM | POA: Diagnosis not present

## 2023-02-14 DIAGNOSIS — Z881 Allergy status to other antibiotic agents status: Secondary | ICD-10-CM

## 2023-02-14 DIAGNOSIS — Z7901 Long term (current) use of anticoagulants: Secondary | ICD-10-CM

## 2023-02-14 DIAGNOSIS — Z8249 Family history of ischemic heart disease and other diseases of the circulatory system: Secondary | ICD-10-CM

## 2023-02-14 DIAGNOSIS — I878 Other specified disorders of veins: Secondary | ICD-10-CM | POA: Diagnosis not present

## 2023-02-14 DIAGNOSIS — R918 Other nonspecific abnormal finding of lung field: Secondary | ICD-10-CM | POA: Diagnosis not present

## 2023-02-14 DIAGNOSIS — R6520 Severe sepsis without septic shock: Secondary | ICD-10-CM | POA: Diagnosis not present

## 2023-02-14 DIAGNOSIS — Z5986 Financial insecurity: Secondary | ICD-10-CM

## 2023-02-14 DIAGNOSIS — R0902 Hypoxemia: Secondary | ICD-10-CM | POA: Diagnosis not present

## 2023-02-14 DIAGNOSIS — R140 Abdominal distension (gaseous): Secondary | ICD-10-CM | POA: Diagnosis not present

## 2023-02-14 DIAGNOSIS — Z83719 Family history of colon polyps, unspecified: Secondary | ICD-10-CM

## 2023-02-14 DIAGNOSIS — F1020 Alcohol dependence, uncomplicated: Secondary | ICD-10-CM | POA: Diagnosis present

## 2023-02-14 DIAGNOSIS — R188 Other ascites: Secondary | ICD-10-CM | POA: Diagnosis not present

## 2023-02-14 DIAGNOSIS — E291 Testicular hypofunction: Secondary | ICD-10-CM | POA: Diagnosis present

## 2023-02-14 DIAGNOSIS — D72829 Elevated white blood cell count, unspecified: Secondary | ICD-10-CM | POA: Diagnosis not present

## 2023-02-14 DIAGNOSIS — Z79890 Hormone replacement therapy (postmenopausal): Secondary | ICD-10-CM

## 2023-02-14 DIAGNOSIS — K91871 Postprocedural hematoma of a digestive system organ or structure following other procedure: Secondary | ICD-10-CM | POA: Diagnosis not present

## 2023-02-14 DIAGNOSIS — K6389 Other specified diseases of intestine: Secondary | ICD-10-CM | POA: Diagnosis not present

## 2023-02-14 HISTORY — DX: Anxiety disorder, unspecified: F41.9

## 2023-02-14 HISTORY — DX: Secondary polycythemia: D75.1

## 2023-02-14 HISTORY — DX: Heart failure, unspecified: I50.9

## 2023-02-14 HISTORY — PX: LAPAROTOMY: SHX154

## 2023-02-14 HISTORY — DX: Essential (primary) hypertension: I10

## 2023-02-14 LAB — URINALYSIS, ROUTINE W REFLEX MICROSCOPIC
Bacteria, UA: NONE SEEN
Bilirubin Urine: NEGATIVE
Glucose, UA: >=500 mg/dL — AB
Hgb urine dipstick: NEGATIVE
Ketones, ur: NEGATIVE mg/dL
Leukocytes,Ua: NEGATIVE
Nitrite: NEGATIVE
Protein, ur: NEGATIVE mg/dL
Specific Gravity, Urine: 1.027 (ref 1.005–1.030)
pH: 5 (ref 5.0–8.0)

## 2023-02-14 LAB — COMPREHENSIVE METABOLIC PANEL
ALT: 31 U/L (ref 0–44)
AST: 26 U/L (ref 15–41)
Albumin: 3.9 g/dL (ref 3.5–5.0)
Alkaline Phosphatase: 41 U/L (ref 38–126)
Anion gap: 14 (ref 5–15)
BUN: 22 mg/dL — ABNORMAL HIGH (ref 6–20)
CO2: 24 mmol/L (ref 22–32)
Calcium: 8.9 mg/dL (ref 8.9–10.3)
Chloride: 101 mmol/L (ref 98–111)
Creatinine, Ser: 1.04 mg/dL (ref 0.61–1.24)
GFR, Estimated: >60 mL/min (ref >60)
Glucose, Bld: 167 mg/dL — ABNORMAL HIGH (ref 70–99)
Potassium: 3.7 mmol/L (ref 3.5–5.1)
Sodium: 139 mmol/L (ref 135–145)
Total Bilirubin: 1.9 mg/dL — ABNORMAL HIGH (ref <1.2)
Total Protein: 6.7 g/dL (ref 6.5–8.1)

## 2023-02-14 LAB — LIPASE, BLOOD: Lipase: 42 U/L (ref 11–51)

## 2023-02-14 LAB — CBC
HCT: 52 % (ref 39.0–52.0)
Hemoglobin: 18.5 g/dL — ABNORMAL HIGH (ref 13.0–17.0)
MCH: 34 pg (ref 26.0–34.0)
MCHC: 35.6 g/dL (ref 30.0–36.0)
MCV: 95.6 fL (ref 80.0–100.0)
Platelets: 265 10*3/uL (ref 150–400)
RBC: 5.44 MIL/uL (ref 4.22–5.81)
RDW: 13.2 % (ref 11.5–15.5)
WBC: 13 10*3/uL — ABNORMAL HIGH (ref 4.0–10.5)
nRBC: 0 % (ref 0.0–0.2)

## 2023-02-14 LAB — RESP PANEL BY RT-PCR (RSV, FLU A&B, COVID)  RVPGX2
Influenza A by PCR: NEGATIVE
Influenza B by PCR: NEGATIVE
Resp Syncytial Virus by PCR: NEGATIVE
SARS Coronavirus 2 by RT PCR: NEGATIVE

## 2023-02-14 LAB — TYPE AND SCREEN
ABO/RH(D): O POS
Antibody Screen: NEGATIVE

## 2023-02-14 LAB — ABO/RH: ABO/RH(D): O POS

## 2023-02-14 LAB — LACTIC ACID, PLASMA
Lactic Acid, Venous: 2.2 mmol/L (ref 0.5–1.9)
Lactic Acid, Venous: 2.9 mmol/L (ref 0.5–1.9)

## 2023-02-14 LAB — MRSA NEXT GEN BY PCR, NASAL: MRSA by PCR Next Gen: NOT DETECTED

## 2023-02-14 LAB — TROPONIN I (HIGH SENSITIVITY): Troponin I (High Sensitivity): 4 ng/L (ref <18)

## 2023-02-14 SURGERY — LAPAROTOMY, EXPLORATORY
Anesthesia: General | Site: Abdomen

## 2023-02-14 MED ORDER — CHLORHEXIDINE GLUCONATE CLOTH 2 % EX PADS: 6.0000 | MEDICATED_PAD | Freq: Once | CUTANEOUS | Status: DC

## 2023-02-14 MED ORDER — LACTATED RINGERS IV BOLUS
1000.0000 mL | Freq: Once | INTRAVENOUS | Status: AC
  Administered 2023-02-14: 1000 mL via INTRAVENOUS

## 2023-02-14 MED ORDER — THIAMINE MONONITRATE 100 MG PO TABS
100.0000 mg | ORAL_TABLET | Freq: Every day | ORAL | Status: DC
  Administered 2023-02-17 – 2023-02-22 (×5): 100 mg via ORAL
  Filled 2023-02-14 (×10): qty 1

## 2023-02-14 MED ORDER — FENTANYL CITRATE (PF) 100 MCG/2ML IJ SOLN
INTRAMUSCULAR | Status: AC
  Filled 2023-02-14: qty 2

## 2023-02-14 MED ORDER — FOLIC ACID 1 MG PO TABS
1.0000 mg | ORAL_TABLET | Freq: Every day | ORAL | Status: DC
  Filled 2023-02-14 (×2): qty 1

## 2023-02-14 MED ORDER — ONDANSETRON HCL 4 MG/2ML IJ SOLN
INTRAMUSCULAR | Status: DC | PRN
  Administered 2023-02-14: 4 mg via INTRAVENOUS

## 2023-02-14 MED ORDER — SODIUM CHLORIDE 0.9 % IR SOLN
Status: DC | PRN
  Administered 2023-02-14 (×3): 1000 mL

## 2023-02-14 MED ORDER — ROCURONIUM BROMIDE 10 MG/ML (PF) SYRINGE
PREFILLED_SYRINGE | INTRAVENOUS | Status: DC | PRN
  Administered 2023-02-14: 50 mg via INTRAVENOUS
  Administered 2023-02-14: 10 mg via INTRAVENOUS

## 2023-02-14 MED ORDER — OXYCODONE HCL 5 MG PO TABS
5.0000 mg | ORAL_TABLET | ORAL | Status: DC | PRN
  Administered 2023-02-15 – 2023-02-17 (×7): 5 mg via ORAL
  Filled 2023-02-14 (×7): qty 1

## 2023-02-14 MED ORDER — PIPERACILLIN-TAZOBACTAM 3.375 G IVPB: 3.3750 g | Freq: Three times a day (TID) | INTRAVENOUS | Status: DC

## 2023-02-14 MED ORDER — LORAZEPAM 2 MG/ML IJ SOLN
0.5000 mg | Freq: Once | INTRAMUSCULAR | Status: AC
  Administered 2023-02-14: 0.5 mg via INTRAVENOUS
  Filled 2023-02-14: qty 1

## 2023-02-14 MED ORDER — IOHEXOL 300 MG/ML  SOLN
100.0000 mL | Freq: Once | INTRAMUSCULAR | Status: AC | PRN
  Administered 2023-02-14: 100 mL via INTRAVENOUS

## 2023-02-14 MED ORDER — FENTANYL CITRATE (PF) 100 MCG/2ML IJ SOLN
INTRAMUSCULAR | Status: DC | PRN
  Administered 2023-02-14 (×2): 100 ug via INTRAVENOUS

## 2023-02-14 MED ORDER — PANTOPRAZOLE SODIUM 40 MG IV SOLR
40.0000 mg | Freq: Two times a day (BID) | INTRAVENOUS | Status: DC
  Administered 2023-02-14 – 2023-02-26 (×24): 40 mg via INTRAVENOUS
  Filled 2023-02-14 (×24): qty 10

## 2023-02-14 MED ORDER — MIDAZOLAM HCL 2 MG/2ML IJ SOLN
INTRAMUSCULAR | Status: AC
  Filled 2023-02-14: qty 2

## 2023-02-14 MED ORDER — METOPROLOL TARTRATE 5 MG/5ML IV SOLN
5.0000 mg | Freq: Four times a day (QID) | INTRAVENOUS | Status: DC
  Filled 2023-02-14: qty 5

## 2023-02-14 MED ORDER — SUCCINYLCHOLINE CHLORIDE 20 MG/ML IJ SOLN
INTRAMUSCULAR | Status: DC | PRN
  Administered 2023-02-14: 100 mg via INTRAVENOUS

## 2023-02-14 MED ORDER — PROPOFOL 10 MG/ML IV BOLUS
INTRAVENOUS | Status: DC | PRN
  Administered 2023-02-14: 100 mg via INTRAVENOUS

## 2023-02-14 MED ORDER — PROTHROMBIN COMPLEX CONC HUMAN 500 UNITS IV KIT
1551.0000 [IU] | PACK | Status: AC
  Administered 2023-02-14: 1551 [IU] via INTRAVENOUS
  Filled 2023-02-14: qty 1006

## 2023-02-14 MED ORDER — ACETAMINOPHEN 650 MG RE SUPP
650.0000 mg | Freq: Four times a day (QID) | RECTAL | Status: DC | PRN
Start: 2023-02-14 — End: 2023-02-15

## 2023-02-14 MED ORDER — PIPERACILLIN-TAZOBACTAM 3.375 G IVPB
3.3750 g | Freq: Three times a day (TID) | INTRAVENOUS | Status: AC
  Administered 2023-02-15 – 2023-02-19 (×15): 3.375 g via INTRAVENOUS
  Filled 2023-02-14 (×15): qty 50

## 2023-02-14 MED ORDER — MIDAZOLAM HCL 5 MG/5ML IJ SOLN
INTRAMUSCULAR | Status: DC | PRN
  Administered 2023-02-14: 2 mg via INTRAVENOUS

## 2023-02-14 MED ORDER — FENTANYL CITRATE PF 50 MCG/ML IJ SOSY
25.0000 ug | PREFILLED_SYRINGE | INTRAMUSCULAR | Status: DC | PRN
Start: 2023-02-14 — End: 2023-02-14
  Administered 2023-02-14: 50 ug via INTRAVENOUS
  Filled 2023-02-14: qty 1

## 2023-02-14 MED ORDER — SODIUM CHLORIDE 0.9 % IV SOLN
2.0000 g | Freq: Once | INTRAVENOUS | Status: AC
  Administered 2023-02-14: 2 g via INTRAVENOUS
  Filled 2023-02-14: qty 20

## 2023-02-14 MED ORDER — ONDANSETRON HCL 4 MG PO TABS
4.0000 mg | ORAL_TABLET | Freq: Four times a day (QID) | ORAL | Status: DC | PRN
  Administered 2023-02-17 – 2023-02-22 (×3): 4 mg via ORAL
  Filled 2023-02-14 (×3): qty 1

## 2023-02-14 MED ORDER — LACTATED RINGERS IV SOLN: INTRAVENOUS | Status: DC | PRN

## 2023-02-14 MED ORDER — ACETAMINOPHEN 325 MG PO TABS
650.0000 mg | ORAL_TABLET | Freq: Four times a day (QID) | ORAL | Status: DC | PRN
Start: 2023-02-14 — End: 2023-02-15

## 2023-02-14 MED ORDER — PROPOFOL 10 MG/ML IV BOLUS
INTRAVENOUS | Status: AC
  Filled 2023-02-14: qty 20

## 2023-02-14 MED ORDER — PROCHLORPERAZINE EDISYLATE 10 MG/2ML IJ SOLN
10.0000 mg | INTRAMUSCULAR | Status: DC | PRN
  Administered 2023-02-22 – 2023-02-24 (×5): 10 mg via INTRAVENOUS
  Filled 2023-02-14 (×6): qty 2

## 2023-02-14 MED ORDER — ONDANSETRON HCL 4 MG/2ML IJ SOLN
4.0000 mg | Freq: Once | INTRAMUSCULAR | Status: AC
  Administered 2023-02-14: 4 mg via INTRAVENOUS
  Filled 2023-02-14: qty 2

## 2023-02-14 MED ORDER — SODIUM CHLORIDE 0.9 % IV SOLN
2.0000 g | INTRAVENOUS | Status: AC
  Administered 2023-02-14: 2 g via INTRAVENOUS
  Filled 2023-02-14: qty 2

## 2023-02-14 MED ORDER — LORAZEPAM 1 MG PO TABS
1.0000 mg | ORAL_TABLET | ORAL | Status: AC | PRN
Start: 2023-02-14 — End: 2023-02-17
  Administered 2023-02-16: 1 mg via ORAL
  Filled 2023-02-14: qty 1

## 2023-02-14 MED ORDER — SODIUM CHLORIDE 0.9 % IV BOLUS
1000.0000 mL | Freq: Once | INTRAVENOUS | Status: AC
  Administered 2023-02-14: 1000 mL via INTRAVENOUS

## 2023-02-14 MED ORDER — ONDANSETRON HCL 4 MG/2ML IJ SOLN
INTRAMUSCULAR | Status: AC
  Filled 2023-02-14: qty 2

## 2023-02-14 MED ORDER — PIPERACILLIN-TAZOBACTAM 3.375 G IVPB
3.3750 g | INTRAVENOUS | Status: AC
  Administered 2023-02-14: 3.375 g via INTRAVENOUS
  Filled 2023-02-14: qty 50

## 2023-02-14 MED ORDER — BUPIVACAINE HCL (PF) 0.5 % IJ SOLN
INTRAMUSCULAR | Status: AC
  Filled 2023-02-14: qty 30

## 2023-02-14 MED ORDER — LORAZEPAM 2 MG/ML IJ SOLN
1.0000 mg | INTRAMUSCULAR | Status: AC | PRN
  Administered 2023-02-14 – 2023-02-15 (×4): 1 mg via INTRAVENOUS
  Administered 2023-02-15: 2 mg via INTRAVENOUS
  Filled 2023-02-14 (×5): qty 1

## 2023-02-14 MED ORDER — HYDROMORPHONE HCL 1 MG/ML IJ SOLN
0.5000 mg | INTRAMUSCULAR | Status: DC | PRN
  Administered 2023-02-14 – 2023-02-16 (×16): 0.5 mg via INTRAVENOUS
  Filled 2023-02-14 (×16): qty 0.5

## 2023-02-14 MED ORDER — OXYCODONE HCL 5 MG PO TABS: 5.0000 mg | ORAL_TABLET | Freq: Once | ORAL | Status: DC | PRN

## 2023-02-14 MED ORDER — OXYCODONE HCL 5 MG/5ML PO SOLN: 5.0000 mg | Freq: Once | ORAL | Status: DC | PRN

## 2023-02-14 MED ORDER — ROCURONIUM BROMIDE 10 MG/ML (PF) SYRINGE
PREFILLED_SYRINGE | INTRAVENOUS | Status: AC
  Filled 2023-02-14: qty 10

## 2023-02-14 MED ORDER — BUPIVACAINE HCL (PF) 0.5 % IJ SOLN
INTRAMUSCULAR | Status: DC | PRN
  Administered 2023-02-14: 30 mL

## 2023-02-14 MED ORDER — CHLORHEXIDINE GLUCONATE CLOTH 2 % EX PADS
6.0000 | MEDICATED_PAD | Freq: Every day | CUTANEOUS | Status: DC
  Administered 2023-02-16 – 2023-02-22 (×6): 6 via TOPICAL

## 2023-02-14 MED ORDER — ADULT MULTIVITAMIN W/MINERALS CH
1.0000 | ORAL_TABLET | Freq: Every day | ORAL | Status: DC
  Administered 2023-02-16 – 2023-02-22 (×7): 1 via ORAL
  Filled 2023-02-14 (×9): qty 1

## 2023-02-14 MED ORDER — HYDROMORPHONE HCL 1 MG/ML IJ SOLN
1.0000 mg | Freq: Once | INTRAMUSCULAR | Status: AC
  Administered 2023-02-14: 1 mg via INTRAVENOUS
  Filled 2023-02-14: qty 1

## 2023-02-14 MED ORDER — LORAZEPAM 2 MG/ML IJ SOLN: 0.5000 mg | INTRAMUSCULAR | Status: DC | PRN

## 2023-02-14 MED ORDER — ONDANSETRON HCL 4 MG/2ML IJ SOLN
4.0000 mg | Freq: Four times a day (QID) | INTRAMUSCULAR | Status: DC | PRN
  Administered 2023-02-16 – 2023-02-24 (×9): 4 mg via INTRAVENOUS
  Filled 2023-02-14 (×10): qty 2

## 2023-02-14 MED ORDER — ONDANSETRON HCL 4 MG/2ML IJ SOLN: 4.0000 mg | Freq: Once | INTRAMUSCULAR | Status: DC | PRN

## 2023-02-14 MED ORDER — SODIUM CHLORIDE 0.9 % IV SOLN
INTRAVENOUS | Status: AC
  Filled 2023-02-14: qty 2

## 2023-02-14 MED ORDER — TRAZODONE HCL 50 MG PO TABS
25.0000 mg | ORAL_TABLET | Freq: Every evening | ORAL | Status: DC | PRN
  Administered 2023-02-16: 25 mg via ORAL
  Filled 2023-02-14: qty 1

## 2023-02-14 MED ORDER — NICOTINE 14 MG/24HR TD PT24
14.0000 mg | MEDICATED_PATCH | Freq: Every day | TRANSDERMAL | Status: DC
  Administered 2023-02-15 – 2023-02-19 (×3): 14 mg via TRANSDERMAL
  Filled 2023-02-14 (×6): qty 1

## 2023-02-14 MED ORDER — LACTATED RINGERS IV BOLUS
500.0000 mL | Freq: Once | INTRAVENOUS | Status: AC
  Administered 2023-02-14: 500 mL via INTRAVENOUS

## 2023-02-14 MED ORDER — PIPERACILLIN-TAZOBACTAM 3.375 G IVPB 30 MIN
3.3750 g | Freq: Once | INTRAVENOUS | Status: DC
Start: 2023-02-14 — End: 2023-02-14

## 2023-02-14 MED ORDER — THIAMINE HCL 100 MG/ML IJ SOLN
100.0000 mg | Freq: Every day | INTRAMUSCULAR | Status: DC
  Administered 2023-02-14 – 2023-02-26 (×8): 100 mg via INTRAVENOUS
  Filled 2023-02-14: qty 2
  Filled 2023-02-14: qty 1
  Filled 2023-02-14 (×7): qty 2
  Filled 2023-02-14: qty 1
  Filled 2023-02-14: qty 2
  Filled 2023-02-14: qty 1
  Filled 2023-02-14: qty 2

## 2023-02-14 MED ORDER — LACTATED RINGERS IV SOLN: INTRAVENOUS | Status: DC

## 2023-02-14 MED ORDER — SUGAMMADEX SODIUM 200 MG/2ML IV SOLN
INTRAVENOUS | Status: DC | PRN
  Administered 2023-02-14: 200 mg via INTRAVENOUS

## 2023-02-14 MED ORDER — SUCCINYLCHOLINE CHLORIDE 200 MG/10ML IV SOSY
PREFILLED_SYRINGE | INTRAVENOUS | Status: AC
  Filled 2023-02-14: qty 10

## 2023-02-14 SURGICAL SUPPLY — 40 items
CHLORAPREP W/TINT 26 (MISCELLANEOUS) ×1 IMPLANT
CLOTH BEACON ORANGE TIMEOUT ST (SAFETY) ×1 IMPLANT
COVER LIGHT HANDLE STERIS (MISCELLANEOUS) ×2 IMPLANT
DRAPE WARM FLUID 44X44 (DRAPES) ×1 IMPLANT
DRSG OPSITE POSTOP 4X10 (GAUZE/BANDAGES/DRESSINGS) IMPLANT
ELECT BLADE 6 FLAT ULTRCLN (ELECTRODE) IMPLANT
ELECT REM PT RETURN 9FT ADLT (ELECTROSURGICAL) ×1
ELECTRODE REM PT RTRN 9FT ADLT (ELECTROSURGICAL) ×1 IMPLANT
EVACUATOR DRAINAGE 10X20 100CC (DRAIN) IMPLANT
GLOVE BIO SURGEON STRL SZ 6.5 (GLOVE) ×1 IMPLANT
GLOVE BIOGEL PI IND STRL 6.5 (GLOVE) ×1 IMPLANT
GLOVE BIOGEL PI IND STRL 7.0 (GLOVE) ×2 IMPLANT
GOWN STRL REUS W/TWL LRG LVL3 (GOWN DISPOSABLE) ×3 IMPLANT
HANDLE SUCTION POOLE (INSTRUMENTS) ×1 IMPLANT
INST SET MAJOR GENERAL (KITS) ×1 IMPLANT
KIT TURNOVER KIT A (KITS) ×1 IMPLANT
LIGASURE IMPACT 36 18CM CVD LR (INSTRUMENTS) IMPLANT
MANIFOLD NEPTUNE II (INSTRUMENTS) ×1 IMPLANT
NDL HYPO 21X1.5 SAFETY (NEEDLE) ×1 IMPLANT
NEEDLE HYPO 21X1.5 SAFETY (NEEDLE) ×1 IMPLANT
NS IRRIG 1000ML POUR BTL (IV SOLUTION) ×2 IMPLANT
PACK MAJOR ABDOMINAL (CUSTOM PROCEDURE TRAY) ×1 IMPLANT
PAD ARMBOARD 7.5X6 YLW CONV (MISCELLANEOUS) ×1 IMPLANT
POSITIONER HEAD 8X9X4 ADT (SOFTGOODS) ×1 IMPLANT
POUCH OSTOMY 2 PC DRNBL 2.75 (WOUND CARE) IMPLANT
RETRACTOR WND ALEXIS-O 25 LRG (MISCELLANEOUS) IMPLANT
RETRACTOR WOUND ALXS 34CM XLRG (MISCELLANEOUS) IMPLANT
RTRCTR WOUND ALEXIS 34CM XLRG (MISCELLANEOUS) ×1
RTRCTR WOUND ALEXIS O 25CM LRG (MISCELLANEOUS) ×1
SET BASIN LINEN APH (SET/KITS/TRAYS/PACK) ×1 IMPLANT
SPONGE DRAIN TRACH 4X4 STRL 2S (GAUZE/BANDAGES/DRESSINGS) IMPLANT
SPONGE T-LAP 18X18 ~~LOC~~+RFID (SPONGE) ×1 IMPLANT
STAPLER VISISTAT (STAPLE) ×1 IMPLANT
SUCTION POOLE HANDLE (INSTRUMENTS) ×1
SUT ETHILON 3 0 PS 1 (SUTURE) IMPLANT
SUT PDS AB CT VIOLET #0 27IN (SUTURE) ×2 IMPLANT
SUT SILK 3 0 SH CR/8 (SUTURE) ×1 IMPLANT
SWAB CULTURE ESWAB REG 1ML (MISCELLANEOUS) IMPLANT
SYR 30ML LL (SYRINGE) ×2 IMPLANT
TRAY FOLEY MTR SLVR 16FR STAT (SET/KITS/TRAYS/PACK) ×1 IMPLANT

## 2023-02-14 NOTE — Anesthesia Postprocedure Evaluation (Signed)
Anesthesia Post Note  Patient: Nicholai Boerboom  Procedure(s) Performed: EXPLORATORY LAPAROTOMY, wash out, drain placement (Abdomen)  Patient location during evaluation: PACU Anesthesia Type: General Level of consciousness: awake and alert Pain management: pain level controlled Vital Signs Assessment: post-procedure vital signs reviewed and stable Respiratory status: spontaneous breathing, nonlabored ventilation, respiratory function stable and patient connected to nasal cannula oxygen Cardiovascular status: blood pressure returned to baseline and stable Postop Assessment: no apparent nausea or vomiting Anesthetic complications: no   No notable events documented.   Last Vitals:  Vitals:   02/14/23 1515 02/14/23 1530  BP:  115/76  Pulse: (!) 124 (!) 117  Resp: (!) 28 (!) 24  Temp:    SpO2: 93% 95%    Last Pain:  Vitals:   02/14/23 1538  TempSrc:   PainSc: 4                  Windell Norfolk

## 2023-02-14 NOTE — ED Triage Notes (Signed)
Per EMS, Pt, from home, c/o generalized abdominal pain and n/v/d starting this morning.  Pain score 10/10.    Pt reports "they were supposed to look at my gallbladder but havent. "  18g IV L AC

## 2023-02-14 NOTE — ED Notes (Signed)
Pt's O2 dropped in to mid-80s.  Pt placed on 3L [place] and O2 increased to 94%.

## 2023-02-14 NOTE — ED Notes (Signed)
OR staff at bedside.

## 2023-02-14 NOTE — Progress Notes (Signed)
Kiersten from the lab called with a critical lab result; provided phone number to the OR for follow up.  Lab will call the OR now with the result.

## 2023-02-14 NOTE — ED Notes (Signed)
Patient transported to CT 

## 2023-02-14 NOTE — Anesthesia Preprocedure Evaluation (Signed)
Anesthesia Evaluation  Patient identified by MRN, date of birth, ID band Patient awake    Reviewed: Allergy & Precautions, H&P , NPO status , Patient's Chart, lab work & pertinent test results, reviewed documented beta blocker date and time   Airway Mallampati: II  TM Distance: >3 FB Neck ROM: full    Dental no notable dental hx.    Pulmonary neg pulmonary ROS, asthma    Pulmonary exam normal breath sounds clear to auscultation       Cardiovascular Exercise Tolerance: Good hypertension, negative cardio ROS  Rhythm:regular Rate:Normal     Neuro/Psych   Anxiety     negative neurological ROS  negative psych ROS   GI/Hepatic negative GI ROS, Neg liver ROS,,,  Endo/Other  negative endocrine ROS    Renal/GU negative Renal ROS  negative genitourinary   Musculoskeletal   Abdominal   Peds  Hematology negative hematology ROS (+)   Anesthesia Other Findings   Reproductive/Obstetrics negative OB ROS                             Anesthesia Physical Anesthesia Plan  ASA: 4 and emergent  Anesthesia Plan: General and General ETT   Post-op Pain Management:    Induction:   PONV Risk Score and Plan: Ondansetron  Airway Management Planned:   Additional Equipment:   Intra-op Plan:   Post-operative Plan:   Informed Consent: I have reviewed the patients History and Physical, chart, labs and discussed the procedure including the risks, benefits and alternatives for the proposed anesthesia with the patient or authorized representative who has indicated his/her understanding and acceptance.     Dental Advisory Given  Plan Discussed with: CRNA  Anesthesia Plan Comments:        Anesthesia Quick Evaluation

## 2023-02-14 NOTE — Anesthesia Procedure Notes (Signed)
Procedure Name: Intubation Date/Time: 02/14/2023 3:59 PM  Performed by: Windell Norfolk, MDPre-anesthesia Checklist: Patient identified, Emergency Drugs available, Suction available, Patient being monitored and Timeout performed Patient Re-evaluated:Patient Re-evaluated prior to induction Oxygen Delivery Method: Circle system utilized Preoxygenation: Pre-oxygenation with 100% oxygen Induction Type: IV induction Laryngoscope Size: Glidescope, 3 and 4 Grade View: Grade I Tube type: Oral Tube size: 7.0 mm Number of attempts: 1 Airway Equipment and Method: Video-laryngoscopy and Stylet Placement Confirmation: ETT inserted through vocal cords under direct vision, positive ETCO2 and breath sounds checked- equal and bilateral Secured at: 22 cm Tube secured with: Tape Dental Injury: Teeth and Oropharynx as per pre-operative assessment

## 2023-02-14 NOTE — Progress Notes (Addendum)
[facility]  Patient's wife is updated. Ex lap for pneumoperitoneum. No source found for the perforation. JP placed in the epigastric region and pelvis.  Discussed with wife the literature says that 2% of the time there is no site of perforation identifiable.   BP improved with fluids intraoperatively. Hopefully more fluid resuscitation will help with HR and BP further.   Patient has a history of EtOH use. He has drank as much as a 24 pack in the past but probably drinks about 6 beers a day. He also drinks some liquor at times. Will plan have Dr. Laural Benes to do CIWA.   PRN narcotics CIWA Npo, NG CXR to verify NG, also confirmed intraoperative  JP drain in place Given no identification of the perforation will plan for CT with oral contrast once having bowel function Expect some ileus Labs in AM Foley tonight LR @ 150 SCDs, hold on restarting blood thinner until AM, oozing from tissue  Zosyn post op for 5 days, cultures obtained   Algis Greenhouse, MD [facility] [address] [place], [place] [address] [phone] (office)

## 2023-02-14 NOTE — H&P (Addendum)
History and Physical  [facility]  Ryan Gibson GLO:[id number] DOB: 1975/05/17 DOA: 02/14/2023  PCP: Tommie Sams, DO  Patient coming from: Home by [facility]  Level of care: Stepdown  I have personally briefly reviewed patient's old medical records in [facility]  Chief Complaint: severe abdominal pain   HPI: Ryan Gibson is a 47 year old male with history of PE on apixaban (last dose taken 12/23 at 6pm), seronegative rheumatoid arthritis, hyperlipidemia, dilated cardiomyopathy on GDMT, chronic systolic heart failure with EF improved to 50-55% on most recent echo done 01/26/23, gout, hypogonadism on testosterone, 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  He presented from home by EMS complaining of severe generalized abdominal pain with nausea vomiting and diarrhea that started early this morning.  The pain is 10/10.  He feels very ill and having palpitations.  He denies fever and chills.  In the ED he was noted to have a rigid abdomen and sent for CT abdomen pelvis with findings of concern for a perforated abdominal viscus.  Emergent surgical consultation requested.  He is started on IV antibiotics and IV fluids and admission requested for further management.   Past Medical History:  Diagnosis Date   Asthma    AS A CHILD ONLY   DVT (deep venous thrombosis) (HCC)    Foot pain, right    painful x 1 month and swelling   Idiopathic gout     Past Surgical History:  Procedure Laterality Date   HERNIA REPAIR  07/13/2011   [facility]   INGUINAL HERNIA REPAIR  07/13/2011   Procedure: HERNIA REPAIR INGUINAL ADULT;  Surgeon: Wilmon Arms. Corliss Skains, MD;  Location: [facility]S;  Service: General;  Laterality: Right;     reports that he has never smoked. He has never been exposed to tobacco smoke. His smokeless tobacco use includes  chew. He reports current alcohol use. He reports that he does not use drugs.  Allergies  Allergen Reactions   Vancomycin Hives    hives post op on R arm -site of infusion, and whole body itching    Family History  Problem Relation Age of Onset   Healthy Mother    Heart disease Father     Prior to Admission medications   Medication Sig Start Date End Date Taking? Authorizing Provider  Abatacept (ORENCIA IV) Inject 1,000 mg into the vein every 28 (twenty-eight) days.    Pollyann Savoy, MD  colchicine 0.6 MG tablet TAKE 1 TABLET BY MOUTH EVERY DAY 01/23/23   Gearldine Bienenstock, PA-C  DULoxetine (CYMBALTA) 60 MG capsule Take 1 capsule (60 mg total) by mouth daily. 11/08/22   Cook, Jayce G, DO  ELIQUIS 5 MG TABS tablet Take 1 tablet (5 mg total) by mouth 2 (two) times daily. 11/23/22   Sharlene Dory, PA-C  empagliflozin (JARDIANCE) 10 MG TABS tablet Take 1 tablet (10 mg total) by mouth daily before breakfast. 11/23/22   Sharlene Dory, PA-C  Febuxostat 80 MG TABS TAKE 1 TABLET BY MOUTH EVERY DAY 01/23/23   Gearldine Bienenstock, PA-C  furosemide (LASIX) 40 MG tablet Take 80 mg by mouth daily.    [provider]  metoprolol succinate (TOPROL-XL) 100 MG 24 hr tablet Take 1.5 tablets (150 mg total) by mouth daily. Take with or immediately following a meal. 11/23/22 07/21/23  Sharlene Dory, PA-C  oxyCODONE-acetaminophen (PERCOCET/ROXICET) 5-325 MG tablet Take 1 tablet by mouth every 8 (eight) hours as needed for severe pain (pain score 7-10). 02/07/23   Tommie Sams, DO  predniSONE (DELTASONE) 5 MG tablet Take 4 tabs po qd x 14 days, 3  tabs po qd x 14 days, 2  tabs po qd x 14 days, 1  tab po qd x 14 days 01/31/23   Gearldine Bienenstock, PA-C  sacubitril-valsartan (ENTRESTO) 49-51 MG Take 1 tablet by mouth 2 (two) times daily. 11/23/22   Sharlene Dory, PA-C  spironolactone (ALDACTONE) 25 MG tablet Take 1 tablet (25 mg total) by mouth daily. 11/23/22 11/18/23  Sharlene Dory, PA-C    Physical Exam: Vitals:    02/14/23 1330 02/14/23 1359 02/14/23 1400 02/14/23 1450  BP: 131/86  133/86 106/75  Pulse: (!) 141 (!) 134 (!) 135 (!) 133  Resp: (!) 28 (!) 26 (!) 27 13  Temp:      TempSrc:      SpO2: 91% 93% 90% 90%  Weight:      Height:        Constitutional: pt awake and alert but appears ill and uncomfortable and moderately distressed Eyes: PERRL, lids and conjunctivae normal ENMT: Mucous membranes are very dry. Posterior pharynx clear of any exudate or lesions.Normal dentition.  Neck: normal, supple, no masses, no thyromegaly Respiratory: clear to auscultation bilaterally, no wheezing, no crackles. Normal respiratory effort. No accessory muscle use.  Cardiovascular: tachycardic rate; normal s1, s2 sounds, no murmurs / rubs / gallops. No extremity edema. 2+ pedal pulses. No carotid bruits.  Abdomen: severely distended and diffuse tenderness, guarding present no masses palpated. No hepatosplenomegaly. Bowel sounds positive.  Musculoskeletal: no clubbing / cyanosis. No joint deformity upper and lower extremities. Good ROM, no contractures. Normal muscle tone.  Skin: no rashes, lesions, ulcers. No induration Neurologic: CN 2-12 grossly intact. Sensation intact, DTR normal. Strength 5/5 in all 4.  Psychiatric: Normal judgment and insight. Alert and oriented x 3. Normal mood.   Labs on Admission: I have personally reviewed following labs and imaging studies  CBC: Recent Labs  Lab 02/14/23 1206  WBC 13.0*  HGB 18.5*  HCT 52.0  MCV 95.6  PLT 265   Basic Metabolic Panel: Recent Labs  Lab 02/14/23 1206  NA 139  K 3.7  CL 101  CO2 24  GLUCOSE 167*  BUN 22*  CREATININE 1.04  CALCIUM 8.9   GFR: Estimated Creatinine Clearance: 115.8 mL/min (by C-G formula based on SCr of 1.04 mg/dL). Liver Function Tests: Recent Labs  Lab 02/14/23 1206  AST 26  ALT 31  ALKPHOS 41  BILITOT 1.9*  PROT 6.7  ALBUMIN 3.9   Recent Labs  Lab 02/14/23 1206  LIPASE 42   No results for input(s):  "AMMONIA" in the last 168 hours. Coagulation Profile: No results for input(s): "INR", "PROTIME" in the last 168 hours. Cardiac Enzymes: No results for input(s): "CKTOTAL", "CKMB", "CKMBINDEX", "TROPONINI" in the last 168 hours. BNP (last 3 results) Recent Labs    01/24/23 1118  PROBNP 243*   HbA1C: No results for input(s): "HGBA1C" in the last 72 hours. CBG: No results for input(s): "GLUCAP" in the last 168 hours. Lipid Profile: No results for input(s): "CHOL", "HDL", "LDLCALC", "TRIG", "CHOLHDL", "LDLDIRECT" in the last 72 hours. Thyroid Function Tests: No results for input(s): "TSH", "T4TOTAL", "FREET4", "T3FREE", "THYROIDAB" in the last 72 hours. Anemia Panel: No results for input(s): "VITAMINB12", "FOLATE", "FERRITIN", "TIBC", "IRON", "RETICCTPCT" in the last  72 hours. Urine analysis:    Component Value Date/Time   COLORURINE YELLOW 02/14/2023 1246   APPEARANCEUR CLEAR 02/14/2023 1246   LABSPEC 1.027 02/14/2023 1246   PHURINE 5.0 02/14/2023 1246   GLUCOSEU >=500 (A) 02/14/2023 1246   HGBUR NEGATIVE 02/14/2023 1246   BILIRUBINUR NEGATIVE 02/14/2023 1246   KETONESUR NEGATIVE 02/14/2023 1246   PROTEINUR NEGATIVE 02/14/2023 1246   NITRITE NEGATIVE 02/14/2023 1246   LEUKOCYTESUR NEGATIVE 02/14/2023 1246    Radiological Exams on Admission: DG Chest Port 1 View Result Date: 02/14/2023 CLINICAL DATA:  Shortness of breath. EXAM: PORTABLE CHEST 1 VIEW COMPARISON:  02/16/2020. FINDINGS: Low lung volume. Mild pulmonary vascular congestion, which may be accentuated by low lung volume. No frank pulmonary edema. Bilateral lung fields are otherwise clear. No acute consolidation or lung collapse. Bilateral costophrenic angles are clear. Normal cardio-mediastinal silhouette. No acute osseous abnormalities. The soft tissues are within normal limits. IMPRESSION: *Mild diffuse pulmonary vascular congestion. Otherwise, no acute cardiopulmonary abnormality. Electronically Signed   By: Jules Schick M.D.   On: 02/14/2023 13:26   CT ABDOMEN PELVIS W CONTRAST Result Date: 02/14/2023 CLINICAL DATA:  Acute generalized abdominal pain. EXAM: CT ABDOMEN AND PELVIS WITH CONTRAST TECHNIQUE: Multidetector CT imaging of the abdomen and pelvis was performed using the standard protocol following bolus administration of intravenous contrast. RADIATION DOSE REDUCTION: This exam was performed according to the departmental dose-optimization program which includes automated exposure control, adjustment of the mA and/or kV according to patient size and/or use of iterative reconstruction technique. CONTRAST:  OMNIPAQUE IOHEXOL 300 MG/ML  SOLN COMPARISON:  February 11, 2020. FINDINGS: Lower chest: No acute abnormality. Hepatobiliary: No focal liver abnormality is seen. No gallstones, gallbladder wall thickening, or biliary dilatation. Pancreas: Unremarkable. No pancreatic ductal dilatation or surrounding inflammatory changes. Spleen: Normal in size without focal abnormality. Adrenals/Urinary Tract: Adrenal glands appear normal. Stable left renal cyst is noted for which no further follow-up is required. No hydronephrosis or renal obstruction is noted. Urinary bladder is unremarkable. Stomach/Bowel: The stomach is unremarkable. The appendix appears normal. Mild amount of free air is noted in the epigastric region and in the pelvis consistent with rupture of hollow viscus. Mildly dilated and thick walled small bowel loops are noted in the pelvis and lower abdomen suggesting enteritis. There also appears to be mild sigmoid wall thickening suggesting possible diverticulitis. There is noted air between a small bowel loop and sigmoid colon, and therefore the source of perforation is not clearly identifiable. Vascular/Lymphatic: No significant vascular findings are present. No enlarged abdominal or pelvic lymph nodes. Reproductive: Prostate is unremarkable. Other: No ascites or hernia is noted. Musculoskeletal: No acute or  significant osseous findings. IMPRESSION: Pneumoperitoneum is noted both in the epigastric and pelvic regions concerning for rupture of hollow viscus. Mildly dilated and inflamed small bowel loops are noted in lower abdomen and pelvis as well as mild sigmoid wall thickening suggesting possible inflammation there is well. It is uncertain if the pneumoperitoneum is secondary to ruptured small or large bowel. Critical Value/emergent results were called by telephone at the time of interpretation on 02/14/2023 at 1:22 pm to provider JOSEPH ZAMMIT , who verbally acknowledged these results. Electronically Signed   By: Lupita Raider M.D.   On: 02/14/2023 13:24   EKG: Independently reviewed. Sinus tachycardia   Assessment/Plan Principal Problem:   Perforation of viscus Active Problems:   Severe sepsis (HCC)   Gout   Hypogonadism male   Dilated cardiomyopathy (HCC)   Hyperlipidemia  History of pulmonary embolism   Chronic systolic heart failure (HCC)   Anxiety   Seronegative rheumatoid arthritis (HCC)   High risk medication use   Erythrocytosis   Sinus tachycardia   Acquired thrombophilia (HCC)   Severe dehydration   Generalized abdominal pain   Leukocytosis   Hyperbilirubinemia   Severe sepsis from intraabdominal infection  - pt emergently going to the OR for treatment - He has been loaded with IV ceftriaxone in ED - zosyn per pharmacy dosing requested - cefotetan on call to OR - IV fluid bolus ordered  - lactic acid still pending - monitor for signs of shock   Perforated abdominal viscous with pneumoperitoneum - Dr. Henreitta Leber taking him to OR emergently after Kcentra treatment - anticipate will stay in ICU postop for monitoring   History of PE/ Acquired thrombophilia - last dose of apixaban was taken 12/23 at 6pm - K-centra being given preop  - further anticoagulation pending surgeon recommendations  Leukocytosis - secondary to abdominal infection  - ordered blood cultures x 2  and lactic acid - unfortunately IV ceftriaxone had already been given prior to blood cultures - follow CBC with differential   Severe dehydration - IV fluid bolus and maintenance fluids ordered  Hyperbilirubinemia - likely reactive  - recheck in AM   GAD - severe anxiety at this time - IV lorazepam ordered  Generalized abdominal pain  - severe pain - IV hydromorphone ordered as needed   Rheumatoid arthritis  - he is immunocompromised - his rheumatologist   Tobacco user - nicotine patch ordered   Dilated cardiomyopathy Chronic HFimpEF - He is on GDMT which is currently on hold - Fortunately his recent TTE shows EF improved to 50-55%  - monitor strict intake output and weight  - he appears dehydrated at this time - careful with fluids and monitoring what is being given   Critical Care Procedure Note Authorized and Performed by: Maryln Manuel MD  Total Critical Care time:  65 mins  Due to a high probability of clinically significant, life threatening deterioration, the patient required my highest level of preparedness to intervene emergently and I personally spent this critical care time directly and personally managing the patient.  This critical care time included obtaining a history; examining the patient, pulse oximetry; ordering and review of studies; arranging urgent treatment with development of a management plan; evaluation of patient's response of treatment; frequent reassessment; and discussions with other providers.  This critical care time was performed to assess and manage the high probability of imminent and life threatening deterioration that could result in multi-organ failure.  It was exclusive of separately billable procedures and treating other patients and teaching time.    DVT prophylaxis: SCDs  Code Status: Full   Family Communication: wife/mother at bedside   Disposition Plan: admit to stepdown ICU   Consults called: surgery   Admission status: INP   Level of care: Stepdown Standley Dakins MD [facility] How to contact the [facility] Attending or Consulting provider 7A - 7P or covering provider during after hours 7P -7A, for this patient?  Check the care team in [facility] and look for a) attending/consulting [facility] provider listed and b) the [facility] team listed Log into [email or web address] and use [place]'s universal password to access. If you do not have the password, please contact the hospital operator. Locate the [facility] provider you are looking for under [facility] and page to a number that you can be directly reached. If you still have  difficulty reaching the provider, please page the [facility] (Director on Call) for the Hospitalists listed on amion for assistance.   If 7PM-7AM, please contact night-coverage [email or web address] Password [email or web address]  02/14/2023, 3:01 PM

## 2023-02-14 NOTE — Hospital Course (Addendum)
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  He presented from home by EMS complaining of severe generalized abdominal pain with nausea vomiting and diarrhea that started early this morning.  The pain is 10/10.  He feels very ill and having palpitations.  He denies fever and chills.  In the ED he was noted to have a rigid abdomen and sent for CT abdomen pelvis with findings of concern for a perforated abdominal viscus.  Emergent surgical consultation requested.  He is started on IV antibiotics and IV fluids and admission requested for further management. The patient underwent exploratory laparotomy for his pneumoperitoneum on 02/14/2023.  A JP drain was placed over the stomach and into the pelvis.  General surgery continue to follow-up for postoperative care.  His hospitalization has been prolonged secondary to patient removal of his NG tube and postoperative ileus. NG tube was replaced on 02/17/2023 putting out 1500 cc.

## 2023-02-14 NOTE — Transfer of Care (Signed)
Immediate Anesthesia Transfer of Care Note  Patient: Ryan Gibson  Procedure(s) Performed: EXPLORATORY LAPAROTOMY, wash out, drain placement (Abdomen)  Patient Location: PACU  Anesthesia Type:General  Level of Consciousness: awake and drowsy  Airway & Oxygen Therapy: Patient Spontanous Breathing  Post-op Assessment: Report given to RN and Post -op Vital signs reviewed and stable  Post vital signs: Reviewed and stable  Last Vitals:  Vitals Value Taken Time  BP 114/66   Temp 98.7   Pulse 113 02/14/23 1750  Resp 19 02/14/23 1751  SpO2 96 % 02/14/23 1750  Vitals shown include unfiled device data.  Last Pain:  Vitals:   02/14/23 1538  TempSrc:   PainSc: 4          Complications: No notable events documented.

## 2023-02-14 NOTE — Consult Note (Signed)
[facility] Consult  Reason for Consult: Pneumoperitoneum  Referring Physician: Dr. Estell Harpin   Chief Complaint   Abdominal Pain; Emesis; Diarrhea     HPI: Ryan Gibson is a 47 y.o. male with new onset abdominal pain that started at 10AM. He tried see if it would pass but it kept getting worse. He came to the ED. He has been tachycardic and hypotensive. He is here today wit his wife. He has a history of seronegative rheumatoid arthritis on prednisone, gout, pulmonary embolism on Eliquis and took the last dose last night. He has a history of controlled CHF with EF 50%. He was recently referred for colonoscopy due to a family history of polyps and bleeding per his wife's report. His wife also says he has a high ferritin level.   Workup reveals pneumoperitoneum and leukocytosis. The source of the free air is not known but potentially the sigmoid colon.   Past Medical History:  Diagnosis Date   Asthma    AS A CHILD ONLY   DVT (deep venous thrombosis) (HCC)    Foot pain, right    painful x 1 month and swelling   Idiopathic gout     Past Surgical History:  Procedure Laterality Date   HERNIA REPAIR  07/13/2011   [facility]   INGUINAL HERNIA REPAIR  07/13/2011   Procedure: HERNIA REPAIR INGUINAL ADULT;  Surgeon: Wilmon Arms. Corliss Skains, MD;  Location: [facility];  Service: General;  Laterality: Right;    Family History  Problem Relation Age of Onset   Healthy Mother    Heart disease Father     Social History   Tobacco Use   Smoking status: Never    Passive exposure: Never   Smokeless tobacco: Current    Types: Chew  Vaping Use   Vaping status: Never Used  Substance Use Topics   Alcohol use: Yes    Comment: Social   Drug use: No    Medications: I have reviewed the patient's current medications. Prior to Admission:  Current Facility-Administered Medications  Medication Dose Route Frequency Provider Last Rate Last Admin   acetaminophen (TYLENOL) tablet 650 mg  650  mg Oral Q6H PRN Johnson, Clanford L, MD       Or   acetaminophen (TYLENOL) suppository 650 mg  650 mg Rectal Q6H PRN Laural Benes, Clanford L, MD       [START ON 02/15/2023] cefoTEtan (CEFOTAN) 2 g in sodium chloride 0.9 % 100 mL IVPB  2 g Intravenous On Call to OR Lucretia Roers, MD       Chlorhexidine Gluconate Cloth 2 % PADS 6 each  6 each Topical Daily Johnson, Clanford L, MD       Chlorhexidine Gluconate Cloth 2 % PADS 6 each  6 each Topical Once Lucretia Roers, MD       HYDROmorphone (DILAUDID) injection 0.5 mg  0.5 mg Intravenous Q2H PRN Johnson, Clanford L, MD   0.5 mg at 02/14/23 1501   lactated ringers infusion   Intravenous Continuous Johnson, Clanford L, MD       LORazepam (ATIVAN) injection 0.5 mg  0.5 mg Intravenous Q4H PRN Johnson, Clanford L, MD       ondansetron (ZOFRAN) tablet 4 mg  4 mg Oral Q6H PRN Johnson, Clanford L, MD       Or   ondansetron (ZOFRAN) injection 4 mg  4 mg Intravenous Q6H PRN Johnson, Clanford L, MD       oxyCODONE (Oxy IR/ROXICODONE) immediate release tablet  5 mg  5 mg Oral Q4H PRN Johnson, Clanford L, MD       pantoprazole (PROTONIX) injection 40 mg  40 mg Intravenous Q12H Johnson, Clanford L, MD       piperacillin-tazobactam (ZOSYN) IVPB 3.375 g  3.375 g Intravenous Once Johnson, Clanford L, MD       prochlorperazine (COMPAZINE) injection 10 mg  10 mg Intravenous Q4H PRN Johnson, Clanford L, MD       prothrombin complex conc human (KCENTRA) IVPB 1,551 Units  1,551 Units Intravenous STAT Lucretia Roers, MD 144 mL/hr at 02/14/23 1448 1,551 Units at 02/14/23 1448   traZODone (DESYREL) tablet 25 mg  25 mg Oral QHS PRN Cleora Fleet, MD       Current Outpatient Medications  Medication Sig Dispense Refill Last Dose/Taking   Abatacept (ORENCIA IV) Inject 1,000 mg into the vein every 28 (twenty-eight) days.      colchicine 0.6 MG tablet TAKE 1 TABLET BY MOUTH EVERY DAY 90 tablet 0    DULoxetine (CYMBALTA) 60 MG capsule Take 1 capsule (60 mg total)  by mouth daily. 90 capsule 3    ELIQUIS 5 MG TABS tablet Take 1 tablet (5 mg total) by mouth 2 (two) times daily. 180 tablet 3    empagliflozin (JARDIANCE) 10 MG TABS tablet Take 1 tablet (10 mg total) by mouth daily before breakfast. 90 tablet 3    Febuxostat 80 MG TABS TAKE 1 TABLET BY MOUTH EVERY DAY 90 tablet 0    furosemide (LASIX) 40 MG tablet Take 80 mg by mouth daily.      metoprolol succinate (TOPROL-XL) 100 MG 24 hr tablet Take 1.5 tablets (150 mg total) by mouth daily. Take with or immediately following a meal. 135 tablet 3    oxyCODONE-acetaminophen (PERCOCET/ROXICET) 5-325 MG tablet Take 1 tablet by mouth every 8 (eight) hours as needed for severe pain (pain score 7-10). 15 tablet 0    predniSONE (DELTASONE) 5 MG tablet Take 4 tabs po qd x 14 days, 3  tabs po qd x 14 days, 2  tabs po qd x 14 days, 1  tab po qd x 14 days 140 tablet 0    sacubitril-valsartan (ENTRESTO) 49-51 MG Take 1 tablet by mouth 2 (two) times daily. 180 tablet 3    spironolactone (ALDACTONE) 25 MG tablet Take 1 tablet (25 mg total) by mouth daily. 90 tablet 3     Scheduled:  Chlorhexidine Gluconate Cloth  6 each Topical Daily   Chlorhexidine Gluconate Cloth  6 each Topical Once   pantoprazole (PROTONIX) IV  40 mg Intravenous Q12H   Continuous:  [START ON 02/15/2023] cefoTEtan (CEFOTAN) IV     lactated ringers     piperacillin-tazobactam     prothrombin complex conc human (KCENTRA) IVPB 1,551 Units 1,551 Units (02/14/23 1448)   IHK:[id number] **OR** acetaminophen, HYDROmorphone (DILAUDID) injection, LORazepam, ondansetron **OR** ondansetron (ZOFRAN) IV, oxyCODONE, prochlorperazine, traZODone  Allergies  Allergen Reactions   Vancomycin Hives    hives post op on R arm -site of infusion, and whole body itching     ROS:  A comprehensive review of systems was negative except for: Gastrointestinal: positive for abdominal pain, diarrhea, nausea, and vomiting  Blood pressure 106/75, pulse (!) 133,  temperature 98.2 F (36.8 C), temperature source Oral, resp. rate 13, height 6\' 2"  (1.88 m), weight 109.8 kg, SpO2 90%. Physical Exam Vitals reviewed.  HENT:     Head: Normocephalic.  Cardiovascular:     Rate and Rhythm:  Tachycardia present.  Pulmonary:     Effort: Pulmonary effort is normal.  Abdominal:     General: There is distension.     Tenderness: There is abdominal tenderness. There is rebound.  Musculoskeletal:     Comments: Moves all extremities   Skin:    General: Skin is warm.  Neurological:     General: No focal deficit present.     Mental Status: He is alert.  Psychiatric:        Mood and Affect: Mood is anxious.     Results: Results for orders placed or performed during the hospital encounter of 02/14/23 (from the past 48 hours)  Lipase, blood     Status: None   Collection Time: 02/14/23 12:06 PM  Result Value Ref Range   Lipase 42 11 - 51 U/L    Comment: Performed at [facility], [address], [place], [place] [address]  Comprehensive metabolic panel     Status: Abnormal   Collection Time: 02/14/23 12:06 PM  Result Value Ref Range   Sodium 139 135 - 145 mmol/L   Potassium 3.7 3.5 - 5.1 mmol/L   Chloride 101 98 - 111 mmol/L   CO2 24 22 - 32 mmol/L   Glucose, Bld 167 (H) 70 - 99 mg/dL    Comment: Glucose reference range applies only to samples taken after fasting for at least 8 hours.   BUN 22 (H) 6 - 20 mg/dL   Creatinine, Ser 4.33 0.61 - 1.24 mg/dL   Calcium 8.9 8.9 - 29.5 mg/dL   Total Protein 6.7 6.5 - 8.1 g/dL   Albumin 3.9 3.5 - 5.0 g/dL   AST 26 15 - 41 U/L   ALT 31 0 - 44 U/L   Alkaline Phosphatase 41 38 - 126 U/L   Total Bilirubin 1.9 (H) <1.2 mg/dL   GFR, Estimated >18 >84 mL/min    Comment: (NOTE) Calculated using the CKD-EPI Creatinine Equation (2021)    Anion gap 14 5 - 15    Comment: Performed at [facility], [address], [place], [place] [address]  CBC     Status: Abnormal   Collection Time: 02/14/23 12:06 PM  Result Value Ref  Range   WBC 13.0 (H) 4.0 - 10.5 K/uL   RBC 5.44 4.22 - 5.81 MIL/uL   Hemoglobin 18.5 (H) 13.0 - 17.0 g/dL   HCT 30.1 60.1 - 09.3 %    Comment: REPEATED TO VERIFY   MCV 95.6 80.0 - 100.0 fL   MCH 34.0 26.0 - 34.0 pg   MCHC 35.6 30.0 - 36.0 g/dL   RDW 23.5 57.3 - 22.0 %   Platelets 265 150 - 400 K/uL   nRBC 0.0 0.0 - 0.2 %    Comment: Performed at [facility], [address] [place], [place] [address]  Troponin I (High Sensitivity)     Status: None   Collection Time: 02/14/23 12:06 PM  Result Value Ref Range   Troponin I (High Sensitivity) 4 <18 ng/L    Comment: (NOTE) Elevated high sensitivity troponin I (hsTnI) values and significant  changes across serial measurements may suggest ACS but many other  chronic and acute conditions are known to elevate hsTnI results.  Refer to the "Links" section for chest pain algorithms and additional  guidance. Performed at [facility], [address], [place], [place] [address]   Urinalysis, Routine w reflex microscopic -     Status: Abnormal   Collection Time: 02/14/23 12:46 PM  Result Value  Ref Range   Color, Urine YELLOW YELLOW   APPearance CLEAR CLEAR   Specific Gravity, Urine 1.027 1.005 - 1.030   pH 5.0 5.0 - 8.0   Glucose, UA >=500 (A) NEGATIVE mg/dL   Hgb urine dipstick NEGATIVE NEGATIVE   Bilirubin Urine NEGATIVE NEGATIVE   Ketones, ur NEGATIVE NEGATIVE mg/dL   Protein, ur NEGATIVE NEGATIVE mg/dL   Nitrite NEGATIVE NEGATIVE   Leukocytes,Ua NEGATIVE NEGATIVE   RBC / HPF 0-5 0 - 5 RBC/hpf   WBC, UA 0-5 0 - 5 WBC/hpf   Bacteria, UA NONE SEEN NONE SEEN   Squamous Epithelial / HPF 0-5 0 - 5 /HPF   Mucus PRESENT     Comment: Performed at [facility], [address] [place], [place] [address]   Personally reviewed and showed patient and family- pneumoperitoneum all the way from the pelvis to the epigastric region, possible thickened sigmoid colon with diverticula  DG Chest Port 1 View Result Date: 02/14/2023 CLINICAL DATA:   Shortness of breath. EXAM: PORTABLE CHEST 1 VIEW COMPARISON:  02/16/2020. FINDINGS: Low lung volume. Mild pulmonary vascular congestion, which may be accentuated by low lung volume. No frank pulmonary edema. Bilateral lung fields are otherwise clear. No acute consolidation or lung collapse. Bilateral costophrenic angles are clear. Normal cardio-mediastinal silhouette. No acute osseous abnormalities. The soft tissues are within normal limits. IMPRESSION: *Mild diffuse pulmonary vascular congestion. Otherwise, no acute cardiopulmonary abnormality. Electronically Signed   By: Jules Schick M.D.   On: 02/14/2023 13:26   CT ABDOMEN PELVIS W CONTRAST Result Date: 02/14/2023 CLINICAL DATA:  Acute generalized abdominal pain. EXAM: CT ABDOMEN AND PELVIS WITH CONTRAST TECHNIQUE: Multidetector CT imaging of the abdomen and pelvis was performed using the standard protocol following bolus administration of intravenous contrast. RADIATION DOSE REDUCTION: This exam was performed according to the departmental dose-optimization program which includes automated exposure control, adjustment of the mA and/or kV according to patient size and/or use of iterative reconstruction technique. CONTRAST:  OMNIPAQUE IOHEXOL 300 MG/ML  SOLN COMPARISON:  February 11, 2020. FINDINGS: Lower chest: No acute abnormality. Hepatobiliary: No focal liver abnormality is seen. No gallstones, gallbladder wall thickening, or biliary dilatation. Pancreas: Unremarkable. No pancreatic ductal dilatation or surrounding inflammatory changes. Spleen: Normal in size without focal abnormality. Adrenals/Urinary Tract: Adrenal glands appear normal. Stable left renal cyst is noted for which no further follow-up is required. No hydronephrosis or renal obstruction is noted. Urinary bladder is unremarkable. Stomach/Bowel: The stomach is unremarkable. The appendix appears normal. Mild amount of free air is noted in the epigastric region and in the pelvis  consistent with rupture of hollow viscus. Mildly dilated and thick walled small bowel loops are noted in the pelvis and lower abdomen suggesting enteritis. There also appears to be mild sigmoid wall thickening suggesting possible diverticulitis. There is noted air between a small bowel loop and sigmoid colon, and therefore the source of perforation is not clearly identifiable. Vascular/Lymphatic: No significant vascular findings are present. No enlarged abdominal or pelvic lymph nodes. Reproductive: Prostate is unremarkable. Other: No ascites or hernia is noted. Musculoskeletal: No acute or significant osseous findings. IMPRESSION: Pneumoperitoneum is noted both in the epigastric and pelvic regions concerning for rupture of hollow viscus. Mildly dilated and inflamed small bowel loops are noted in lower abdomen and pelvis as well as mild sigmoid wall thickening suggesting possible inflammation there is well. It is uncertain if the pneumoperitoneum is secondary to ruptured small or large bowel. Critical Value/emergent results were called by telephone at  the time of interpretation on 02/14/2023 at 1:22 pm to provider JOSEPH ZAMMIT , who verbally acknowledged these results. Electronically Signed   By: Lupita Raider M.D.   On: 02/14/2023 13:24     Assessment & Plan:  Ryan Gibson is a 47 y.o. male with pneumoperitoneum of unknown source. Discussed risk of bleeding, infection, colostomy, bowel resection if this was diverticulitis perforation, discussed risk of drain placement and patch if ulcer perforation. Discussed post operative course and that he could get sicker before he gets better and that if he needs higher level ICU than we can offer he may need transferred. He has received 2L in the ED. Type and screen done.  Dr. Laural Benes has seen to help with medical management. Everything discussed with patient and family. Wife signed consent at patient's request. Discussed Eliquis with pharmacy and they  discussed with colleagues. Kcentra dosing given for hemostasis. Will start back heparin gtt as soon as seems reasonable given history.   Blood consent obtained.  All questions were answered to the satisfaction of the patient and family.   Lucretia Roers 02/14/2023, 2:59 PM

## 2023-02-14 NOTE — ED Notes (Signed)
All belongings taken by Pt's wife.  Report given to OR staff.

## 2023-02-14 NOTE — Op Note (Signed)
[facility] Operative Note  02/14/23  Preoperative Diagnosis: Pneumoperitoneum    Postoperative Diagnosis: Same   Procedure(s) Performed: Exploratory laparotomy, open lesser sac and greater sac, JP drain placement over stomach and into pelvis    Surgeon: Leatrice Jewels. Henreitta Leber, MD   Assistants: No qualified resident was available    Anesthesia: General endotracheal   Anesthesiologist: Dr. Johnnette Litter, MD    Specimens: Culture -purulent ascites    Estimated Blood Loss: Minimal   Blood Replacement: None    Complications: None   Wound Class: Dirty infected    Operative Indications: Mr. Aragonez is a 47 yo who has pneumoperitoneum of unknown source on CT. We discussed exploration possible bowel resection, colostomy, drain placement, and risk of bleeding, infection, and post operative course with potential for worsening sepsis and ICU management needing transfer. Discussed Kcentra for Eliquis reversal and restarting blood thinner as soon as possible.   Findings:Upon entering the abdomen (organ space), I encountered purulent ascites, no source of perforation was identified after extensive exploration.     Procedure: The patient was taken to the operating room and placed supine. General endotracheal anesthesia was induced. Intravenous antibiotics were  administered per protocol.  A foley catheter was placed. The abdomen was prepped and draped in the usual sterile fashion.   A midline wound was made and carried down to the fascia. Care was taken to enter with scissors. Upon entering the abdomen (organ space), I encountered purulent ascites. Cultures were obtained. A wound protector was placed. Suction was performed. The small bowel was eviscerated and copious amounts of purulent ascites came from the right lower quadrant. I looked into the area and the cecum was holding gas and no signs of inflammation or phlegmon were noted. The appendix was normal. I ran the small bowel from the  terminal ileum back noting no signs of perforation. The bowel in general looked injected from the ascites.  The sigmoid colon was felt and looked at. There was no phlegmon changes or specific areas of  thickening.  There was no signs of any stool in the abdomen. I then looked into the upper abdomen. More purulent ascites was evacuated from between the liver and stomach. The stomach was full of air. I had anesthesia place an NG and looked at the stomach anteriorly noting no signs of perforation or omental  rind over a perforation. I then opened the greater sac and lesser sac with a ligasure to fully expose the posterior stomach. I did not note any signs of perforation. I had anesthesia fill the stomach with air with some fluid over  the anterior stomach and no bubbles were noted. The stomach was decompressed.  The ran the small bowel and looked at the entire colon multiple times. Looking at the stomach, small bowel and colon at least 4 separate times. I did not see any obvious source of perforation or significant enough changes to make me predict an obvious source of  the perforation.  I washed out the abdomen with 3L of warm saline. The small bowel was placed into the abdomen in anatomic alignment. A JP was placed in the RLQ to the pelvis and in the LUQ overlying the stomach given that this is the two most likely sources of perforation.  The abdomen was closed with 0 PDS suture. The midline was irrigated and the skin was loosely closed with staples. Marcaine was injected. Sterile dressings were placed.   Final inspection revealed acceptable hemostasis. All counts were correct at the  end of the case. The patient was awakened from anesthesia and extubated without complication.  He had received multiple boluses and his vitals had improved from preoperative reading. The Foley and NG were left in place. The patient went to the PACU in stable condition.   Algis Greenhouse, MD [facility] [address] [place], [place] [address] [phone] (office)

## 2023-02-14 NOTE — Progress Notes (Signed)
Pharmacy Antibiotic Note  Ranaldo Barasch is a 47 y.o. male admitted on 02/14/2023 with intra-abdominal infection.  Pharmacy has been consulted for zosyn dosing.  Plan: Zosyn 3.375g IV q8h (4 hour infusion).  Height: 6\' 2"  (188 cm) Weight: 109.8 kg (242 lb) IBW/kg (Calculated) : 82.2  Temp (24hrs), Avg:98.2 F (36.8 C), Min:98.2 F (36.8 C), Max:98.2 F (36.8 C)  Recent Labs  Lab 02/14/23 1206  WBC 13.0*  CREATININE 1.04    Estimated Creatinine Clearance: 115.8 mL/min (by C-G formula based on SCr of 1.04 mg/dL).    Allergies  Allergen Reactions   Vancomycin Hives    hives post op on R arm -site of infusion, and whole body itching    Antimicrobials this admission: Zosyn 12/24 >> CTX 12/24  Microbiology results: 12/24 BCx: pending  12/24 MRSA PCR: pending  Thank you for allowing pharmacy to be a part of this patient's care.  Judeth Cornfield, PharmD Clinical Pharmacist 02/14/2023 4:07 PM

## 2023-02-14 NOTE — ED Provider Notes (Signed)
[facility] AT [facility] Provider Note   CSN: [id number] Arrival date & time: 02/14/23  1131     History  Chief Complaint  Patient presents with   Abdominal Pain   Emesis   Diarrhea    Ryan Gibson is a 47 y.o. male.  Patient complains of severe abdominal pain that started this morning with vomiting.  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 A language interpreter was used.  Abdominal Pain Pain location:  Generalized Pain quality: aching   Pain radiates to:  Does not radiate Pain severity:  Severe Onset quality:  Sudden Timing:  Constant Progression:  Worsening Chronicity:  New Context: not medication withdrawal   Associated symptoms: diarrhea and vomiting   Associated symptoms: no chest pain, no cough, no fatigue and no hematuria   Emesis Associated symptoms: abdominal pain and diarrhea   Associated symptoms: no cough and no headaches   Diarrhea Associated symptoms: abdominal pain and vomiting   Associated symptoms: no headaches        Home Medications Prior to Admission medications   Medication Sig Start Date End Date Taking? Authorizing Provider  Abatacept (ORENCIA IV) Inject 1,000 mg into the vein every 28 (twenty-eight) days.    Pollyann Savoy, MD  colchicine 0.6 MG tablet TAKE 1 TABLET BY MOUTH EVERY DAY 01/23/23   Gearldine Bienenstock, PA-C  DULoxetine (CYMBALTA) 60 MG capsule Take 1 capsule (60 mg total) by mouth daily. 11/08/22   Cook, Jayce G, DO  ELIQUIS 5 MG TABS tablet Take 1 tablet (5 mg total) by mouth 2 (two) times daily. 11/23/22   Sharlene Dory, PA-C  empagliflozin (JARDIANCE) 10 MG TABS tablet Take 1 tablet (10 mg total) by mouth daily before breakfast. 11/23/22   Sharlene Dory, PA-C  Febuxostat 80 MG TABS TAKE 1 TABLET BY MOUTH EVERY DAY 01/23/23   Gearldine Bienenstock, PA-C  furosemide (LASIX) 40 MG tablet Take 80 mg by mouth daily.    [provider]  metoprolol succinate (TOPROL-XL) 100 MG 24 hr tablet Take 1.5 tablets (150 mg total) by mouth daily. Take with or immediately following a meal. 11/23/22 07/21/23  Sharlene Dory, PA-C  oxyCODONE-acetaminophen (PERCOCET/ROXICET) 5-325 MG tablet Take 1 tablet by mouth every 8 (eight) hours as needed for severe pain (pain score 7-10). 02/07/23   Tommie Sams, DO  predniSONE (DELTASONE) 5 MG tablet Take 4 tabs po qd x 14 days, 3  tabs po qd x 14 days, 2  tabs po qd x 14 days, 1  tab po qd x 14 days 01/31/23   Gearldine Bienenstock, PA-C  sacubitril-valsartan (ENTRESTO) 49-51 MG Take 1 tablet by mouth 2 (two) times daily. 11/23/22   Sharlene Dory, PA-C  spironolactone (ALDACTONE) 25 MG tablet Take 1 tablet (25 mg total) by mouth daily. 11/23/22 11/18/23  Sharlene Dory, PA-C      Allergies    Vancomycin    Review of Systems   Review of Systems  Constitutional:  Negative for appetite change and fatigue.  HENT:  Negative for congestion, ear discharge and sinus pressure.   Eyes:  Negative for discharge.  Respiratory:  Negative for cough.   Cardiovascular:  Negative for chest pain.  Gastrointestinal:  Positive for abdominal pain, diarrhea and vomiting.  Genitourinary:  Negative for frequency and hematuria.  Musculoskeletal:  Negative for back pain.  Skin:  Negative for rash.  Neurological:  Negative for seizures and headaches.  Psychiatric/Behavioral:  Negative for hallucinations.     Physical Exam Updated Vital Signs BP (!) 133/93   Pulse (!) 118   Temp 98.2 F (36.8 C) (Oral)   Resp (!) 22   Ht 6\' 2"  (1.88 m)   Wt 109.8 kg   SpO2 97%   BMI 31.07 kg/m  Physical Exam Vitals and nursing note reviewed.  Constitutional:      Appearance: He is well-developed.  HENT:     Head: Normocephalic.     Nose: Nose normal.  Eyes:     General: No scleral icterus.    Conjunctiva/sclera: Conjunctivae normal.  Neck:     Thyroid: No thyromegaly.  Cardiovascular:     Rate and Rhythm:  Normal rate and regular rhythm.     Heart sounds: No murmur heard.    No friction rub. No gallop.  Pulmonary:     Breath sounds: No stridor. No wheezing or rales.  Chest:     Chest wall: No tenderness.  Abdominal:     General: There is no distension.     Tenderness: There is abdominal tenderness. There is rebound.  Musculoskeletal:        General: Normal range of motion.     Cervical back: Neck supple.  Lymphadenopathy:     Cervical: No cervical adenopathy.  Skin:    Findings: No erythema or rash.  Neurological:     Mental Status: He is alert and oriented to person, place, and time.     Motor: No abnormal muscle tone.     Coordination: Coordination normal.  Psychiatric:        Behavior: Behavior normal.     ED Results / Procedures / Treatments   Labs (all labs ordered are listed, but only abnormal results are displayed) Labs Reviewed  COMPREHENSIVE METABOLIC PANEL - Abnormal; Notable for the following components:      Result Value   Glucose, Bld 167 (*)    BUN 22 (*)    Total Bilirubin 1.9 (*)    All other components within normal limits  CBC - Abnormal; Notable for the following components:   WBC 13.0 (*)    Hemoglobin 18.5 (*)    All other components within normal limits  URINALYSIS, ROUTINE W REFLEX MICROSCOPIC - Abnormal; Notable for the following components:   Glucose, UA >=500 (*)    All other components within normal limits  RESP PANEL BY RT-PCR (RSV, FLU A&B, COVID)  RVPGX2  LIPASE, BLOOD  TROPONIN I (HIGH SENSITIVITY)    EKG EKG Interpretation Date/Time:  Tuesday February 14 2023 11:43:25 EST Ventricular Rate:  114 PR Interval:  144 QRS Duration:  98 QT Interval:  342 QTC Calculation: 471 R Axis:   -22  Text Interpretation: Sinus tachycardia Borderline left axis deviation Confirmed by Bethann Berkshire [phone]) on 02/14/2023 12:53:44 PM  Radiology DG Chest Port 1 View Result Date: 02/14/2023 CLINICAL DATA:  Shortness of breath. EXAM: PORTABLE CHEST  1 VIEW COMPARISON:  02/16/2020. FINDINGS: Low lung volume. Mild pulmonary vascular congestion, which may be accentuated by low lung volume. No frank pulmonary edema. Bilateral lung fields are otherwise clear. No acute consolidation or lung collapse. Bilateral costophrenic angles are clear. Normal cardio-mediastinal silhouette. No acute osseous abnormalities. The soft tissues are within normal limits. IMPRESSION: *Mild diffuse pulmonary vascular congestion. Otherwise, no acute cardiopulmonary abnormality. Electronically Signed   By: Jules Schick M.D.   On: 02/14/2023 13:26  CT ABDOMEN PELVIS W CONTRAST Result Date: 02/14/2023 CLINICAL DATA:  Acute generalized abdominal pain. EXAM: CT ABDOMEN AND PELVIS WITH CONTRAST TECHNIQUE: Multidetector CT imaging of the abdomen and pelvis was performed using the standard protocol following bolus administration of intravenous contrast. RADIATION DOSE REDUCTION: This exam was performed according to the departmental dose-optimization program which includes automated exposure control, adjustment of the mA and/or kV according to patient size and/or use of iterative reconstruction technique. CONTRAST:  OMNIPAQUE IOHEXOL 300 MG/ML  SOLN COMPARISON:  February 11, 2020. FINDINGS: Lower chest: No acute abnormality. Hepatobiliary: No focal liver abnormality is seen. No gallstones, gallbladder wall thickening, or biliary dilatation. Pancreas: Unremarkable. No pancreatic ductal dilatation or surrounding inflammatory changes. Spleen: Normal in size without focal abnormality. Adrenals/Urinary Tract: Adrenal glands appear normal. Stable left renal cyst is noted for which no further follow-up is required. No hydronephrosis or renal obstruction is noted. Urinary bladder is unremarkable. Stomach/Bowel: The stomach is unremarkable. The appendix appears normal. Mild amount of free air is noted in the epigastric region and in the pelvis consistent with rupture of hollow viscus. Mildly  dilated and thick walled small bowel loops are noted in the pelvis and lower abdomen suggesting enteritis. There also appears to be mild sigmoid wall thickening suggesting possible diverticulitis. There is noted air between a small bowel loop and sigmoid colon, and therefore the source of perforation is not clearly identifiable. Vascular/Lymphatic: No significant vascular findings are present. No enlarged abdominal or pelvic lymph nodes. Reproductive: Prostate is unremarkable. Other: No ascites or hernia is noted. Musculoskeletal: No acute or significant osseous findings. IMPRESSION: Pneumoperitoneum is noted both in the epigastric and pelvic regions concerning for rupture of hollow viscus. Mildly dilated and inflamed small bowel loops are noted in lower abdomen and pelvis as well as mild sigmoid wall thickening suggesting possible inflammation there is well. It is uncertain if the pneumoperitoneum is secondary to ruptured small or large bowel. Critical Value/emergent results were called by telephone at the time of interpretation on 02/14/2023 at 1:22 pm to provider Vira Chaplin , who verbally acknowledged these results. Electronically Signed   By: Lupita Raider M.D.   On: 02/14/2023 13:24    Procedures Procedures    Medications Ordered in ED Medications  sodium chloride 0.9 % bolus 1,000 mL (has no administration in time range)  cefTRIAXone (ROCEPHIN) 2 g in sodium chloride 0.9 % 100 mL IVPB (has no administration in time range)  sodium chloride 0.9 % bolus 1,000 mL (0 mLs Intravenous Stopped 02/14/23 1306)  HYDROmorphone (DILAUDID) injection 1 mg (1 mg Intravenous Given 02/14/23 1217)  LORazepam (ATIVAN) injection 0.5 mg (0.5 mg Intravenous Given 02/14/23 1217)  ondansetron (ZOFRAN) injection 4 mg (4 mg Intravenous Given 02/14/23 1241)  iohexol (OMNIPAQUE) 300 MG/ML solution 100 mL (100 mLs Intravenous Contrast Given 02/14/23 1253)    ED Course/ Medical Decision Making/ A&P CRITICAL  CARE Performed by: Bethann Berkshire Total critical care time: 44 minutes Critical care time was exclusive of separately billable procedures and treating other patients. Critical care was necessary to treat or prevent imminent or life-threatening deterioration. Critical care was time spent personally by me on the following activities: development of treatment plan with patient and/or surrogate as well as nursing, discussions with consultants, evaluation of patient's response to treatment, examination of patient, obtaining history from patient or surrogate, ordering and performing treatments and interventions, ordering and review of laboratory studies, ordering and review of radiographic studies, pulse oximetry and re-evaluation of patient's condition.  Click here for ABCD2, HEART and other calculatorsREFRESH Note before signing :1}                              Medical Decision Making Amount and/or Complexity of Data Reviewed Labs: ordered. Radiology: ordered.  Risk Prescription drug management. Decision regarding hospitalization.  This patient presents to the ED for concern of abdominal pain, this involves an extensive number of treatment options, and is a complaint that carries with it a high risk of complications and morbidity.  The differential diagnosis includes appendicitis, cholecystitis   Co morbidities that complicate the patient evaluation  PE   Additional history obtained:  Additional history obtained from wife External records from outside source obtained and reviewed including hospital records   Lab Tests:  I Ordered, and personally interpreted labs.  The pertinent results include: White count 13,000   Imaging Studies ordered:  I ordered imaging studies including CT abdomen I independently visualized and interpreted imaging which showed perforated viscus I agree with the radiologist interpretation   Cardiac Monitoring: / EKG:  The patient was maintained on a  cardiac monitor.  I personally viewed and interpreted the cardiac monitored which showed an underlying rhythm of: Sinus tach   Consultations Obtained:  I requested consultation with the general surgery and hospitalist,  and discussed lab and imaging findings as well as pertinent plan - they recommend: Admit to hospitalist with general surgery taken the patient to surgery   Problem List / ED Course / Critical interventions / Medication management  Abdominal pain and ruptured viscus and history of PE I ordered medication including Rocephin for abdominal infection Reevaluation of the patient after these medicines showed that the patient stayed the same I have reviewed the patients home medicines and have made adjustments as needed   Social Determinants of Health:  None   Test / Admission - Considered:  None  Patient with perforated viscus.  He will be seen by general surgery and most likely taken to surgery to repair the perforation        Final Clinical Impression(s) / ED Diagnoses Final diagnoses:  Perforated viscus    Rx / DC Orders ED Discharge Orders     None         Bethann Berkshire, MD 02/17/23 [phone]

## 2023-02-15 ENCOUNTER — Encounter (HOSPITAL_COMMUNITY): Payer: Self-pay | Admitting: General Surgery

## 2023-02-15 ENCOUNTER — Inpatient Hospital Stay (HOSPITAL_COMMUNITY): Payer: BC Managed Care – PPO

## 2023-02-15 DIAGNOSIS — R918 Other nonspecific abnormal finding of lung field: Secondary | ICD-10-CM | POA: Diagnosis not present

## 2023-02-15 DIAGNOSIS — Z4682 Encounter for fitting and adjustment of non-vascular catheter: Secondary | ICD-10-CM | POA: Diagnosis not present

## 2023-02-15 DIAGNOSIS — R198 Other specified symptoms and signs involving the digestive system and abdomen: Secondary | ICD-10-CM | POA: Diagnosis not present

## 2023-02-15 DIAGNOSIS — I5022 Chronic systolic (congestive) heart failure: Secondary | ICD-10-CM

## 2023-02-15 DIAGNOSIS — D6869 Other thrombophilia: Secondary | ICD-10-CM | POA: Diagnosis not present

## 2023-02-15 DIAGNOSIS — R1084 Generalized abdominal pain: Secondary | ICD-10-CM

## 2023-02-15 DIAGNOSIS — R0989 Other specified symptoms and signs involving the circulatory and respiratory systems: Secondary | ICD-10-CM | POA: Diagnosis not present

## 2023-02-15 DIAGNOSIS — R000 Tachycardia, unspecified: Secondary | ICD-10-CM

## 2023-02-15 DIAGNOSIS — D72829 Elevated white blood cell count, unspecified: Secondary | ICD-10-CM | POA: Diagnosis not present

## 2023-02-15 DIAGNOSIS — Z452 Encounter for adjustment and management of vascular access device: Secondary | ICD-10-CM | POA: Diagnosis not present

## 2023-02-15 LAB — CBC WITH DIFFERENTIAL/PLATELET
Abs Immature Granulocytes: 0.08 10*3/uL — ABNORMAL HIGH (ref 0.00–0.07)
Basophils Absolute: 0 10*3/uL (ref 0.0–0.1)
Basophils Relative: 0 %
Eosinophils Absolute: 0 10*3/uL (ref 0.0–0.5)
Eosinophils Relative: 0 %
HCT: 42.5 % (ref 39.0–52.0)
Hemoglobin: 15.1 g/dL (ref 13.0–17.0)
Immature Granulocytes: 1 %
Lymphocytes Relative: 9 %
Lymphs Abs: 1.3 10*3/uL (ref 0.7–4.0)
MCH: 34.4 pg — ABNORMAL HIGH (ref 26.0–34.0)
MCHC: 35.5 g/dL (ref 30.0–36.0)
MCV: 96.8 fL (ref 80.0–100.0)
Monocytes Absolute: 0.8 10*3/uL (ref 0.1–1.0)
Monocytes Relative: 6 %
Neutro Abs: 11.9 10*3/uL — ABNORMAL HIGH (ref 1.7–7.7)
Neutrophils Relative %: 84 %
Platelets: 179 10*3/uL (ref 150–400)
RBC: 4.39 MIL/uL (ref 4.22–5.81)
RDW: 13.6 % (ref 11.5–15.5)
WBC: 14.2 10*3/uL — ABNORMAL HIGH (ref 4.0–10.5)
nRBC: 0 % (ref 0.0–0.2)

## 2023-02-15 LAB — COMPREHENSIVE METABOLIC PANEL
ALT: 25 U/L (ref 0–44)
AST: 23 U/L (ref 15–41)
Albumin: 2.8 g/dL — ABNORMAL LOW (ref 3.5–5.0)
Alkaline Phosphatase: 31 U/L — ABNORMAL LOW (ref 38–126)
Anion gap: 8 (ref 5–15)
BUN: 19 mg/dL (ref 6–20)
CO2: 24 mmol/L (ref 22–32)
Calcium: 7.8 mg/dL — ABNORMAL LOW (ref 8.9–10.3)
Chloride: 107 mmol/L (ref 98–111)
Creatinine, Ser: 0.82 mg/dL (ref 0.61–1.24)
GFR, Estimated: >60 mL/min (ref >60)
Glucose, Bld: 121 mg/dL — ABNORMAL HIGH (ref 70–99)
Potassium: 3.8 mmol/L (ref 3.5–5.1)
Sodium: 139 mmol/L (ref 135–145)
Total Bilirubin: 1.2 mg/dL — ABNORMAL HIGH (ref <1.2)
Total Protein: 5.1 g/dL — ABNORMAL LOW (ref 6.5–8.1)

## 2023-02-15 LAB — HEPARIN LEVEL (UNFRACTIONATED): Heparin Unfractionated: <0.1 [IU]/mL — ABNORMAL LOW (ref 0.30–0.70)

## 2023-02-15 LAB — PROTIME-INR
INR: 1.5 — ABNORMAL HIGH (ref 0.8–1.2)
Prothrombin Time: 18.4 s — ABNORMAL HIGH (ref 11.4–15.2)

## 2023-02-15 LAB — APTT: aPTT: 36 s (ref 24–36)

## 2023-02-15 LAB — PHOSPHORUS: Phosphorus: 4.3 mg/dL (ref 2.5–4.6)

## 2023-02-15 LAB — MAGNESIUM: Magnesium: 1.6 mg/dL — ABNORMAL LOW (ref 1.7–2.4)

## 2023-02-15 LAB — LACTIC ACID, PLASMA: Lactic Acid, Venous: 1.4 mmol/L (ref 0.5–1.9)

## 2023-02-15 MED ORDER — ACETAMINOPHEN 500 MG PO TABS
1000.0000 mg | ORAL_TABLET | Freq: Four times a day (QID) | ORAL | Status: AC
  Administered 2023-02-15 – 2023-02-16 (×4): 1000 mg via ORAL
  Filled 2023-02-15 (×4): qty 2

## 2023-02-15 MED ORDER — LACTATED RINGERS IV SOLN: INTRAVENOUS | Status: DC

## 2023-02-15 MED ORDER — HEPARIN (PORCINE) 25000 UT/250ML-% IV SOLN
2700.0000 [IU]/h | INTRAVENOUS | Status: DC
Start: 2023-02-15 — End: 2023-02-23
  Administered 2023-02-15: 1600 [IU]/h via INTRAVENOUS
  Administered 2023-02-16: 1800 [IU]/h via INTRAVENOUS
  Administered 2023-02-17: 2400 [IU]/h via INTRAVENOUS
  Administered 2023-02-17 – 2023-02-22 (×12): 2700 [IU]/h via INTRAVENOUS
  Filled 2023-02-15 (×17): qty 250

## 2023-02-15 MED ORDER — FOLIC ACID 5 MG/ML IJ SOLN
1.0000 mg | Freq: Every day | INTRAMUSCULAR | Status: DC
  Administered 2023-02-15 – 2023-02-22 (×8): 1 mg via INTRAVENOUS
  Filled 2023-02-15 (×10): qty 0.2

## 2023-02-15 MED ORDER — METOPROLOL TARTRATE 5 MG/5ML IV SOLN
2.5000 mg | Freq: Four times a day (QID) | INTRAVENOUS | Status: DC
  Administered 2023-02-15 – 2023-02-26 (×43): 2.5 mg via INTRAVENOUS
  Filled 2023-02-15 (×43): qty 5

## 2023-02-15 MED ORDER — MAGNESIUM SULFATE 4 GM/100ML IV SOLN
4.0000 g | Freq: Once | INTRAVENOUS | Status: AC
  Administered 2023-02-15: 4 g via INTRAVENOUS
  Filled 2023-02-15: qty 100

## 2023-02-15 NOTE — Progress Notes (Addendum)
[facility]  Doing fair. Resting. Main complaint is the abdominal pain and pain medication wearing off. Is on CIWA Protocol too.  BP (!) 168/82   Pulse (!) 127   Temp 98.2 F (36.8 C) (Oral)   Resp (!) 27   Ht 6\' 2"  (1.88 m)   Wt 95.8 kg   SpO2 91%   BMI 27.12 kg/m  JP upper abdomen more dark Ss, about 40cc in bulb JP lower SS minimal in bulb Soft, appropriately tender,distended, midline with honeycomb, old staining, no erythema of staples   H&H Down from baseline but no signs of bleeding Lytes replaced by Dr. Laural Benes  Patient s/p Ex lap for pneumoperitoneum, unknown source, drain placement X 2.  Scheduled tylenol and can hold NG suction PRN narcotics CIWA IS, OOB ordered HR remains elevated but BP Improved  NPO, NG in place, can have some oral meds and hold suction Can have a small cup of water to sip on each shift JP X 2 Labs tomorrow No signs of bleeding but JP in the upper is a little dark SS Heparin gtt can be started but without bolus for history of PE Zosyn for intraabdominal purulence, GNR on culture SCDs  Appreciate Dr. Laural Benes. Outpatient GI EGD/Colonoscopy in 6-8 weeks. CT scan after bowel function returns and before starting diet   Algis Greenhouse, MD [facility] [address] [place], [place] [address] [phone] (office)

## 2023-02-15 NOTE — Plan of Care (Signed)
  Problem: Education: Goal: Knowledge of General Education information will improve Description: Including pain rating scale, medication(s)/side effects and non-pharmacologic comfort measures Outcome: Progressing   Problem: Clinical Measurements: Goal: Will remain free from infection Outcome: Progressing Goal: Respiratory complications will improve Outcome: Progressing   Problem: Pain Management: Goal: General experience of comfort will improve Outcome: Progressing   Problem: Safety: Goal: Ability to remain free from injury will improve Outcome: Progressing   Problem: Skin Integrity: Goal: Risk for impaired skin integrity will decrease Outcome: Progressing

## 2023-02-15 NOTE — Progress Notes (Signed)
PT foley removed without incident. Pt requested to wait to get up to chair until after 18:00 scheduled dose of PO Tylenol. Promoted moving to help with bowel motility.  Pt requests to try getting up later this evening once pain meds take effect.

## 2023-02-15 NOTE — Consult Note (Signed)
.  Pharmacy Consult Note - Anticoagulation  Pharmacy Consult for heparin Indication:  history of pulmonary embolism  PATIENT MEASUREMENTS: Height: 6\' 2"  (188 cm) Weight: 95.8 kg (211 lb 3.2 oz) IBW/kg (Calculated) : 82.2 HEPARIN DW (KG): 95.8  VITAL SIGNS: Temp: 98.3 F (36.8 C) (12/25 1648) Temp Source: Oral (12/25 1648) BP: 168/82 (12/25 1500) Pulse Rate: 127 (12/25 1500)  Recent Labs    02/14/23 1206 02/15/23 0513  HGB 18.5* 15.1  HCT 52.0 42.5  PLT 265 179  CREATININE 1.04 0.82  TROPONINIHS 4  --     Estimated Creatinine Clearance: 129.5 mL/min (by C-G formula based on SCr of 0.82 mg/dL).  PAST MEDICAL HISTORY: Past Medical History:  Diagnosis Date   Asthma    AS A CHILD ONLY   DVT (deep venous thrombosis) (HCC)    Foot pain, right    painful x 1 month and swelling   Idiopathic gout     ASSESSMENT: 47 y.o. male with PMH including pulmonary embolism in 2023 is presenting with a bowel perforation, and is now s/p exploratory laparotomy. Patient is on chronic anticoagulation with Eliquis. Last dose was 02/13/2023 in the evening. Patient received Kcentra yesterday (12/24) due to need for emergent procedure. I am uncertain of Kcentra's impact on antiXa levels in terms of potential false elevation, so will order both aPTT and antiXa level, as well as checking baseline antiXa level. Pharmacy has been consulted to initiate and manage heparin intravenous infusion.  Pertinent medications: Eliquis 5 mg twice daily Last dose 02/13/2023 @ 1800  Goal(s) of therapy: Heparin level 0.3 - 0.7 units/mL aPTT 66 - 102 seconds Monitor platelets by anticoagulation protocol: Yes   Baseline anticoagulation labs: Recent Labs    02/14/23 1206 02/15/23 0513  HGB 18.5* 15.1  PLT 265 179    Date Time aPTT/HL Rate/Comment     PLAN: Start heparin infusion at 1600 units/hour. Omitting bolus per doctor's request Check aPTT and anti-Xa level in 6 hours. Uncertain of Kcentra's  impact on patient's antiXa levels regarding potential false elevation, so will order both aPTT and antiXa level to check for correlation Titrate by aPTT unless anti-Xa level and aPTT correlate are correlating and/or Eliquis washes out, then titrate by anti-Xa level alone Continue to monitor CBC daily while on heparin infusion.   Will M. Dareen Piano, PharmD Clinical Pharmacist 02/15/2023 5:18 PM

## 2023-02-15 NOTE — Progress Notes (Addendum)
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  He presented from home by EMS complaining of severe generalized abdominal pain with nausea vomiting and diarrhea that started early this morning.  The pain is 10/10.  He feels very ill and having palpitations.  He denies fever and chills.  In the ED he was noted to have a rigid abdomen and sent for CT abdomen pelvis with findings of concern for a perforated abdominal viscus.  Emergent surgical consultation requested.  He is started on IV antibiotics and IV fluids and admission requested for further management.   Assessment and Plan:  Sepsis from intraabdominal infection  - pt emergently taken to OR on 12/24 by Dr. Henreitta Leber - sepsis physiology resolving and markedly improved  - zosyn per pharmacy dosing - IV fluid bolus ordered per surgery - lactic acidosis resolved now    Perforated abdominal viscous with pneumoperitoneum - Dr. Henreitta Leber took him to OR emergently after Kcentra treatment on 12/24  - anticipate he will get CT abdomen/pelvis with oral contrast when bowel function returns - continue supportive measures    History of PE/ Acquired thrombophilia - last dose of apixaban was taken 12/23 at 6pm - K-centra was given 12/24   - further anticoagulation pending  surgeon recommendations when safe to resume   Leukocytosis - secondary to abdominal infection and surgery - following blood cultures x 2 : no growth to date  - unfortunately IV ceftriaxone had already been given prior to blood cultures - follow CBC with differential   Hypomagnesemia - IV replacement ordered - recheck in AM   Severe dehydration - he was treated with bolus and maintenance fluids    Hyperbilirubinemia - likely reactive  - trending down    GAD / alcoholism  - severe anxiety at this time - IV lorazepam ordered per CIWA protocol  - supplemental vitamins per CIWA protocol    Generalized abdominal pain  - severe pain being treated with IV medications - IV hydromorphone ordered as needed    Rheumatoid arthritis  - he is immunocompromised - his rheumatologist has him on DMARDS   Tobacco user - nicotine patch ordered    Dilated cardiomyopathy Chronic HFimpEF - He is on GDMT which is currently on hold, resume when able to take p.o.  - Fortunately his recent TTE shows EF improved to 50-55%  - monitor strict intake output and weight  - he presented very dehydrated and has been hydrated  - careful with fluids and monitoring what is being given    Intake/Output Summary (Last 24 hours) at 02/15/2023 1131 Last data filed at 02/15/2023 1027 Gross per 24 hour  Intake 4333.52 ml  Output 1925 ml  Net 2408.52 ml   Filed Weights   02/14/23 1856 02/15/23 0315 02/15/23 0757  Weight: 112.2 kg 95.6 kg 95.8 kg    Sinus tachycardia  -  likely having beta blocker rebound tachycardia  - IV lopressor while NPO with hold parameters - resume home oral metoprolol XL 150 mg when able to take p.o.  DVT prophylaxis: SCDs  Code Status: Full  Family Communication: wife/mother Disposition: Status is: Inpatient   Consultants:  Surgeon Dr. Henreitta Leber   Procedures:  Exp lap, open lesser sac and greater sac, JP drain placement 02/14/23 Antimicrobials:  Zosyn 12/24>>    Subjective: Pt reports abdominal pain and asking to drink water; he denies flatus or BM, some sore throat from NGT.   Objective: Vitals:   02/15/23 0900 02/15/23 1000 02/15/23 1030 02/15/23 1100  BP: (!) 109/55 (!) 135/91  (!) 106/50  Pulse: (!) 116 (!) 117 (!) 114 (!) 119  Resp: 14 17 17 18   Temp:      TempSrc:      SpO2: 94% 96% (!) 89% 94%  Weight:      Height:        Intake/Output Summary (Last 24 hours) at 02/15/2023 1119 Last data filed at 02/15/2023 1027 Gross per 24 hour  Intake 4333.52 ml  Output 1925 ml  Net 2408.52 ml   Filed Weights   02/14/23 1856 02/15/23 0315 02/15/23 0757  Weight: 112.2 kg 95.6 kg 95.8 kg   Examination:  General exam: Appears calm and comfortable  Respiratory system: Clear to auscultation. Respiratory effort normal. Cardiovascular system: normal S1 & S2 heard. No JVD, murmurs, rubs, gallops or clicks. No pedal edema. Gastrointestinal system: Abdomen is nondistended, soft and nontender. No organomegaly or masses felt. Normal bowel sounds heard. Central nervous system: Alert and oriented. No focal neurological deficits. Extremities: Symmetric 5 x 5 power. Skin: No rashes, lesions or ulcers. Psychiatry: Judgement and insight appear normal. Mood & affect appropriate.   Data Reviewed: I have personally reviewed following labs and imaging studies  CBC: Recent Labs  Lab 02/14/23 1206 02/15/23 0513  WBC 13.0* 14.2*  NEUTROABS  --  11.9*  HGB 18.5* 15.1  HCT 52.0 42.5  MCV 95.6 96.8  PLT 265 179    Basic Metabolic Panel: Recent Labs  Lab 02/14/23 1206 02/15/23 0513  NA 139 139  K 3.7 3.8  CL 101 107  CO2 24 24  GLUCOSE 167* 121*  BUN 22* 19  CREATININE 1.04 0.82  CALCIUM 8.9 7.8*  MG  --  1.6*  PHOS  --  4.3    CBG: No results for input(s): "GLUCAP" in the last 168 hours.  Recent Results (from the past 240 hours)  Resp panel by RT-PCR (RSV, Flu A&B, Covid) Anterior Nasal Swab     Status: None   Collection Time:  02/14/23  1:18 PM   Specimen: Anterior Nasal Swab  Result Value Ref Range Status   SARS Coronavirus 2 by RT PCR NEGATIVE NEGATIVE Final    Comment: (NOTE) SARS-CoV-2 target nucleic acids are NOT DETECTED.  The SARS-CoV-2 RNA is generally detectable in upper respiratory specimens during the acute phase of infection. The lowest concentration of SARS-CoV-2 viral copies this assay can detect is 138 copies/mL. A negative result does not preclude SARS-Cov-2 infection and should not be used as the sole basis for treatment or other patient management decisions. A negative result may occur with  improper specimen collection/handling, submission of specimen other than nasopharyngeal swab, presence of viral mutation(s) within the areas targeted by this assay, and inadequate number of viral copies(<138 copies/mL). A negative result must be combined with clinical observations, patient history, and epidemiological information. The expected result  is Negative.  Fact Sheet for Patients:  BloggerCourse.com  Fact Sheet for Healthcare Providers:  SeriousBroker.it  This test is no t yet approved or cleared by the [place] FDA and  has been authorized for detection and/or diagnosis of SARS-CoV-2 by FDA under an Emergency Use Authorization (EUA). This EUA will remain  in effect (meaning this test can be used) for the duration of the COVID-19 declaration under Section 564(b)(1) of the Act, 21 U.S.C.section 360bbb-3(b)(1), unless the authorization is terminated  or revoked sooner.       Influenza A by PCR NEGATIVE NEGATIVE Final   Influenza B by PCR NEGATIVE NEGATIVE Final    Comment: (NOTE) The Xpert Xpress SARS-CoV-2/FLU/RSV plus assay is intended as an aid in the diagnosis of influenza from Nasopharyngeal swab specimens and should not be used as a sole basis for treatment. Nasal washings and aspirates are unacceptable for Xpert Xpress  SARS-CoV-2/FLU/RSV testing.  Fact Sheet for Patients: BloggerCourse.com  Fact Sheet for Healthcare Providers: SeriousBroker.it  This test is not yet approved or cleared by the [place] FDA and has been authorized for detection and/or diagnosis of SARS-CoV-2 by FDA under an Emergency Use Authorization (EUA). This EUA will remain in effect (meaning this test can be used) for the duration of the COVID-19 declaration under Section 564(b)(1) of the Act, 21 U.S.C. section 360bbb-3(b)(1), unless the authorization is terminated or revoked.     Resp Syncytial Virus by PCR NEGATIVE NEGATIVE Final    Comment: (NOTE) Fact Sheet for Patients: BloggerCourse.com  Fact Sheet for Healthcare Providers: SeriousBroker.it  This test is not yet approved or cleared by the [place] FDA and has been authorized for detection and/or diagnosis of SARS-CoV-2 by FDA under an Emergency Use Authorization (EUA). This EUA will remain in effect (meaning this test can be used) for the duration of the COVID-19 declaration under Section 564(b)(1) of the Act, 21 U.S.C. section 360bbb-3(b)(1), unless the authorization is terminated or revoked.  Performed at [facility], [address], [place], [place] [address]   Culture, blood (Routine X 2) w Reflex to ID Panel     Status: None (Preliminary result)   Collection Time: 02/14/23  2:20 PM   Specimen: Right Antecubital; Blood  Result Value Ref Range Status   Specimen Description RIGHT ANTECUBITAL  Final   Special Requests   Final    BOTTLES DRAWN AEROBIC AND ANAEROBIC Blood Culture adequate volume   Culture   Final    NO GROWTH < 24 HOURS Performed at [facility], [address] [place], [place] [address]    Report Status PENDING  Incomplete  Culture, blood (Routine X 2) w Reflex to ID Panel     Status: None (Preliminary result)   Collection Time:  02/14/23  2:56 PM   Specimen: BLOOD  Result Value Ref Range Status   Specimen Description BLOOD BLOOD RIGHT FOREARM  Final   Special Requests   Final    BOTTLES DRAWN AEROBIC AND ANAEROBIC Blood Culture adequate volume   Culture   Final    NO GROWTH < 24 HOURS Performed at [facility], [address], [place], [place] [address]    Report Status PENDING  Incomplete  Aerobic/Anaerobic Culture w Gram Stain (surgical/deep wound)     Status: None (Preliminary result)   Collection Time: 02/14/23  4:17 PM   Specimen: Path fluid; Body Fluid  Result Value Ref Range Status   Specimen Description WOUND  Final   Special Requests PURLANT ASCITES  Final   Gram Stain   Final    ABUNDANT WBC PRESENT, PREDOMINANTLY PMN RARE GRAM NEGATIVE RODS Performed at [facility], [address], [place], [place] [address]    Culture FEW GRAM NEGATIVE RODS  Final   Report Status PENDING  Incomplete  MRSA Next Gen by PCR, Nasal     Status: None   Collection Time: 02/14/23  6:30 PM   Specimen: Nasal Mucosa; Nasal Swab  Result Value Ref Range Status   MRSA by PCR Next Gen NOT DETECTED NOT DETECTED Final    Comment: (NOTE) The GeneXpert MRSA Assay (FDA approved for NASAL specimens only), is one component of a comprehensive MRSA colonization surveillance program. It is not intended to diagnose MRSA infection nor to guide or monitor treatment for MRSA infections. Test performance is not FDA approved in patients less than 37 years old. Performed at [facility], [address] [place], [place] [address]      Radiology Studies: [facility] Chest [facility] 1 View Result Date: 02/14/2023 CLINICAL DATA:  NG tube placement EXAM: PORTABLE CHEST 1 VIEW COMPARISON:  02/14/2023 FINDINGS: NG tube tip is in the fundus of the stomach. Low lung volumes. Right base atelectasis. Left lung clear. Heart is normal size. No effusions. IMPRESSION: NG tube tip in the stomach. Low lung volumes.  Right base atelectasis. Electronically  Signed   By: Charlett Nose M.D.   On: 02/14/2023 19:17   DG Chest Port 1 View Result Date: 02/14/2023 CLINICAL DATA:  Shortness of breath. EXAM: PORTABLE CHEST 1 VIEW COMPARISON:  02/16/2020. FINDINGS: Low lung volume. Mild pulmonary vascular congestion, which may be accentuated by low lung volume. No frank pulmonary edema. Bilateral lung fields are otherwise clear. No acute consolidation or lung collapse. Bilateral costophrenic angles are clear. Normal cardio-mediastinal silhouette. No acute osseous abnormalities. The soft tissues are within normal limits. IMPRESSION: *Mild diffuse pulmonary vascular congestion. Otherwise, no acute cardiopulmonary abnormality. Electronically Signed   By: Jules Schick M.D.   On: 02/14/2023 13:26   CT ABDOMEN PELVIS W CONTRAST Result Date: 02/14/2023 CLINICAL DATA:  Acute generalized abdominal pain. EXAM: CT ABDOMEN AND PELVIS WITH CONTRAST TECHNIQUE: Multidetector CT imaging of the abdomen and pelvis was performed using the standard protocol following bolus administration of intravenous contrast. RADIATION DOSE REDUCTION: This exam was performed according to the departmental dose-optimization program which includes automated exposure control, adjustment of the mA and/or kV according to patient size and/or use of iterative reconstruction technique. CONTRAST:  OMNIPAQUE IOHEXOL 300 MG/ML  SOLN COMPARISON:  February 11, 2020. FINDINGS: Lower chest: No acute abnormality. Hepatobiliary: No focal liver abnormality is seen. No gallstones, gallbladder wall thickening, or biliary dilatation. Pancreas: Unremarkable. No pancreatic ductal dilatation or surrounding inflammatory changes. Spleen: Normal in size without focal abnormality. Adrenals/Urinary Tract: Adrenal glands appear normal. Stable left renal cyst is noted for which no further follow-up is required. No hydronephrosis or renal obstruction is noted. Urinary bladder is unremarkable. Stomach/Bowel: The stomach is  unremarkable. The appendix appears normal. Mild amount of free air is noted in the epigastric region and in the pelvis consistent with rupture of hollow viscus. Mildly dilated and thick walled small bowel loops are noted in the pelvis and lower abdomen suggesting enteritis. There also appears to be mild sigmoid wall thickening suggesting possible diverticulitis. There is noted air between a small bowel loop and sigmoid colon, and therefore the source of perforation is not clearly identifiable. Vascular/Lymphatic: No significant vascular findings are present. No enlarged abdominal  or pelvic lymph nodes. Reproductive: Prostate is unremarkable. Other: No ascites or hernia is noted. Musculoskeletal: No acute or significant osseous findings. IMPRESSION: Pneumoperitoneum is noted both in the epigastric and pelvic regions concerning for rupture of hollow viscus. Mildly dilated and inflamed small bowel loops are noted in lower abdomen and pelvis as well as mild sigmoid wall thickening suggesting possible inflammation there is well. It is uncertain if the pneumoperitoneum is secondary to ruptured small or large bowel. Critical Value/emergent results were called by telephone at the time of interpretation on 02/14/2023 at 1:22 pm to provider JOSEPH ZAMMIT , who verbally acknowledged these results. Electronically Signed   By: Lupita Raider M.D.   On: 02/14/2023 13:24   Scheduled Meds:  Chlorhexidine Gluconate Cloth  6 each Topical Daily   folic acid  1 mg Intravenous Daily   multivitamin with minerals  1 tablet Oral Daily   nicotine  14 mg Transdermal q1600   pantoprazole (PROTONIX) IV  40 mg Intravenous Q12H   thiamine  100 mg Oral Daily   Or   thiamine  100 mg Intravenous Daily   Continuous Infusions:  lactated ringers 150 mL/hr at 02/15/23 1027   piperacillin-tazobactam (ZOSYN)  IV Stopped (02/15/23 1019)     LOS: 1 day   Critical Care Procedure Note Authorized and Performed by: Maryln Manuel MD  Total  Critical Care time:  50 mins Due to a high probability of clinically significant, life threatening deterioration, the patient required my highest level of preparedness to intervene emergently and I personally spent this critical care time directly and personally managing the patient.  This critical care time included obtaining a history; examining the patient, pulse oximetry; ordering and review of studies; arranging urgent treatment with development of a management plan; evaluation of patient's response of treatment; frequent reassessment; and discussions with other providers.  This critical care time was performed to assess and manage the high probability of imminent and life threatening deterioration that could result in multi-organ failure.  It was exclusive of separately billable procedures and treating other patients and teaching time.   Standley Dakins, MD How to contact the [facility] Attending or Consulting provider 7A - 7P or covering provider during after hours 7P -7A, for this patient?  Check the care team in [facility] and look for a) attending/consulting TRH provider listed and b) the [facility] team listed Log into [email or web address] to find provider on call.  Locate the [facility] provider you are looking for under [facility] and page to a number that you can be directly reached. If you still have difficulty reaching the provider, please page the [facility] (Director on Call) for the Hospitalists listed on amion for assistance.  02/15/2023, 11:19 AM

## 2023-02-15 NOTE — Progress Notes (Signed)
Final check on patient during shift and pt had pulled out his NG tube. Stated he could now eat and drink.  MD notified and v/o given to replace tube.  Attempted NG tube placement x1. Pt tolerated procedure well. Verified placement with air at bedside.  Portable CXR ordered to verify placement.  V/o received for mitts to aid in pt not removing NG tube

## 2023-02-15 NOTE — Progress Notes (Signed)
   02/14/23 2100  Provider Notification  Provider Name/Title Dr. Lazarus Salines  Date Provider Notified 02/14/23  Time Provider Notified 2100  Method of Notification Page  Notification Reason Critical Result  Test performed and critical result Lactic acid 2.9  Date Critical Result Received 02/14/23  Time Critical Result Received 2100  Provider response See new orders

## 2023-02-15 NOTE — Progress Notes (Signed)
   02/15/23 0909  TOC Brief Assessment  Insurance and Status Reviewed  Patient has primary care physician Yes  Home environment has been reviewed from home  Prior level of function: independent  Prior/Current Home Services No current home services  Social Drivers of Health Review SDOH reviewed no interventions necessary  Readmission risk has been reviewed Yes  Transition of care needs no transition of care needs at this time    SA treatment resource information added to pt's AVS.  Transition of Care Department (TOC) has reviewed patient and no TOC needs have been identified at this time. We will continue to monitor patient advancement through interdisciplinary progression rounds. If new patient transition needs arise, please place a TOC consult.

## 2023-02-15 NOTE — Progress Notes (Signed)
Pt silver ring/band returned to pt and pt wife at  bedside.

## 2023-02-15 NOTE — Plan of Care (Signed)

## 2023-02-16 ENCOUNTER — Other Ambulatory Visit: Payer: Self-pay

## 2023-02-16 DIAGNOSIS — I420 Dilated cardiomyopathy: Secondary | ICD-10-CM

## 2023-02-16 DIAGNOSIS — R198 Other specified symptoms and signs involving the digestive system and abdomen: Secondary | ICD-10-CM | POA: Diagnosis not present

## 2023-02-16 DIAGNOSIS — R1084 Generalized abdominal pain: Secondary | ICD-10-CM | POA: Diagnosis not present

## 2023-02-16 DIAGNOSIS — A419 Sepsis, unspecified organism: Secondary | ICD-10-CM | POA: Diagnosis not present

## 2023-02-16 DIAGNOSIS — D6869 Other thrombophilia: Secondary | ICD-10-CM | POA: Diagnosis not present

## 2023-02-16 LAB — CBC WITH DIFFERENTIAL/PLATELET
Abs Immature Granulocytes: 0.15 10*3/uL — ABNORMAL HIGH (ref 0.00–0.07)
Basophils Absolute: 0 10*3/uL (ref 0.0–0.1)
Basophils Relative: 0 %
Eosinophils Absolute: 0.2 10*3/uL (ref 0.0–0.5)
Eosinophils Relative: 2 %
HCT: 37.5 % — ABNORMAL LOW (ref 39.0–52.0)
Hemoglobin: 12.7 g/dL — ABNORMAL LOW (ref 13.0–17.0)
Immature Granulocytes: 1 %
Lymphocytes Relative: 12 %
Lymphs Abs: 1.6 10*3/uL (ref 0.7–4.0)
MCH: 33.2 pg (ref 26.0–34.0)
MCHC: 33.9 g/dL (ref 30.0–36.0)
MCV: 97.9 fL (ref 80.0–100.0)
Monocytes Absolute: 0.8 10*3/uL (ref 0.1–1.0)
Monocytes Relative: 6 %
Neutro Abs: 10.5 10*3/uL — ABNORMAL HIGH (ref 1.7–7.7)
Neutrophils Relative %: 79 %
Platelets: 160 10*3/uL (ref 150–400)
RBC: 3.83 MIL/uL — ABNORMAL LOW (ref 4.22–5.81)
RDW: 13.6 % (ref 11.5–15.5)
WBC: 13.3 10*3/uL — ABNORMAL HIGH (ref 4.0–10.5)
nRBC: 0 % (ref 0.0–0.2)

## 2023-02-16 LAB — COMPREHENSIVE METABOLIC PANEL
ALT: 29 U/L (ref 0–44)
AST: 26 U/L (ref 15–41)
Albumin: 2.6 g/dL — ABNORMAL LOW (ref 3.5–5.0)
Alkaline Phosphatase: 42 U/L (ref 38–126)
Anion gap: 8 (ref 5–15)
BUN: 14 mg/dL (ref 6–20)
CO2: 26 mmol/L (ref 22–32)
Calcium: 7.9 mg/dL — ABNORMAL LOW (ref 8.9–10.3)
Chloride: 101 mmol/L (ref 98–111)
Creatinine, Ser: 1.02 mg/dL (ref 0.61–1.24)
GFR, Estimated: >60 mL/min (ref >60)
Glucose, Bld: 103 mg/dL — ABNORMAL HIGH (ref 70–99)
Potassium: 3.6 mmol/L (ref 3.5–5.1)
Sodium: 135 mmol/L (ref 135–145)
Total Bilirubin: 1.4 mg/dL — ABNORMAL HIGH (ref <1.2)
Total Protein: 5.3 g/dL — ABNORMAL LOW (ref 6.5–8.1)

## 2023-02-16 LAB — CBC
HCT: 33.7 % — ABNORMAL LOW (ref 39.0–52.0)
Hemoglobin: 11.7 g/dL — ABNORMAL LOW (ref 13.0–17.0)
MCH: 34.3 pg — ABNORMAL HIGH (ref 26.0–34.0)
MCHC: 34.7 g/dL (ref 30.0–36.0)
MCV: 98.8 fL (ref 80.0–100.0)
Platelets: 153 10*3/uL (ref 150–400)
RBC: 3.41 MIL/uL — ABNORMAL LOW (ref 4.22–5.81)
RDW: 13.5 % (ref 11.5–15.5)
WBC: 9.4 10*3/uL (ref 4.0–10.5)
nRBC: 0 % (ref 0.0–0.2)

## 2023-02-16 LAB — HEPARIN LEVEL (UNFRACTIONATED)
Heparin Unfractionated: 0.16 [IU]/mL — ABNORMAL LOW (ref 0.30–0.70)
Heparin Unfractionated: 0.16 [IU]/mL — ABNORMAL LOW (ref 0.30–0.70)
Heparin Unfractionated: 0.16 [IU]/mL — ABNORMAL LOW (ref 0.30–0.70)

## 2023-02-16 LAB — MAGNESIUM: Magnesium: 2.4 mg/dL (ref 1.7–2.4)

## 2023-02-16 MED ORDER — COLCHICINE 0.6 MG PO TABS
0.6000 mg | ORAL_TABLET | Freq: Every day | ORAL | Status: DC
  Administered 2023-02-16 – 2023-02-22 (×7): 0.6 mg via ORAL
  Filled 2023-02-16 (×10): qty 1

## 2023-02-16 MED ORDER — SPIRONOLACTONE 25 MG PO TABS
25.0000 mg | ORAL_TABLET | Freq: Every day | ORAL | Status: DC
Start: 2023-02-16 — End: 2023-02-26
  Administered 2023-02-16 – 2023-02-22 (×7): 25 mg via ORAL
  Filled 2023-02-16 (×10): qty 1

## 2023-02-16 MED ORDER — LACTATED RINGERS IV SOLN: INTRAVENOUS | Status: AC

## 2023-02-16 MED ORDER — HYDROMORPHONE HCL 1 MG/ML IJ SOLN
0.5000 mg | INTRAMUSCULAR | Status: DC | PRN
  Administered 2023-02-16 – 2023-02-17 (×3): 0.5 mg via INTRAVENOUS
  Filled 2023-02-16 (×3): qty 0.5

## 2023-02-16 MED ORDER — DULOXETINE HCL 60 MG PO CPEP
60.0000 mg | ORAL_CAPSULE | Freq: Every day | ORAL | Status: DC
  Administered 2023-02-16 – 2023-02-22 (×7): 60 mg via ORAL
  Filled 2023-02-16 (×10): qty 1

## 2023-02-16 NOTE — Progress Notes (Signed)
Nurse provided a cup of water to patient and instructed pt that this will be his only cup of water he can have to sip on until 7pm; pt said ok; nurse checked on him 1 hour later and pt drink the entire cup of water; nurse reeducated pt about what the MD ordered; pt denies nausea, vomiting, and no new abdominal discomfort

## 2023-02-16 NOTE — Progress Notes (Signed)
[facility]  Overall improving. Moved to floor. Vitals all improving. H&H is drifting but no signs of bleeding, I think this is dilution + equilibration. Says he had flatus and a "shart"  BP 114/75 (BP Location: Right Arm)   Pulse (!) 101   Temp 98.3 F (36.8 C) (Oral)   Resp 18   Ht 6\' 2"  (1.88 m)   Wt 95.1 kg   SpO2 97%   BMI 26.92 kg/m  Soft, mildly distended, JP with SS output, midline with honeycomb, old staining, no erythema at staple line   Patient s/p Ex lap for pneumoperitoneum, unknown source, drain placement X 2.  Scheduled tylenol and can hold NG suction PRN narcotics CIWA IS, OOB ordered HR improving  NPO, NG in place, can have some oral meds and hold suction If NG out again can keep out Encouraged him to let RN know if he has BM to document  Can have a small cup of water to sip on each shift JP X 2 Labs tomorrow No signs of bleeding, drifting H&H from dilution and equlibration Heparin gtt for PE and erythrocytosis history  Zosyn for intraabdominal purulence, E coli on culture SCDs   Appreciate Dr. Laural Benes. Outpatient GI EGD/Colonoscopy in 6-8 weeks. CT scan after bowel function returns and before starting diet  Called and updated wife.  Algis Greenhouse, MD [facility] [address] [place], [place] [address] [phone] (office)

## 2023-02-16 NOTE — Consult Note (Signed)
.  Pharmacy Consult Note - Anticoagulation  Pharmacy Consult for Heparin Indication:  history of pulmonary embolism  PATIENT MEASUREMENTS: Height: 6\' 2"  (188 cm) Weight: 95.1 kg (209 lb 10.5 oz) IBW/kg (Calculated) : 82.2 HEPARIN DW (KG): 95.8  VITAL SIGNS: Temp: 98.2 F (36.8 C) (12/26 1746) Temp Source: Oral (12/26 1746) BP: 129/92 (12/26 1746) Pulse Rate: 97 (12/26 1746)  Recent Labs    02/14/23 1206 02/15/23 0513 02/15/23 1729 02/16/23 0039 02/16/23 0929 02/16/23 1221 02/16/23 1649  HGB 18.5*   < >  --  12.7*  --  11.7*  --   HCT 52.0   < >  --  37.5*  --  33.7*  --   PLT 265   < >  --  160  --  153  --   APTT  --   --  36  --   --   --   --   LABPROT  --   --  18.4*  --   --   --   --   INR  --   --  1.5*  --   --   --   --   HEPARINUNFRC  --    < > <0.10* 0.16*   < >  --  0.16*  CREATININE 1.04   < >  --  1.02  --   --   --   TROPONINIHS 4  --   --   --   --   --   --    < > = values in this interval not displayed.    Estimated Creatinine Clearance: 104.1 mL/min (by C-G formula based on SCr of 1.02 mg/dL).  PAST MEDICAL HISTORY: Past Medical History:  Diagnosis Date   Asthma    AS A CHILD ONLY   DVT (deep venous thrombosis) (HCC)    Foot pain, right    painful x 1 month and swelling   Idiopathic gout     ASSESSMENT: 47 y.o. male with PMH including pulmonary embolism in 2023 is presenting with a bowel perforation, and is now s/p exploratory laparotomy. Patient is on chronic anticoagulation with Eliquis. Last dose was 02/13/2023 in the evening. Patient received Kcentra yesterday (12/24) due to need for emergent procedure.  Pharmacy has been consulted to initiate and manage heparin intravenous infusion.  HL 0.16-subtherapeutic CBC WNL  Goal(s) of therapy: Heparin level 0.3-0.7 units/mL Monitor platelets by anticoagulation protocol: Yes   PLAN: No boluses (recent surgery) Increase heparin to 2400 units/hr Recheck Heparin level in 6 hours   Clovia Cuff, PharmD, BCPS 02/16/2023 5:54 PM

## 2023-02-16 NOTE — Progress Notes (Addendum)
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  He presented from home by EMS complaining of severe generalized abdominal pain with nausea vomiting and diarrhea that started early this morning.  The pain is 10/10.  He feels very ill and having palpitations.  He denies fever and chills.  In the ED he was noted to have a rigid abdomen and sent for CT abdomen pelvis with findings of concern for a perforated abdominal viscus.  Emergent surgical consultation requested.  He is started on IV antibiotics and IV fluids and admission requested for further management.   Assessment and Plan:  Sepsis from intraabdominal infection  - pt emergently taken to OR on 12/24 by Dr. Henreitta Leber for exploratory lap - sepsis physiology resolving and markedly improved  - zosyn per pharmacy dosing - IV fluid bolus ordered per surgery - lactic acidosis resolved now    Perforated abdominal viscous with pneumoperitoneum - Dr. Henreitta Leber took him to OR emergently after Kcentra treatment on 12/24  - surgery planning for CT abdomen/pelvis with oral contrast when bowel function returns - continue supportive measures - he is tolerating small sips - NG came out yesterday but successfully replaced     History of PE/ Acquired thrombophilia - last dose of  apixaban was taken 12/23 at 6pm - K-centra was given 12/24   - IV heparin started no bolus 12/25 per surgery  - we are watching H/H closely   Leukocytosis - secondary to abdominal infection and surgery - following blood cultures x 2 : no growth to date  - unfortunately IV ceftriaxone had already been given prior to blood cultures - WBC is trending down with recovery   Hypomagnesemia - IV replacement ordered and repleted   Normocytic anemia  - recheck CBC at noon today  - continue daily morning CBC   Severe dehydration - he was treated with bolus and maintenance fluids    Hyperbilirubinemia - likely reactive  - trending down    Depression/GAD / alcoholism  - severe anxiety at this time - IV lorazepam ordered per CIWA protocol  - supplemental vitamins per CIWA protocol  - will try to give cymbalta thru NG tube    Generalized abdominal pain  - severe pain being treated with IV medications - IV hydromorphone ordered as needed    Rheumatoid arthritis  - he is immunocompromised - his rheumatologist has him on DMARDS   Tobacco user - nicotine patch ordered    Dilated cardiomyopathy Chronic HFimpEF - He is on GDMT which is currently on hold, resume when able to take p.o.  - Fortunately his recent TTE shows EF improved to 50-55%  - monitor strict intake output and weight  - he presented very dehydrated and has been hydrated  - careful with fluids and monitoring what is being given  - holding home entresto given soft BPs  Intake/Output Summary (Last 24 hours) at 02/16/2023 7253 Last data filed at 02/16/2023 6644 Gross per 24 hour  Intake 2346.67 ml  Output 2745 ml  Net -398.33 ml   Filed Weights   02/15/23 0757 02/16/23 0447 02/16/23 0648  Weight: 95.8 kg 89.6 kg 95.1 kg   Sinus tachycardia  - likely having beta blocker rebound tachycardia  - IV lopressor while NPO with hold parameters - resume home oral metoprolol XL 150 mg when able to take p.o. better  DVT  prophylaxis: SCDs  Code Status: Full  Family Communication: wife/mother Disposition: Status is: Inpatient   Consultants:  Surgeon Dr. Henreitta Leber   Procedures:  Exp lap, open lesser sac and greater sac, JP drain placement 02/14/23  Antimicrobials:  Zosyn 12/24>>   Subjective: Pt has been ambulating well in the room.  Occasionally having some shakes but ativan helps.    Objective: Vitals:   02/16/23 0648 02/16/23 0745 02/16/23 0800 02/16/23 0900  BP:   (!) 131/91 127/69  Pulse:   99 100  Resp:   15 15  Temp:  97.8 F (36.6 C)    TempSrc:  Oral    SpO2: 94%  98% 97%  Weight: 95.1 kg     Height:        Intake/Output Summary (Last 24 hours) at 02/16/2023 0938 Last data filed at 02/16/2023 0643 Gross per 24 hour  Intake 2346.67 ml  Output 2745 ml  Net -398.33 ml   Filed Weights   02/15/23 0757 02/16/23 0447 02/16/23 0648  Weight: 95.8 kg 89.6 kg 95.1 kg   Examination:  General exam: Appears calm and comfortable  Respiratory system: Clear to auscultation. Respiratory effort normal. Cardiovascular system: normal S1 & S2 heard. No JVD, murmurs, rubs, gallops or clicks. No pedal edema. Gastrointestinal system: Abdomen soft; wounds clean, dry, intact;  Central nervous system: Alert and oriented. No focal neurological deficits. Extremities: Symmetric 5 x 5 power. Skin: No rashes, lesions or ulcers. Psychiatry: Judgement and insight appear normal. Mood & affect appropriate.   Data Reviewed: I have personally reviewed following labs and imaging studies  CBC: Recent Labs  Lab 02/14/23 1206 02/15/23 0513 02/16/23 0039  WBC 13.0* 14.2* 13.3*  NEUTROABS  --  11.9* 10.5*  HGB 18.5* 15.1 12.7*  HCT 52.0 42.5 37.5*  MCV 95.6 96.8 97.9  PLT 265 179 160    Basic Metabolic Panel: Recent Labs  Lab 02/14/23 1206 02/15/23 0513 02/16/23 0039  NA 139 139 135  K 3.7 3.8 3.6  CL 101 107 101  CO2 24 24 26   GLUCOSE 167* 121* 103*  BUN 22* 19 14  CREATININE 1.04 0.82 1.02   CALCIUM 8.9 7.8* 7.9*  MG  --  1.6* 2.4  PHOS  --  4.3  --     CBG: No results for input(s): "GLUCAP" in the last 168 hours.  Recent Results (from the past 240 hours)  Resp panel by RT-PCR (RSV, Flu A&B, Covid) Anterior Nasal Swab     Status: None   Collection Time: 02/14/23  1:18 PM   Specimen: Anterior Nasal Swab  Result Value Ref Range Status   SARS Coronavirus 2 by RT PCR NEGATIVE NEGATIVE Final    Comment: (NOTE) SARS-CoV-2 target nucleic acids are NOT DETECTED.  The SARS-CoV-2 RNA is generally detectable in upper respiratory specimens during the acute phase of infection. The lowest concentration of SARS-CoV-2 viral copies this assay can detect is 138 copies/mL. A negative result does not preclude SARS-Cov-2 infection and  should not be used as the sole basis for treatment or other patient management decisions. A negative result may occur with  improper specimen collection/handling, submission of specimen other than nasopharyngeal swab, presence of viral mutation(s) within the areas targeted by this assay, and inadequate number of viral copies(<138 copies/mL). A negative result must be combined with clinical observations, patient history, and epidemiological information. The expected result is Negative.  Fact Sheet for Patients:  BloggerCourse.com  Fact Sheet for Healthcare Providers:  SeriousBroker.it  This test is no t yet approved or cleared by the [place] FDA and  has been authorized for detection and/or diagnosis of SARS-CoV-2 by FDA under an Emergency Use Authorization (EUA). This EUA will remain  in effect (meaning this test can be used) for the duration of the COVID-19 declaration under Section 564(b)(1) of the Act, 21 U.S.C.section 360bbb-3(b)(1), unless the authorization is terminated  or revoked sooner.       Influenza A by PCR NEGATIVE NEGATIVE Final   Influenza B by PCR NEGATIVE NEGATIVE Final     Comment: (NOTE) The Xpert Xpress SARS-CoV-2/FLU/RSV plus assay is intended as an aid in the diagnosis of influenza from Nasopharyngeal swab specimens and should not be used as a sole basis for treatment. Nasal washings and aspirates are unacceptable for Xpert Xpress SARS-CoV-2/FLU/RSV testing.  Fact Sheet for Patients: BloggerCourse.com  Fact Sheet for Healthcare Providers: SeriousBroker.it  This test is not yet approved or cleared by the [place] FDA and has been authorized for detection and/or diagnosis of SARS-CoV-2 by FDA under an Emergency Use Authorization (EUA). This EUA will remain in effect (meaning this test can be used) for the duration of the COVID-19 declaration under Section 564(b)(1) of the Act, 21 U.S.C. section 360bbb-3(b)(1), unless the authorization is terminated or revoked.     Resp Syncytial Virus by PCR NEGATIVE NEGATIVE Final    Comment: (NOTE) Fact Sheet for Patients: BloggerCourse.com  Fact Sheet for Healthcare Providers: SeriousBroker.it  This test is not yet approved or cleared by the [place] FDA and has been authorized for detection and/or diagnosis of SARS-CoV-2 by FDA under an Emergency Use Authorization (EUA). This EUA will remain in effect (meaning this test can be used) for the duration of the COVID-19 declaration under Section 564(b)(1) of the Act, 21 U.S.C. section 360bbb-3(b)(1), unless the authorization is terminated or revoked.  Performed at [facility], [address] [place], [place] [address]   Culture, blood (Routine X 2) w Reflex to ID Panel     Status: None (Preliminary result)   Collection Time: 02/14/23  2:20 PM   Specimen: Right Antecubital; Blood  Result Value Ref Range Status   Specimen Description RIGHT ANTECUBITAL  Final   Special Requests   Final    BOTTLES DRAWN AEROBIC AND ANAEROBIC Blood Culture adequate  volume   Culture   Final    NO GROWTH 2 DAYS Performed at [facility], [address] [place], [place] [address]    Report Status PENDING  Incomplete  Culture, blood (Routine X 2) w Reflex to ID Panel     Status: None (Preliminary result)   Collection Time: 02/14/23  2:56 PM   Specimen: BLOOD  Result Value Ref Range Status   Specimen Description BLOOD BLOOD RIGHT FOREARM  Final   Special Requests   Final    BOTTLES DRAWN AEROBIC AND ANAEROBIC Blood Culture adequate volume   Culture   Final    NO GROWTH 2 DAYS Performed at [facility],  [address], [place], [place] [address]    Report Status PENDING  Incomplete  Aerobic/Anaerobic Culture w Gram Stain (surgical/deep wound)     Status: None (Preliminary result)   Collection Time: 02/14/23  4:17 PM   Specimen: Path fluid; Body Fluid  Result Value Ref Range Status   Specimen Description WOUND  Final   Special Requests PURLANT ASCITES  Final   Gram Stain   Final    ABUNDANT WBC PRESENT, PREDOMINANTLY PMN RARE GRAM NEGATIVE RODS    Culture   Final    FEW ESCHERICHIA COLI CULTURE REINCUBATED FOR BETTER GROWTH Performed at [facility], [address] [place], [place] [address]    Report Status PENDING  Incomplete   Organism ID, Bacteria ESCHERICHIA COLI  Final      Susceptibility   Escherichia coli - MIC*    AMPICILLIN 4 SENSITIVE Sensitive     CEFEPIME <=0.12 SENSITIVE Sensitive     CEFTAZIDIME <=1 SENSITIVE Sensitive     CEFTRIAXONE <=0.25 SENSITIVE Sensitive     CIPROFLOXACIN <=0.25 SENSITIVE Sensitive     GENTAMICIN <=1 SENSITIVE Sensitive     IMIPENEM <=0.25 SENSITIVE Sensitive     TRIMETH/SULFA <=20 SENSITIVE Sensitive     AMPICILLIN/SULBACTAM <=2 SENSITIVE Sensitive     PIP/TAZO <=4 SENSITIVE Sensitive ug/mL    * FEW ESCHERICHIA COLI  MRSA Next Gen by PCR, Nasal     Status: None   Collection Time: 02/14/23  6:30 PM   Specimen: Nasal Mucosa; Nasal Swab  Result Value Ref Range Status   MRSA by PCR Next Gen  NOT DETECTED NOT DETECTED Final    Comment: (NOTE) The GeneXpert MRSA Assay (FDA approved for NASAL specimens only), is one component of a comprehensive MRSA colonization surveillance program. It is not intended to diagnose MRSA infection nor to guide or monitor treatment for MRSA infections. Test performance is not FDA approved in patients less than 43 years old. Performed at [facility], [address] [place], [place] [address]      Radiology Studies: DG CHEST PORT 1 VIEW Result Date: 02/15/2023 CLINICAL DATA:  Enteric tube placement EXAM: PORTABLE CHEST 1 VIEW COMPARISON:  Chest radiograph dated 02/14/2023 FINDINGS: Gastric/enteric tube tip projects over the stomach. Drainage catheter is seen projecting over the left upper quadrant. Low lung volumes with bronchovascular crowding. Bibasilar patchy opacities. Enlarged cardiomediastinal silhouette is likely projectional. No definite pneumothorax or pleural effusion. IMPRESSION: 1. Gastric/enteric tube tip projects over the stomach. 2. Low lung volumes with bronchovascular crowding. Bibasilar patchy opacities, likely atelectasis. Electronically Signed   By: Agustin Cree M.D.   On: 02/15/2023 20:06   DG Chest Port 1 View Result Date: 02/14/2023 CLINICAL DATA:  NG tube placement EXAM: PORTABLE CHEST 1 VIEW COMPARISON:  02/14/2023 FINDINGS: NG tube tip is in the fundus of the stomach. Low lung volumes. Right base atelectasis. Left lung clear. Heart is normal size. No effusions. IMPRESSION: NG tube tip in the stomach. Low lung volumes.  Right base atelectasis. Electronically Signed   By: Charlett Nose M.D.   On: 02/14/2023 19:17   DG Chest Port 1 View Result Date: 02/14/2023 CLINICAL DATA:  Shortness of breath. EXAM: PORTABLE CHEST 1 VIEW COMPARISON:  02/16/2020. FINDINGS: Low lung volume. Mild pulmonary vascular congestion, which may be accentuated by low lung volume. No frank pulmonary edema. Bilateral lung fields are otherwise clear. No acute  consolidation or lung collapse. Bilateral costophrenic angles are clear. Normal cardio-mediastinal silhouette. No acute osseous abnormalities. The soft  tissues are within normal limits. IMPRESSION: *Mild diffuse pulmonary vascular congestion. Otherwise, no acute cardiopulmonary abnormality. Electronically Signed   By: Jules Schick M.D.   On: 02/14/2023 13:26   CT ABDOMEN PELVIS W CONTRAST Result Date: 02/14/2023 CLINICAL DATA:  Acute generalized abdominal pain. EXAM: CT ABDOMEN AND PELVIS WITH CONTRAST TECHNIQUE: Multidetector CT imaging of the abdomen and pelvis was performed using the standard protocol following bolus administration of intravenous contrast. RADIATION DOSE REDUCTION: This exam was performed according to the departmental dose-optimization program which includes automated exposure control, adjustment of the mA and/or kV according to patient size and/or use of iterative reconstruction technique. CONTRAST:  OMNIPAQUE IOHEXOL 300 MG/ML  SOLN COMPARISON:  February 11, 2020. FINDINGS: Lower chest: No acute abnormality. Hepatobiliary: No focal liver abnormality is seen. No gallstones, gallbladder wall thickening, or biliary dilatation. Pancreas: Unremarkable. No pancreatic ductal dilatation or surrounding inflammatory changes. Spleen: Normal in size without focal abnormality. Adrenals/Urinary Tract: Adrenal glands appear normal. Stable left renal cyst is noted for which no further follow-up is required. No hydronephrosis or renal obstruction is noted. Urinary bladder is unremarkable. Stomach/Bowel: The stomach is unremarkable. The appendix appears normal. Mild amount of free air is noted in the epigastric region and in the pelvis consistent with rupture of hollow viscus. Mildly dilated and thick walled small bowel loops are noted in the pelvis and lower abdomen suggesting enteritis. There also appears to be mild sigmoid wall thickening suggesting possible diverticulitis. There is noted air  between a small bowel loop and sigmoid colon, and therefore the source of perforation is not clearly identifiable. Vascular/Lymphatic: No significant vascular findings are present. No enlarged abdominal or pelvic lymph nodes. Reproductive: Prostate is unremarkable. Other: No ascites or hernia is noted. Musculoskeletal: No acute or significant osseous findings. IMPRESSION: Pneumoperitoneum is noted both in the epigastric and pelvic regions concerning for rupture of hollow viscus. Mildly dilated and inflamed small bowel loops are noted in lower abdomen and pelvis as well as mild sigmoid wall thickening suggesting possible inflammation there is well. It is uncertain if the pneumoperitoneum is secondary to ruptured small or large bowel. Critical Value/emergent results were called by telephone at the time of interpretation on 02/14/2023 at 1:22 pm to provider JOSEPH ZAMMIT , who verbally acknowledged these results. Electronically Signed   By: Lupita Raider M.D.   On: 02/14/2023 13:24   Scheduled Meds:  acetaminophen  1,000 mg Oral Q6H   Chlorhexidine Gluconate Cloth  6 each Topical Daily   folic acid  1 mg Intravenous Daily   metoprolol tartrate  2.5 mg Intravenous Q6H   multivitamin with minerals  1 tablet Oral Daily   nicotine  14 mg Transdermal q1600   pantoprazole (PROTONIX) IV  40 mg Intravenous Q12H   thiamine  100 mg Oral Daily   Or   thiamine  100 mg Intravenous Daily   Continuous Infusions:  heparin 1,800 Units/hr (02/16/23 0928)   lactated ringers 125 mL/hr at 02/15/23 1840   piperacillin-tazobactam (ZOSYN)  IV 3.375 g (02/16/23 0552)     LOS: 2 days   Time spent: 58 mins  Ladeana Laplant Laural Benes, MD How to contact the [facility] Attending or Consulting provider 7A - 7P or covering provider during after hours 7P -7A, for this patient?  Check the care team in [facility] and look for a) attending/consulting [facility] provider listed and b) the [facility] team listed Log into [email or web address] to find provider on call.   Locate the [facility] provider you are looking for  under [facility] and page to a number that you can be directly reached. If you still have difficulty reaching the provider, please page the [facility] (Director on Call) for the Hospitalists listed on amion for assistance.  02/16/2023, 9:38 AM

## 2023-02-16 NOTE — Consult Note (Signed)
.  Pharmacy Consult Note - Anticoagulation  Pharmacy Consult for Heparin Indication:  history of pulmonary embolism  PATIENT MEASUREMENTS: Height: 6\' 2"  (188 cm) Weight: 95.8 kg (211 lb 3.2 oz) IBW/kg (Calculated) : 82.2 HEPARIN DW (KG): 95.8  VITAL SIGNS: Temp: 98.2 F (36.8 C) (12/25 2337) Temp Source: Oral (12/25 2337) BP: 116/71 (12/26 0100) Pulse Rate: 107 (12/26 0100)  Recent Labs    02/14/23 1206 02/15/23 0513 02/15/23 1729 02/16/23 0039  HGB 18.5*   < >  --  12.7*  HCT 52.0   < >  --  37.5*  PLT 265   < >  --  160  APTT  --   --  36  --   LABPROT  --   --  18.4*  --   INR  --   --  1.5*  --   HEPARINUNFRC  --    < > <0.10* 0.16*  CREATININE 1.04   < >  --  1.02  TROPONINIHS 4  --   --   --    < > = values in this interval not displayed.    Estimated Creatinine Clearance: 104.1 mL/min (by C-G formula based on SCr of 1.02 mg/dL).  PAST MEDICAL HISTORY: Past Medical History:  Diagnosis Date   Asthma    AS A CHILD ONLY   DVT (deep venous thrombosis) (HCC)    Foot pain, right    painful x 1 month and swelling   Idiopathic gout     ASSESSMENT: 47 y.o. male with PMH including pulmonary embolism in 2023 is presenting with a bowel perforation, and is now s/p exploratory laparotomy. Patient is on chronic anticoagulation with Eliquis. Last dose was 02/13/2023 in the evening. Patient received Kcentra yesterday (12/24) due to need for emergent procedure. I am uncertain of Kcentra's impact on antiXa levels in terms of potential false elevation, so will order both aPTT and antiXa level, as well as checking baseline antiXa level. Pharmacy has been consulted to initiate and manage heparin intravenous infusion.  12/26 AM update:  Heparin level sub-therapeutic   Goal(s) of therapy: Heparin level 0.3-0.7 units/mL Monitor platelets by anticoagulation protocol: Yes   PLAN: No boluses (recent surgery) Inc heparin to 1800 units/hr Heparin level in 8 hours   Abran Duke, PharmD, BCPS Clinical Pharmacist Phone: [phone]

## 2023-02-16 NOTE — TOC Initial Note (Signed)
Transition of Care [facility]) - Initial/Assessment Note    Patient Details  Name: Ryan Gibson MRN: [id number] Date of Birth: [birth year]  Transition of [facility]) CM/SW Contact:    Elliot Gault, LCSW Phone Number: 02/16/2023, 10:39 AM  Clinical Narrative:                  Pt now considered high risk for readmission. He is from home and will return home at dc. Pt independent in ADLs. He is able to get to appointments and obtain medications as needed.   TOC will follow and assist as needed.  Expected Discharge Plan: Home/Self Care Barriers to Discharge: Continued Medical Work up   Patient Goals and CMS Choice Patient states their goals for this hospitalization and ongoing recovery are:: return home          Expected Discharge Plan and Services In-house Referral: Clinical Social Work                                            Prior Living Arrangements/Services   Lives with:: Spouse Patient language and need for interpreter reviewed:: Yes Do you feel safe going back to the place where you live?: Yes      Need for Family Participation in Patient Care: No (Comment)     Criminal Activity/Legal Involvement Pertinent to Current Situation/Hospitalization: No - Comment as needed  Activities of Daily Living      Permission Sought/Granted                  Emotional Assessment Appearance:: Appears stated age     Orientation: : Oriented to Self, Oriented to Place, Oriented to  Time, Oriented to Situation Alcohol / Substance Use: Alcohol Use Psych Involvement: No (comment)  Admission diagnosis:  Perforated viscus [R19.8] Perforation of viscus [R19.8] Patient Active Problem List   Diagnosis Date Noted   Perforation of viscus 02/14/2023   Sinus tachycardia 02/14/2023   Acquired thrombophilia (HCC) 02/14/2023   Severe dehydration 02/14/2023   Generalized abdominal pain 02/14/2023   Leukocytosis 02/14/2023   Hyperbilirubinemia 02/14/2023    Severe sepsis (HCC) 02/14/2023   Erythrocytosis 02/07/2023   High risk medication use 12/05/2022   Screening for tuberculosis 12/05/2022   Anxiety 11/08/2022   Seronegative rheumatoid arthritis (HCC) 11/08/2022   History of pulmonary embolism 11/17/2021   Chronic systolic heart failure (HCC) 11/17/2021   Hyperlipidemia 06/16/2021   Dilated cardiomyopathy (HCC) 12/16/2020   Family history of early CAD 12/16/2020   Hypogonadism male 11/24/2015   Gout 11/01/2014   Essential hypertension 03/10/2014   PCP:  Tommie Sams, DO Pharmacy:   [facility] [phone] - [place], [place] - [address] AT [facility] [address] [place] [address] Phone: [phone] Fax: [phone]     Social Drivers of Health (SDOH) Social History: SDOH Screenings   Food Insecurity: No Food Insecurity (02/15/2023)  Housing: Low Risk  (02/15/2023)  Transportation Needs: No Transportation Needs (02/15/2023)  Utilities: Not At Risk (02/15/2023)  Alcohol Screen: Low Risk  (02/07/2023)  Depression (PHQ2-9): Medium Risk (01/11/2023)  Financial Resource Strain: Medium Risk (02/07/2023)  Physical Activity: Insufficiently Active (02/07/2023)  Social Connections: Socially Integrated (02/07/2023)  Stress: No Stress Concern Present (02/07/2023)  Tobacco Use: High Risk (02/14/2023)   SDOH Interventions:     Readmission Risk Interventions    02/16/2023   10:38 AM  Readmission Risk  Prevention Plan  Transportation Screening Complete  HRI or Home Care Consult Complete  Social Work Consult for Recovery Care Planning/Counseling Complete  Palliative Care Screening Not Applicable  Medication Review Oceanographer) Complete

## 2023-02-16 NOTE — Progress Notes (Signed)
The patient ambulated in his room with the aid of a front wheel walker.  He was able to do two laps around his room, which was about 30 ft.  He tolerated this fairly, but he still reported pain that he was able to utilize breathing to compensate for the pain while ambulating.  He need encouragement to ambulate more throughout the day.  He returned to bed with VSS and no acute s/s of distress noted.

## 2023-02-16 NOTE — Consult Note (Addendum)
.  Pharmacy Consult Note - Anticoagulation  Pharmacy Consult for Heparin Indication:  history of pulmonary embolism  PATIENT MEASUREMENTS: Height: 6\' 2"  (188 cm) Weight: 95.1 kg (209 lb 10.5 oz) IBW/kg (Calculated) : 82.2 HEPARIN DW (KG): 95.8  VITAL SIGNS: Temp: 97.8 F (36.6 C) (12/26 0745) Temp Source: Oral (12/26 0745) BP: 127/69 (12/26 0900) Pulse Rate: 100 (12/26 0900)  Recent Labs    02/14/23 1206 02/15/23 0513 02/15/23 1729 02/16/23 0039 02/16/23 0929  HGB 18.5*   < >  --  12.7*  --   HCT 52.0   < >  --  37.5*  --   PLT 265   < >  --  160  --   APTT  --   --  36  --   --   LABPROT  --   --  18.4*  --   --   INR  --   --  1.5*  --   --   HEPARINUNFRC  --    < > <0.10* 0.16* 0.16*  CREATININE 1.04   < >  --  1.02  --   TROPONINIHS 4  --   --   --   --    < > = values in this interval not displayed.    Estimated Creatinine Clearance: 104.1 mL/min (by C-G formula based on SCr of 1.02 mg/dL).  PAST MEDICAL HISTORY: Past Medical History:  Diagnosis Date   Asthma    AS A CHILD ONLY   DVT (deep venous thrombosis) (HCC)    Foot pain, right    painful x 1 month and swelling   Idiopathic gout     ASSESSMENT: 47 y.o. male with PMH including pulmonary embolism in 2023 is presenting with a bowel perforation, and is now s/p exploratory laparotomy. Patient is on chronic anticoagulation with Eliquis. Last dose was 02/13/2023 in the evening. Patient received Kcentra yesterday (12/24) due to need for emergent procedure.  Pharmacy has been consulted to initiate and manage heparin intravenous infusion.  HL 0.16-subetherapeutic CBC WNL  Goal(s) of therapy: Heparin level 0.3-0.7 units/mL Monitor platelets by anticoagulation protocol: Yes   PLAN: No boluses (recent surgery) Increase heparin to 2100 units/hr Heparin level in 6-8 hours   Judeth Cornfield, PharmD Clinical Pharmacist 02/16/2023 10:08 AM

## 2023-02-16 NOTE — Plan of Care (Signed)

## 2023-02-16 NOTE — Progress Notes (Signed)
Pt pull out his NG tube

## 2023-02-17 ENCOUNTER — Inpatient Hospital Stay (HOSPITAL_COMMUNITY): Payer: BC Managed Care – PPO

## 2023-02-17 DIAGNOSIS — I5022 Chronic systolic (congestive) heart failure: Secondary | ICD-10-CM | POA: Diagnosis not present

## 2023-02-17 DIAGNOSIS — R140 Abdominal distension (gaseous): Secondary | ICD-10-CM | POA: Diagnosis not present

## 2023-02-17 DIAGNOSIS — J9811 Atelectasis: Secondary | ICD-10-CM | POA: Diagnosis not present

## 2023-02-17 DIAGNOSIS — Z4682 Encounter for fitting and adjustment of non-vascular catheter: Secondary | ICD-10-CM | POA: Diagnosis not present

## 2023-02-17 DIAGNOSIS — R9389 Abnormal findings on diagnostic imaging of other specified body structures: Secondary | ICD-10-CM | POA: Diagnosis not present

## 2023-02-17 DIAGNOSIS — D6869 Other thrombophilia: Secondary | ICD-10-CM | POA: Diagnosis not present

## 2023-02-17 DIAGNOSIS — A419 Sepsis, unspecified organism: Secondary | ICD-10-CM | POA: Diagnosis not present

## 2023-02-17 DIAGNOSIS — R198 Other specified symptoms and signs involving the digestive system and abdomen: Secondary | ICD-10-CM | POA: Diagnosis not present

## 2023-02-17 LAB — COMPREHENSIVE METABOLIC PANEL
ALT: 26 U/L (ref 0–44)
AST: 21 U/L (ref 15–41)
Albumin: 2.6 g/dL — ABNORMAL LOW (ref 3.5–5.0)
Alkaline Phosphatase: 40 U/L (ref 38–126)
Anion gap: 10 (ref 5–15)
BUN: 10 mg/dL (ref 6–20)
CO2: 24 mmol/L (ref 22–32)
Calcium: 8.2 mg/dL — ABNORMAL LOW (ref 8.9–10.3)
Chloride: 102 mmol/L (ref 98–111)
Creatinine, Ser: 0.77 mg/dL (ref 0.61–1.24)
GFR, Estimated: >60 mL/min (ref >60)
Glucose, Bld: 86 mg/dL (ref 70–99)
Potassium: 3.2 mmol/L — ABNORMAL LOW (ref 3.5–5.1)
Sodium: 136 mmol/L (ref 135–145)
Total Bilirubin: 1.7 mg/dL — ABNORMAL HIGH (ref <1.2)
Total Protein: 5.3 g/dL — ABNORMAL LOW (ref 6.5–8.1)

## 2023-02-17 LAB — CBC WITH DIFFERENTIAL/PLATELET
Abs Immature Granulocytes: 0.05 10*3/uL (ref 0.00–0.07)
Basophils Absolute: 0 10*3/uL (ref 0.0–0.1)
Basophils Relative: 0 %
Eosinophils Absolute: 0.4 10*3/uL (ref 0.0–0.5)
Eosinophils Relative: 4 %
HCT: 33.5 % — ABNORMAL LOW (ref 39.0–52.0)
Hemoglobin: 11.4 g/dL — ABNORMAL LOW (ref 13.0–17.0)
Immature Granulocytes: 1 %
Lymphocytes Relative: 15 %
Lymphs Abs: 1.3 10*3/uL (ref 0.7–4.0)
MCH: 33.3 pg (ref 26.0–34.0)
MCHC: 34 g/dL (ref 30.0–36.0)
MCV: 98 fL (ref 80.0–100.0)
Monocytes Absolute: 0.4 10*3/uL (ref 0.1–1.0)
Monocytes Relative: 4 %
Neutro Abs: 6.9 10*3/uL (ref 1.7–7.7)
Neutrophils Relative %: 76 %
Platelets: 157 10*3/uL (ref 150–400)
RBC: 3.42 MIL/uL — ABNORMAL LOW (ref 4.22–5.81)
RDW: 13.1 % (ref 11.5–15.5)
WBC: 9 10*3/uL (ref 4.0–10.5)
nRBC: 0 % (ref 0.0–0.2)

## 2023-02-17 LAB — AEROBIC/ANAEROBIC CULTURE W GRAM STAIN (SURGICAL/DEEP WOUND)

## 2023-02-17 LAB — HEPARIN LEVEL (UNFRACTIONATED)
Heparin Unfractionated: 0.17 [IU]/mL — ABNORMAL LOW (ref 0.30–0.70)
Heparin Unfractionated: 0.36 [IU]/mL (ref 0.30–0.70)
Heparin Unfractionated: 0.48 [IU]/mL (ref 0.30–0.70)

## 2023-02-17 MED ORDER — PHENOL 1.4 % MT LIQD
1.0000 | OROMUCOSAL | Status: DC | PRN
  Administered 2023-02-17 – 2023-02-18 (×2): 1 via OROMUCOSAL
  Filled 2023-02-17: qty 177

## 2023-02-17 MED ORDER — METHOCARBAMOL 1000 MG/10ML IJ SOLN
500.0000 mg | Freq: Four times a day (QID) | INTRAMUSCULAR | Status: DC | PRN
  Administered 2023-02-17 – 2023-02-26 (×12): 500 mg via INTRAVENOUS
  Filled 2023-02-17 (×12): qty 10

## 2023-02-17 MED ORDER — LACTATED RINGERS IV SOLN: INTRAVENOUS | Status: AC

## 2023-02-17 MED ORDER — ACETAMINOPHEN 10 MG/ML IV SOLN
1000.0000 mg | Freq: Once | INTRAVENOUS | Status: AC
  Administered 2023-02-17: 1000 mg via INTRAVENOUS
  Filled 2023-02-17: qty 100

## 2023-02-17 MED ORDER — HYDROMORPHONE HCL 1 MG/ML IJ SOLN
0.5000 mg | INTRAMUSCULAR | Status: DC | PRN
  Administered 2023-02-17 – 2023-02-20 (×23): 1 mg via INTRAVENOUS
  Filled 2023-02-17 (×24): qty 1

## 2023-02-17 MED ORDER — POTASSIUM CHLORIDE 10 MEQ/100ML IV SOLN
10.0000 meq | INTRAVENOUS | Status: AC
  Administered 2023-02-17 (×4): 10 meq via INTRAVENOUS
  Filled 2023-02-17 (×4): qty 100

## 2023-02-17 NOTE — Consult Note (Signed)
.  Pharmacy Consult Note - Anticoagulation  Pharmacy Consult for Heparin Indication:  history of pulmonary embolism  PATIENT MEASUREMENTS: Height: 6\' 2"  (188 cm) Weight: 111.7 kg (246 lb 4.1 oz) IBW/kg (Calculated) : 82.2 HEPARIN DW (KG): 95.8  VITAL SIGNS: Temp: 98.6 F (37 C) (12/27 1525) Temp Source: Oral (12/27 1355) BP: 146/104 (12/27 1702) Pulse Rate: 101 (12/27 1702)  Recent Labs    02/15/23 1729 02/16/23 0039 02/17/23 0031 02/17/23 1009 02/17/23 1646  HGB  --    < > 11.4*  --   --   HCT  --    < > 33.5*  --   --   PLT  --    < > 157  --   --   APTT 36  --   --   --   --   LABPROT 18.4*  --   --   --   --   INR 1.5*  --   --   --   --   HEPARINUNFRC <0.10*   < > 0.17*   < > 0.48  CREATININE  --    < > 0.77  --   --    < > = values in this interval not displayed.    Estimated Creatinine Clearance: 151.8 mL/min (by C-G formula based on SCr of 0.77 mg/dL).  PAST MEDICAL HISTORY: Past Medical History:  Diagnosis Date   Asthma    AS A CHILD ONLY   DVT (deep venous thrombosis) (HCC)    Foot pain, right    painful x 1 month and swelling   Idiopathic gout     ASSESSMENT: 47 y.o. male with PMH including pulmonary embolism in 2023 is presenting with a bowel perforation, and is now s/p exploratory laparotomy. Patient is on chronic anticoagulation with Eliquis. Last dose was 02/13/2023 in the evening. Patient received Kcentra yesterday (12/24) due to need for emergent procedure. I am uncertain of Kcentra's impact on antiXa levels in terms of potential false elevation, so will order both aPTT and antiXa level, as well as checking baseline antiXa level. Pharmacy has been consulted to initiate and manage heparin intravenous infusion.  HL 0.48- therapeutic    Goal(s) of therapy: Heparin level 0.3-0.7 units/mL Monitor platelets by anticoagulation protocol: Yes   PLAN: Continue heparin infusion at 2700 units/hr Heparin level daily Continue to monitor H&H and  platelets.   Judeth Cornfield, PharmD Clinical Pharmacist 02/17/2023 5:15 PM

## 2023-02-17 NOTE — Progress Notes (Signed)
Pt slept well during the night, patient did need pain medications throughout the night after ambulating in room and ambulating to bathroom, explained to patient ambulating and moving around is vital to the healing process. Pt expressed concern about stomach becoming tighter and educated pt on strict I&O's and the importance of not drinking fluids at this time except with meds. Pt received a cup of ice last night to chew on for the dry mouth but no additional fluids PO.

## 2023-02-17 NOTE — Plan of Care (Signed)
  Problem: Education: Goal: Knowledge of General Education information will improve Description: Including pain rating scale, medication(s)/side effects and non-pharmacologic comfort measures Outcome: Progressing   Problem: Health Behavior/Discharge Planning: Goal: Ability to manage health-related needs will improve Outcome: Progressing   Problem: Clinical Measurements: Goal: Ability to maintain clinical measurements within normal limits will improve Outcome: Progressing Goal: Will remain free from infection Outcome: Progressing Goal: Diagnostic test results will improve Outcome: Progressing Goal: Respiratory complications will improve Outcome: Progressing   Problem: Activity: Goal: Risk for activity intolerance will decrease Outcome: Progressing   Problem: Coping: Goal: Level of anxiety will decrease Outcome: Progressing   Problem: Elimination: Goal: Will not experience complications related to bowel motility Outcome: Progressing   Problem: Pain Management: Goal: General experience of comfort will improve Outcome: Progressing   Problem: Safety: Goal: Ability to remain free from injury will improve Outcome: Progressing   Problem: Skin Integrity: Goal: Risk for impaired skin integrity will decrease Outcome: Progressing   Problem: Safety: Goal: Non-violent Restraint(s) Outcome: Progressing

## 2023-02-17 NOTE — Progress Notes (Signed)
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  He presented from home by EMS complaining of severe generalized abdominal pain with nausea vomiting and diarrhea that started early this morning.  The pain is 10/10.  He feels very ill and having palpitations.  He denies fever and chills.  In the ED he was noted to have a rigid abdomen and sent for CT abdomen pelvis with findings of concern for a perforated abdominal viscus.  Emergent surgical consultation requested.  He is started on IV antibiotics and IV fluids and admission requested for further management. The patient underwent exploratory laparotomy for his pneumoperitoneum on 02/14/2023.  A JP drain was placed over the stomach and into the pelvis.  General surgery continue to follow-up for postoperative care.  His hospitalization has been prolonged secondary to patient removal of his NG tube and postoperative ileus. NG tube was replaced on 02/17/2023 putting out 1500 cc.   Assessment/Plan: Sepsis from intraabdominal infection/pneumoperitoneum - pt emergently taken to OR on 12/24 by Dr. Henreitta Leber for exploratory lap - sepsis physiology resolving and markedly improved  -Continue Zosyn -Continue IV fluids -Lactic acid 2.9>> 1.4 - lactic acidosis resolved now    Perforated abdominal viscous with pneumoperitoneum/postoperative ileus - Dr. Henreitta Leber took him to OR emergently after  Kcentra treatment on 12/24  - surgery planning for CT abdomen/pelvis with oral contrast when bowel function returns - continue supportive measures - NG came out x2 -NG tube replaced 02/17/2023 with 1500 cc output -Changes in her medications to IV   History of PE/ Acquired thrombophilia -Patient diagnosed with PE and right lower extremity DVT 01/2020 -Felt to be secondary to underlying COVID-19 infection - last dose of apixaban was taken 12/23 at 6pm - K-centra was given 12/24   - IV heparin started no bolus 12/25 per surgery  -Monitor hemoglobin  Chronic HFrEF -02/05/2023 echo EF 55%, no WMA, normal RVF, grade 1 DD - 02/16/2020 echo EF < 20 % -Initially felt to be secondary to alcohol abuse -Entresto on hold until patient able to tolerate p.o. -Holding Jardiance secondary to acute infectious process -Metoprolol succinate transition to IV Lopressor until able to tolerate p.o. -Restart spironolactone when able to tolerate p.o. -Appears clinically euvolemic   Leukemoid reaction - secondary to abdominal infection and surgery - following blood cultures x 2 : no growth to date  - unfortunately IV ceftriaxone had already been given prior to blood cultures - WBC is trending down with recovery    Hypomagnesemia - IV replacement ordered and repleted   Depression/GAD / alcohol dependence - IV lorazepam ordered per CIWA protocol  - supplemental vitamins per CIWA protocol  -No signs of alcohol withdrawal - will try to give cymbalta thru NG tube if possible -Continue folic acid and thiamine  Essential hypertension -Patient was on Entresto and metoprolol succinate prior to admission -Continue IV Lopressor for now  Normocytic anemia  - continue daily morning CBC    Hyperbilirubinemia - likely secondary to  congestive changes in the setting of alcohol dependence  Generalized abdominal pain  - severe pain being treated with IV medications - IV hydromorphone ordered as needed     Rheumatoid arthritis  - he is relatively immunocompromised - his rheumatologist has him on prednisone   Tobacco user - nicotine patch ordered    Sinus tachycardia  - likely having beta blocker rebound tachycardia  - IV lopressor while NPO with hold parameters - resume home oral metoprolol XL 150 mg when able to take p.o. better  Hypokalemia -Replete -Check magnesium     Family Communication:   spouse at bedside 12/27  Consultants:  general surgery  Code Status:  FULL   DVT Prophylaxis:  IV Heparin   Procedures: As Listed in Progress Note Above  Antibiotics: Zosyn 12/25>>       Subjective: Patient complains of abdominal pain.  There is no flatus or bowel movement.  He has some nausea without emesis.  He denies any chest pain or shortness of breath.  He denies any fevers or chills.  Objective: Vitals:   02/17/23 1136 02/17/23 1355 02/17/23 1525 02/17/23 1702  BP: (!) 135/94 (!) 148/98  (!) 146/104  Pulse: 94 100  (!) 101  Resp:  20    Temp:  98.6 F (37 C) 98.6 F (37 C)   TempSrc:  Oral    SpO2:  97%    Weight:      Height:        Intake/Output Summary (Last 24 hours) at 02/17/2023 1706 Last data filed at 02/17/2023 1706 Gross per 24 hour  Intake 1458.58 ml  Output 1365 ml  Net 93.58 ml   Weight change: 15.9 kg Exam:  General:  Pt is alert, follows commands appropriately, not in acute distress HEENT: No icterus, No thrush, No neck mass, [place]/AT Cardiovascular: RRR, S1/S2, no rubs, no gallops Respiratory: Bibasilar crackles.  No wheezing.  Good air movement Abdomen: Soft/+BS, diffusely tender, mildly distended, no guarding Extremities: No edema, No lymphangitis, No petechiae, No rashes, no synovitis   Data Reviewed: I have personally reviewed following labs and imaging studies Basic Metabolic Panel: Recent Labs  Lab 02/14/23 1206 02/15/23 0513 02/16/23 0039 02/17/23 0031  NA 139 139 135 136  K 3.7 3.8 3.6 3.2*  CL 101 107 101 102   CO2 24 24 26 24   GLUCOSE 167* 121* 103* 86  BUN 22* 19 14 10   CREATININE 1.04 0.82 1.02 0.77  CALCIUM 8.9 7.8* 7.9* 8.2*  MG  --  1.6* 2.4  --   PHOS  --  4.3  --   --    Liver Function Tests: Recent Labs  Lab 02/14/23 1206 02/15/23 0513 02/16/23 0039 02/17/23 0031  AST 26 23 26 21   ALT 31 25 29 26   ALKPHOS 41 31* 42 40  BILITOT 1.9* 1.2* 1.4* 1.7*  PROT 6.7 5.1* 5.3* 5.3*  ALBUMIN 3.9 2.8* 2.6* 2.6*   Recent Labs  Lab 02/14/23 1206  LIPASE 42   No results for input(s): "AMMONIA" in the last 168 hours. Coagulation Profile: Recent Labs  Lab 02/15/23 1729  INR 1.5*   CBC: Recent Labs  Lab 02/14/23 1206 02/15/23 0513 02/16/23 0039 02/16/23 1221 02/17/23 0031  WBC 13.0* 14.2* 13.3* 9.4 9.0  NEUTROABS  --  11.9* 10.5*  --  6.9  HGB 18.5* 15.1 12.7* 11.7* 11.4*  HCT 52.0 42.5 37.5* 33.7* 33.5*  MCV 95.6 96.8 97.9 98.8 98.0  PLT 265 179 160 153 157  Cardiac Enzymes: No results for input(s): "CKTOTAL", "CKMB", "CKMBINDEX", "TROPONINI" in the last 168 hours. BNP: Invalid input(s): "POCBNP" CBG: No results for input(s): "GLUCAP" in the last 168 hours. HbA1C: No results for input(s): "HGBA1C" in the last 72 hours. Urine analysis:    Component Value Date/Time   COLORURINE YELLOW 02/14/2023 1246   APPEARANCEUR CLEAR 02/14/2023 1246   LABSPEC 1.027 02/14/2023 1246   PHURINE 5.0 02/14/2023 1246   GLUCOSEU >=500 (A) 02/14/2023 1246   HGBUR NEGATIVE 02/14/2023 1246   BILIRUBINUR NEGATIVE 02/14/2023 1246   KETONESUR NEGATIVE 02/14/2023 1246   PROTEINUR NEGATIVE 02/14/2023 1246   NITRITE NEGATIVE 02/14/2023 1246   LEUKOCYTESUR NEGATIVE 02/14/2023 1246   Sepsis Labs: @LABRCNTIP (procalcitonin:4,lacticidven:4) ) Recent Results (from the past 240 hours)  Resp panel by RT-PCR (RSV, Flu A&B, Covid) Anterior Nasal Swab     Status: None   Collection Time: 02/14/23  1:18 PM   Specimen: Anterior Nasal Swab  Result Value Ref Range Status   SARS Coronavirus 2 by RT  PCR NEGATIVE NEGATIVE Final    Comment: (NOTE) SARS-CoV-2 target nucleic acids are NOT DETECTED.  The SARS-CoV-2 RNA is generally detectable in upper respiratory specimens during the acute phase of infection. The lowest concentration of SARS-CoV-2 viral copies this assay can detect is 138 copies/mL. A negative result does not preclude SARS-Cov-2 infection and should not be used as the sole basis for treatment or other patient management decisions. A negative result may occur with  improper specimen collection/handling, submission of specimen other than nasopharyngeal swab, presence of viral mutation(s) within the areas targeted by this assay, and inadequate number of viral copies(<138 copies/mL). A negative result must be combined with clinical observations, patient history, and epidemiological information. The expected result is Negative.  Fact Sheet for Patients:  BloggerCourse.com  Fact Sheet for Healthcare Providers:  SeriousBroker.it  This test is no t yet approved or cleared by the [place] FDA and  has been authorized for detection and/or diagnosis of SARS-CoV-2 by FDA under an Emergency Use Authorization (EUA). This EUA will remain  in effect (meaning this test can be used) for the duration of the COVID-19 declaration under Section 564(b)(1) of the Act, 21 U.S.C.section 360bbb-3(b)(1), unless the authorization is terminated  or revoked sooner.       Influenza A by PCR NEGATIVE NEGATIVE Final   Influenza B by PCR NEGATIVE NEGATIVE Final    Comment: (NOTE) The Xpert Xpress SARS-CoV-2/FLU/RSV plus assay is intended as an aid in the diagnosis of influenza from Nasopharyngeal swab specimens and should not be used as a sole basis for treatment. Nasal washings and aspirates are unacceptable for Xpert Xpress SARS-CoV-2/FLU/RSV testing.  Fact Sheet for Patients: BloggerCourse.com  Fact Sheet for  Healthcare Providers: SeriousBroker.it  This test is not yet approved or cleared by the [place] FDA and has been authorized for detection and/or diagnosis of SARS-CoV-2 by FDA under an Emergency Use Authorization (EUA). This EUA will remain in effect (meaning this test can be used) for the duration of the COVID-19 declaration under Section 564(b)(1) of the Act, 21 U.S.C. section 360bbb-3(b)(1), unless the authorization is terminated or revoked.     Resp Syncytial Virus by PCR NEGATIVE NEGATIVE Final    Comment: (NOTE) Fact Sheet for Patients: BloggerCourse.com  Fact Sheet for Healthcare Providers: SeriousBroker.it  This test is not yet approved or cleared by the [place] FDA and has been authorized for detection and/or diagnosis of SARS-CoV-2 by FDA under an Emergency Use Authorization (EUA). This  EUA will remain in effect (meaning this test can be used) for the duration of the COVID-19 declaration under Section 564(b)(1) of the Act, 21 U.S.C. section 360bbb-3(b)(1), unless the authorization is terminated or revoked.  Performed at [facility], [address], [place], [place] [address]   Culture, blood (Routine X 2) w Reflex to ID Panel     Status: None (Preliminary result)   Collection Time: 02/14/23  2:20 PM   Specimen: Right Antecubital; Blood  Result Value Ref Range Status   Specimen Description RIGHT ANTECUBITAL  Final   Special Requests   Final    BOTTLES DRAWN AEROBIC AND ANAEROBIC Blood Culture adequate volume   Culture   Final    NO GROWTH 3 DAYS Performed at [facility], [address] [place], [place] [address]    Report Status PENDING  Incomplete  Culture, blood (Routine X 2) w Reflex to ID Panel     Status: None (Preliminary result)   Collection Time: 02/14/23  2:56 PM   Specimen: BLOOD  Result Value Ref Range Status   Specimen Description BLOOD BLOOD RIGHT FOREARM  Final    Special Requests   Final    BOTTLES DRAWN AEROBIC AND ANAEROBIC Blood Culture adequate volume   Culture   Final    NO GROWTH 3 DAYS Performed at [facility], [address] [place], [place] [address]    Report Status PENDING  Incomplete  Aerobic/Anaerobic Culture w Gram Stain (surgical/deep wound)     Status: None   Collection Time: 02/14/23  4:17 PM   Specimen: Path fluid; Body Fluid  Result Value Ref Range Status   Specimen Description WOUND  Final   Special Requests PURLANT ASCITES  Final   Gram Stain   Final    ABUNDANT WBC PRESENT, PREDOMINANTLY PMN RARE GRAM NEGATIVE RODS    Culture   Final    FEW ESCHERICHIA COLI FEW BACTEROIDES SPECIES BETA LACTAMASE POSITIVE Performed at [facility], [address] [place], [place] [address]    Report Status 02/17/2023 FINAL  Final   Organism ID, Bacteria ESCHERICHIA COLI  Final      Susceptibility   Escherichia coli - MIC*    AMPICILLIN 4 SENSITIVE Sensitive     CEFEPIME <=0.12 SENSITIVE Sensitive     CEFTAZIDIME <=1 SENSITIVE Sensitive     CEFTRIAXONE <=0.25 SENSITIVE Sensitive     CIPROFLOXACIN <=0.25 SENSITIVE Sensitive     GENTAMICIN <=1 SENSITIVE Sensitive     IMIPENEM <=0.25 SENSITIVE Sensitive     TRIMETH/SULFA <=20 SENSITIVE Sensitive     AMPICILLIN/SULBACTAM <=2 SENSITIVE Sensitive     PIP/TAZO <=4 SENSITIVE Sensitive ug/mL    * FEW ESCHERICHIA COLI  MRSA Next Gen by PCR, Nasal     Status: None   Collection Time: 02/14/23  6:30 PM   Specimen: Nasal Mucosa; Nasal Swab  Result Value Ref Range Status   MRSA by PCR Next Gen NOT DETECTED NOT DETECTED Final    Comment: (NOTE) The GeneXpert MRSA Assay (FDA approved for NASAL specimens only), is one component of a comprehensive MRSA colonization surveillance program. It is not intended to diagnose MRSA infection nor to guide or monitor treatment for MRSA infections. Test performance is not FDA approved in patients less than 36 years old. Performed at [facility], [address] [place], [place] [address]      Scheduled Meds:  Chlorhexidine Gluconate Cloth  6 each Topical Daily   colchicine  0.6 mg Oral Daily  DULoxetine  60 mg Oral Daily   folic acid  1 mg Intravenous Daily   metoprolol tartrate  2.5 mg Intravenous Q6H   multivitamin with minerals  1 tablet Oral Daily   nicotine  14 mg Transdermal q1600   pantoprazole (PROTONIX) IV  40 mg Intravenous Q12H   spironolactone  25 mg Oral Daily   thiamine  100 mg Oral Daily   Or   thiamine  100 mg Intravenous Daily   Continuous Infusions:  acetaminophen     heparin 2,700 Units/hr (02/17/23 1030)   lactated ringers 50 mL/hr at 02/17/23 1620   piperacillin-tazobactam (ZOSYN)  IV 3.375 g (02/17/23 1552)    Procedures/Studies: DG Chest Port 1 View Result Date: 02/17/2023 CLINICAL DATA:  Nasogastric tube positioning. Recent exploratory laparotomy. EXAM: PORTABLE CHEST 1 VIEW COMPARISON:  02/14/2026 FINDINGS: Stable heart size. Nasogastric tube continues to extend into the stomach which shows some persistent gaseous distension. Correlation suggested with effectiveness of wall suction and patency of the tube itself. Additional surgical Penrose drain again visible in the upper abdomen. Stable elevation of the right hemidiaphragm. Slight increase in bibasilar atelectasis. No overt edema. No pneumothorax or significant pleural fluid. IMPRESSION: 1. Nasogastric tube continues to extend into the stomach which shows some persistent gaseous distension. Correlation suggested with effectiveness of wall suction and patency of the tube itself. 2. Slight increase in bibasilar atelectasis. Electronically Signed   By: Irish Lack M.D.   On: 02/17/2023 16:43   DG CHEST PORT 1 VIEW Result Date: 02/15/2023 CLINICAL DATA:  Enteric tube placement EXAM: PORTABLE CHEST 1 VIEW COMPARISON:  Chest radiograph dated 02/14/2023 FINDINGS: Gastric/enteric tube tip projects over the stomach. Drainage catheter is seen  projecting over the left upper quadrant. Low lung volumes with bronchovascular crowding. Bibasilar patchy opacities. Enlarged cardiomediastinal silhouette is likely projectional. No definite pneumothorax or pleural effusion. IMPRESSION: 1. Gastric/enteric tube tip projects over the stomach. 2. Low lung volumes with bronchovascular crowding. Bibasilar patchy opacities, likely atelectasis. Electronically Signed   By: Agustin Cree M.D.   On: 02/15/2023 20:06   DG Chest Port 1 View Result Date: 02/14/2023 CLINICAL DATA:  NG tube placement EXAM: PORTABLE CHEST 1 VIEW COMPARISON:  02/14/2023 FINDINGS: NG tube tip is in the fundus of the stomach. Low lung volumes. Right base atelectasis. Left lung clear. Heart is normal size. No effusions. IMPRESSION: NG tube tip in the stomach. Low lung volumes.  Right base atelectasis. Electronically Signed   By: Charlett Nose M.D.   On: 02/14/2023 19:17   DG Chest Port 1 View Result Date: 02/14/2023 CLINICAL DATA:  Shortness of breath. EXAM: PORTABLE CHEST 1 VIEW COMPARISON:  02/16/2020. FINDINGS: Low lung volume. Mild pulmonary vascular congestion, which may be accentuated by low lung volume. No frank pulmonary edema. Bilateral lung fields are otherwise clear. No acute consolidation or lung collapse. Bilateral costophrenic angles are clear. Normal cardio-mediastinal silhouette. No acute osseous abnormalities. The soft tissues are within normal limits. IMPRESSION: *Mild diffuse pulmonary vascular congestion. Otherwise, no acute cardiopulmonary abnormality. Electronically Signed   By: Jules Schick M.D.   On: 02/14/2023 13:26   CT ABDOMEN PELVIS W CONTRAST Result Date: 02/14/2023 CLINICAL DATA:  Acute generalized abdominal pain. EXAM: CT ABDOMEN AND PELVIS WITH CONTRAST TECHNIQUE: Multidetector CT imaging of the abdomen and pelvis was performed using the standard protocol following bolus administration of intravenous contrast. RADIATION DOSE REDUCTION: This exam was performed  according to the departmental dose-optimization program which includes automated exposure control, adjustment of the mA  and/or kV according to patient size and/or use of iterative reconstruction technique. CONTRAST:  OMNIPAQUE IOHEXOL 300 MG/ML  SOLN COMPARISON:  February 11, 2020. FINDINGS: Lower chest: No acute abnormality. Hepatobiliary: No focal liver abnormality is seen. No gallstones, gallbladder wall thickening, or biliary dilatation. Pancreas: Unremarkable. No pancreatic ductal dilatation or surrounding inflammatory changes. Spleen: Normal in size without focal abnormality. Adrenals/Urinary Tract: Adrenal glands appear normal. Stable left renal cyst is noted for which no further follow-up is required. No hydronephrosis or renal obstruction is noted. Urinary bladder is unremarkable. Stomach/Bowel: The stomach is unremarkable. The appendix appears normal. Mild amount of free air is noted in the epigastric region and in the pelvis consistent with rupture of hollow viscus. Mildly dilated and thick walled small bowel loops are noted in the pelvis and lower abdomen suggesting enteritis. There also appears to be mild sigmoid wall thickening suggesting possible diverticulitis. There is noted air between a small bowel loop and sigmoid colon, and therefore the source of perforation is not clearly identifiable. Vascular/Lymphatic: No significant vascular findings are present. No enlarged abdominal or pelvic lymph nodes. Reproductive: Prostate is unremarkable. Other: No ascites or hernia is noted. Musculoskeletal: No acute or significant osseous findings. IMPRESSION: Pneumoperitoneum is noted both in the epigastric and pelvic regions concerning for rupture of hollow viscus. Mildly dilated and inflamed small bowel loops are noted in lower abdomen and pelvis as well as mild sigmoid wall thickening suggesting possible inflammation there is well. It is uncertain if the pneumoperitoneum is secondary to ruptured small  or large bowel. Critical Value/emergent results were called by telephone at the time of interpretation on 02/14/2023 at 1:22 pm to provider JOSEPH ZAMMIT , who verbally acknowledged these results. Electronically Signed   By: Lupita Raider M.D.   On: 02/14/2023 13:24   ECHOCARDIOGRAM COMPLETE Result Date: 01/26/2023    ECHOCARDIOGRAM REPORT   Patient Name:   STEPAN BRODERSON Date of Exam: 01/26/2023 Medical Rec #:  [id number]           Height:       74.0 in Accession #:    [id number]          Weight:       247.0 lb Date of Birth:  12-18-1975          BSA:          2.377 m Patient Age:    47 years            BP:           122/99 mmHg Patient Gender: M                   HR:           72 bpm. [facility] Procedure: 2D Echo, 3D Echo, Cardiac Doppler, Color Doppler and Strain Analysis Indications:    I50.40* Unspecified combined systolic (congestive) and diastolic                 (congestive) heart failure  History:        Patient has prior history of Echocardiogram examinations, most                 recent 02/16/2020. Cardiomyopathy and CHF,                 Signs/Symptoms:Shortness of Breath, Dyspnea and Fatigue; Risk                 Factors:Hypertension and Dyslipidemia.  Pulmonary embolus.  Sonographer:    Sheralyn Boatman RDCS Referring Phys: 36 TESSA N CONTE  Sonographer Comments: Image acquisition challenging due to patient body habitus and Image acquisition challenging due to respiratory motion. IMPRESSIONS  1. Left ventricular ejection fraction, by estimation, is 50 to 55%. The left ventricle has low normal function. The left ventricle has no regional wall motion abnormalities. The left ventricular internal cavity size was mildly dilated. There is mild concentric left ventricular hypertrophy. Left ventricular diastolic parameters are consistent with Grade I diastolic dysfunction (impaired relaxation). The average left ventricular global longitudinal strain is -19.3 %. The global longitudinal strain  is normal.  2. Right ventricular systolic function is normal. The right ventricular size is normal. Tricuspid regurgitation signal is inadequate for assessing PA pressure.  3. The mitral valve is grossly normal. Mild mitral valve regurgitation.  4. The aortic valve is tricuspid. Aortic valve regurgitation is not visualized. No aortic stenosis is present. Aortic valve mean gradient measures 3.0 mmHg.  5. Aortic dilatation noted. There is moderate dilatation of the aortic root, measuring 44 mm.  6. The inferior vena cava is normal in size with greater than 50% respiratory variability, suggesting right atrial pressure of 3 mmHg. Comparison(s): Prior images reviewed side by side. LVEF improved significantly in comparison, now 50-55% range. Mitral regurgitation is mild. Moderately dilated aortic root. FINDINGS  Left Ventricle: Left ventricular ejection fraction, by estimation, is 50 to 55%. The left ventricle has low normal function. The left ventricle has no regional wall motion abnormalities. The average left ventricular global longitudinal strain is -19.3 %. The global longitudinal strain is normal. The left ventricular internal cavity size was mildly dilated. There is mild concentric left ventricular hypertrophy. Left ventricular diastolic parameters are consistent with Grade I diastolic dysfunction (impaired relaxation). Right Ventricle: The right ventricular size is normal. No increase in right ventricular wall thickness. Right ventricular systolic function is normal. Tricuspid regurgitation signal is inadequate for assessing PA pressure. Left Atrium: Left atrial size was normal in size. Right Atrium: Right atrial size was normal in size. Pericardium: There is no evidence of pericardial effusion. Presence of epicardial fat layer. Mitral Valve: The mitral valve is grossly normal. Mild mitral valve regurgitation. Tricuspid Valve: The tricuspid valve is grossly normal. Tricuspid valve regurgitation is trivial. Aortic  Valve: The aortic valve is tricuspid. There is mild aortic valve annular calcification. Aortic valve regurgitation is not visualized. No aortic stenosis is present. Aortic valve mean gradient measures 3.0 mmHg. Aortic valve peak gradient measures 5.1 mmHg. Aortic valve area, by VTI measures 3.51 cm. Pulmonic Valve: The pulmonic valve was grossly normal. Pulmonic valve regurgitation is trivial. Aorta: Aortic dilatation noted. There is moderate dilatation of the aortic root, measuring 44 mm. Venous: The inferior vena cava is normal in size with greater than 50% respiratory variability, suggesting right atrial pressure of 3 mmHg. IAS/Shunts: No atrial level shunt detected by color flow Doppler.  LEFT VENTRICLE PLAX 2D LVIDd:         6.20 cm      Diastology LVIDs:         5.20 cm      LV e' medial:    4.46 cm/s LV PW:         1.10 cm      LV E/e' medial:  11.1 LV IVS:        0.90 cm      LV e' lateral:   8.70 cm/s LVOT diam:     2.40 cm  LV E/e' lateral: 5.7 LV SV:         72 LV SV Index:   30           2D Longitudinal Strain LVOT Area:     4.52 cm     2D Strain GLS Avg:     -19.3 %  LV Volumes (MOD) LV vol d, MOD A2C: 138.0 ml 3D Volume EF: LV vol d, MOD A4C: 113.0 ml 3D EF:        51 % LV vol s, MOD A2C: 62.5 ml  LV EDV:       214 ml LV vol s, MOD A4C: 55.6 ml  LV ESV:       105 ml LV SV MOD A2C:     75.5 ml  LV SV:        109 ml LV SV MOD A4C:     113.0 ml LV SV MOD BP:      66.7 ml RIGHT VENTRICLE RV S prime:     10.90 cm/s TAPSE (M-mode): 2.1 cm LEFT ATRIUM             Index        RIGHT ATRIUM           Index LA diam:        2.30 cm 0.97 cm/m   RA Area:     11.90 cm LA Vol (A2C):   71.1 ml 29.91 ml/m  RA Volume:   26.30 ml  11.06 ml/m LA Vol (A4C):   37.9 ml 15.94 ml/m LA Biplane Vol: 54.7 ml 23.01 ml/m  AORTIC VALVE AV Area (Vmax):    3.35 cm AV Area (Vmean):   3.31 cm AV Area (VTI):     3.51 cm AV Vmax:           113.00 cm/s AV Vmean:          73.300 cm/s AV VTI:            0.206 m AV Peak Grad:       5.1 mmHg AV Mean Grad:      3.0 mmHg LVOT Vmax:         83.70 cm/s LVOT Vmean:        53.600 cm/s LVOT VTI:          0.160 m LVOT/AV VTI ratio: 0.78  AORTA Ao Root diam: 4.40 cm Ao Asc diam:  3.65 cm MITRAL VALVE MV Area (PHT): 3.21 cm    SHUNTS MV Decel Time: 236 msec    Systemic VTI:  0.16 m MV E velocity: 49.70 cm/s  Systemic Diam: 2.40 cm MV A velocity: 72.80 cm/s MV E/A ratio:  0.68 Nona Dell MD Electronically signed by Nona Dell MD Signature Date/Time: 01/26/2023/4:11:39 PM    Final     Catarina Hartshorn, DO  [facility]  If 7PM-7AM, please contact night-coverage [email or web address] Password [email or web address] 02/17/2023, 5:06 PM   LOS: 3 days

## 2023-02-17 NOTE — Consult Note (Signed)
.  Pharmacy Consult Note - Anticoagulation  Pharmacy Consult for Heparin Indication:  history of pulmonary embolism  PATIENT MEASUREMENTS: Height: 6\' 2"  (188 cm) Weight: 111.7 kg (246 lb 4.1 oz) IBW/kg (Calculated) : 82.2 HEPARIN DW (KG): 95.8  VITAL SIGNS: Temp: 98.5 F (36.9 C) (12/27 0605) Temp Source: Oral (12/27 0605) BP: 135/94 (12/27 1136) Pulse Rate: 94 (12/27 1136)  Recent Labs    02/14/23 1206 02/15/23 0513 02/15/23 1729 02/16/23 0039 02/17/23 0031 02/17/23 1009  HGB 18.5*   < >  --    < > 11.4*  --   HCT 52.0   < >  --    < > 33.5*  --   PLT 265   < >  --    < > 157  --   APTT  --   --  36  --   --   --   LABPROT  --   --  18.4*  --   --   --   INR  --   --  1.5*  --   --   --   HEPARINUNFRC  --   --  <0.10*   < > 0.17* 0.36  CREATININE 1.04   < >  --    < > 0.77  --   TROPONINIHS 4  --   --   --   --   --    < > = values in this interval not displayed.    Estimated Creatinine Clearance: 151.8 mL/min (by C-G formula based on SCr of 0.77 mg/dL).  PAST MEDICAL HISTORY: Past Medical History:  Diagnosis Date   Asthma    AS A CHILD ONLY   DVT (deep venous thrombosis) (HCC)    Foot pain, right    painful x 1 month and swelling   Idiopathic gout     ASSESSMENT: 47 y.o. male with PMH including pulmonary embolism in 2023 is presenting with a bowel perforation, and is now s/p exploratory laparotomy. Patient is on chronic anticoagulation with Eliquis. Last dose was 02/13/2023 in the evening. Patient received Kcentra on 12/24 due to need for emergent procedure. Pharmacy has been consulted to initiate and manage heparin intravenous infusion.  Date Time HL Rate/Comment 12/27 0031 0.17 Subtherapeutic 12/27 1009 0.36 Therapeutic x 1  Goal(s) of therapy: Heparin level 0.3-0.7 units/mL Monitor platelets by anticoagulation protocol: Yes   PLAN: Continue heparin infusion at 2700 units/hr Heparin level in 6 hours Caution regarding boluses due to recent  surgery   Will M. Dareen Piano, PharmD Clinical Pharmacist 02/17/2023 12:08 PM

## 2023-02-17 NOTE — Progress Notes (Addendum)
[facility]  Doing fair. Ng out. No more flatus. Feeling more distended.  BP 129/83 (BP Location: Right Arm)   Pulse 94   Temp 98.5 F (36.9 C) (Oral)   Resp 18   Ht 6\' 2"  (1.88 m)   Wt 111.7 kg   SpO2 97%   BMI 31.62 kg/m  Soft, distended, JP with SS output Midline with old staining, no erythema or draining   Patient s/p Ex lap for pneumoperitoneum, unknown source, drain placement X 2. PRN narcotics IS, OOB CIWA HR and BP improved NPO, may need to replace NG if vomits or if gets more uncomfortable Ileus  JP in place  Ice for comfort, no water now given NG out H&H stabilized and WBC normal, Zosyn for  5 days post op, ending 12/29  SCDs, ambulate, heparin gtt  Sent Dr. Marletta Lor message about Egd and colonoscopy needed. CT before any feeding, awaiting bowel function before CT ordered. Can have hard candy and gum.   Algis Greenhouse, MD [facility] [address] [place], [place] [address] [phone] (office)

## 2023-02-17 NOTE — Progress Notes (Signed)
[facility]  NG replaced. Output 1500 after placement. CXR done and in stomach. Looked at midline and removed honeycomb, no redness extending out. ABD and papertape placed.  Binder PRN dilaudid ordered 0.5-1mg  q 2  Stopped the roxicodone given the output  Ileus continues  Robaxin for muscle spasms IV PRN  Labs in AM  Algis Greenhouse, MD [facility] [address] [place], [place] [address] [phone] (office)

## 2023-02-17 NOTE — Consult Note (Signed)
.  Pharmacy Consult Note - Anticoagulation  Pharmacy Consult for Heparin Indication:  history of pulmonary embolism  PATIENT MEASUREMENTS: Height: 6\' 2"  (188 cm) Weight: 95.1 kg (209 lb 10.5 oz) IBW/kg (Calculated) : 82.2 HEPARIN DW (KG): 95.8  VITAL SIGNS: Temp: 98.9 F (37.2 C) (12/26 2236) Temp Source: Oral (12/26 2236) BP: 131/88 (12/26 2236) Pulse Rate: 105 (12/26 2236)  Recent Labs    02/14/23 1206 02/15/23 0513 02/15/23 1729 02/16/23 0039 02/16/23 0929 02/17/23 0031  HGB 18.5*   < >  --  12.7*   < > 11.4*  HCT 52.0   < >  --  37.5*   < > 33.5*  PLT 265   < >  --  160   < > 157  APTT  --   --  36  --   --   --   LABPROT  --   --  18.4*  --   --   --   INR  --   --  1.5*  --   --   --   HEPARINUNFRC  --    < > <0.10* 0.16*   < > 0.17*  CREATININE 1.04   < >  --  1.02  --   --   TROPONINIHS 4  --   --   --   --   --    < > = values in this interval not displayed.    Estimated Creatinine Clearance: 104.1 mL/min (by C-G formula based on SCr of 1.02 mg/dL).  PAST MEDICAL HISTORY: Past Medical History:  Diagnosis Date   Asthma    AS A CHILD ONLY   DVT (deep venous thrombosis) (HCC)    Foot pain, right    painful x 1 month and swelling   Idiopathic gout     ASSESSMENT: 47 y.o. male with PMH including pulmonary embolism in 2023 is presenting with a bowel perforation, and is now s/p exploratory laparotomy. Patient is on chronic anticoagulation with Eliquis. Last dose was 02/13/2023 in the evening. Patient received Kcentra yesterday (12/24) due to need for emergent procedure. I am uncertain of Kcentra's impact on antiXa levels in terms of potential false elevation, so will order both aPTT and antiXa level, as well as checking baseline antiXa level. Pharmacy has been consulted to initiate and manage heparin intravenous infusion.  12/27 AM update:  Heparin level sub-therapeutic   Goal(s) of therapy: Heparin level 0.3-0.7 units/mL Monitor platelets by anticoagulation  protocol: Yes   PLAN: No boluses (recent surgery) Inc heparin to 2700 units/hr Heparin level in 8 hours   Abran Duke, PharmD, BCPS Clinical Pharmacist Phone: [phone]

## 2023-02-18 DIAGNOSIS — R198 Other specified symptoms and signs involving the digestive system and abdomen: Secondary | ICD-10-CM | POA: Diagnosis not present

## 2023-02-18 DIAGNOSIS — I5022 Chronic systolic (congestive) heart failure: Secondary | ICD-10-CM | POA: Diagnosis not present

## 2023-02-18 DIAGNOSIS — M0600 Rheumatoid arthritis without rheumatoid factor, unspecified site: Secondary | ICD-10-CM

## 2023-02-18 DIAGNOSIS — K922 Gastrointestinal hemorrhage, unspecified: Secondary | ICD-10-CM

## 2023-02-18 DIAGNOSIS — A419 Sepsis, unspecified organism: Secondary | ICD-10-CM | POA: Diagnosis not present

## 2023-02-18 LAB — CBC WITH DIFFERENTIAL/PLATELET
Abs Immature Granulocytes: 0.05 10*3/uL (ref 0.00–0.07)
Basophils Absolute: 0 10*3/uL (ref 0.0–0.1)
Basophils Relative: 0 %
Eosinophils Absolute: 0.3 10*3/uL (ref 0.0–0.5)
Eosinophils Relative: 4 %
HCT: 38.1 % — ABNORMAL LOW (ref 39.0–52.0)
Hemoglobin: 12.8 g/dL — ABNORMAL LOW (ref 13.0–17.0)
Immature Granulocytes: 1 %
Lymphocytes Relative: 15 %
Lymphs Abs: 1.2 10*3/uL (ref 0.7–4.0)
MCH: 32.8 pg (ref 26.0–34.0)
MCHC: 33.6 g/dL (ref 30.0–36.0)
MCV: 97.7 fL (ref 80.0–100.0)
Monocytes Absolute: 0.5 10*3/uL (ref 0.1–1.0)
Monocytes Relative: 7 %
Neutro Abs: 5.8 10*3/uL (ref 1.7–7.7)
Neutrophils Relative %: 73 %
Platelets: 208 10*3/uL (ref 150–400)
RBC: 3.9 MIL/uL — ABNORMAL LOW (ref 4.22–5.81)
RDW: 13.1 % (ref 11.5–15.5)
WBC: 7.9 10*3/uL (ref 4.0–10.5)
nRBC: 0 % (ref 0.0–0.2)

## 2023-02-18 LAB — COMPREHENSIVE METABOLIC PANEL
ALT: 26 U/L (ref 0–44)
AST: 26 U/L (ref 15–41)
Albumin: 2.9 g/dL — ABNORMAL LOW (ref 3.5–5.0)
Alkaline Phosphatase: 45 U/L (ref 38–126)
Anion gap: 11 (ref 5–15)
BUN: 9 mg/dL (ref 6–20)
CO2: 23 mmol/L (ref 22–32)
Calcium: 8.3 mg/dL — ABNORMAL LOW (ref 8.9–10.3)
Chloride: 101 mmol/L (ref 98–111)
Creatinine, Ser: 0.73 mg/dL (ref 0.61–1.24)
GFR, Estimated: >60 mL/min (ref >60)
Glucose, Bld: 84 mg/dL (ref 70–99)
Potassium: 3.3 mmol/L — ABNORMAL LOW (ref 3.5–5.1)
Sodium: 135 mmol/L (ref 135–145)
Total Bilirubin: 1.6 mg/dL — ABNORMAL HIGH (ref <1.2)
Total Protein: 5.8 g/dL — ABNORMAL LOW (ref 6.5–8.1)

## 2023-02-18 LAB — OCCULT BLOOD GASTRIC / DUODENUM (SPECIMEN CUP)
Occult Blood, Gastric: POSITIVE — AB
pH, Gastric: 3

## 2023-02-18 LAB — MAGNESIUM: Magnesium: 1.9 mg/dL (ref 1.7–2.4)

## 2023-02-18 LAB — HEMOGLOBIN AND HEMATOCRIT, BLOOD
HCT: 34.6 % — ABNORMAL LOW (ref 39.0–52.0)
Hemoglobin: 12.2 g/dL — ABNORMAL LOW (ref 13.0–17.0)

## 2023-02-18 LAB — HEPARIN LEVEL (UNFRACTIONATED): Heparin Unfractionated: 0.4 [IU]/mL (ref 0.30–0.70)

## 2023-02-18 LAB — PHOSPHORUS: Phosphorus: 2.8 mg/dL (ref 2.5–4.6)

## 2023-02-18 MED ORDER — POTASSIUM CHLORIDE 10 MEQ/100ML IV SOLN
10.0000 meq | INTRAVENOUS | Status: AC
  Administered 2023-02-18 (×4): 10 meq via INTRAVENOUS
  Filled 2023-02-18: qty 100

## 2023-02-18 MED ORDER — LACTATED RINGERS IV SOLN: INTRAVENOUS | Status: AC

## 2023-02-18 MED ORDER — MAGNESIUM SULFATE 2 GM/50ML IV SOLN
2.0000 g | Freq: Once | INTRAVENOUS | Status: AC
  Administered 2023-02-18: 2 g via INTRAVENOUS
  Filled 2023-02-18: qty 50

## 2023-02-18 NOTE — Consult Note (Signed)
.  Pharmacy Consult Note - Anticoagulation  Pharmacy Consult for Heparin Indication:  history of pulmonary embolism  PATIENT MEASUREMENTS: Height: 6\' 2"  (188 cm) Weight: 110.6 kg (243 lb 13.3 oz) IBW/kg (Calculated) : 82.2 HEPARIN DW (KG): 95.8  VITAL SIGNS: Temp: 98.5 F (36.9 C) (12/28 0344) Temp Source: Oral (12/28 0344) BP: 134/88 (12/28 0344) Pulse Rate: 92 (12/28 0344)  Recent Labs    02/15/23 1729 02/16/23 0039 02/18/23 0406  HGB  --    < > 12.8*  HCT  --    < > 38.1*  PLT  --    < > 208  APTT 36  --   --   LABPROT 18.4*  --   --   INR 1.5*  --   --   HEPARINUNFRC <0.10*   < > 0.40  CREATININE  --    < > 0.73   < > = values in this interval not displayed.    Estimated Creatinine Clearance: 151.1 mL/min (by C-G formula based on SCr of 0.73 mg/dL).  PAST MEDICAL HISTORY: Past Medical History:  Diagnosis Date   Asthma    AS A CHILD ONLY   DVT (deep venous thrombosis) (HCC)    Foot pain, right    painful x 1 month and swelling   Idiopathic gout     ASSESSMENT: 47 y.o. male with PMH including pulmonary embolism in 2023 is presenting with a bowel perforation, and is now s/p exploratory laparotomy. Patient is on chronic anticoagulation with Eliquis. Last dose was 02/13/2023 in the evening. Patient received Kcentra on 12/24 due to need for emergent procedure. Pharmacy has been consulted to initiate and manage heparin intravenous infusion.  Date Time HL Rate/Comment 12/27 0031 0.17 Subtherapeutic 12/27 1009 0.36 Therapeutic x 1 12/27 1713 0.48 Therapeutic x 2 12/28 0406 0.40 Therapeutic  Goal(s) of therapy: Heparin level 0.3-0.7 units/mL Monitor platelets by anticoagulation protocol: Yes   PLAN: Continue heparin infusion at 2700 units/hr Heparin level next with tomorrow AM labs Caution regarding boluses due to recent surgery   Will M. Dareen Piano, PharmD Clinical Pharmacist 02/18/2023 9:48 AM

## 2023-02-18 NOTE — Progress Notes (Signed)
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  He presented from home by EMS complaining of severe generalized abdominal pain with nausea vomiting and diarrhea that started early this morning.  The pain is 10/10.  He feels very ill and having palpitations.  He denies fever and chills.  In the ED he was noted to have a rigid abdomen and sent for CT abdomen pelvis with findings of concern for a perforated abdominal viscus.  Emergent surgical consultation requested.  He is started on IV antibiotics and IV fluids and admission requested for further management. The patient underwent exploratory laparotomy for his pneumoperitoneum on 02/14/2023.  A JP drain was placed over the stomach and into the pelvis.  General surgery continue to follow-up for postoperative care.  His hospitalization has been prolonged secondary to patient removal of his NG tube and postoperative ileus. NG tube was replaced on 02/17/2023 putting out 1500 cc.   Assessment/Plan: Sepsis from intraabdominal infection/pneumoperitoneum - pt emergently taken to OR on 12/24 by Dr. Henreitta Leber for exploratory lap -sepsis physiology resolving and markedly improved  -Continue Zosyn -Continue IV fluids -Lactic acid 2.9>> 1.4 - lactic acidosis resolved    Perforated abdominal viscous with pneumoperitoneum/postoperative ileus - Dr. Henreitta Leber took him to OR emergently after Kcentra  treatment on 12/24  - surgery planning for CT abdomen/pelvis with oral contrast when bowel function returns - continue supportive measures - NG came out x2 -NG tube replaced 02/17/2023 with 1500 cc output -Changes  medications to IV when possible -may clamp NG short time to allow for ambulation hallway   History of PE/ Acquired thrombophilia -Patient diagnosed with PE and right lower extremity DVT 01/2020 -Felt to be secondary to underlying COVID-19 infection - last dose of apixaban was taken 12/23 at 6pm - K-centra was given 12/24   - IV heparin started no bolus 12/25 per surgery  -Monitor hemoglobin>>remains stable   Chronic HFrEF -02/05/2023 echo EF 55%, no WMA, normal RVF, grade 1 DD - 02/16/2020 echo EF < 20 % -Initially felt to be secondary to alcohol abuse -Entresto on hold until patient able to tolerate p.o. -Holding Jardiance secondary to acute infectious process -Metoprolol succinate transition to IV Lopressor until able to tolerate p.o. -Restart spironolactone when able to tolerate p.o. -Appears clinically euvolemic   Leukemoid reaction - secondary to abdominal infection and surgery - following blood cultures x 2 : no growth to date  - unfortunately IV ceftriaxone had already been given prior to blood cultures - WBC improved   Hypomagnesemia - IV replacement ordered and repleted    Depression/GAD / alcohol dependence - IV lorazepam ordered per CIWA protocol  - supplemental vitamins per CIWA protocol  -No signs of alcohol withdrawal - will try to give cymbalta thru NG tube if possible -Continue folic acid and thiamine   Essential hypertension -Patient was on Entresto and metoprolol succinate prior to admission -Continue IV Lopressor for now   Normocytic anemia  - continue daily morning  CBC    Hyperbilirubinemia - likely secondary to congestive changes in the setting of alcohol dependence   Generalized abdominal pain  - severe pain being treated with IV  medications - IV hydromorphone ordered as needed    Rheumatoid arthritis  - he is relatively immunocompromised - his rheumatologist has him on prednisone   Tobacco user - nicotine patch ordered    Sinus tachycardia  - likely having beta blocker rebound tachycardia  - IV lopressor while NPO with hold parameters - resume home oral metoprolol XL 150 mg when able to take p.o. better   Hypokalemia -Repleted -Check magnesium 1.9         Family Communication:   spouse at bedside 12/27   Consultants:  general surgery   Code Status:  FULL    DVT Prophylaxis:  IV Heparin     Procedures: As Listed in Progress Note Above   Antibiotics: Zosyn 12/25>>12/29      Subjective:  Pt does not have flatus or BM.  Denies f/c, cp, sob, n/v.  Complains of abd pain--controlled with IV opioids Objective: Vitals:   02/17/23 2142 02/18/23 0344 02/18/23 1304 02/18/23 1356  BP: (!) 141/93 134/88 124/86 124/86  Pulse: 87 92 84 84  Resp: 20 18 20 20   Temp: 98 F (36.7 C) 98.5 F (36.9 C) 97.9 F (36.6 C) 97.9 F (36.6 C)  TempSrc: Oral Oral Oral Oral  SpO2: 97% 97% 94% 94%  Weight:  110.6 kg    Height:        Intake/Output Summary (Last 24 hours) at 02/18/2023 1718 Last data filed at 02/18/2023 1653 Gross per 24 hour  Intake 1254.66 ml  Output 4090 ml  Net -2835.34 ml   Weight change: -1.1 kg Exam:  General:  Pt is alert, follows commands appropriately, not in acute distress HEENT: No icterus, No thrush, No neck mass, [employer]/AT Cardiovascular: RRR, S1/S2, no rubs, no gallops Respiratory: CTA bilaterally, no wheezing, no crackles, no rhonchi Abdomen: Soft/+BS, diffusely tender, non distended, no guarding Extremities: No edema, No lymphangitis, No petechiae, No rashes, no synovitis   Data Reviewed: I have personally reviewed following labs and imaging studies Basic Metabolic Panel: Recent Labs  Lab 02/14/23 1206 02/15/23 0513 02/16/23 0039 02/17/23 0031 02/18/23 0406   NA 139 139 135 136 135  K 3.7 3.8 3.6 3.2* 3.3*  CL 101 107 101 102 101  CO2 24 24 26 24 23   GLUCOSE 167* 121* 103* 86 84  BUN 22* 19 14 10 9   CREATININE 1.04 0.82 1.02 0.77 0.73  CALCIUM 8.9 7.8* 7.9* 8.2* 8.3*  MG  --  1.6* 2.4  --  1.9  PHOS  --  4.3  --   --  2.8   Liver Function Tests: Recent Labs  Lab 02/14/23 1206 02/15/23 0513 02/16/23 0039 02/17/23 0031 02/18/23 0406  AST 26 23 26 21 26   ALT 31 25 29 26 26   ALKPHOS 41 31* 42 40 45  BILITOT 1.9* 1.2* 1.4* 1.7* 1.6*  PROT 6.7 5.1* 5.3* 5.3* 5.8*  ALBUMIN 3.9 2.8* 2.6* 2.6* 2.9*   Recent Labs  Lab 02/14/23 1206  LIPASE 42   No results for input(s): "AMMONIA" in the last 168 hours. Coagulation Profile: Recent Labs  Lab 02/15/23 1729  INR 1.5*   CBC: Recent Labs  Lab 02/15/23 0513 02/16/23 0039 02/16/23 1221 02/17/23 0031 02/18/23 0406 02/18/23 1042  WBC 14.2* 13.3* 9.4 9.0 7.9  --   NEUTROABS 11.9* 10.5*  --  6.9 5.8  --   HGB 15.1 12.7* 11.7* 11.4* 12.8* 12.2*  HCT 42.5 37.5* 33.7* 33.5* 38.1* 34.6*  MCV 96.8 97.9 98.8 98.0 97.7  --   PLT 179 160 153 157 208  --    Cardiac Enzymes: No results for input(s): "CKTOTAL", "CKMB", "CKMBINDEX", "TROPONINI" in the last 168 hours. BNP: Invalid input(s): "POCBNP" CBG: No results for input(s): "GLUCAP" in the last 168 hours. HbA1C: No results for input(s): "HGBA1C" in the last 72 hours. Urine analysis:    Component Value Date/Time   COLORURINE YELLOW 02/14/2023 1246   APPEARANCEUR CLEAR 02/14/2023 1246   LABSPEC 1.027 02/14/2023 1246   PHURINE 5.0 02/14/2023 1246   GLUCOSEU >=500 (A) 02/14/2023 1246   HGBUR NEGATIVE 02/14/2023 1246   BILIRUBINUR NEGATIVE 02/14/2023 1246   KETONESUR NEGATIVE 02/14/2023 1246   PROTEINUR NEGATIVE 02/14/2023 1246   NITRITE NEGATIVE 02/14/2023 1246   LEUKOCYTESUR NEGATIVE 02/14/2023 1246   Sepsis Labs: @LABRCNTIP (procalcitonin:4,lacticidven:4) ) Recent Results (from the past 240 hours)  Resp panel by RT-PCR  (RSV, Flu A&B, Covid) Anterior Nasal Swab     Status: None   Collection Time: 02/14/23  1:18 PM   Specimen: Anterior Nasal Swab  Result Value Ref Range Status   SARS Coronavirus 2 by RT PCR NEGATIVE NEGATIVE Final    Comment: (NOTE) SARS-CoV-2 target nucleic acids are NOT DETECTED.  The SARS-CoV-2 RNA is generally detectable in upper respiratory specimens during the acute phase of infection. The lowest concentration of SARS-CoV-2 viral copies this assay can detect is 138 copies/mL. A negative result does not preclude SARS-Cov-2 infection and should not be used as the sole basis for treatment or other patient management decisions. A negative result may occur with  improper specimen collection/handling, submission of specimen other than nasopharyngeal swab, presence of viral mutation(s) within the areas targeted by this assay, and inadequate number of viral copies(<138 copies/mL). A negative result must be combined with clinical observations, patient history, and epidemiological information. The expected result is Negative.  Fact Sheet for Patients:  BloggerCourse.com  Fact Sheet for Healthcare Providers:  SeriousBroker.it  This test is no t yet approved or cleared by the [place] FDA and  has been authorized for detection and/or diagnosis of SARS-CoV-2 by FDA under an Emergency Use Authorization (EUA). This EUA will remain  in effect (meaning this test can be used) for the duration of the COVID-19 declaration under Section 564(b)(1) of the Act, 21 U.S.C.section 360bbb-3(b)(1), unless the authorization is terminated  or revoked sooner.       Influenza A by PCR NEGATIVE NEGATIVE Final   Influenza B by PCR NEGATIVE NEGATIVE Final    Comment: (NOTE) The Xpert Xpress SARS-CoV-2/FLU/RSV plus assay is intended as an aid in the diagnosis of influenza from Nasopharyngeal swab specimens and should not be used as a sole basis for  treatment. Nasal washings and aspirates are unacceptable for Xpert Xpress SARS-CoV-2/FLU/RSV testing.  Fact Sheet for Patients: BloggerCourse.com  Fact Sheet for Healthcare Providers: SeriousBroker.it  This test is not yet approved or cleared by the [place] FDA and has been authorized for detection and/or diagnosis of SARS-CoV-2 by FDA under an Emergency Use Authorization (EUA). This EUA will remain in effect (meaning this test can be used) for the duration of the COVID-19 declaration under Section 564(b)(1) of the Act, 21 U.S.C. section 360bbb-3(b)(1), unless the authorization is terminated or revoked.     Resp Syncytial Virus by PCR NEGATIVE NEGATIVE Final    Comment: (NOTE) Fact Sheet  for Patients: BloggerCourse.com  Fact Sheet for Healthcare Providers: SeriousBroker.it  This test is not yet approved or cleared by the [place] FDA and has been authorized for detection and/or diagnosis of SARS-CoV-2 by FDA under an Emergency Use Authorization (EUA). This EUA will remain in effect (meaning this test can be used) for the duration of the COVID-19 declaration under Section 564(b)(1) of the Act, 21 U.S.C. section 360bbb-3(b)(1), unless the authorization is terminated or revoked.  Performed at [facility], [address], [place], [place] [address]   Culture, blood (Routine X 2) w Reflex to ID Panel     Status: None (Preliminary result)   Collection Time: 02/14/23  2:20 PM   Specimen: Right Antecubital; Blood  Result Value Ref Range Status   Specimen Description RIGHT ANTECUBITAL  Final   Special Requests   Final    BOTTLES DRAWN AEROBIC AND ANAEROBIC Blood Culture adequate volume   Culture   Final    NO GROWTH 4 DAYS Performed at [facility], [address] [place], [place] [address]    Report Status PENDING  Incomplete  Culture, blood (Routine X 2) w Reflex to  ID Panel     Status: None (Preliminary result)   Collection Time: 02/14/23  2:56 PM   Specimen: BLOOD  Result Value Ref Range Status   Specimen Description BLOOD BLOOD RIGHT FOREARM  Final   Special Requests   Final    BOTTLES DRAWN AEROBIC AND ANAEROBIC Blood Culture adequate volume   Culture   Final    NO GROWTH 4 DAYS Performed at [facility], [address], [place], [place] [address]    Report Status PENDING  Incomplete  Aerobic/Anaerobic Culture w Gram Stain (surgical/deep wound)     Status: None   Collection Time: 02/14/23  4:17 PM   Specimen: Path fluid; Body Fluid  Result Value Ref Range Status   Specimen Description WOUND  Final   Special Requests PURLANT ASCITES  Final   Gram Stain   Final    ABUNDANT WBC PRESENT, PREDOMINANTLY PMN RARE GRAM NEGATIVE RODS    Culture   Final    FEW ESCHERICHIA COLI FEW BACTEROIDES SPECIES BETA LACTAMASE POSITIVE Performed at [facility], [address] [place], [place] [address]    Report Status 02/17/2023 FINAL  Final   Organism ID, Bacteria ESCHERICHIA COLI  Final      Susceptibility   Escherichia coli - MIC*    AMPICILLIN 4 SENSITIVE Sensitive     CEFEPIME <=0.12 SENSITIVE Sensitive     CEFTAZIDIME <=1 SENSITIVE Sensitive     CEFTRIAXONE <=0.25 SENSITIVE Sensitive     CIPROFLOXACIN <=0.25 SENSITIVE Sensitive     GENTAMICIN <=1 SENSITIVE Sensitive     IMIPENEM <=0.25 SENSITIVE Sensitive     TRIMETH/SULFA <=20 SENSITIVE Sensitive     AMPICILLIN/SULBACTAM <=2 SENSITIVE Sensitive     PIP/TAZO <=4 SENSITIVE Sensitive ug/mL    * FEW ESCHERICHIA COLI  MRSA Next Gen by PCR, Nasal     Status: None   Collection Time: 02/14/23  6:30 PM   Specimen: Nasal Mucosa; Nasal Swab  Result Value Ref Range Status   MRSA by PCR Next Gen NOT DETECTED NOT DETECTED Final    Comment: (NOTE) The GeneXpert MRSA Assay (FDA approved for NASAL specimens only), is one component of a comprehensive MRSA colonization surveillance program. It is  not intended to diagnose MRSA infection nor to guide or monitor treatment for MRSA infections. Test performance is not FDA  approved in patients less than 63 years old. Performed at [facility], [address], [place], [place] [address]      Scheduled Meds:  Chlorhexidine Gluconate Cloth  6 each Topical Daily   colchicine  0.6 mg Oral Daily   DULoxetine  60 mg Oral Daily   folic acid  1 mg Intravenous Daily   metoprolol tartrate  2.5 mg Intravenous Q6H   multivitamin with minerals  1 tablet Oral Daily   nicotine  14 mg Transdermal q1600   pantoprazole (PROTONIX) IV  40 mg Intravenous Q12H   spironolactone  25 mg Oral Daily   thiamine  100 mg Oral Daily   Or   thiamine  100 mg Intravenous Daily   Continuous Infusions:  heparin 2,700 Units/hr (02/18/23 1644)   piperacillin-tazobactam (ZOSYN)  IV 3.375 g (02/18/23 1457)    Procedures/Studies: DG Chest Port 1 View Result Date: 02/17/2023 CLINICAL DATA:  Nasogastric tube positioning. Recent exploratory laparotomy. EXAM: PORTABLE CHEST 1 VIEW COMPARISON:  02/14/2026 FINDINGS: Stable heart size. Nasogastric tube continues to extend into the stomach which shows some persistent gaseous distension. Correlation suggested with effectiveness of wall suction and patency of the tube itself. Additional surgical Penrose drain again visible in the upper abdomen. Stable elevation of the right hemidiaphragm. Slight increase in bibasilar atelectasis. No overt edema. No pneumothorax or significant pleural fluid. IMPRESSION: 1. Nasogastric tube continues to extend into the stomach which shows some persistent gaseous distension. Correlation suggested with effectiveness of wall suction and patency of the tube itself. 2. Slight increase in bibasilar atelectasis. Electronically Signed   By: Irish Lack M.D.   On: 02/17/2023 16:43   DG CHEST PORT 1 VIEW Result Date: 02/15/2023 CLINICAL DATA:  Enteric tube placement EXAM: PORTABLE CHEST 1 VIEW COMPARISON:   Chest radiograph dated 02/14/2023 FINDINGS: Gastric/enteric tube tip projects over the stomach. Drainage catheter is seen projecting over the left upper quadrant. Low lung volumes with bronchovascular crowding. Bibasilar patchy opacities. Enlarged cardiomediastinal silhouette is likely projectional. No definite pneumothorax or pleural effusion. IMPRESSION: 1. Gastric/enteric tube tip projects over the stomach. 2. Low lung volumes with bronchovascular crowding. Bibasilar patchy opacities, likely atelectasis. Electronically Signed   By: Agustin Cree M.D.   On: 02/15/2023 20:06   DG Chest Port 1 View Result Date: 02/14/2023 CLINICAL DATA:  NG tube placement EXAM: PORTABLE CHEST 1 VIEW COMPARISON:  02/14/2023 FINDINGS: NG tube tip is in the fundus of the stomach. Low lung volumes. Right base atelectasis. Left lung clear. Heart is normal size. No effusions. IMPRESSION: NG tube tip in the stomach. Low lung volumes.  Right base atelectasis. Electronically Signed   By: Charlett Nose M.D.   On: 02/14/2023 19:17   DG Chest Port 1 View Result Date: 02/14/2023 CLINICAL DATA:  Shortness of breath. EXAM: PORTABLE CHEST 1 VIEW COMPARISON:  02/16/2020. FINDINGS: Low lung volume. Mild pulmonary vascular congestion, which may be accentuated by low lung volume. No frank pulmonary edema. Bilateral lung fields are otherwise clear. No acute consolidation or lung collapse. Bilateral costophrenic angles are clear. Normal cardio-mediastinal silhouette. No acute osseous abnormalities. The soft tissues are within normal limits. IMPRESSION: *Mild diffuse pulmonary vascular congestion. Otherwise, no acute cardiopulmonary abnormality. Electronically Signed   By: Jules Schick M.D.   On: 02/14/2023 13:26   CT ABDOMEN PELVIS W CONTRAST Result Date: 02/14/2023 CLINICAL DATA:  Acute generalized abdominal pain. EXAM: CT ABDOMEN AND PELVIS WITH CONTRAST TECHNIQUE: Multidetector CT imaging of the abdomen and pelvis was performed using the  standard protocol following bolus administration of intravenous contrast. RADIATION DOSE REDUCTION: This exam was performed according to the departmental dose-optimization program which includes automated exposure control, adjustment of the mA and/or kV according to patient size and/or use of iterative reconstruction technique. CONTRAST:  OMNIPAQUE IOHEXOL 300 MG/ML  SOLN COMPARISON:  February 11, 2020. FINDINGS: Lower chest: No acute abnormality. Hepatobiliary: No focal liver abnormality is seen. No gallstones, gallbladder wall thickening, or biliary dilatation. Pancreas: Unremarkable. No pancreatic ductal dilatation or surrounding inflammatory changes. Spleen: Normal in size without focal abnormality. Adrenals/Urinary Tract: Adrenal glands appear normal. Stable left renal cyst is noted for which no further follow-up is required. No hydronephrosis or renal obstruction is noted. Urinary bladder is unremarkable. Stomach/Bowel: The stomach is unremarkable. The appendix appears normal. Mild amount of free air is noted in the epigastric region and in the pelvis consistent with rupture of hollow viscus. Mildly dilated and thick walled small bowel loops are noted in the pelvis and lower abdomen suggesting enteritis. There also appears to be mild sigmoid wall thickening suggesting possible diverticulitis. There is noted air between a small bowel loop and sigmoid colon, and therefore the source of perforation is not clearly identifiable. Vascular/Lymphatic: No significant vascular findings are present. No enlarged abdominal or pelvic lymph nodes. Reproductive: Prostate is unremarkable. Other: No ascites or hernia is noted. Musculoskeletal: No acute or significant osseous findings. IMPRESSION: Pneumoperitoneum is noted both in the epigastric and pelvic regions concerning for rupture of hollow viscus. Mildly dilated and inflamed small bowel loops are noted in lower abdomen and pelvis as well as mild sigmoid wall  thickening suggesting possible inflammation there is well. It is uncertain if the pneumoperitoneum is secondary to ruptured small or large bowel. Critical Value/emergent results were called by telephone at the time of interpretation on 02/14/2023 at 1:22 pm to provider JOSEPH ZAMMIT , who verbally acknowledged these results. Electronically Signed   By: Lupita Raider M.D.   On: 02/14/2023 13:24   ECHOCARDIOGRAM COMPLETE Result Date: 01/26/2023    ECHOCARDIOGRAM REPORT   Patient Name:   RYYAN AREDONDO Date of Exam: 01/26/2023 Medical Rec #:  [id number]           Height:       74.0 in Accession #:    [id number]          Weight:       247.0 lb Date of Birth:  January 10, 1976          BSA:          2.377 m Patient Age:    47 years            BP:           122/99 mmHg Patient Gender: M                   HR:           72 bpm. [facility] Procedure: 2D Echo, 3D Echo, Cardiac Doppler, Color Doppler and Strain Analysis Indications:    I50.40* Unspecified combined systolic (congestive) and diastolic                 (congestive) heart failure  History:        Patient has prior history of Echocardiogram examinations, most                 recent 02/16/2020. Cardiomyopathy and CHF,  Signs/Symptoms:Shortness of Breath, Dyspnea and Fatigue; Risk                 Factors:Hypertension and Dyslipidemia. Pulmonary embolus.  Sonographer:    Sheralyn Boatman RDCS Referring Phys: 63 TESSA N CONTE  Sonographer Comments: Image acquisition challenging due to patient body habitus and Image acquisition challenging due to respiratory motion. IMPRESSIONS  1. Left ventricular ejection fraction, by estimation, is 50 to 55%. The left ventricle has low normal function. The left ventricle has no regional wall motion abnormalities. The left ventricular internal cavity size was mildly dilated. There is mild concentric left ventricular hypertrophy. Left ventricular diastolic parameters are consistent with Grade I diastolic  dysfunction (impaired relaxation). The average left ventricular global longitudinal strain is -19.3 %. The global longitudinal strain is normal.  2. Right ventricular systolic function is normal. The right ventricular size is normal. Tricuspid regurgitation signal is inadequate for assessing PA pressure.  3. The mitral valve is grossly normal. Mild mitral valve regurgitation.  4. The aortic valve is tricuspid. Aortic valve regurgitation is not visualized. No aortic stenosis is present. Aortic valve mean gradient measures 3.0 mmHg.  5. Aortic dilatation noted. There is moderate dilatation of the aortic root, measuring 44 mm.  6. The inferior vena cava is normal in size with greater than 50% respiratory variability, suggesting right atrial pressure of 3 mmHg. Comparison(s): Prior images reviewed side by side. LVEF improved significantly in comparison, now 50-55% range. Mitral regurgitation is mild. Moderately dilated aortic root. FINDINGS  Left Ventricle: Left ventricular ejection fraction, by estimation, is 50 to 55%. The left ventricle has low normal function. The left ventricle has no regional wall motion abnormalities. The average left ventricular global longitudinal strain is -19.3 %. The global longitudinal strain is normal. The left ventricular internal cavity size was mildly dilated. There is mild concentric left ventricular hypertrophy. Left ventricular diastolic parameters are consistent with Grade I diastolic dysfunction (impaired relaxation). Right Ventricle: The right ventricular size is normal. No increase in right ventricular wall thickness. Right ventricular systolic function is normal. Tricuspid regurgitation signal is inadequate for assessing PA pressure. Left Atrium: Left atrial size was normal in size. Right Atrium: Right atrial size was normal in size. Pericardium: There is no evidence of pericardial effusion. Presence of epicardial fat layer. Mitral Valve: The mitral valve is grossly normal. Mild  mitral valve regurgitation. Tricuspid Valve: The tricuspid valve is grossly normal. Tricuspid valve regurgitation is trivial. Aortic Valve: The aortic valve is tricuspid. There is mild aortic valve annular calcification. Aortic valve regurgitation is not visualized. No aortic stenosis is present. Aortic valve mean gradient measures 3.0 mmHg. Aortic valve peak gradient measures 5.1 mmHg. Aortic valve area, by VTI measures 3.51 cm. Pulmonic Valve: The pulmonic valve was grossly normal. Pulmonic valve regurgitation is trivial. Aorta: Aortic dilatation noted. There is moderate dilatation of the aortic root, measuring 44 mm. Venous: The inferior vena cava is normal in size with greater than 50% respiratory variability, suggesting right atrial pressure of 3 mmHg. IAS/Shunts: No atrial level shunt detected by color flow Doppler.  LEFT VENTRICLE PLAX 2D LVIDd:         6.20 cm      Diastology LVIDs:         5.20 cm      LV e' medial:    4.46 cm/s LV PW:         1.10 cm      LV E/e' medial:  11.1 LV IVS:  0.90 cm      LV e' lateral:   8.70 cm/s LVOT diam:     2.40 cm      LV E/e' lateral: 5.7 LV SV:         72 LV SV Index:   30           2D Longitudinal Strain LVOT Area:     4.52 cm     2D Strain GLS Avg:     -19.3 %  LV Volumes (MOD) LV vol d, MOD A2C: 138.0 ml 3D Volume EF: LV vol d, MOD A4C: 113.0 ml 3D EF:        51 % LV vol s, MOD A2C: 62.5 ml  LV EDV:       214 ml LV vol s, MOD A4C: 55.6 ml  LV ESV:       105 ml LV SV MOD A2C:     75.5 ml  LV SV:        109 ml LV SV MOD A4C:     113.0 ml LV SV MOD BP:      66.7 ml RIGHT VENTRICLE RV S prime:     10.90 cm/s TAPSE (M-mode): 2.1 cm LEFT ATRIUM             Index        RIGHT ATRIUM           Index LA diam:        2.30 cm 0.97 cm/m   RA Area:     11.90 cm LA Vol (A2C):   71.1 ml 29.91 ml/m  RA Volume:   26.30 ml  11.06 ml/m LA Vol (A4C):   37.9 ml 15.94 ml/m LA Biplane Vol: 54.7 ml 23.01 ml/m  AORTIC VALVE AV Area (Vmax):    3.35 cm AV Area (Vmean):   3.31 cm  AV Area (VTI):     3.51 cm AV Vmax:           113.00 cm/s AV Vmean:          73.300 cm/s AV VTI:            0.206 m AV Peak Grad:      5.1 mmHg AV Mean Grad:      3.0 mmHg LVOT Vmax:         83.70 cm/s LVOT Vmean:        53.600 cm/s LVOT VTI:          0.160 m LVOT/AV VTI ratio: 0.78  AORTA Ao Root diam: 4.40 cm Ao Asc diam:  3.65 cm MITRAL VALVE MV Area (PHT): 3.21 cm    SHUNTS MV Decel Time: 236 msec    Systemic VTI:  0.16 m MV E velocity: 49.70 cm/s  Systemic Diam: 2.40 cm MV A velocity: 72.80 cm/s MV E/A ratio:  0.68 Nona Dell MD Electronically signed by Nona Dell MD Signature Date/Time: 01/26/2023/4:11:39 PM    Final     Catarina Hartshorn, DO  [facility]  If 7PM-7AM, please contact night-coverage [email or web address] Password [email or web address] 02/18/2023, 5:18 PM   LOS: 4 days

## 2023-02-18 NOTE — Progress Notes (Signed)
[facility]  Bloody NG drainage not unexpected. H&H stable /rising. Expect some given the suction and trauma from replacements.   BP 134/88 (BP Location: Left Arm)   Pulse 92   Temp 98.5 F (36.9 C) (Oral)   Resp 18   Ht 6\' 2"  (1.88 m)   Wt 110.6 kg   SpO2 97%   BMI 31.31 kg/m  Soft, mildly distended, tender appropriately, midline with staples, no erythema or drainage extending out, minor staple associated redness at the staple itself, ABD replace JP with SS output, more out of epigastric JP than pelvic   H&H up WBC down   Mr. Wafer s/p Ex lap for pneumoperitoneum, unknown source, drain placement X 2. PRN narcotics IS, OOB CIWA HR and BP improved NPO, NG and ileus Awaiting bowel function Can have one sprite and some water today for comfort as long as NG remains in place and suctioning out  JP in place  H&H stabilized and WBC normal, Zosyn for  5 days post op, ending 12/29  SCDs, ambulate, heparin gtt   Sent Dr. Marletta Lor message about Egd and colonoscopy needed. CT before any feeding, awaiting bowel function before CT ordered. Can have hard candy and gum.   Algis Greenhouse, MD [facility] [address] [place], [place] [address] [phone] (office)

## 2023-02-18 NOTE — Progress Notes (Signed)
Progress Note  RN called due to noticing blood/coffee-ground colored fluid in NG tube which was suspected to be blood. Gastric occult blood was ordered.  It was reported that patient started to have similar secretion from NGT yesterday during the day shift and that it was deemed to be due to trauma from pulling out the NG tube.  Morning lab is pending to compare downtrending of hemoglobin, we shall continue to monitor H/H and treat accordingly.   Patient continues to remain stable General surgery is already on board.  BP 134/88 (BP Location: Left Arm)   Pulse 92   Temp 98.5 F (36.9 C) (Oral)   Resp 18   Ht 6\' 2"  (1.88 m)   Wt 110.6 kg   SpO2 97%   BMI 31.31 kg/m   Assessment and plan  Possible GI bleed Continue to monitor H/H and consider blood transfusion for hemoglobin < 7.0 Patient is currently stable and asymptomatic  Total time:  33 minutes This includes time reviewing the chart including progress notes, labs, EKGs, taking medical decisions, ordering labs and documenting findings.

## 2023-02-19 ENCOUNTER — Inpatient Hospital Stay (HOSPITAL_COMMUNITY): Payer: BC Managed Care – PPO

## 2023-02-19 DIAGNOSIS — K6389 Other specified diseases of intestine: Secondary | ICD-10-CM | POA: Diagnosis not present

## 2023-02-19 DIAGNOSIS — A419 Sepsis, unspecified organism: Secondary | ICD-10-CM | POA: Diagnosis not present

## 2023-02-19 DIAGNOSIS — R198 Other specified symptoms and signs involving the digestive system and abdomen: Secondary | ICD-10-CM | POA: Diagnosis not present

## 2023-02-19 DIAGNOSIS — R140 Abdominal distension (gaseous): Secondary | ICD-10-CM | POA: Diagnosis not present

## 2023-02-19 DIAGNOSIS — I5022 Chronic systolic (congestive) heart failure: Secondary | ICD-10-CM | POA: Diagnosis not present

## 2023-02-19 DIAGNOSIS — F419 Anxiety disorder, unspecified: Secondary | ICD-10-CM | POA: Diagnosis not present

## 2023-02-19 DIAGNOSIS — D6869 Other thrombophilia: Secondary | ICD-10-CM | POA: Diagnosis not present

## 2023-02-19 DIAGNOSIS — R109 Unspecified abdominal pain: Secondary | ICD-10-CM | POA: Diagnosis not present

## 2023-02-19 DIAGNOSIS — M0600 Rheumatoid arthritis without rheumatoid factor, unspecified site: Secondary | ICD-10-CM | POA: Diagnosis not present

## 2023-02-19 DIAGNOSIS — Z9889 Other specified postprocedural states: Secondary | ICD-10-CM | POA: Diagnosis not present

## 2023-02-19 LAB — CBC WITH DIFFERENTIAL/PLATELET
Abs Immature Granulocytes: 0.07 10*3/uL (ref 0.00–0.07)
Basophils Absolute: 0 10*3/uL (ref 0.0–0.1)
Basophils Relative: 1 %
Eosinophils Absolute: 0.4 10*3/uL (ref 0.0–0.5)
Eosinophils Relative: 6 %
HCT: 32.9 % — ABNORMAL LOW (ref 39.0–52.0)
Hemoglobin: 11.5 g/dL — ABNORMAL LOW (ref 13.0–17.0)
Immature Granulocytes: 1 %
Lymphocytes Relative: 21 %
Lymphs Abs: 1.3 10*3/uL (ref 0.7–4.0)
MCH: 33.7 pg (ref 26.0–34.0)
MCHC: 35 g/dL (ref 30.0–36.0)
MCV: 96.5 fL (ref 80.0–100.0)
Monocytes Absolute: 0.5 10*3/uL (ref 0.1–1.0)
Monocytes Relative: 8 %
Neutro Abs: 4 10*3/uL (ref 1.7–7.7)
Neutrophils Relative %: 63 %
Platelets: 205 10*3/uL (ref 150–400)
RBC: 3.41 MIL/uL — ABNORMAL LOW (ref 4.22–5.81)
RDW: 13.2 % (ref 11.5–15.5)
WBC: 6.2 10*3/uL (ref 4.0–10.5)
nRBC: 0 % (ref 0.0–0.2)

## 2023-02-19 LAB — COMPREHENSIVE METABOLIC PANEL
ALT: 29 U/L (ref 0–44)
AST: 30 U/L (ref 15–41)
Albumin: 2.4 g/dL — ABNORMAL LOW (ref 3.5–5.0)
Alkaline Phosphatase: 37 U/L — ABNORMAL LOW (ref 38–126)
Anion gap: 11 (ref 5–15)
BUN: 7 mg/dL (ref 6–20)
CO2: 23 mmol/L (ref 22–32)
Calcium: 8 mg/dL — ABNORMAL LOW (ref 8.9–10.3)
Chloride: 102 mmol/L (ref 98–111)
Creatinine, Ser: 0.7 mg/dL (ref 0.61–1.24)
GFR, Estimated: >60 mL/min (ref >60)
Glucose, Bld: 78 mg/dL (ref 70–99)
Potassium: 3.4 mmol/L — ABNORMAL LOW (ref 3.5–5.1)
Sodium: 136 mmol/L (ref 135–145)
Total Bilirubin: 1.5 mg/dL — ABNORMAL HIGH (ref <1.2)
Total Protein: 5.5 g/dL — ABNORMAL LOW (ref 6.5–8.1)

## 2023-02-19 LAB — PHOSPHORUS: Phosphorus: 2.6 mg/dL (ref 2.5–4.6)

## 2023-02-19 LAB — CULTURE, BLOOD (ROUTINE X 2)
Culture: NO GROWTH
Culture: NO GROWTH
Special Requests: ADEQUATE
Special Requests: ADEQUATE

## 2023-02-19 LAB — HEPARIN LEVEL (UNFRACTIONATED): Heparin Unfractionated: 0.56 [IU]/mL (ref 0.30–0.70)

## 2023-02-19 LAB — MAGNESIUM: Magnesium: 1.9 mg/dL (ref 1.7–2.4)

## 2023-02-19 MED ORDER — POTASSIUM PHOSPHATES 15 MMOLE/5ML IV SOLN
30.0000 mmol | Freq: Once | INTRAVENOUS | Status: AC
  Administered 2023-02-20: 30 mmol via INTRAVENOUS
  Filled 2023-02-19: qty 10

## 2023-02-19 MED ORDER — BISACODYL 10 MG RE SUPP
10.0000 mg | Freq: Once | RECTAL | Status: AC
  Administered 2023-02-19: 10 mg via RECTAL
  Filled 2023-02-19: qty 1

## 2023-02-19 MED ORDER — TRAZODONE HCL 50 MG PO TABS
50.0000 mg | ORAL_TABLET | Freq: Every evening | ORAL | Status: DC | PRN
  Administered 2023-02-19 – 2023-02-20 (×2): 50 mg via ORAL
  Filled 2023-02-19 (×2): qty 1

## 2023-02-19 NOTE — Progress Notes (Signed)
Patient had 900 ml recorded under stool from yesterday. Per patient, he has only been able to pass gas; and patient did have a lot of reported ng output yesterday. Ng tube is hooked to suction and working well. Patient with complaints of pain; eased somewhat with ordered medications. Dressing removed and left off for the time being by Dr. Henreitta Leber. To be redressed next pain medications administration. Patient waiting to walk with his wife. Patient alert/oriented and able to make needs known.

## 2023-02-19 NOTE — Progress Notes (Addendum)
[facility]  No BM despite recorded 900 (RN says misrecorded for NG). Did have flatus. KUB with significant gas in the bowel but also in the colon. Hopefully starting to move things through.  BP 134/89   Pulse 79   Temp 98.2 F (36.8 C) (Oral)   Resp 20   Ht 6\' 2"  (1.88 m)   Wt 110.3 kg   SpO2 97%   BMI 31.21 kg/m  Soft, mildly distended, midline with staples c/d/I with no erythema or drainage JP with SS output    Patient s/p Ex lap for pneumoperitoneum, unknown source, drain placement X 2. PRN narcotics IS, OOB NPO, NG and ileus Awaiting bowel function Suppository this AM  Can have one sprite and some water today for comfort as long as NG remains in place and suctioning out  JPs in place  H&H drifted slightly but nothing major and WBC normal, no shift Zosyn for  5 days post op, ending 12/29  SCDs, ambulate, heparin gtt   Will need Egd and colonoscopyas outpatient.  CT before any feeding, awaiting bowel function before CT ordered. Can have hard candy and gum.   Kub with gas in bowel consistent with ileus, moving into the colon so hopefully resolving   Algis Greenhouse, MD [facility] [address] [place], [place] [address] [phone] (office)

## 2023-02-19 NOTE — Plan of Care (Signed)

## 2023-02-19 NOTE — Consult Note (Signed)
.  Pharmacy Consult Note - Anticoagulation  Pharmacy Consult for Heparin Indication:  history of pulmonary embolism  PATIENT MEASUREMENTS: Height: 6\' 2"  (188 cm) Weight: 110.3 kg (243 lb 1.6 oz) IBW/kg (Calculated) : 82.2 HEPARIN DW (KG): 95.8  VITAL SIGNS: Temp: 98.2 F (36.8 C) (12/29 0645) Temp Source: Oral (12/29 0645) BP: 134/89 (12/29 0645) Pulse Rate: 79 (12/29 0645)  Recent Labs    02/19/23 0348  HGB 11.5*  HCT 32.9*  PLT 205  HEPARINUNFRC 0.56  CREATININE 0.70    Estimated Creatinine Clearance: 150.8 mL/min (by C-G formula based on SCr of 0.7 mg/dL).  PAST MEDICAL HISTORY: Past Medical History:  Diagnosis Date   Asthma    AS A CHILD ONLY   DVT (deep venous thrombosis) (HCC)    Foot pain, right    painful x 1 month and swelling   Idiopathic gout     ASSESSMENT: 47 y.o. male with PMH including pulmonary embolism in 2023 is presenting with a bowel perforation, and is now s/p exploratory laparotomy. Patient is on chronic anticoagulation with Eliquis. Last dose was 02/13/2023 in the evening. Patient received Kcentra on 12/24 due to need for emergent procedure. Pharmacy has been consulted to initiate and manage heparin intravenous infusion.  Date Time HL Rate/Comment 12/27 0031 0.17 Subtherapeutic 12/27 1009 0.36 Therapeutic x 1 12/27 1713 0.48 Therapeutic x 2 12/28 0406 0.40 Therapeutic 12/29 0348 0.56 Therapeutic  Goal(s) of therapy: Heparin level 0.3-0.7 units/mL Monitor platelets by anticoagulation protocol: Yes   PLAN: Continue heparin infusion at 2700 units/hr Heparin level next with tomorrow AM labs Caution regarding boluses due to recent surgery   Will M. Dareen Piano, PharmD Clinical Pharmacist 02/19/2023 9:01 AM

## 2023-02-19 NOTE — Plan of Care (Signed)
  Problem: Education: Goal: Knowledge of General Education information will improve Description: Including pain rating scale, medication(s)/side effects and non-pharmacologic comfort measures Outcome: Progressing   Problem: Health Behavior/Discharge Planning: Goal: Ability to manage health-related needs will improve Outcome: Progressing   Problem: Clinical Measurements: Goal: Ability to maintain clinical measurements within normal limits will improve Outcome: Progressing Goal: Will remain free from infection Outcome: Progressing Goal: Diagnostic test results will improve Outcome: Progressing Goal: Respiratory complications will improve Outcome: Progressing Goal: Cardiovascular complication will be avoided Outcome: Progressing   Problem: Activity: Goal: Risk for activity intolerance will decrease Outcome: Progressing   Problem: Nutrition: Goal: Adequate nutrition will be maintained Outcome: Progressing   Problem: Coping: Goal: Level of anxiety will decrease Outcome: Progressing   Problem: Elimination: Goal: Will not experience complications related to bowel motility Outcome: Progressing Goal: Will not experience complications related to urinary retention Outcome: Progressing   Problem: Pain Management: Goal: General experience of comfort will improve Outcome: Progressing

## 2023-02-19 NOTE — Progress Notes (Addendum)
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  He presented from home by EMS complaining of severe generalized abdominal pain with nausea vomiting and diarrhea that started early this morning.  The pain is 10/10.  He feels very ill and having palpitations.  He denies fever and chills.  In the ED he was noted to have a rigid abdomen and sent for CT abdomen pelvis with findings of concern for a perforated abdominal viscus.  Emergent surgical consultation requested.  He is started on IV antibiotics and IV fluids and admission requested for further management. The patient underwent exploratory laparotomy for his pneumoperitoneum on 02/14/2023.  A JP drain was placed over the stomach and into the pelvis.  General surgery continue to follow-up for postoperative care.  His hospitalization has been prolonged secondary to patient removal of his NG tube and postoperative ileus. NG tube was replaced on 02/17/2023 putting out 1500 cc.   Assessment/Plan: Sepsis from intraabdominal infection/pneumoperitoneum - pt emergently taken to OR on 12/24 by Dr. Henreitta Leber for exploratory lap -sepsis physiology resolving and markedly improved  -Continue Zosyn -Continue IV fluids -Lactic acid 2.9>> 1.4 - lactic acidosis resolved    Perforated abdominal viscous with pneumoperitoneum/postoperative ileus - Dr. Henreitta Leber took him to OR emergently after Kcentra  treatment on 12/24  - surgery planning for CT abdomen/pelvis with oral contrast when bowel function returns - continue supportive measures - NG came out x2 -NG tube replaced 02/17/2023 with 1500 cc output -Changes  medications to IV when possible -may clamp NG short time to allow for ambulation hallway -12/29--tiny BM, small flatus;  KUB with gas in colon   History of PE/ Acquired thrombophilia -Patient diagnosed with PE and right lower extremity DVT 01/2020 -Felt to be secondary to underlying COVID-19 infection - last dose of apixaban was taken 12/23 at 6pm - K-centra was given 12/24   - IV heparin started no bolus 12/25 per surgery  -Monitor hemoglobin>>remains stable   Chronic HFrEF -02/05/2023 echo EF 55%, no WMA, normal RVF, grade 1 DD - 02/16/2020 echo EF < 20 % -Initially felt to be secondary to alcohol abuse -Entresto on hold until patient able to tolerate p.o. -Holding Jardiance secondary to acute infectious process -Metoprolol succinate transition to IV Lopressor until able to tolerate p.o. -Restart spironolactone when able to tolerate p.o. -Appears clinically euvolemic   Leukemoid reaction - secondary to abdominal infection and surgery - following blood cultures x 2 : no growth to date  - unfortunately IV ceftriaxone had already been given prior to blood cultures - WBC improved   Hypomagnesemia - IV replacement ordered and repleted    Depression/GAD / alcohol dependence - IV lorazepam ordered per CIWA protocol  - supplemental vitamins per CIWA protocol  -No signs of alcohol withdrawal - will try to give cymbalta thru NG tube if possible -Continue folic acid and thiamine   Essential hypertension -Patient was on Entresto and metoprolol succinate prior to admission -Continue IV Lopressor for  now   Normocytic anemia  - continue daily morning CBC    Hyperbilirubinemia - likely secondary to congestive changes in the setting of alcohol dependence   Generalized  abdominal pain  - severe pain being treated with IV medications - IV hydromorphone ordered as needed    Rheumatoid arthritis  - he is relatively immunocompromised - his rheumatologist has him on prednisone   Tobacco user - nicotine patch ordered    Sinus tachycardia  - likely having beta blocker rebound tachycardia  - IV lopressor while NPO with hold parameters - resume home oral metoprolol XL 150 mg when able to take p.o. better   Hypokalemia -Repleted -Check magnesium 1.9         Family Communication:   spouse at bedside 12/27   Consultants:  general surgery   Code Status:  FULL    DVT Prophylaxis:  IV Heparin     Procedures: As Listed in Progress Note Above   Antibiotics: Zosyn 12/25>>12/29            Subjective: Had small amount of flatus and tiny BM.  Abd pain controlled with dilaudid.  Denies f/c cp, sob, n/v  Objective: Vitals:   02/18/23 2009 02/19/23 0500 02/19/23 0645 02/19/23 1354  BP: (!) 132/91  134/89 130/79  Pulse: 90  79 82  Resp:    18  Temp: 98.2 F (36.8 C)  98.2 F (36.8 C) 98.4 F (36.9 C)  TempSrc: Oral  Oral Oral  SpO2: 96%  97% 95%  Weight:  110.3 kg    Height:        Intake/Output Summary (Last 24 hours) at 02/19/2023 1748 Last data filed at 02/19/2023 1500 Gross per 24 hour  Intake 2839.4 ml  Output 3475 ml  Net -635.6 ml   Weight change: -0.331 kg Exam:  General:  Pt is alert, follows commands appropriately, not in acute distress HEENT: No icterus, No thrush, No neck mass, [place]/AT Cardiovascular: RRR, S1/S2, no rubs, no gallops Respiratory: CTA bilaterally, no wheezing, no crackles, no rhonchi Abdomen: Soft/+BS, diffusely tender, non distended, no guarding Extremities: No edema, No lymphangitis, No petechiae, No rashes, no synovitis   Data Reviewed: I have personally reviewed following labs and imaging studies Basic Metabolic Panel: Recent Labs  Lab 02/15/23 0513 02/16/23 0039 02/17/23 0031  02/18/23 0406 02/19/23 0348  NA 139 135 136 135 136  K 3.8 3.6 3.2* 3.3* 3.4*  CL 107 101 102 101 102  CO2 24 26 24 23 23   GLUCOSE 121* 103* 86 84 78  BUN 19 14 10 9 7   CREATININE 0.82 1.02 0.77 0.73 0.70  CALCIUM 7.8* 7.9* 8.2* 8.3* 8.0*  MG 1.6* 2.4  --  1.9 1.9  PHOS 4.3  --   --  2.8 2.6   Liver Function Tests: Recent Labs  Lab 02/15/23 0513 02/16/23 0039 02/17/23 0031 02/18/23 0406 02/19/23 0348  AST 23 26 21 26 30   ALT 25 29 26 26 29   ALKPHOS 31* 42 40 45 37*  BILITOT 1.2* 1.4* 1.7* 1.6* 1.5*  PROT 5.1* 5.3* 5.3* 5.8* 5.5*  ALBUMIN 2.8* 2.6* 2.6* 2.9* 2.4*   Recent Labs  Lab 02/14/23 1206  LIPASE 42   No results for input(s): "AMMONIA" in the last 168 hours. Coagulation Profile: Recent Labs  Lab 02/15/23 1729  INR 1.5*   CBC: Recent Labs  Lab 02/15/23 0513 02/16/23 0039 02/16/23 1221 02/17/23 0031 02/18/23 0406 02/18/23 1042 02/19/23 0348  WBC 14.2* 13.3* 9.4 9.0 7.9  --  6.2  NEUTROABS 11.9* 10.5*  --  6.9 5.8  --  4.0  HGB 15.1 12.7* 11.7* 11.4* 12.8* 12.2* 11.5*  HCT 42.5 37.5* 33.7* 33.5* 38.1* 34.6* 32.9*  MCV 96.8 97.9 98.8 98.0 97.7  --  96.5  PLT 179 160 153 157 208  --  205   Cardiac Enzymes: No results for input(s): "CKTOTAL", "CKMB", "CKMBINDEX", "TROPONINI" in the last 168 hours. BNP: Invalid input(s): "POCBNP" CBG: No results for input(s): "GLUCAP" in the last 168 hours. HbA1C: No results for input(s): "HGBA1C" in the last 72 hours. Urine analysis:    Component Value Date/Time   COLORURINE YELLOW 02/14/2023 1246   APPEARANCEUR CLEAR 02/14/2023 1246   LABSPEC 1.027 02/14/2023 1246   PHURINE 5.0 02/14/2023 1246   GLUCOSEU >=500 (A) 02/14/2023 1246   HGBUR NEGATIVE 02/14/2023 1246   BILIRUBINUR NEGATIVE 02/14/2023 1246   KETONESUR NEGATIVE 02/14/2023 1246   PROTEINUR NEGATIVE 02/14/2023 1246   NITRITE NEGATIVE 02/14/2023 1246   LEUKOCYTESUR NEGATIVE 02/14/2023 1246   Sepsis  Labs: @LABRCNTIP (procalcitonin:4,lacticidven:4) ) Recent Results (from the past 240 hours)  Resp panel by RT-PCR (RSV, Flu A&B, Covid) Anterior Nasal Swab     Status: None   Collection Time: 02/14/23  1:18 PM   Specimen: Anterior Nasal Swab  Result Value Ref Range Status   SARS Coronavirus 2 by RT PCR NEGATIVE NEGATIVE Final    Comment: (NOTE) SARS-CoV-2 target nucleic acids are NOT DETECTED.  The SARS-CoV-2 RNA is generally detectable in upper respiratory specimens during the acute phase of infection. The lowest concentration of SARS-CoV-2 viral copies this assay can detect is 138 copies/mL. A negative result does not preclude SARS-Cov-2 infection and should not be used as the sole basis for treatment or other patient management decisions. A negative result may occur with  improper specimen collection/handling, submission of specimen other than nasopharyngeal swab, presence of viral mutation(s) within the areas targeted by this assay, and inadequate number of viral copies(<138 copies/mL). A negative result must be combined with clinical observations, patient history, and epidemiological information. The expected result is Negative.  Fact Sheet for Patients:  BloggerCourse.com  Fact Sheet for Healthcare Providers:  SeriousBroker.it  This test is no t yet approved or cleared by the [place] FDA and  has been authorized for detection and/or diagnosis of SARS-CoV-2 by FDA under an Emergency Use Authorization (EUA). This EUA will remain  in effect (meaning this test can be used) for the duration of the COVID-19 declaration under Section 564(b)(1) of the Act, 21 U.S.C.section 360bbb-3(b)(1), unless the authorization is terminated  or revoked sooner.       Influenza A by PCR NEGATIVE NEGATIVE Final   Influenza B by PCR NEGATIVE NEGATIVE Final    Comment: (NOTE) The Xpert Xpress SARS-CoV-2/FLU/RSV plus assay is intended as an  aid in the diagnosis of influenza from Nasopharyngeal swab specimens and should not be used as a sole basis for treatment. Nasal washings and aspirates are unacceptable for Xpert Xpress SARS-CoV-2/FLU/RSV testing.  Fact Sheet for Patients: BloggerCourse.com  Fact Sheet for Healthcare Providers: SeriousBroker.it  This test is not yet approved or cleared by the [place] FDA and has been authorized for detection and/or diagnosis of SARS-CoV-2 by FDA under an Emergency Use Authorization (EUA). This EUA will remain in effect (meaning this test can be used) for the duration of the COVID-19 declaration under Section 564(b)(1) of the Act, 21 U.S.C. section 360bbb-3(b)(1), unless the authorization is terminated or revoked.     Resp Syncytial  Virus by PCR NEGATIVE NEGATIVE Final    Comment: (NOTE) Fact Sheet for Patients: BloggerCourse.com  Fact Sheet for Healthcare Providers: SeriousBroker.it  This test is not yet approved or cleared by the [place] FDA and has been authorized for detection and/or diagnosis of SARS-CoV-2 by FDA under an Emergency Use Authorization (EUA). This EUA will remain in effect (meaning this test can be used) for the duration of the COVID-19 declaration under Section 564(b)(1) of the Act, 21 U.S.C. section 360bbb-3(b)(1), unless the authorization is terminated or revoked.  Performed at [facility], [address] [place], [place] [address]   Culture, blood (Routine X 2) w Reflex to ID Panel     Status: None   Collection Time: 02/14/23  2:20 PM   Specimen: Right Antecubital; Blood  Result Value Ref Range Status   Specimen Description RIGHT ANTECUBITAL  Final   Special Requests   Final    BOTTLES DRAWN AEROBIC AND ANAEROBIC Blood Culture adequate volume   Culture   Final    NO GROWTH 5 DAYS Performed at [facility], [address] [place],  [place] [address]    Report Status 02/19/2023 FINAL  Final  Culture, blood (Routine X 2) w Reflex to ID Panel     Status: None   Collection Time: 02/14/23  2:56 PM   Specimen: BLOOD  Result Value Ref Range Status   Specimen Description BLOOD BLOOD RIGHT FOREARM  Final   Special Requests   Final    BOTTLES DRAWN AEROBIC AND ANAEROBIC Blood Culture adequate volume   Culture   Final    NO GROWTH 5 DAYS Performed at [facility], [address] [place], [place] [address]    Report Status 02/19/2023 FINAL  Final  Aerobic/Anaerobic Culture w Gram Stain (surgical/deep wound)     Status: None   Collection Time: 02/14/23  4:17 PM   Specimen: Path fluid; Body Fluid  Result Value Ref Range Status   Specimen Description WOUND  Final   Special Requests PURLANT ASCITES  Final   Gram Stain   Final    ABUNDANT WBC PRESENT, PREDOMINANTLY PMN RARE GRAM NEGATIVE RODS    Culture   Final    FEW ESCHERICHIA COLI FEW BACTEROIDES SPECIES BETA LACTAMASE POSITIVE Performed at [facility], [address] [place], [place] [address]    Report Status 02/17/2023 FINAL  Final   Organism ID, Bacteria ESCHERICHIA COLI  Final      Susceptibility   Escherichia coli - MIC*    AMPICILLIN 4 SENSITIVE Sensitive     CEFEPIME <=0.12 SENSITIVE Sensitive     CEFTAZIDIME <=1 SENSITIVE Sensitive     CEFTRIAXONE <=0.25 SENSITIVE Sensitive     CIPROFLOXACIN <=0.25 SENSITIVE Sensitive     GENTAMICIN <=1 SENSITIVE Sensitive     IMIPENEM <=0.25 SENSITIVE Sensitive     TRIMETH/SULFA <=20 SENSITIVE Sensitive     AMPICILLIN/SULBACTAM <=2 SENSITIVE Sensitive     PIP/TAZO <=4 SENSITIVE Sensitive ug/mL    * FEW ESCHERICHIA COLI  MRSA Next Gen by PCR, Nasal     Status: None   Collection Time: 02/14/23  6:30 PM   Specimen: Nasal Mucosa; Nasal Swab  Result Value Ref Range Status   MRSA by PCR Next Gen NOT DETECTED NOT DETECTED Final    Comment: (NOTE) The GeneXpert MRSA Assay (FDA approved for NASAL specimens only), is one  component of a comprehensive MRSA colonization surveillance program. It is not intended to diagnose MRSA infection nor to guide  or monitor treatment for MRSA infections. Test performance is not FDA approved in patients less than 75 years old. Performed at [facility], [address], [place], [place] [address]      Scheduled Meds:  Chlorhexidine Gluconate Cloth  6 each Topical Daily   colchicine  0.6 mg Oral Daily   DULoxetine  60 mg Oral Daily   folic acid  1 mg Intravenous Daily   metoprolol tartrate  2.5 mg Intravenous Q6H   multivitamin with minerals  1 tablet Oral Daily   nicotine  14 mg Transdermal q1600   pantoprazole (PROTONIX) IV  40 mg Intravenous Q12H   spironolactone  25 mg Oral Daily   thiamine  100 mg Oral Daily   Or   thiamine  100 mg Intravenous Daily   Continuous Infusions:  heparin 2,700 Units/hr (02/19/23 1245)   lactated ringers 50 mL/hr at 02/19/23 0336   piperacillin-tazobactam (ZOSYN)  IV 3.375 g (02/19/23 1313)    Procedures/Studies: DG Abd 1 View Result Date: 02/19/2023 CLINICAL DATA:  Abdominal pain and distention. Postop from exploratory laparotomy. EXAM: ABDOMEN - 1 VIEW COMPARISON:  None Available. FINDINGS: Surgical drain seen in the upper abdomen and pelvis, as well as midline skin staples. Mild diffuse dilatation of small bowel loops is seen as well as mild gaseous distention of the transverse colon. This likely represents a postop ileus, with partial small bowel obstruction considered less likely. IMPRESSION: Probable postop ileus, with partial small bowel obstruction considered less likely. Electronically Signed   By: Danae Orleans M.D.   On: 02/19/2023 13:04   DG Chest Port 1 View Result Date: 02/17/2023 CLINICAL DATA:  Nasogastric tube positioning. Recent exploratory laparotomy. EXAM: PORTABLE CHEST 1 VIEW COMPARISON:  02/14/2026 FINDINGS: Stable heart size. Nasogastric tube continues to extend into the stomach which shows some persistent gaseous  distension. Correlation suggested with effectiveness of wall suction and patency of the tube itself. Additional surgical Penrose drain again visible in the upper abdomen. Stable elevation of the right hemidiaphragm. Slight increase in bibasilar atelectasis. No overt edema. No pneumothorax or significant pleural fluid. IMPRESSION: 1. Nasogastric tube continues to extend into the stomach which shows some persistent gaseous distension. Correlation suggested with effectiveness of wall suction and patency of the tube itself. 2. Slight increase in bibasilar atelectasis. Electronically Signed   By: Irish Lack M.D.   On: 02/17/2023 16:43   DG CHEST PORT 1 VIEW Result Date: 02/15/2023 CLINICAL DATA:  Enteric tube placement EXAM: PORTABLE CHEST 1 VIEW COMPARISON:  Chest radiograph dated 02/14/2023 FINDINGS: Gastric/enteric tube tip projects over the stomach. Drainage catheter is seen projecting over the left upper quadrant. Low lung volumes with bronchovascular crowding. Bibasilar patchy opacities. Enlarged cardiomediastinal silhouette is likely projectional. No definite pneumothorax or pleural effusion. IMPRESSION: 1. Gastric/enteric tube tip projects over the stomach. 2. Low lung volumes with bronchovascular crowding. Bibasilar patchy opacities, likely atelectasis. Electronically Signed   By: Agustin Cree M.D.   On: 02/15/2023 20:06   DG Chest Port 1 View Result Date: 02/14/2023 CLINICAL DATA:  NG tube placement EXAM: PORTABLE CHEST 1 VIEW COMPARISON:  02/14/2023 FINDINGS: NG tube tip is in the fundus of the stomach. Low lung volumes. Right base atelectasis. Left lung clear. Heart is normal size. No effusions. IMPRESSION: NG tube tip in the stomach. Low lung volumes.  Right base atelectasis. Electronically Signed   By: Charlett Nose M.D.   On: 02/14/2023 19:17   DG Chest Port 1 View Result Date: 02/14/2023 CLINICAL DATA:  Shortness of breath. EXAM: PORTABLE CHEST 1 VIEW COMPARISON:  02/16/2020. FINDINGS: Low  lung volume. Mild pulmonary vascular congestion, which may be accentuated by low lung volume. No frank pulmonary edema. Bilateral lung fields are otherwise clear. No acute consolidation or lung collapse. Bilateral costophrenic angles are clear. Normal cardio-mediastinal silhouette. No acute osseous abnormalities. The soft tissues are within normal limits. IMPRESSION: *Mild diffuse pulmonary vascular congestion. Otherwise, no acute cardiopulmonary abnormality. Electronically Signed   By: Jules Schick M.D.   On: 02/14/2023 13:26   CT ABDOMEN PELVIS W CONTRAST Result Date: 02/14/2023 CLINICAL DATA:  Acute generalized abdominal pain. EXAM: CT ABDOMEN AND PELVIS WITH CONTRAST TECHNIQUE: Multidetector CT imaging of the abdomen and pelvis was performed using the standard protocol following bolus administration of intravenous contrast. RADIATION DOSE REDUCTION: This exam was performed according to the departmental dose-optimization program which includes automated exposure control, adjustment of the mA and/or kV according to patient size and/or use of iterative reconstruction technique. CONTRAST:  OMNIPAQUE IOHEXOL 300 MG/ML  SOLN COMPARISON:  February 11, 2020. FINDINGS: Lower chest: No acute abnormality. Hepatobiliary: No focal liver abnormality is seen. No gallstones, gallbladder wall thickening, or biliary dilatation. Pancreas: Unremarkable. No pancreatic ductal dilatation or surrounding inflammatory changes. Spleen: Normal in size without focal abnormality. Adrenals/Urinary Tract: Adrenal glands appear normal. Stable left renal cyst is noted for which no further follow-up is required. No hydronephrosis or renal obstruction is noted. Urinary bladder is unremarkable. Stomach/Bowel: The stomach is unremarkable. The appendix appears normal. Mild amount of free air is noted in the epigastric region and in the pelvis consistent with rupture of hollow viscus. Mildly dilated and thick walled small bowel loops are  noted in the pelvis and lower abdomen suggesting enteritis. There also appears to be mild sigmoid wall thickening suggesting possible diverticulitis. There is noted air between a small bowel loop and sigmoid colon, and therefore the source of perforation is not clearly identifiable. Vascular/Lymphatic: No significant vascular findings are present. No enlarged abdominal or pelvic lymph nodes. Reproductive: Prostate is unremarkable. Other: No ascites or hernia is noted. Musculoskeletal: No acute or significant osseous findings. IMPRESSION: Pneumoperitoneum is noted both in the epigastric and pelvic regions concerning for rupture of hollow viscus. Mildly dilated and inflamed small bowel loops are noted in lower abdomen and pelvis as well as mild sigmoid wall thickening suggesting possible inflammation there is well. It is uncertain if the pneumoperitoneum is secondary to ruptured small or large bowel. Critical Value/emergent results were called by telephone at the time of interpretation on 02/14/2023 at 1:22 pm to provider JOSEPH ZAMMIT , who verbally acknowledged these results. Electronically Signed   By: Lupita Raider M.D.   On: 02/14/2023 13:24   ECHOCARDIOGRAM COMPLETE Result Date: 01/26/2023    ECHOCARDIOGRAM REPORT   Patient Name:   Ryan Gibson Date of Exam: 01/26/2023 Medical Rec #:  [id number]           Height:       74.0 in Accession #:    [id number]          Weight:       247.0 lb Date of Birth:  03-Nov-1975          BSA:          2.377 m Patient Age:    47 years            BP:           122/99 mmHg Patient Gender: M  HR:           72 bpm. [facility] Procedure: 2D Echo, 3D Echo, Cardiac Doppler, Color Doppler and Strain Analysis Indications:    I50.40* Unspecified combined systolic (congestive) and diastolic                 (congestive) heart failure  History:        Patient has prior history of Echocardiogram examinations, most                 recent 02/16/2020.  Cardiomyopathy and CHF,                 Signs/Symptoms:Shortness of Breath, Dyspnea and Fatigue; Risk                 Factors:Hypertension and Dyslipidemia. Pulmonary embolus.  Sonographer:    Sheralyn Boatman RDCS Referring Phys: 73 TESSA N CONTE  Sonographer Comments: Image acquisition challenging due to patient body habitus and Image acquisition challenging due to respiratory motion. IMPRESSIONS  1. Left ventricular ejection fraction, by estimation, is 50 to 55%. The left ventricle has low normal function. The left ventricle has no regional wall motion abnormalities. The left ventricular internal cavity size was mildly dilated. There is mild concentric left ventricular hypertrophy. Left ventricular diastolic parameters are consistent with Grade I diastolic dysfunction (impaired relaxation). The average left ventricular global longitudinal strain is -19.3 %. The global longitudinal strain is normal.  2. Right ventricular systolic function is normal. The right ventricular size is normal. Tricuspid regurgitation signal is inadequate for assessing PA pressure.  3. The mitral valve is grossly normal. Mild mitral valve regurgitation.  4. The aortic valve is tricuspid. Aortic valve regurgitation is not visualized. No aortic stenosis is present. Aortic valve mean gradient measures 3.0 mmHg.  5. Aortic dilatation noted. There is moderate dilatation of the aortic root, measuring 44 mm.  6. The inferior vena cava is normal in size with greater than 50% respiratory variability, suggesting right atrial pressure of 3 mmHg. Comparison(s): Prior images reviewed side by side. LVEF improved significantly in comparison, now 50-55% range. Mitral regurgitation is mild. Moderately dilated aortic root. FINDINGS  Left Ventricle: Left ventricular ejection fraction, by estimation, is 50 to 55%. The left ventricle has low normal function. The left ventricle has no regional wall motion abnormalities. The average left ventricular global  longitudinal strain is -19.3 %. The global longitudinal strain is normal. The left ventricular internal cavity size was mildly dilated. There is mild concentric left ventricular hypertrophy. Left ventricular diastolic parameters are consistent with Grade I diastolic dysfunction (impaired relaxation). Right Ventricle: The right ventricular size is normal. No increase in right ventricular wall thickness. Right ventricular systolic function is normal. Tricuspid regurgitation signal is inadequate for assessing PA pressure. Left Atrium: Left atrial size was normal in size. Right Atrium: Right atrial size was normal in size. Pericardium: There is no evidence of pericardial effusion. Presence of epicardial fat layer. Mitral Valve: The mitral valve is grossly normal. Mild mitral valve regurgitation. Tricuspid Valve: The tricuspid valve is grossly normal. Tricuspid valve regurgitation is trivial. Aortic Valve: The aortic valve is tricuspid. There is mild aortic valve annular calcification. Aortic valve regurgitation is not visualized. No aortic stenosis is present. Aortic valve mean gradient measures 3.0 mmHg. Aortic valve peak gradient measures 5.1 mmHg. Aortic valve area, by VTI measures 3.51 cm. Pulmonic Valve: The pulmonic valve was grossly normal. Pulmonic valve regurgitation is trivial. Aorta: Aortic dilatation noted. There is  moderate dilatation of the aortic root, measuring 44 mm. Venous: The inferior vena cava is normal in size with greater than 50% respiratory variability, suggesting right atrial pressure of 3 mmHg. IAS/Shunts: No atrial level shunt detected by color flow Doppler.  LEFT VENTRICLE PLAX 2D LVIDd:         6.20 cm      Diastology LVIDs:         5.20 cm      LV e' medial:    4.46 cm/s LV PW:         1.10 cm      LV E/e' medial:  11.1 LV IVS:        0.90 cm      LV e' lateral:   8.70 cm/s LVOT diam:     2.40 cm      LV E/e' lateral: 5.7 LV SV:         72 LV SV Index:   30           2D Longitudinal  Strain LVOT Area:     4.52 cm     2D Strain GLS Avg:     -19.3 %  LV Volumes (MOD) LV vol d, MOD A2C: 138.0 ml 3D Volume EF: LV vol d, MOD A4C: 113.0 ml 3D EF:        51 % LV vol s, MOD A2C: 62.5 ml  LV EDV:       214 ml LV vol s, MOD A4C: 55.6 ml  LV ESV:       105 ml LV SV MOD A2C:     75.5 ml  LV SV:        109 ml LV SV MOD A4C:     113.0 ml LV SV MOD BP:      66.7 ml RIGHT VENTRICLE RV S prime:     10.90 cm/s TAPSE (M-mode): 2.1 cm LEFT ATRIUM             Index        RIGHT ATRIUM           Index LA diam:        2.30 cm 0.97 cm/m   RA Area:     11.90 cm LA Vol (A2C):   71.1 ml 29.91 ml/m  RA Volume:   26.30 ml  11.06 ml/m LA Vol (A4C):   37.9 ml 15.94 ml/m LA Biplane Vol: 54.7 ml 23.01 ml/m  AORTIC VALVE AV Area (Vmax):    3.35 cm AV Area (Vmean):   3.31 cm AV Area (VTI):     3.51 cm AV Vmax:           113.00 cm/s AV Vmean:          73.300 cm/s AV VTI:            0.206 m AV Peak Grad:      5.1 mmHg AV Mean Grad:      3.0 mmHg LVOT Vmax:         83.70 cm/s LVOT Vmean:        53.600 cm/s LVOT VTI:          0.160 m LVOT/AV VTI ratio: 0.78  AORTA Ao Root diam: 4.40 cm Ao Asc diam:  3.65 cm MITRAL VALVE MV Area (PHT): 3.21 cm    SHUNTS MV Decel Time: 236 msec    Systemic VTI:  0.16 m MV E velocity: 49.70 cm/s  Systemic Diam: 2.40 cm MV A velocity:  72.80 cm/s MV E/A ratio:  0.68 Nona Dell MD Electronically signed by Nona Dell MD Signature Date/Time: 01/26/2023/4:11:39 PM    Final     Catarina Hartshorn, DO  [facility]  If 7PM-7AM, please contact night-coverage [email or web address] Password [email or web address] 02/19/2023, 5:48 PM   LOS: 5 days

## 2023-02-20 ENCOUNTER — Inpatient Hospital Stay (HOSPITAL_COMMUNITY): Payer: BC Managed Care – PPO

## 2023-02-20 DIAGNOSIS — K567 Ileus, unspecified: Secondary | ICD-10-CM | POA: Diagnosis not present

## 2023-02-20 DIAGNOSIS — R198 Other specified symptoms and signs involving the digestive system and abdomen: Secondary | ICD-10-CM | POA: Diagnosis not present

## 2023-02-20 DIAGNOSIS — M0600 Rheumatoid arthritis without rheumatoid factor, unspecified site: Secondary | ICD-10-CM | POA: Diagnosis not present

## 2023-02-20 DIAGNOSIS — I5022 Chronic systolic (congestive) heart failure: Secondary | ICD-10-CM | POA: Diagnosis not present

## 2023-02-20 DIAGNOSIS — D6869 Other thrombophilia: Secondary | ICD-10-CM | POA: Diagnosis not present

## 2023-02-20 LAB — BASIC METABOLIC PANEL
Anion gap: 13 (ref 5–15)
BUN: 5 mg/dL — ABNORMAL LOW (ref 6–20)
CO2: 21 mmol/L — ABNORMAL LOW (ref 22–32)
Calcium: 8.3 mg/dL — ABNORMAL LOW (ref 8.9–10.3)
Chloride: 101 mmol/L (ref 98–111)
Creatinine, Ser: 0.75 mg/dL (ref 0.61–1.24)
GFR, Estimated: >60 mL/min (ref >60)
Glucose, Bld: 83 mg/dL (ref 70–99)
Potassium: 3.2 mmol/L — ABNORMAL LOW (ref 3.5–5.1)
Sodium: 135 mmol/L (ref 135–145)

## 2023-02-20 LAB — MAGNESIUM: Magnesium: 1.7 mg/dL (ref 1.7–2.4)

## 2023-02-20 LAB — CBC
HCT: 33.3 % — ABNORMAL LOW (ref 39.0–52.0)
Hemoglobin: 11.5 g/dL — ABNORMAL LOW (ref 13.0–17.0)
MCH: 33.2 pg (ref 26.0–34.0)
MCHC: 34.5 g/dL (ref 30.0–36.0)
MCV: 96.2 fL (ref 80.0–100.0)
Platelets: 248 10*3/uL (ref 150–400)
RBC: 3.46 MIL/uL — ABNORMAL LOW (ref 4.22–5.81)
RDW: 13 % (ref 11.5–15.5)
WBC: 7 10*3/uL (ref 4.0–10.5)
nRBC: 0 % (ref 0.0–0.2)

## 2023-02-20 LAB — HEPARIN LEVEL (UNFRACTIONATED): Heparin Unfractionated: 0.67 [IU]/mL (ref 0.30–0.70)

## 2023-02-20 LAB — PHOSPHORUS: Phosphorus: 3.1 mg/dL (ref 2.5–4.6)

## 2023-02-20 MED ORDER — MAGNESIUM SULFATE 2 GM/50ML IV SOLN
2.0000 g | Freq: Once | INTRAVENOUS | Status: AC
Start: 2023-02-20 — End: 2023-02-20
  Administered 2023-02-20: 2 g via INTRAVENOUS
  Filled 2023-02-20: qty 50

## 2023-02-20 MED ORDER — POTASSIUM CHLORIDE 10 MEQ/100ML IV SOLN
10.0000 meq | INTRAVENOUS | Status: AC
  Administered 2023-02-20 – 2023-02-21 (×5): 10 meq via INTRAVENOUS
  Filled 2023-02-20 (×5): qty 100

## 2023-02-20 MED ORDER — HYDROMORPHONE HCL 1 MG/ML IJ SOLN
0.5000 mg | INTRAMUSCULAR | Status: DC | PRN
  Administered 2023-02-20 – 2023-02-21 (×9): 1 mg via INTRAVENOUS
  Administered 2023-02-21: 0.5 mg via INTRAVENOUS
  Administered 2023-02-21 – 2023-02-22 (×8): 1 mg via INTRAVENOUS
  Filled 2023-02-20 (×18): qty 1

## 2023-02-20 MED ORDER — OXYCODONE HCL 5 MG PO TABS
5.0000 mg | ORAL_TABLET | ORAL | Status: DC | PRN
  Administered 2023-02-20: 5 mg via ORAL
  Administered 2023-02-21 (×2): 10 mg via ORAL
  Administered 2023-02-21 (×2): 5 mg via ORAL
  Administered 2023-02-22 – 2023-02-26 (×4): 10 mg via ORAL
  Filled 2023-02-20: qty 1
  Filled 2023-02-20 (×3): qty 2
  Filled 2023-02-20: qty 1
  Filled 2023-02-20: qty 2
  Filled 2023-02-20: qty 1
  Filled 2023-02-20 (×2): qty 2

## 2023-02-20 NOTE — Consult Note (Signed)
.  Pharmacy Consult Note - Anticoagulation  Pharmacy Consult for Heparin Indication:  history of pulmonary embolism  PATIENT MEASUREMENTS: Height: 6\' 2"  (188 cm) Weight: 108.8 kg (239 lb 13.8 oz) IBW/kg (Calculated) : 82.2 HEPARIN DW (KG): 95.8  VITAL SIGNS: Temp: 98.1 F (36.7 C) (12/30 0505) Temp Source: Oral (12/30 0505) BP: 129/91 (12/30 0505) Pulse Rate: 84 (12/30 0505)  Recent Labs    02/20/23 0522  HGB 11.5*  HCT 33.3*  PLT 248  HEPARINUNFRC 0.67  CREATININE 0.75    Estimated Creatinine Clearance: 149.8 mL/min (by C-G formula based on SCr of 0.75 mg/dL).  PAST MEDICAL HISTORY: Past Medical History:  Diagnosis Date   Asthma    AS A CHILD ONLY   DVT (deep venous thrombosis) (HCC)    Foot pain, right    painful x 1 month and swelling   Idiopathic gout     ASSESSMENT: 47 y.o. male with PMH including pulmonary embolism in 2023 is presenting with a bowel perforation, and is now s/p exploratory laparotomy. Patient is on chronic anticoagulation with Eliquis. Last dose was 02/13/2023 in the evening. Patient received Kcentra on 12/24 due to need for emergent procedure. Pharmacy has been consulted to initiate and manage heparin intravenous infusion.  HL 0.67- therapeutic   Goal(s) of therapy: Heparin level 0.3-0.7 units/mL Monitor platelets by anticoagulation protocol: Yes   PLAN: Continue heparin infusion at 2700 units/hr Heparin level next with tomorrow AM labs Caution regarding boluses due to recent surgery   Judeth Cornfield, PharmD Clinical Pharmacist 02/20/2023 7:52 AM

## 2023-02-20 NOTE — Progress Notes (Signed)
Patient had 2 small Bms overnight and he ambulated to and around the nurses station twice.

## 2023-02-20 NOTE — Progress Notes (Signed)
Mobility Specialist Progress Note:    02/20/23 1208  Mobility  Activity Refused mobility   Pt politely refused mobility, has been ambulating halls multiple times a day independently with wife. All needs met.   Ryan Gibson Mobility Specialist Please contact via Special educational needs teacher or  Rehab office at [phone]

## 2023-02-20 NOTE — Plan of Care (Signed)

## 2023-02-20 NOTE — Discharge Instructions (Signed)
Discharge Open Abdominal Surgery Instructions:  Drain Instructions: Please keep the drain clean and dry. Replace the gauze/ tape around the drain if it gets dirty or wet/ saturated. Please do not mess with or cut the stitch that is keeping the drain in place. Secure the drain to your clothes so that it does not get dislodged.  You may want to wear a binder around your abdomen (girdle) at night for sleeping if you are worried about it getting pulled out.  Please record the output from the drain daily including the color and the amount in milliliters.  Please keep the drain covered with plastic and tape when you shower so that it does not get wet.   Common Complaints: Pain at the incision site is common. This will improve with time. Take your pain medications as described below. Some nausea is common and poor appetite. The main goal is to stay hydrated the first few days after surgery.   Diet/ Activity: Diet as tolerated. You have started and tolerated a diet in the hospital, and should continue to increase what you are able to eat.   You may not have a large appetite, but it is important to stay hydrated. Drink 64 ounces of water a day. Your appetite will return with time.  Eat a easy to digest, low fiber diet for the first 1-2 weeks after discharge. This will help you digest things more easily.  Keep a dry dressing in place over your staples daily or as needed. Some minor pink/ blood tinged drainage is expected. This will stop in a few days after surgery.  Shower per your regular routine daily.  Do not take hot showers. Take warm showers that are less than 10 minutes. Pat the incision dry. Keep any Drains covered when showering.  Wear an abdominal binder daily with activity. You do not have to wear this while sleeping or sitting.  Rest and listen to your body, but do not remain in bed all day.  Walk everyday for at least 15-20 minutes. Deep cough and move around every 1-2 hours in the first  few days after surgery.  Do not lift > 10 lbs, perform excessive bending, pushing, pulling, squatting for 6-8 weeks after surgery.  The activity restrictions and the abdominal binder are to prevent hernia formation at your incision while you are healing.  Do not place lotions or balms on your incision unless instructed to specifically by Dr. Henreitta Leber.   Pain Expectations and Narcotics: -After surgery you will have pain associated with your incisions and this is normal. The pain is muscular and nerve pain, and will get better with time. -You are encouraged and expected to take non narcotic medications like tylenol and ibuprofen (when able) to treat pain as multiple modalities can aid with pain treatment. -Narcotics are only used when pain is severe or there is breakthrough pain. -You are not expected to have a pain score of 0 after surgery, as we cannot prevent pain. A pain score of 3-4 that allows you to be functional, move, walk, and tolerate some activity is the goal. The pain will continue to improve over the days after surgery and is dependent on your surgery. -Due to [place] law, we are only able to give a certain amount of pain medication to treat post operative pain, and we only give additional narcotics on a patient by patient basis.  -For most laparoscopic surgery, studies have shown that the majority of patients only need 10-15 narcotic pills, and  for open surgeries most patients only need 15-20.   -Having appropriate expectations of pain and knowledge of pain management with non narcotics is important as we do not want anyone to become addicted to narcotic pain medication.  -Using ice packs in the first 48 hours and heating pads after 48 hours, wearing an abdominal binder (when recommended), and using over the counter medications are all ways to help with pain management.   -Simple acts like meditation and mindfulness practices after surgery can also help with pain control and research has proven  the benefit of these practices.  Medication: Take tylenol and ibuprofen as needed for pain control, alternating every 4-6 hours.  Example:  Tylenol 1000mg  @ 6am, 12noon, 6pm, (Do not exceed 4000mg  of tylenol a day). Ibuprofen 800mg  @ 9am, 3pm, 9pm, 3am (Do not exceed 3600mg  of ibuprofen a day).  Take Roxicodone for breakthrough pain every 4 hours.  Take Colace for constipation related to narcotic pain medication. If you do not have a bowel movement in 2 days, take Miralax over the counter.  Drink plenty of water to also prevent constipation.   Contact Information: If you have questions or concerns, please call our office, [phone], Monday- Thursday 8AM-5PM and Friday 8AM-12Noon.  If it is after hours or on the weekend, please call [facility] Number, [phone], [phone] and ask to speak to the surgeon on call for Dr. Henreitta Leber at [facility]

## 2023-02-20 NOTE — Plan of Care (Signed)

## 2023-02-20 NOTE — Progress Notes (Addendum)
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  He presented from home by EMS complaining of severe generalized abdominal pain with nausea vomiting and diarrhea that started early this morning.  The pain is 10/10.  He feels very ill and having palpitations.  He denies fever and chills.  In the ED he was noted to have a rigid abdomen and sent for CT abdomen pelvis with findings of concern for a perforated abdominal viscus.  Emergent surgical consultation requested.  He is started on IV antibiotics and IV fluids and admission requested for further management. The patient underwent exploratory laparotomy for his pneumoperitoneum on 02/14/2023.  A JP drain was placed over the stomach and into the pelvis.  General surgery continue to follow-up for postoperative care.  His hospitalization has been prolonged secondary to patient removal of his NG tube and postoperative ileus. NG tube was replaced on 02/17/2023 putting out 1500 cc.   Assessment/Plan:  Sepsis from intraabdominal infection/pneumoperitoneum - pt emergently taken to OR on 12/24 by Dr. Henreitta Leber for exploratory lap -sepsis physiology resolving and markedly improved  -Continue Zosyn -Continue IV fluids -Lactic acid 2.9>> 1.4 - lactic acidosis resolved  -sepsis physiology resolved   Perforated abdominal viscous with pneumoperitoneum/postoperative ileus - Dr. Henreitta Leber took him  to OR emergently after Kcentra treatment on 12/24  - continue supportive measures - NG came out x2 -NG tube replaced 02/17/2023 with 1500 cc output -Changes  medications to IV when possible -may clamp NG short time to allow for ambulation hallway -12/29--tiny BM, small flatus;  KUB with gas in colon -12/30--tiny BM, passing flatus; KUB with gas in colon   History of PE/ Acquired thrombophilia -Patient diagnosed with PE and right lower extremity DVT 01/2020 -Felt to be secondary to underlying COVID-19 infection - last dose of apixaban was taken 12/23 at 6pm - K-centra was given 12/24   - IV heparin started no bolus 12/25 per surgery  -Monitor hemoglobin>>remains stable   Chronic HFrEF -02/05/2023 echo EF 55%, no WMA, normal RVF, grade 1 DD - 02/16/2020 echo EF < 20 % -Initially felt to be secondary to alcohol abuse -Entresto on hold until patient able to tolerate p.o. -Holding Jardiance secondary to acute infectious process -Metoprolol succinate transition to IV Lopressor until able to tolerate p.o. -Restart spironolactone when able to tolerate p.o. -Appears clinically euvolemic   Leukemoid reaction - secondary to abdominal infection and surgery - following blood cultures x 2 : no growth to date  - unfortunately IV ceftriaxone had already been given prior to blood cultures - WBC improved   Hypomagnesemia - IV replacement ordered and repleted    Depression/GAD / alcohol dependence - IV lorazepam ordered per CIWA protocol  - supplemental vitamins per CIWA protocol  -No signs of alcohol withdrawal - will try to give cymbalta thru NG tube if possible -Continue folic acid and thiamine   Essential hypertension -Patient was on Entresto and metoprolol succinate prior to admission -Continue IV Lopressor for  now   Normocytic anemia  - continue daily morning CBC  -Hgb stable   Hyperbilirubinemia - likely secondary to congestive changes in the setting of alcohol dependence    Generalized abdominal pain  - severe pain being treated with IV medications - IV hydromorphone ordered as needed    Rheumatoid arthritis  - he is relatively immunocompromised - his rheumatologist has him on prednisone   Tobacco user - nicotine patch ordered    Sinus tachycardia  - likely having beta blocker rebound tachycardia  - IV lopressor while NPO with hold parameters - resume home oral metoprolol XL 150 mg when able to take p.o. better   Hypokalemia -Repleted -Check magnesium 1.9         Family Communication:   spouse at bedside 12/30   Consultants:  general surgery   Code Status:  FULL    DVT Prophylaxis:  IV Heparin     Procedures: As Listed in Progress Note Above   Antibiotics: Zosyn 12/25>>12/29    Subjective: Patient denies fevers, chills, headache, chest pain, dyspnea, nausea, vomiting, diarrhea, abdominal pain, dysuria, hematuria, hematochezia, and melena.  Objective: Vitals:   02/19/23 2038 02/20/23 0505 02/20/23 1447 02/20/23 1729  BP: 128/80 (!) 129/91 (!) 141/92 (!) 143/92  Pulse: 77 84 95 81  Resp: 18 19 19 18   Temp: 98.8 F (37.1 C) 98.1 F (36.7 C) 98.5 F (36.9 C) 98.4 F (36.9 C)  TempSrc: Oral Oral Oral Oral  SpO2: 95% 97% 97% 98%  Weight:  108.8 kg    Height:        Intake/Output Summary (Last 24 hours) at 02/20/2023 1815 Last data filed at 02/20/2023 1700 Gross per 24 hour  Intake 446 ml  Output 4110 ml  Net -3664 ml   Weight change: -1.469 kg Exam:  General:  Pt is alert, follows commands appropriately, not in acute distress HEENT: No icterus, No thrush, No neck mass, [place]/AT Cardiovascular: RRR, S1/S2, no rubs, no gallops Respiratory: CTA bilaterally, no wheezing, no crackles, no rhonchi Abdomen: Soft/+BS, mild diffuse  tender, non distended, no guarding Extremities: No edema, No lymphangitis, No petechiae, No rashes, no synovitis   Data Reviewed: I have personally reviewed following labs and imaging  studies Basic Metabolic Panel: Recent Labs  Lab 02/15/23 0513 02/16/23 0039 02/17/23 0031 02/18/23 0406 02/19/23 0348 02/20/23 0522  NA 139 135 136 135 136 135  K 3.8 3.6 3.2* 3.3* 3.4* 3.2*  CL 107 101 102 101 102 101  CO2 24 26 24 23 23  21*  GLUCOSE 121* 103* 86 84 78 83  BUN 19 14 10 9 7  5*  CREATININE 0.82 1.02 0.77 0.73 0.70 0.75  CALCIUM 7.8* 7.9* 8.2* 8.3* 8.0* 8.3*  MG 1.6* 2.4  --  1.9 1.9 1.7  PHOS 4.3  --   --  2.8 2.6 3.1   Liver Function Tests: Recent Labs  Lab 02/15/23 0513 02/16/23 0039 02/17/23 0031 02/18/23 0406 02/19/23 0348  AST 23 26 21 26 30   ALT 25 29 26 26 29   ALKPHOS 31* 42 40 45 37*  BILITOT 1.2* 1.4* 1.7* 1.6* 1.5*  PROT 5.1* 5.3* 5.3* 5.8* 5.5*  ALBUMIN 2.8* 2.6* 2.6* 2.9* 2.4*   Recent Labs  Lab 02/14/23 1206  LIPASE 42   No results for input(s): "AMMONIA" in the last 168 hours. Coagulation Profile: Recent Labs  Lab 02/15/23 1729  INR 1.5*   CBC: Recent Labs  Lab 02/15/23 0513 02/16/23 0039 02/16/23 1221 02/17/23 0031 02/18/23 0406  02/18/23 1042 02/19/23 0348 02/20/23 0522  WBC 14.2* 13.3* 9.4 9.0 7.9  --  6.2 7.0  NEUTROABS 11.9* 10.5*  --  6.9 5.8  --  4.0  --   HGB 15.1 12.7* 11.7* 11.4* 12.8* 12.2* 11.5* 11.5*  HCT 42.5 37.5* 33.7* 33.5* 38.1* 34.6* 32.9* 33.3*  MCV 96.8 97.9 98.8 98.0 97.7  --  96.5 96.2  PLT 179 160 153 157 208  --  205 248   Cardiac Enzymes: No results for input(s): "CKTOTAL", "CKMB", "CKMBINDEX", "TROPONINI" in the last 168 hours. BNP: Invalid input(s): "POCBNP" CBG: No results for input(s): "GLUCAP" in the last 168 hours. HbA1C: No results for input(s): "HGBA1C" in the last 72 hours. Urine analysis:    Component Value Date/Time   COLORURINE YELLOW 02/14/2023 1246   APPEARANCEUR CLEAR 02/14/2023 1246   LABSPEC 1.027 02/14/2023 1246   PHURINE 5.0 02/14/2023 1246   GLUCOSEU >=500 (A) 02/14/2023 1246   HGBUR NEGATIVE 02/14/2023 1246   BILIRUBINUR NEGATIVE 02/14/2023 1246   KETONESUR  NEGATIVE 02/14/2023 1246   PROTEINUR NEGATIVE 02/14/2023 1246   NITRITE NEGATIVE 02/14/2023 1246   LEUKOCYTESUR NEGATIVE 02/14/2023 1246   Sepsis Labs: @LABRCNTIP (procalcitonin:4,lacticidven:4) ) Recent Results (from the past 240 hours)  Resp panel by RT-PCR (RSV, Flu A&B, Covid) Anterior Nasal Swab     Status: None   Collection Time: 02/14/23  1:18 PM   Specimen: Anterior Nasal Swab  Result Value Ref Range Status   SARS Coronavirus 2 by RT PCR NEGATIVE NEGATIVE Final    Comment: (NOTE) SARS-CoV-2 target nucleic acids are NOT DETECTED.  The SARS-CoV-2 RNA is generally detectable in upper respiratory specimens during the acute phase of infection. The lowest concentration of SARS-CoV-2 viral copies this assay can detect is 138 copies/mL. A negative result does not preclude SARS-Cov-2 infection and should not be used as the sole basis for treatment or other patient management decisions. A negative result may occur with  improper specimen collection/handling, submission of specimen other than nasopharyngeal swab, presence of viral mutation(s) within the areas targeted by this assay, and inadequate number of viral copies(<138 copies/mL). A negative result must be combined with clinical observations, patient history, and epidemiological information. The expected result is Negative.  Fact Sheet for Patients:  BloggerCourse.com  Fact Sheet for Healthcare Providers:  SeriousBroker.it  This test is no t yet approved or cleared by the [place] FDA and  has been authorized for detection and/or diagnosis of SARS-CoV-2 by FDA under an Emergency Use Authorization (EUA). This EUA will remain  in effect (meaning this test can be used) for the duration of the COVID-19 declaration under Section 564(b)(1) of the Act, 21 U.S.C.section 360bbb-3(b)(1), unless the authorization is terminated  or revoked sooner.       Influenza A by PCR  NEGATIVE NEGATIVE Final   Influenza B by PCR NEGATIVE NEGATIVE Final    Comment: (NOTE) The Xpert Xpress SARS-CoV-2/FLU/RSV plus assay is intended as an aid in the diagnosis of influenza from Nasopharyngeal swab specimens and should not be used as a sole basis for treatment. Nasal washings and aspirates are unacceptable for Xpert Xpress SARS-CoV-2/FLU/RSV testing.  Fact Sheet for Patients: BloggerCourse.com  Fact Sheet for Healthcare Providers: SeriousBroker.it  This test is not yet approved or cleared by the [place] FDA and has been authorized for detection and/or diagnosis of SARS-CoV-2 by FDA under an Emergency Use Authorization (EUA). This EUA will remain in effect (meaning this test can be used) for the duration of the COVID-19  declaration under Section 564(b)(1) of the Act, 21 U.S.C. section 360bbb-3(b)(1), unless the authorization is terminated or revoked.     Resp Syncytial Virus by PCR NEGATIVE NEGATIVE Final    Comment: (NOTE) Fact Sheet for Patients: BloggerCourse.com  Fact Sheet for Healthcare Providers: SeriousBroker.it  This test is not yet approved or cleared by the [place] FDA and has been authorized for detection and/or diagnosis of SARS-CoV-2 by FDA under an Emergency Use Authorization (EUA). This EUA will remain in effect (meaning this test can be used) for the duration of the COVID-19 declaration under Section 564(b)(1) of the Act, 21 U.S.C. section 360bbb-3(b)(1), unless the authorization is terminated or revoked.  Performed at [facility], [address], [place], [place] [address]   Culture, blood (Routine X 2) w Reflex to ID Panel     Status: None   Collection Time: 02/14/23  2:20 PM   Specimen: Right Antecubital; Blood  Result Value Ref Range Status   Specimen Description RIGHT ANTECUBITAL  Final   Special Requests   Final    BOTTLES  DRAWN AEROBIC AND ANAEROBIC Blood Culture adequate volume   Culture   Final    NO GROWTH 5 DAYS Performed at [facility], [address], [place], [place] [address]    Report Status 02/19/2023 FINAL  Final  Culture, blood (Routine X 2) w Reflex to ID Panel     Status: None   Collection Time: 02/14/23  2:56 PM   Specimen: BLOOD  Result Value Ref Range Status   Specimen Description BLOOD BLOOD RIGHT FOREARM  Final   Special Requests   Final    BOTTLES DRAWN AEROBIC AND ANAEROBIC Blood Culture adequate volume   Culture   Final    NO GROWTH 5 DAYS Performed at [facility], [address] [place], [place] [address]    Report Status 02/19/2023 FINAL  Final  Aerobic/Anaerobic Culture w Gram Stain (surgical/deep wound)     Status: None   Collection Time: 02/14/23  4:17 PM   Specimen: Path fluid; Body Fluid  Result Value Ref Range Status   Specimen Description WOUND  Final   Special Requests PURLANT ASCITES  Final   Gram Stain   Final    ABUNDANT WBC PRESENT, PREDOMINANTLY PMN RARE GRAM NEGATIVE RODS    Culture   Final    FEW ESCHERICHIA COLI FEW BACTEROIDES SPECIES BETA LACTAMASE POSITIVE Performed at [facility], [address], [place], [place] [address]    Report Status 02/17/2023 FINAL  Final   Organism ID, Bacteria ESCHERICHIA COLI  Final      Susceptibility   Escherichia coli - MIC*    AMPICILLIN 4 SENSITIVE Sensitive     CEFEPIME <=0.12 SENSITIVE Sensitive     CEFTAZIDIME <=1 SENSITIVE Sensitive     CEFTRIAXONE <=0.25 SENSITIVE Sensitive     CIPROFLOXACIN <=0.25 SENSITIVE Sensitive     GENTAMICIN <=1 SENSITIVE Sensitive     IMIPENEM <=0.25 SENSITIVE Sensitive     TRIMETH/SULFA <=20 SENSITIVE Sensitive     AMPICILLIN/SULBACTAM <=2 SENSITIVE Sensitive     PIP/TAZO <=4 SENSITIVE Sensitive ug/mL    * FEW ESCHERICHIA COLI  MRSA Next Gen by PCR, Nasal     Status: None   Collection Time: 02/14/23  6:30 PM   Specimen: Nasal Mucosa; Nasal Swab  Result Value Ref Range  Status   MRSA by PCR Next Gen NOT DETECTED NOT DETECTED Final    Comment: (NOTE) The GeneXpert MRSA Assay (FDA approved for  NASAL specimens only), is one component of a comprehensive MRSA colonization surveillance program. It is not intended to diagnose MRSA infection nor to guide or monitor treatment for MRSA infections. Test performance is not FDA approved in patients less than 41 years old. Performed at [facility], [address] [place], [place] [address]      Scheduled Meds:  Chlorhexidine Gluconate Cloth  6 each Topical Daily   colchicine  0.6 mg Oral Daily   DULoxetine  60 mg Oral Daily   folic acid  1 mg Intravenous Daily   metoprolol tartrate  2.5 mg Intravenous Q6H   multivitamin with minerals  1 tablet Oral Daily   nicotine  14 mg Transdermal q1600   pantoprazole (PROTONIX) IV  40 mg Intravenous Q12H   spironolactone  25 mg Oral Daily   thiamine  100 mg Oral Daily   Or   thiamine  100 mg Intravenous Daily   Continuous Infusions:  heparin 2,700 Units/hr (02/20/23 0850)   magnesium sulfate bolus IVPB 2 g (02/20/23 1738)    Procedures/Studies: DG Abd 1 View Result Date: 02/20/2023 CLINICAL DATA:  Postoperative ileus and abdominal pain. EXAM: ABDOMEN - 1 VIEW COMPARISON:  February 19, 2023. FINDINGS: Surgical drains are noted in the epigastric and pelvic regions. Mildly dilated small bowel loops are noted suggesting postoperative ileus. No colonic dilatation is noted. IMPRESSION: Stable small bowel dilatation concerning for postoperative ileus. Electronically Signed   By: Lupita Raider M.D.   On: 02/20/2023 13:13   DG Abd 1 View Result Date: 02/19/2023 CLINICAL DATA:  Abdominal pain and distention. Postop from exploratory laparotomy. EXAM: ABDOMEN - 1 VIEW COMPARISON:  None Available. FINDINGS: Surgical drain seen in the upper abdomen and pelvis, as well as midline skin staples. Mild diffuse dilatation of small bowel loops is seen as well as mild gaseous distention of  the transverse colon. This likely represents a postop ileus, with partial small bowel obstruction considered less likely. IMPRESSION: Probable postop ileus, with partial small bowel obstruction considered less likely. Electronically Signed   By: Danae Orleans M.D.   On: 02/19/2023 13:04   DG Chest Port 1 View Result Date: 02/17/2023 CLINICAL DATA:  Nasogastric tube positioning. Recent exploratory laparotomy. EXAM: PORTABLE CHEST 1 VIEW COMPARISON:  02/14/2026 FINDINGS: Stable heart size. Nasogastric tube continues to extend into the stomach which shows some persistent gaseous distension. Correlation suggested with effectiveness of wall suction and patency of the tube itself. Additional surgical Penrose drain again visible in the upper abdomen. Stable elevation of the right hemidiaphragm. Slight increase in bibasilar atelectasis. No overt edema. No pneumothorax or significant pleural fluid. IMPRESSION: 1. Nasogastric tube continues to extend into the stomach which shows some persistent gaseous distension. Correlation suggested with effectiveness of wall suction and patency of the tube itself. 2. Slight increase in bibasilar atelectasis. Electronically Signed   By: Irish Lack M.D.   On: 02/17/2023 16:43   DG CHEST PORT 1 VIEW Result Date: 02/15/2023 CLINICAL DATA:  Enteric tube placement EXAM: PORTABLE CHEST 1 VIEW COMPARISON:  Chest radiograph dated 02/14/2023 FINDINGS: Gastric/enteric tube tip projects over the stomach. Drainage catheter is seen projecting over the left upper quadrant. Low lung volumes with bronchovascular crowding. Bibasilar patchy opacities. Enlarged cardiomediastinal silhouette is likely projectional. No definite pneumothorax or pleural effusion. IMPRESSION: 1. Gastric/enteric tube tip projects over the stomach. 2. Low lung volumes with bronchovascular crowding. Bibasilar patchy opacities, likely atelectasis. Electronically Signed   By: Agustin Cree M.D.   On: 02/15/2023  20:06   DG  Chest Port 1 View Result Date: 02/14/2023 CLINICAL DATA:  NG tube placement EXAM: PORTABLE CHEST 1 VIEW COMPARISON:  02/14/2023 FINDINGS: NG tube tip is in the fundus of the stomach. Low lung volumes. Right base atelectasis. Left lung clear. Heart is normal size. No effusions. IMPRESSION: NG tube tip in the stomach. Low lung volumes.  Right base atelectasis. Electronically Signed   By: Charlett Nose M.D.   On: 02/14/2023 19:17   DG Chest Port 1 View Result Date: 02/14/2023 CLINICAL DATA:  Shortness of breath. EXAM: PORTABLE CHEST 1 VIEW COMPARISON:  02/16/2020. FINDINGS: Low lung volume. Mild pulmonary vascular congestion, which may be accentuated by low lung volume. No frank pulmonary edema. Bilateral lung fields are otherwise clear. No acute consolidation or lung collapse. Bilateral costophrenic angles are clear. Normal cardio-mediastinal silhouette. No acute osseous abnormalities. The soft tissues are within normal limits. IMPRESSION: *Mild diffuse pulmonary vascular congestion. Otherwise, no acute cardiopulmonary abnormality. Electronically Signed   By: Jules Schick M.D.   On: 02/14/2023 13:26   CT ABDOMEN PELVIS W CONTRAST Result Date: 02/14/2023 CLINICAL DATA:  Acute generalized abdominal pain. EXAM: CT ABDOMEN AND PELVIS WITH CONTRAST TECHNIQUE: Multidetector CT imaging of the abdomen and pelvis was performed using the standard protocol following bolus administration of intravenous contrast. RADIATION DOSE REDUCTION: This exam was performed according to the departmental dose-optimization program which includes automated exposure control, adjustment of the mA and/or kV according to patient size and/or use of iterative reconstruction technique. CONTRAST:  OMNIPAQUE IOHEXOL 300 MG/ML  SOLN COMPARISON:  February 11, 2020. FINDINGS: Lower chest: No acute abnormality. Hepatobiliary: No focal liver abnormality is seen. No gallstones, gallbladder wall thickening, or biliary dilatation. Pancreas:  Unremarkable. No pancreatic ductal dilatation or surrounding inflammatory changes. Spleen: Normal in size without focal abnormality. Adrenals/Urinary Tract: Adrenal glands appear normal. Stable left renal cyst is noted for which no further follow-up is required. No hydronephrosis or renal obstruction is noted. Urinary bladder is unremarkable. Stomach/Bowel: The stomach is unremarkable. The appendix appears normal. Mild amount of free air is noted in the epigastric region and in the pelvis consistent with rupture of hollow viscus. Mildly dilated and thick walled small bowel loops are noted in the pelvis and lower abdomen suggesting enteritis. There also appears to be mild sigmoid wall thickening suggesting possible diverticulitis. There is noted air between a small bowel loop and sigmoid colon, and therefore the source of perforation is not clearly identifiable. Vascular/Lymphatic: No significant vascular findings are present. No enlarged abdominal or pelvic lymph nodes. Reproductive: Prostate is unremarkable. Other: No ascites or hernia is noted. Musculoskeletal: No acute or significant osseous findings. IMPRESSION: Pneumoperitoneum is noted both in the epigastric and pelvic regions concerning for rupture of hollow viscus. Mildly dilated and inflamed small bowel loops are noted in lower abdomen and pelvis as well as mild sigmoid wall thickening suggesting possible inflammation there is well. It is uncertain if the pneumoperitoneum is secondary to ruptured small or large bowel. Critical Value/emergent results were called by telephone at the time of interpretation on 02/14/2023 at 1:22 pm to provider JOSEPH ZAMMIT , who verbally acknowledged these results. Electronically Signed   By: Lupita Raider M.D.   On: 02/14/2023 13:24   ECHOCARDIOGRAM COMPLETE Result Date: 01/26/2023    ECHOCARDIOGRAM REPORT   Patient Name:   LUXTON SALONEN Date of Exam: 01/26/2023 Medical Rec #:  [id number]           Height:  74.0  in Accession #:    [id number]          Weight:       247.0 lb Date of Birth:  06/22/1975          BSA:          2.377 m Patient Age:    47 years            BP:           122/99 mmHg Patient Gender: M                   HR:           72 bpm. [facility] Procedure: 2D Echo, 3D Echo, Cardiac Doppler, Color Doppler and Strain Analysis Indications:    I50.40* Unspecified combined systolic (congestive) and diastolic                 (congestive) heart failure  History:        Patient has prior history of Echocardiogram examinations, most                 recent 02/16/2020. Cardiomyopathy and CHF,                 Signs/Symptoms:Shortness of Breath, Dyspnea and Fatigue; Risk                 Factors:Hypertension and Dyslipidemia. Pulmonary embolus.  Sonographer:    Sheralyn Boatman RDCS Referring Phys: 10 TESSA N CONTE  Sonographer Comments: Image acquisition challenging due to patient body habitus and Image acquisition challenging due to respiratory motion. IMPRESSIONS  1. Left ventricular ejection fraction, by estimation, is 50 to 55%. The left ventricle has low normal function. The left ventricle has no regional wall motion abnormalities. The left ventricular internal cavity size was mildly dilated. There is mild concentric left ventricular hypertrophy. Left ventricular diastolic parameters are consistent with Grade I diastolic dysfunction (impaired relaxation). The average left ventricular global longitudinal strain is -19.3 %. The global longitudinal strain is normal.  2. Right ventricular systolic function is normal. The right ventricular size is normal. Tricuspid regurgitation signal is inadequate for assessing PA pressure.  3. The mitral valve is grossly normal. Mild mitral valve regurgitation.  4. The aortic valve is tricuspid. Aortic valve regurgitation is not visualized. No aortic stenosis is present. Aortic valve mean gradient measures 3.0 mmHg.  5. Aortic dilatation noted. There is moderate dilatation of  the aortic root, measuring 44 mm.  6. The inferior vena cava is normal in size with greater than 50% respiratory variability, suggesting right atrial pressure of 3 mmHg. Comparison(s): Prior images reviewed side by side. LVEF improved significantly in comparison, now 50-55% range. Mitral regurgitation is mild. Moderately dilated aortic root. FINDINGS  Left Ventricle: Left ventricular ejection fraction, by estimation, is 50 to 55%. The left ventricle has low normal function. The left ventricle has no regional wall motion abnormalities. The average left ventricular global longitudinal strain is -19.3 %. The global longitudinal strain is normal. The left ventricular internal cavity size was mildly dilated. There is mild concentric left ventricular hypertrophy. Left ventricular diastolic parameters are consistent with Grade I diastolic dysfunction (impaired relaxation). Right Ventricle: The right ventricular size is normal. No increase in right ventricular wall thickness. Right ventricular systolic function is normal. Tricuspid regurgitation signal is inadequate for assessing PA pressure. Left Atrium: Left atrial size was normal in size. Right Atrium: Right atrial size was normal in size.  Pericardium: There is no evidence of pericardial effusion. Presence of epicardial fat layer. Mitral Valve: The mitral valve is grossly normal. Mild mitral valve regurgitation. Tricuspid Valve: The tricuspid valve is grossly normal. Tricuspid valve regurgitation is trivial. Aortic Valve: The aortic valve is tricuspid. There is mild aortic valve annular calcification. Aortic valve regurgitation is not visualized. No aortic stenosis is present. Aortic valve mean gradient measures 3.0 mmHg. Aortic valve peak gradient measures 5.1 mmHg. Aortic valve area, by VTI measures 3.51 cm. Pulmonic Valve: The pulmonic valve was grossly normal. Pulmonic valve regurgitation is trivial. Aorta: Aortic dilatation noted. There is moderate dilatation of  the aortic root, measuring 44 mm. Venous: The inferior vena cava is normal in size with greater than 50% respiratory variability, suggesting right atrial pressure of 3 mmHg. IAS/Shunts: No atrial level shunt detected by color flow Doppler.  LEFT VENTRICLE PLAX 2D LVIDd:         6.20 cm      Diastology LVIDs:         5.20 cm      LV e' medial:    4.46 cm/s LV PW:         1.10 cm      LV E/e' medial:  11.1 LV IVS:        0.90 cm      LV e' lateral:   8.70 cm/s LVOT diam:     2.40 cm      LV E/e' lateral: 5.7 LV SV:         72 LV SV Index:   30           2D Longitudinal Strain LVOT Area:     4.52 cm     2D Strain GLS Avg:     -19.3 %  LV Volumes (MOD) LV vol d, MOD A2C: 138.0 ml 3D Volume EF: LV vol d, MOD A4C: 113.0 ml 3D EF:        51 % LV vol s, MOD A2C: 62.5 ml  LV EDV:       214 ml LV vol s, MOD A4C: 55.6 ml  LV ESV:       105 ml LV SV MOD A2C:     75.5 ml  LV SV:        109 ml LV SV MOD A4C:     113.0 ml LV SV MOD BP:      66.7 ml RIGHT VENTRICLE RV S prime:     10.90 cm/s TAPSE (M-mode): 2.1 cm LEFT ATRIUM             Index        RIGHT ATRIUM           Index LA diam:        2.30 cm 0.97 cm/m   RA Area:     11.90 cm LA Vol (A2C):   71.1 ml 29.91 ml/m  RA Volume:   26.30 ml  11.06 ml/m LA Vol (A4C):   37.9 ml 15.94 ml/m LA Biplane Vol: 54.7 ml 23.01 ml/m  AORTIC VALVE AV Area (Vmax):    3.35 cm AV Area (Vmean):   3.31 cm AV Area (VTI):     3.51 cm AV Vmax:           113.00 cm/s AV Vmean:          73.300 cm/s AV VTI:            0.206 m AV Peak Grad:  5.1 mmHg AV Mean Grad:      3.0 mmHg LVOT Vmax:         83.70 cm/s LVOT Vmean:        53.600 cm/s LVOT VTI:          0.160 m LVOT/AV VTI ratio: 0.78  AORTA Ao Root diam: 4.40 cm Ao Asc diam:  3.65 cm MITRAL VALVE MV Area (PHT): 3.21 cm    SHUNTS MV Decel Time: 236 msec    Systemic VTI:  0.16 m MV E velocity: 49.70 cm/s  Systemic Diam: 2.40 cm MV A velocity: 72.80 cm/s MV E/A ratio:  0.68 Nona Dell MD Electronically signed by Nona Dell MD  Signature Date/Time: 01/26/2023/4:11:39 PM    Final     Catarina Hartshorn, DO  [facility]  If 7PM-7AM, please contact night-coverage [email or web address] Password [email or web address] 02/20/2023, 6:15 PM   LOS: 6 days

## 2023-02-20 NOTE — Progress Notes (Addendum)
[facility]  Doing ok this AM. Had BM that were like small balls and gas last night. Kub this AM with continued gas in the bowel and findings consistent with ileus.   BP (!) 129/91 (BP Location: Right Arm)   Pulse 84   Temp 98.1 F (36.7 C) (Oral)   Resp 19   Ht 6\' 2"  (1.88 m)   Wt 108.8 kg   SpO2 97%   BMI 30.80 kg/m  Soft, distended, staples c/d/I with no erythema or drainage, ABD replaced, JP with SS output in both, appropriately tender  Patient  Ex lap for pneumoperitoneum, unknown source, drain placement X 2. PRN narcotics, added roxicodone back since have some flatus and small BM IS, OOB NPO, NG and ileus Awaiting more bowel function  Can sip on liquid for comfort but leaving NG to suction now, this will falsely elevate the output some, NG output is looking more clear and less bilious  Can have hard candy and gum.  JPs in place, out when appropriate  H&H stabilized, zosyn ended on 12/29  SCDs, ambulate, heparin gtt   Will need Egd and colonoscopyas outpatient.   Kub with ileus. Have discussed case with Dr. Lovell Sheehan and he agrees. We are almost 1 week out and clinically no signs of continued perforation. The CT with oral contrast would have been more relevant and necessary if the bowel function would have started back in 2-3 days but now that we are a week out, the utility of a CT for any continued perforation is greatly diminished. Will not pursue a CT unless something clinical changes like worsening WBC, worsening pain, JP drain output change.   Updated patient with the plan and my discussion above.   Patient to cancel the Enbrel Appt.  I have put in information for his DC for when that occurs and have a follow up appt ready.   FMLA to be sent to the office- plan for out from admission to 04/10/2022.  Future Appointments  Date Time Provider Department Center  02/28/2023 11:00 AM Murrell Redden, RPH-CPP [facility] None  02/28/2023 11:15 AM Lucretia Roers,  MD [facility] None  03/14/2023  8:30 AM Gearldine Bienenstock, PA-C [facility] None  03/21/2023  8:40 AM Tommie Sams, DO [facility]  04/18/2023 10:30 AM Sharlene Dory, PA-C [facility]   Algis Greenhouse, MD [facility] [address] [place], [place] [address] [phone] (office)

## 2023-02-21 DIAGNOSIS — R198 Other specified symptoms and signs involving the digestive system and abdomen: Secondary | ICD-10-CM | POA: Diagnosis not present

## 2023-02-21 DIAGNOSIS — I5022 Chronic systolic (congestive) heart failure: Secondary | ICD-10-CM | POA: Diagnosis not present

## 2023-02-21 DIAGNOSIS — A419 Sepsis, unspecified organism: Secondary | ICD-10-CM | POA: Diagnosis not present

## 2023-02-21 DIAGNOSIS — R6520 Severe sepsis without septic shock: Secondary | ICD-10-CM | POA: Diagnosis not present

## 2023-02-21 LAB — BASIC METABOLIC PANEL
Anion gap: 12 (ref 5–15)
BUN: <5 mg/dL — ABNORMAL LOW (ref 6–20)
CO2: 21 mmol/L — ABNORMAL LOW (ref 22–32)
Calcium: 8.3 mg/dL — ABNORMAL LOW (ref 8.9–10.3)
Chloride: 102 mmol/L (ref 98–111)
Creatinine, Ser: 0.71 mg/dL (ref 0.61–1.24)
GFR, Estimated: >60 mL/min (ref >60)
Glucose, Bld: 77 mg/dL (ref 70–99)
Potassium: 3.3 mmol/L — ABNORMAL LOW (ref 3.5–5.1)
Sodium: 135 mmol/L (ref 135–145)

## 2023-02-21 LAB — CBC
HCT: 33 % — ABNORMAL LOW (ref 39.0–52.0)
Hemoglobin: 11.1 g/dL — ABNORMAL LOW (ref 13.0–17.0)
MCH: 32.8 pg (ref 26.0–34.0)
MCHC: 33.6 g/dL (ref 30.0–36.0)
MCV: 97.6 fL (ref 80.0–100.0)
Platelets: 274 10*3/uL (ref 150–400)
RBC: 3.38 MIL/uL — ABNORMAL LOW (ref 4.22–5.81)
RDW: 13.4 % (ref 11.5–15.5)
WBC: 7.3 10*3/uL (ref 4.0–10.5)
nRBC: 0 % (ref 0.0–0.2)

## 2023-02-21 LAB — HEPARIN LEVEL (UNFRACTIONATED): Heparin Unfractionated: 0.69 [IU]/mL (ref 0.30–0.70)

## 2023-02-21 LAB — MAGNESIUM: Magnesium: 1.8 mg/dL (ref 1.7–2.4)

## 2023-02-21 LAB — PHOSPHORUS: Phosphorus: 2.9 mg/dL (ref 2.5–4.6)

## 2023-02-21 MED ORDER — MAGNESIUM SULFATE 2 GM/50ML IV SOLN
2.0000 g | Freq: Once | INTRAVENOUS | Status: AC
Start: 2023-02-21 — End: 2023-02-21
  Administered 2023-02-21: 2 g via INTRAVENOUS
  Filled 2023-02-21: qty 50

## 2023-02-21 MED ORDER — BISACODYL 10 MG RE SUPP
10.0000 mg | Freq: Once | RECTAL | Status: AC
  Administered 2023-02-21: 10 mg via RECTAL
  Filled 2023-02-21: qty 1

## 2023-02-21 MED ORDER — POTASSIUM CHLORIDE 10 MEQ/100ML IV SOLN
10.0000 meq | INTRAVENOUS | Status: AC
  Administered 2023-02-21 (×5): 10 meq via INTRAVENOUS
  Filled 2023-02-21 (×5): qty 100

## 2023-02-21 MED ORDER — BOOST / RESOURCE BREEZE PO LIQD CUSTOM
1.0000 | Freq: Three times a day (TID) | ORAL | Status: DC
  Administered 2023-02-21 – 2023-02-22 (×3): 1 via ORAL

## 2023-02-21 MED ORDER — K PHOS MONO-SOD PHOS DI & MONO 155-852-130 MG PO TABS
500.0000 mg | ORAL_TABLET | Freq: Two times a day (BID) | ORAL | Status: DC
  Administered 2023-02-21 – 2023-02-22 (×2): 500 mg via ORAL
  Filled 2023-02-21 (×2): qty 2

## 2023-02-21 MED ORDER — POLYETHYLENE GLYCOL 3350 17 G PO PACK
17.0000 g | PACK | Freq: Once | ORAL | Status: AC
  Administered 2023-02-21: 17 g via ORAL
  Filled 2023-02-21: qty 1

## 2023-02-21 NOTE — Plan of Care (Signed)
  Problem: Education: Goal: Knowledge of General Education information will improve Description: Including pain rating scale, medication(s)/side effects and non-pharmacologic comfort measures Outcome: Progressing   Problem: Activity: Goal: Risk for activity intolerance will decrease Outcome: Progressing   Problem: Elimination: Goal: Will not experience complications related to bowel motility Outcome: Progressing   Problem: Pain Management: Goal: General experience of comfort will improve Outcome: Progressing

## 2023-02-21 NOTE — Progress Notes (Signed)
 7 Days Post-Op  Subjective: Patient had 2 small bowel movements with flatus.  His NG tube is currently clamped.  He denies any significant nausea.  Objective: Vital signs in last 24 hours: Temp:  [97.9 F (36.6 C)-98.5 F (36.9 C)] 97.9 F (36.6 C) (12/31 0346) Pulse Rate:  [78-95] 78 (12/31 0346) Resp:  [18-19] 18 (12/31 0346) BP: (138-150)/(76-92) 150/91 (12/31 0346) SpO2:  [96 %-99 %] 96 % (12/31 0346) Weight:  [108.4 kg] 108.4 kg (12/31 0346) Last BM Date : 02/19/23  Intake/Output from previous day: 12/30 0701 - 12/31 0700 In: 1869.6 [I.V.:953.8; IV Piggyback:915.7] Out: 4473 [Urine:300; Emesis/NG output:4150; Drains:23] Intake/Output this shift: No intake/output data recorded.  General appearance: alert, cooperative, and no distress GI: Soft, incision healing well.  Lower abdominal JP drain removed.  Occasional bowel sounds appreciated.  Lab Results:  Recent Labs    02/20/23 0522 02/21/23 0439  WBC 7.0 7.3  HGB 11.5* 11.1*  HCT 33.3* 33.0*  PLT 248 274   BMET Recent Labs    02/20/23 0522 02/21/23 0439  NA 135 135  K 3.2* 3.3*  CL 101 102  CO2 21* 21*  GLUCOSE 83 77  BUN 5* <5*  CREATININE 0.75 0.71  CALCIUM  8.3* 8.3*   PT/INR No results for input(s): LABPROT, INR in the last 72 hours.  Studies/Results: DG Abd 1 View Result Date: 02/20/2023 CLINICAL DATA:  Postoperative ileus and abdominal pain. EXAM: ABDOMEN - 1 VIEW COMPARISON:  February 19, 2023. FINDINGS: Surgical drains are noted in the epigastric and pelvic regions. Mildly dilated small bowel loops are noted suggesting postoperative ileus. No colonic dilatation is noted. IMPRESSION: Stable small bowel dilatation concerning for postoperative ileus. Electronically Signed   By: Lynwood Landy Raddle M.D.   On: 02/20/2023 13:13    Anti-infectives: Anti-infectives (From admission, onward)    Start     Dose/Rate Route Frequency Ordered Stop   02/15/23 0600  cefoTEtan  (CEFOTAN ) 2 g in sodium chloride   0.9 % 100 mL IVPB        2 g 200 mL/hr over 30 Minutes Intravenous On call to O.R. 02/14/23 1442 02/14/23 1625   02/15/23 0600  piperacillin -tazobactam (ZOSYN ) IVPB 3.375 g        3.375 g 12.5 mL/hr over 240 Minutes Intravenous Every 8 hours 02/14/23 2125 02/19/23 2359   02/15/23 0000  piperacillin -tazobactam (ZOSYN ) IVPB 3.375 g  Status:  Discontinued        3.375 g 12.5 mL/hr over 240 Minutes Intravenous Every 8 hours 02/14/23 1606 02/14/23 2116   02/14/23 2230  piperacillin -tazobactam (ZOSYN ) IVPB 3.375 g  Status:  Discontinued        3.375 g 12.5 mL/hr over 240 Minutes Intravenous Every 8 hours 02/14/23 2116 02/14/23 2125   02/14/23 2215  piperacillin -tazobactam (ZOSYN ) IVPB 3.375 g        3.375 g 12.5 mL/hr over 240 Minutes Intravenous STAT 02/14/23 2124 02/15/23 0135   02/14/23 1504  sodium chloride  0.9 % with cefoTEtan  (CEFOTAN ) ADS Med       Note to Pharmacy: Sherre Constant N: cabinet override      02/14/23 1504 02/14/23 1625   02/14/23 1500  piperacillin -tazobactam (ZOSYN ) IVPB 3.375 g  Status:  Discontinued        3.375 g 100 mL/hr over 30 Minutes Intravenous  Once 02/14/23 1446 02/14/23 2125   02/14/23 1345  cefTRIAXone  (ROCEPHIN ) 2 g in sodium chloride  0.9 % 100 mL IVPB        2 g 200  mL/hr over 30 Minutes Intravenous  Once 02/14/23 1341 02/14/23 1443       Assessment/Plan: s/p Procedure(s): EXPLORATORY LAPAROTOMY, wash out, drain placement Impression: Postoperative ileus, possibly resolving.  Still with hypokalemia. Plan: Will leave NG tube clamped and recheck the situation later on today.  Will start full liquid diet.  Have added MiraLAX  and Dulcolax to help with bowel function.  Continue heparin  drip for now.  LOS: 7 days    Oneil Budge 02/21/2023

## 2023-02-21 NOTE — Consult Note (Signed)
.  Pharmacy Consult Note - Anticoagulation  Pharmacy Consult for Heparin  Indication:  history of pulmonary embolism  PATIENT MEASUREMENTS: Height: 6' 2 (188 cm) Weight: 108.4 kg (238 lb 15.7 oz) IBW/kg (Calculated) : 82.2 HEPARIN  DW (KG): 95.8  VITAL SIGNS: Temp: 97.9 F (36.6 C) (12/31 0346) Temp Source: Oral (12/31 0346) BP: 150/91 (12/31 0346) Pulse Rate: 78 (12/31 0346)  Recent Labs    02/21/23 0439  HGB 11.1*  HCT 33.0*  PLT 274  HEPARINUNFRC 0.69  CREATININE 0.71    Estimated Creatinine Clearance: 149.7 mL/min (by C-G formula based on SCr of 0.71 mg/dL).  PAST MEDICAL HISTORY: Past Medical History:  Diagnosis Date   Asthma    AS A CHILD ONLY   DVT (deep venous thrombosis) (HCC)    Foot pain, right    painful x 1 month and swelling   Idiopathic gout     ASSESSMENT: 47 y.o. male with PMH including pulmonary embolism in 2023 is presenting with a bowel perforation, and is now s/p exploratory laparotomy. Patient is on chronic anticoagulation with Eliquis . Last dose was 02/13/2023 in the evening. Patient received Kcentra  on 12/24 due to need for emergent procedure. Pharmacy has been consulted to initiate and manage heparin  intravenous infusion.  HL 0.69- therapeutic   Goal(s) of therapy: Heparin  level 0.3-0.7 units/mL Monitor platelets by anticoagulation protocol: Yes   PLAN: Continue heparin  infusion at 2700 units/hr Heparin  level next with tomorrow AM labs Caution regarding boluses due to recent surgery   Elspeth Sour, PharmD Clinical Pharmacist 02/21/2023 7:45 AM

## 2023-02-21 NOTE — Progress Notes (Signed)
 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  He presented from home by EMS complaining of severe generalized abdominal pain with nausea vomiting and diarrhea that started early this morning.  The pain is 10/10.  He feels very ill and having palpitations.  He denies fever and chills.  In the ED he was noted to have a rigid abdomen and sent for CT abdomen pelvis with findings of concern for a perforated abdominal viscus.  Emergent surgical consultation requested.  He is started on IV antibiotics and IV fluids and admission requested for further management. The patient underwent exploratory laparotomy for his pneumoperitoneum on 02/14/2023.  A JP drain was placed over the stomach and into the pelvis.  General surgery continue to follow-up for postoperative care.  His hospitalization has been prolonged secondary to patient removal of his NG tube and postoperative ileus. NG tube was replaced on 02/17/2023 putting out 1500 cc.   Assessment/Plan: Sepsis from intraabdominal infection/pneumoperitoneum - pt emergently taken to OR on 12/24 by Dr. Kallie for exploratory lap -sepsis physiology resolving and markedly improved  -Continue Zosyn  -Continue IV fluids -Lactic acid 2.9>> 1.4 - lactic acidosis resolved  -sepsis physiology resolved   Perforated abdominal viscous with pneumoperitoneum/postoperative ileus - Dr. Kallie took him to  OR emergently after Kcentra  treatment on 12/24  - continue supportive measures - NG came out x2 -NG tube replaced 02/17/2023 with 1500 cc output -Changes  medications to IV when possible -may clamp NG short time to allow for ambulation hallway -12/29--tiny BM, small flatus;  KUB with gas in colon -12/30--tiny BM, passing flatus; KUB with gas in colon -12/31--started on clears   History of PE/ Acquired thrombophilia -Patient diagnosed with PE and right lower extremity DVT 01/2020 -Felt to be secondary to underlying COVID-19 infection - last dose of apixaban  was taken 12/23 at 6pm - K-centra was given 12/24   - IV heparin  started no bolus 12/25 per surgery  -Monitor hemoglobin>>remains stable   Chronic HFrEF -02/05/2023 echo EF 55%, no WMA, normal RVF, grade 1 DD - 02/16/2020 echo EF < 20 % -Initially felt to be secondary to alcohol abuse -Entresto  on hold until patient able to tolerate p.o. -Holding Jardiance  secondary to acute infectious process -Metoprolol  succinate transition to IV Lopressor  until able to tolerate p.o. -Restart spironolactone  when able to tolerate p.o. -Appears clinically euvolemic   Leukemoid reaction - secondary to abdominal infection and surgery - following blood cultures x 2 : no growth to date  - unfortunately IV ceftriaxone  had already been given prior to blood cultures - WBC improved   Hypomagnesemia - IV replacement ordered and repleted  -keep mag >2 if possible   Depression/GAD / alcohol dependence - IV lorazepam  ordered per CIWA protocol  - supplemental vitamins per CIWA protocol  -No signs of alcohol withdrawal - will try to give cymbalta  thru NG tube if possible -Continue folic acid  and thiamine    Essential hypertension -Patient was on Entresto  and metoprolol  succinate  prior to admission -Continue IV Lopressor  for now   Normocytic anemia  - continue daily morning CBC  -Hgb stable   Hyperbilirubinemia - likely secondary to congestive  changes in the setting of alcohol dependence   Generalized abdominal pain  - severe pain being treated with IV medications - IV hydromorphone  ordered as needed    Rheumatoid arthritis  - he is relatively immunocompromised - his rheumatologist has him on prednisone    Tobacco user - nicotine  patch ordered    Sinus tachycardia  - likely having beta blocker rebound tachycardia  - IV lopressor  while NPO with hold parameters - resume home oral metoprolol  XL when able to take p.o. better   Hypokalemia -Repleted -Check magnesium  1.9         Family Communication:   spouse at bedside 12/30   Consultants:  general surgery   Code Status:  FULL    DVT Prophylaxis:  IV Heparin      Procedures: As Listed in Progress Note Above   Antibiotics: Zosyn  12/25>>12/29       Subjective: No BM today but passing flatus.  Denied f/c, cp, sob,n /v  Objective: Vitals:   02/20/23 1729 02/20/23 2048 02/21/23 0346 02/21/23 1427  BP: (!) 143/92 138/76 (!) 150/91 119/78  Pulse: 81 92 78 80  Resp: 18 19 18 18   Temp: 98.4 F (36.9 C) 98.3 F (36.8 C) 97.9 F (36.6 C) 98.2 F (36.8 C)  TempSrc: Oral Oral Oral Oral  SpO2: 98% 99% 96% 97%  Weight:   108.4 kg   Height:        Intake/Output Summary (Last 24 hours) at 02/21/2023 1755 Last data filed at 02/21/2023 1535 Gross per 24 hour  Intake 1731.52 ml  Output 1843 ml  Net -111.48 ml   Weight change: -0.4 kg Exam:  General:  Pt is alert, follows commands appropriately, not in acute distress HEENT: No icterus, No thrush, No neck mass, Gibson/AT Cardiovascular: RRR, S1/S2, no rubs, no gallops Respiratory: CTA bilaterally, no wheezing, no crackles, no rhonchi Abdomen: Soft/+BS, non tender, non distended, no guarding Extremities: No edema, No lymphangitis, No petechiae, No rashes, no synovitis   Data Reviewed: I have personally reviewed following labs and imaging studies Basic Metabolic Panel: Recent Labs  Lab 02/15/23 0513  02/16/23 0039 02/17/23 0031 02/18/23 0406 02/19/23 0348 02/20/23 0522 02/21/23 0439  NA 139 135 136 135 136 135 135  K 3.8 3.6 3.2* 3.3* 3.4* 3.2* 3.3*  CL 107 101 102 101 102 101 102  CO2 24 26 24 23 23  21* 21*  GLUCOSE 121* 103* 86 84 78 83 77  BUN 19 14 10 9 7  5* <5*  CREATININE 0.82 1.02 0.77 0.73 0.70 0.75 0.71  CALCIUM  7.8* 7.9* 8.2* 8.3* 8.0* 8.3* 8.3*  MG 1.6* 2.4  --  1.9 1.9 1.7 1.8  PHOS 4.3  --   --  2.8 2.6 3.1 2.9   Liver Function Tests: Recent Labs  Lab 02/15/23 0513 02/16/23 0039 02/17/23 0031 02/18/23 0406 02/19/23 0348  AST 23 26 21 26 30   ALT 25 29 26 26 29   ALKPHOS 31* 42 40 45 37*  BILITOT 1.2* 1.4* 1.7* 1.6* 1.5*  PROT 5.1* 5.3* 5.3* 5.8* 5.5*  ALBUMIN 2.8* 2.6* 2.6* 2.9* 2.4*   No results for input(s): LIPASE, AMYLASE in the last 168 hours. No results for input(s): AMMONIA in the last 168 hours. Coagulation Profile: Recent Labs  Lab 02/15/23 1729  INR 1.5*   CBC: Recent Labs  Lab 02/15/23 0513 02/16/23 0039 02/16/23 1221 02/17/23 0031 02/18/23 0406 02/18/23 1042 02/19/23 0348 02/20/23 0522 02/21/23 0439  WBC 14.2* 13.3*   < > 9.0 7.9  --  6.2 7.0 7.3  NEUTROABS 11.9* 10.5*  --  6.9 5.8  --  4.0  --   --   HGB 15.1 12.7*   < > 11.4* 12.8* 12.2* 11.5* 11.5* 11.1*  HCT 42.5 37.5*   < > 33.5* 38.1* 34.6* 32.9* 33.3* 33.0*  MCV 96.8 97.9   < > 98.0 97.7  --  96.5 96.2 97.6  PLT 179 160   < > 157 208  --  205 248 274   < > = values in this interval not displayed.   Cardiac Enzymes: No results for input(s): CKTOTAL, CKMB, CKMBINDEX, TROPONINI in the last 168 hours. BNP: Invalid input(s): POCBNP CBG: No results for input(s): GLUCAP in the last 168 hours. HbA1C: No results for input(s): HGBA1C in the last 72 hours. Urine analysis:    Component Value Date/Time   COLORURINE YELLOW 02/14/2023 1246   APPEARANCEUR CLEAR 02/14/2023 1246   LABSPEC 1.027 02/14/2023 1246   PHURINE 5.0 02/14/2023 1246   GLUCOSEU >=500  (A) 02/14/2023 1246   HGBUR NEGATIVE 02/14/2023 1246   BILIRUBINUR NEGATIVE 02/14/2023 1246   KETONESUR NEGATIVE 02/14/2023 1246   PROTEINUR NEGATIVE 02/14/2023 1246   NITRITE NEGATIVE 02/14/2023 1246   LEUKOCYTESUR NEGATIVE 02/14/2023 1246   Sepsis Labs: @LABRCNTIP (procalcitonin:4,lacticidven:4) ) Recent Results (from the past 240 hours)  Resp panel by RT-PCR (RSV, Flu A&B, Covid) Anterior Nasal Swab     Status: None   Collection Time: 02/14/23  1:18 PM   Specimen: Anterior Nasal Swab  Result Value Ref Range Status   SARS Coronavirus 2 by RT PCR NEGATIVE NEGATIVE Final    Comment: (NOTE) SARS-CoV-2 target nucleic acids are NOT DETECTED.  The SARS-CoV-2 RNA is generally detectable in upper respiratory specimens during the acute phase of infection. The lowest concentration of SARS-CoV-2 viral copies this assay can detect is 138 copies/mL. A negative result does not preclude SARS-Cov-2 infection and should not be used as the sole basis for treatment or other patient management decisions. A negative result may occur with  improper specimen collection/handling, submission of specimen other than nasopharyngeal swab, presence of viral mutation(s) within the areas targeted by this assay, and inadequate number of viral copies(<138 copies/mL). A negative result must be combined with clinical observations, patient history, and epidemiological information. The expected result is Negative.  Fact Sheet for Patients:  bloggercourse.com  Fact Sheet for Healthcare Providers:  seriousbroker.it  This test is no t yet approved or cleared by the [place]  FDA and  has been authorized for detection and/or diagnosis of SARS-CoV-2 by FDA under an Emergency Use Authorization (EUA). This EUA will remain  in effect (meaning this test can be used) for the duration of the COVID-19 declaration under Section 564(b)(1) of the Act, 21 U.S.C.section  360bbb-3(b)(1), unless the authorization is terminated  or revoked sooner.       Influenza A by PCR NEGATIVE NEGATIVE Final   Influenza B by PCR NEGATIVE NEGATIVE Final    Comment: (NOTE) The Xpert Xpress SARS-CoV-2/FLU/RSV plus assay is intended as an aid in the diagnosis of influenza from Nasopharyngeal swab specimens and should not be used as a sole basis for treatment. Nasal washings and aspirates are unacceptable for Xpert Xpress SARS-CoV-2/FLU/RSV testing.  Fact Sheet for Patients: bloggercourse.com  Fact Sheet for Healthcare Providers: seriousbroker.it  This test  is not yet approved or cleared by the [place]  FDA and has been authorized for detection and/or diagnosis of SARS-CoV-2 by FDA under an Emergency Use Authorization (EUA). This EUA will remain in effect (meaning this test can be used) for the duration of the COVID-19 declaration under Section 564(b)(1) of the Act, 21 U.S.C. section 360bbb-3(b)(1), unless the authorization is terminated or revoked.     Resp Syncytial Virus by PCR NEGATIVE NEGATIVE Final    Comment: (NOTE) Fact Sheet for Patients: bloggercourse.com  Fact Sheet for Healthcare Providers: seriousbroker.it  This test is not yet approved or cleared by the [place]  FDA and has been authorized for detection and/or diagnosis of SARS-CoV-2 by FDA under an Emergency Use Authorization (EUA). This EUA will remain in effect (meaning this test can be used) for the duration of the COVID-19 declaration under Section 564(b)(1) of the Act, 21 U.S.C. section 360bbb-3(b)(1), unless the authorization is terminated or revoked.  Performed at [facility], [address], [place], [place] [address]   Culture, blood (Routine X 2) w Reflex to ID Panel     Status: None   Collection Time: 02/14/23  2:20 PM   Specimen: Right Antecubital; Blood  Result Value Ref  Range Status   Specimen Description RIGHT ANTECUBITAL  Final   Special Requests   Final    BOTTLES DRAWN AEROBIC AND ANAEROBIC Blood Culture adequate volume   Culture   Final    NO GROWTH 5 DAYS Performed at [facility], [address] [place], [place] [address]    Report Status 02/19/2023 FINAL  Final  Culture, blood (Routine X 2) w Reflex to ID Panel     Status: None   Collection Time: 02/14/23  2:56 PM   Specimen: BLOOD  Result Value Ref Range Status   Specimen Description BLOOD BLOOD RIGHT FOREARM  Final   Special Requests   Final    BOTTLES DRAWN AEROBIC AND ANAEROBIC Blood Culture adequate volume   Culture   Final    NO GROWTH 5 DAYS Performed at [facility], [address] [place], [place] [address]    Report Status 02/19/2023 FINAL  Final  Aerobic/Anaerobic Culture w Gram Stain (surgical/deep wound)     Status: None   Collection Time: 02/14/23  4:17 PM   Specimen: Path fluid; Body Fluid  Result Value Ref Range Status   Specimen Description WOUND  Final   Special Requests PURLANT ASCITES  Final   Gram Stain   Final    ABUNDANT WBC PRESENT, PREDOMINANTLY PMN RARE GRAM NEGATIVE RODS    Culture   Final    FEW ESCHERICHIA COLI FEW BACTEROIDES SPECIES BETA LACTAMASE POSITIVE Performed at [facility], [address] [place], [place] [address]    Report Status 02/17/2023 FINAL  Final   Organism ID, Bacteria ESCHERICHIA COLI  Final      Susceptibility   Escherichia coli - MIC*    AMPICILLIN 4 SENSITIVE Sensitive     CEFEPIME <=0.12 SENSITIVE Sensitive     CEFTAZIDIME <=1 SENSITIVE Sensitive     CEFTRIAXONE  <=0.25 SENSITIVE Sensitive     CIPROFLOXACIN <=0.25 SENSITIVE Sensitive     GENTAMICIN <=1 SENSITIVE Sensitive     IMIPENEM <=0.25 SENSITIVE Sensitive     TRIMETH/SULFA <=20 SENSITIVE Sensitive     AMPICILLIN/SULBACTAM <=2 SENSITIVE Sensitive     PIP/TAZO <=4 SENSITIVE Sensitive ug/mL    * FEW ESCHERICHIA COLI  MRSA Next Gen by PCR, Nasal     Status:  None   Collection Time: 02/14/23  6:30 PM   Specimen: Nasal Mucosa; Nasal Swab  Result Value Ref Range Status   MRSA by PCR Next Gen NOT DETECTED NOT DETECTED Final    Comment: (NOTE) The GeneXpert MRSA Assay (FDA approved for NASAL specimens only), is one component of a comprehensive MRSA colonization surveillance program. It is not intended to diagnose MRSA infection nor to guide or monitor treatment for MRSA infections. Test performance is not FDA approved in patients less than 47 years old. Performed at [facility], [address] [place], [place] [address]      Scheduled Meds:  Chlorhexidine  Gluconate Cloth  6 each Topical Daily   colchicine   0.6 mg Oral Daily   DULoxetine   60 mg Oral Daily   feeding supplement  1 Container Oral TID BM   folic acid   1 mg Intravenous Daily   metoprolol  tartrate  2.5 mg Intravenous Q6H   multivitamin with minerals  1 tablet Oral Daily   nicotine   14 mg Transdermal q1600   pantoprazole  (PROTONIX ) IV  40 mg Intravenous Q12H   phosphorus  500 mg Oral BID   spironolactone   25 mg Oral Daily   thiamine   100 mg Oral Daily   Or   thiamine   100 mg Intravenous Daily   Continuous Infusions:  heparin  2,700 Units/hr (02/21/23 0535)   magnesium  sulfate bolus IVPB      Procedures/Studies: DG Abd 1 View Result Date: 02/20/2023 CLINICAL DATA:  Postoperative ileus and abdominal pain. EXAM: ABDOMEN - 1 VIEW COMPARISON:  February 19, 2023. FINDINGS: Surgical drains are noted in the epigastric and pelvic regions. Mildly dilated small bowel loops are noted suggesting postoperative ileus. No colonic dilatation is noted. IMPRESSION: Stable small bowel dilatation concerning for postoperative ileus. Electronically Signed   By: Lynwood Landy Raddle M.D.   On: 02/20/2023 13:13   DG Abd 1 View Result Date: 02/19/2023 CLINICAL DATA:  Abdominal pain and distention. Postop from exploratory laparotomy. EXAM: ABDOMEN - 1 VIEW COMPARISON:  None Available. FINDINGS: Surgical  drain seen in the upper abdomen and pelvis, as well as midline skin staples. Mild diffuse dilatation of small bowel loops is seen as well as mild gaseous distention of the transverse colon. This likely represents a postop ileus, with partial small bowel obstruction considered less likely. IMPRESSION: Probable postop ileus, with partial small bowel obstruction considered less likely. Electronically Signed   By: Norleen DELENA Kil M.D.   On: 02/19/2023 13:04   DG Chest Port 1 View Result Date: 02/17/2023 CLINICAL DATA:  Nasogastric tube positioning. Recent exploratory laparotomy. EXAM: PORTABLE CHEST 1 VIEW COMPARISON:  02/14/2026 FINDINGS: Stable heart size. Nasogastric tube continues to extend into the stomach which shows some persistent gaseous distension. Correlation suggested with effectiveness of wall suction and patency of the tube itself. Additional surgical Penrose drain again visible in the upper abdomen. Stable elevation of the right hemidiaphragm. Slight increase in bibasilar atelectasis. No overt edema. No pneumothorax or significant pleural fluid. IMPRESSION: 1. Nasogastric tube continues to extend into the stomach which shows some persistent gaseous distension. Correlation suggested with effectiveness of wall suction and patency of the tube itself. 2. Slight increase in bibasilar atelectasis. Electronically Signed   By: Marcey Moan M.D.   On: 02/17/2023 16:43   DG CHEST PORT 1 VIEW Result Date: 02/15/2023 CLINICAL DATA:  Enteric tube placement EXAM: PORTABLE CHEST 1 VIEW COMPARISON:  Chest radiograph dated 02/14/2023 FINDINGS: Gastric/enteric tube tip projects over the stomach. Drainage catheter is  seen projecting over the left upper quadrant. Low lung volumes with bronchovascular crowding. Bibasilar patchy opacities. Enlarged cardiomediastinal silhouette is likely projectional. No definite pneumothorax or pleural effusion. IMPRESSION: 1. Gastric/enteric tube tip projects over the stomach. 2. Low  lung volumes with bronchovascular crowding. Bibasilar patchy opacities, likely atelectasis. Electronically Signed   By: Limin  Xu M.D.   On: 02/15/2023 20:06   DG Chest Port 1 View Result Date: 02/14/2023 CLINICAL DATA:  NG tube placement EXAM: PORTABLE CHEST 1 VIEW COMPARISON:  02/14/2023 FINDINGS: NG tube tip is in the fundus of the stomach. Low lung volumes. Right base atelectasis. Left lung clear. Heart is normal size. No effusions. IMPRESSION: NG tube tip in the stomach. Low lung volumes.  Right base atelectasis. Electronically Signed   By: Franky Crease M.D.   On: 02/14/2023 19:17   DG Chest Port 1 View Result Date: 02/14/2023 CLINICAL DATA:  Shortness of breath. EXAM: PORTABLE CHEST 1 VIEW COMPARISON:  02/16/2020. FINDINGS: Low lung volume. Mild pulmonary vascular congestion, which may be accentuated by low lung volume. No frank pulmonary edema. Bilateral lung fields are otherwise clear. No acute consolidation or lung collapse. Bilateral costophrenic angles are clear. Normal cardio-mediastinal silhouette. No acute osseous abnormalities. The soft tissues are within normal limits. IMPRESSION: *Mild diffuse pulmonary vascular congestion. Otherwise, no acute cardiopulmonary abnormality. Electronically Signed   By: Ree Molt M.D.   On: 02/14/2023 13:26   CT ABDOMEN PELVIS W CONTRAST Result Date: 02/14/2023 CLINICAL DATA:  Acute generalized abdominal pain. EXAM: CT ABDOMEN AND PELVIS WITH CONTRAST TECHNIQUE: Multidetector CT imaging of the abdomen and pelvis was performed using the standard protocol following bolus administration of intravenous contrast. RADIATION DOSE REDUCTION: This exam was performed according to the departmental dose-optimization program which includes automated exposure control, adjustment of the mA and/or kV according to patient size and/or use of iterative reconstruction technique. CONTRAST:  OMNIPAQUE  IOHEXOL  300 MG/ML  SOLN COMPARISON:  February 11, 2020. FINDINGS:  Lower chest: No acute abnormality. Hepatobiliary: No focal liver abnormality is seen. No gallstones, gallbladder wall thickening, or biliary dilatation. Pancreas: Unremarkable. No pancreatic ductal dilatation or surrounding inflammatory changes. Spleen: Normal in size without focal abnormality. Adrenals/Urinary Tract: Adrenal glands appear normal. Stable left renal cyst is noted for which no further follow-up is required. No hydronephrosis or renal obstruction is noted. Urinary bladder is unremarkable. Stomach/Bowel: The stomach is unremarkable. The appendix appears normal. Mild amount of free air is noted in the epigastric region and in the pelvis consistent with rupture of hollow viscus. Mildly dilated and thick walled small bowel loops are noted in the pelvis and lower abdomen suggesting enteritis. There also appears to be mild sigmoid wall thickening suggesting possible diverticulitis. There is noted air between a small bowel loop and sigmoid colon, and therefore the source of perforation is not clearly identifiable. Vascular/Lymphatic: No significant vascular findings are present. No enlarged abdominal or pelvic lymph nodes. Reproductive: Prostate is unremarkable. Other: No ascites or hernia is noted. Musculoskeletal: No acute or significant osseous findings. IMPRESSION: Pneumoperitoneum is noted both in the epigastric and pelvic regions concerning for rupture of hollow viscus. Mildly dilated and inflamed small bowel loops are noted in lower abdomen and pelvis as well as mild sigmoid wall thickening suggesting possible inflammation there is well. It is uncertain if the pneumoperitoneum is secondary to ruptured small or large bowel. Critical Value/emergent results were called by telephone at the time of interpretation on 02/14/2023 at 1:22 pm to provider JOSEPH ZAMMIT , who verbally  acknowledged these results. Electronically Signed   By: Lynwood Landy Raddle M.D.   On: 02/14/2023 13:24   ECHOCARDIOGRAM  COMPLETE Result Date: 01/26/2023    ECHOCARDIOGRAM REPORT   Patient Name:   KEMONTE ULLMAN Date of Exam: 01/26/2023 Medical Rec #:  [id number]           Height:       74.0 in Accession #:    [id number]          Weight:       247.0 lb Date of Birth:  [birth year]          BSA:          2.377 m Patient Age:    47 years            BP:           122/99 mmHg Patient Gender: M                   HR:           72 bpm. Exam Location:  [facility] Procedure: 2D Echo, 3D Echo, Cardiac Doppler, Color Doppler and Strain Analysis Indications:    I50.40* Unspecified combined systolic (congestive) and diastolic                 (congestive) heart failure  History:        Patient has prior history of Echocardiogram examinations, most                 recent 02/16/2020. Cardiomyopathy and CHF,                 Signs/Symptoms:Shortness of Breath, Dyspnea and Fatigue; Risk                 Factors:Hypertension and Dyslipidemia. Pulmonary embolus.  Sonographer:    Ellouise Mose RDCS Referring Phys: 53 TESSA N CONTE  Sonographer Comments: Image acquisition challenging due to patient body habitus and Image acquisition challenging due to respiratory motion. IMPRESSIONS  1. Left ventricular ejection fraction, by estimation, is 50 to 55%. The left ventricle has low normal function. The left ventricle has no regional wall motion abnormalities. The left ventricular internal cavity size was mildly dilated. There is mild concentric left ventricular hypertrophy. Left ventricular diastolic parameters are consistent with Grade I diastolic dysfunction (impaired relaxation). The average left ventricular global longitudinal strain is -19.3 %. The global longitudinal strain is normal.  2. Right ventricular systolic function is normal. The right ventricular size is normal. Tricuspid regurgitation signal is inadequate for assessing PA pressure.  3. The mitral valve is grossly normal. Mild mitral valve regurgitation.  4. The aortic valve is tricuspid. Aortic  valve regurgitation is not visualized. No aortic stenosis is present. Aortic valve mean gradient measures 3.0 mmHg.  5. Aortic dilatation noted. There is moderate dilatation of the aortic root, measuring 44 mm.  6. The inferior vena cava is normal in size with greater than 50% respiratory variability, suggesting right atrial pressure of 3 mmHg. Comparison(s): Prior images reviewed side by side. LVEF improved significantly in comparison, now 50-55% range. Mitral regurgitation is mild. Moderately dilated aortic root. FINDINGS  Left Ventricle: Left ventricular ejection fraction, by estimation, is 50 to 55%. The left ventricle has low normal function. The left ventricle has no regional wall motion abnormalities. The average left ventricular global longitudinal strain is -19.3 %. The global longitudinal strain is normal. The left ventricular internal cavity size was mildly dilated. There is mild  concentric left ventricular hypertrophy. Left ventricular diastolic parameters are consistent with Grade I diastolic dysfunction (impaired relaxation). Right Ventricle: The right ventricular size is normal. No increase in right ventricular wall thickness. Right ventricular systolic function is normal. Tricuspid regurgitation signal is inadequate for assessing PA pressure. Left Atrium: Left atrial size was normal in size. Right Atrium: Right atrial size was normal in size. Pericardium: There is no evidence of pericardial effusion. Presence of epicardial fat layer. Mitral Valve: The mitral valve is grossly normal. Mild mitral valve regurgitation. Tricuspid Valve: The tricuspid valve is grossly normal. Tricuspid valve regurgitation is trivial. Aortic Valve: The aortic valve is tricuspid. There is mild aortic valve annular calcification. Aortic valve regurgitation is not visualized. No aortic stenosis is present. Aortic valve mean gradient measures 3.0 mmHg. Aortic valve peak gradient measures 5.1 mmHg. Aortic valve area, by VTI  measures 3.51 cm. Pulmonic Valve: The pulmonic valve was grossly normal. Pulmonic valve regurgitation is trivial. Aorta: Aortic dilatation noted. There is moderate dilatation of the aortic root, measuring 44 mm. Venous: The inferior vena cava is normal in size with greater than 50% respiratory variability, suggesting right atrial pressure of 3 mmHg. IAS/Shunts: No atrial level shunt detected by color flow Doppler.  LEFT VENTRICLE PLAX 2D LVIDd:         6.20 cm      Diastology LVIDs:         5.20 cm      LV e' medial:    4.46 cm/s LV PW:         1.10 cm      LV E/e' medial:  11.1 LV IVS:        0.90 cm      LV e' lateral:   8.70 cm/s LVOT diam:     2.40 cm      LV E/e' lateral: 5.7 LV SV:         72 LV SV Index:   30           2D Longitudinal Strain LVOT Area:     4.52 cm     2D Strain GLS Avg:     -19.3 %  LV Volumes (MOD) LV vol d, MOD A2C: 138.0 ml 3D Volume EF: LV vol d, MOD A4C: 113.0 ml 3D EF:        51 % LV vol s, MOD A2C: 62.5 ml  LV EDV:       214 ml LV vol s, MOD A4C: 55.6 ml  LV ESV:       105 ml LV SV MOD A2C:     75.5 ml  LV SV:        109 ml LV SV MOD A4C:     113.0 ml LV SV MOD BP:      66.7 ml RIGHT VENTRICLE RV S prime:     10.90 cm/s TAPSE (M-mode): 2.1 cm LEFT ATRIUM             Index        RIGHT ATRIUM           Index LA diam:        2.30 cm 0.97 cm/m   RA Area:     11.90 cm LA Vol (A2C):   71.1 ml 29.91 ml/m  RA Volume:   26.30 ml  11.06 ml/m LA Vol (A4C):   37.9 ml 15.94 ml/m LA Biplane Vol: 54.7 ml 23.01 ml/m  AORTIC VALVE AV Area (Vmax):    3.35  cm AV Area (Vmean):   3.31 cm AV Area (VTI):     3.51 cm AV Vmax:           113.00 cm/s AV Vmean:          73.300 cm/s AV VTI:            0.206 m AV Peak Grad:      5.1 mmHg AV Mean Grad:      3.0 mmHg LVOT Vmax:         83.70 cm/s LVOT Vmean:        53.600 cm/s LVOT VTI:          0.160 m LVOT/AV VTI ratio: 0.78  AORTA Ao Root diam: 4.40 cm Ao Asc diam:  3.65 cm MITRAL VALVE MV Area (PHT): 3.21 cm    SHUNTS MV Decel Time: 236 msec     Systemic VTI:  0.16 m MV E velocity: 49.70 cm/s  Systemic Diam: 2.40 cm MV A velocity: 72.80 cm/s MV E/A ratio:  0.68 Jayson Sierras MD Electronically signed by Jayson Sierras MD Signature Date/Time: 01/26/2023/4:11:39 PM    Final     Alm Schneider, DO  [facility]  If 7PM-7AM, please contact night-coverage [email or web address] Password [email or web address] 02/21/2023, 5:55 PM   LOS: 7 days

## 2023-02-22 DIAGNOSIS — D6869 Other thrombophilia: Secondary | ICD-10-CM | POA: Diagnosis not present

## 2023-02-22 DIAGNOSIS — R198 Other specified symptoms and signs involving the digestive system and abdomen: Secondary | ICD-10-CM | POA: Diagnosis not present

## 2023-02-22 DIAGNOSIS — A419 Sepsis, unspecified organism: Secondary | ICD-10-CM | POA: Diagnosis not present

## 2023-02-22 DIAGNOSIS — R1084 Generalized abdominal pain: Secondary | ICD-10-CM | POA: Diagnosis not present

## 2023-02-22 LAB — CBC
HCT: 32.6 % — ABNORMAL LOW (ref 39.0–52.0)
Hemoglobin: 11 g/dL — ABNORMAL LOW (ref 13.0–17.0)
MCH: 32.6 pg (ref 26.0–34.0)
MCHC: 33.7 g/dL (ref 30.0–36.0)
MCV: 96.7 fL (ref 80.0–100.0)
Platelets: 285 10*3/uL (ref 150–400)
RBC: 3.37 MIL/uL — ABNORMAL LOW (ref 4.22–5.81)
RDW: 13.6 % (ref 11.5–15.5)
WBC: 7.1 10*3/uL (ref 4.0–10.5)
nRBC: 0 % (ref 0.0–0.2)

## 2023-02-22 LAB — BASIC METABOLIC PANEL
Anion gap: 8 (ref 5–15)
BUN: <5 mg/dL — ABNORMAL LOW (ref 6–20)
CO2: 27 mmol/L (ref 22–32)
Calcium: 8.5 mg/dL — ABNORMAL LOW (ref 8.9–10.3)
Chloride: 99 mmol/L (ref 98–111)
Creatinine, Ser: 0.65 mg/dL (ref 0.61–1.24)
GFR, Estimated: >60 mL/min (ref >60)
Glucose, Bld: 102 mg/dL — ABNORMAL HIGH (ref 70–99)
Potassium: 3.3 mmol/L — ABNORMAL LOW (ref 3.5–5.1)
Sodium: 134 mmol/L — ABNORMAL LOW (ref 135–145)

## 2023-02-22 LAB — HEPARIN LEVEL (UNFRACTIONATED): Heparin Unfractionated: 0.56 [IU]/mL (ref 0.30–0.70)

## 2023-02-22 LAB — PHOSPHORUS: Phosphorus: 3.9 mg/dL (ref 2.5–4.6)

## 2023-02-22 LAB — MAGNESIUM: Magnesium: 1.7 mg/dL (ref 1.7–2.4)

## 2023-02-22 MED ORDER — POTASSIUM CHLORIDE 10 MEQ/100ML IV SOLN
10.0000 meq | INTRAVENOUS | Status: AC
  Administered 2023-02-22 (×4): 10 meq via INTRAVENOUS
  Filled 2023-02-22 (×4): qty 100

## 2023-02-22 MED ORDER — HYDRALAZINE HCL 20 MG/ML IJ SOLN
10.0000 mg | INTRAMUSCULAR | Status: DC | PRN
  Administered 2023-02-22: 10 mg via INTRAVENOUS
  Filled 2023-02-22: qty 1

## 2023-02-22 MED ORDER — HYDROMORPHONE HCL 1 MG/ML IJ SOLN
0.5000 mg | INTRAMUSCULAR | Status: DC | PRN
  Administered 2023-02-22 – 2023-02-23 (×5): 0.5 mg via INTRAVENOUS
  Filled 2023-02-22 (×6): qty 0.5

## 2023-02-22 MED ORDER — POLYETHYLENE GLYCOL 3350 17 G PO PACK
17.0000 g | PACK | Freq: Every day | ORAL | Status: DC
Start: 2023-02-22 — End: 2023-02-24
  Administered 2023-02-22: 17 g via ORAL
  Filled 2023-02-22 (×2): qty 1

## 2023-02-22 MED ORDER — MAGNESIUM SULFATE 4 GM/100ML IV SOLN
4.0000 g | Freq: Once | INTRAVENOUS | Status: AC
  Administered 2023-02-22: 4 g via INTRAVENOUS
  Filled 2023-02-22: qty 100

## 2023-02-22 MED ORDER — METOCLOPRAMIDE HCL 5 MG/ML IJ SOLN
10.0000 mg | Freq: Four times a day (QID) | INTRAMUSCULAR | Status: AC
  Administered 2023-02-22 – 2023-02-24 (×8): 10 mg via INTRAVENOUS
  Filled 2023-02-22 (×8): qty 2

## 2023-02-22 MED ORDER — BISACODYL 10 MG RE SUPP
10.0000 mg | Freq: Every day | RECTAL | Status: DC | PRN
  Filled 2023-02-22: qty 1

## 2023-02-22 MED ORDER — SIMETHICONE 80 MG PO CHEW
80.0000 mg | CHEWABLE_TABLET | Freq: Four times a day (QID) | ORAL | Status: DC | PRN
  Administered 2023-02-22: 80 mg via ORAL
  Filled 2023-02-22: qty 1

## 2023-02-22 MED ORDER — FOLIC ACID 1 MG PO TABS
1.0000 mg | ORAL_TABLET | Freq: Every day | ORAL | Status: DC
  Filled 2023-02-22 (×3): qty 1

## 2023-02-22 NOTE — Consult Note (Signed)
.  Pharmacy Consult Note - Anticoagulation  Pharmacy Consult for Heparin  Indication:  history of pulmonary embolism  PATIENT MEASUREMENTS: Height: 6' 2 (188 cm) Weight: 108.5 kg (239 lb 3.2 oz) IBW/kg (Calculated) : 82.2 HEPARIN  DW (KG): 95.8  VITAL SIGNS: Temp: 98 F (36.7 C) (01/01 0434) Temp Source: Oral (01/01 0434) BP: 142/99 (01/01 0500) Pulse Rate: 82 (01/01 0500)  Recent Labs    02/22/23 0433  HGB 11.0*  HCT 32.6*  PLT 285  HEPARINUNFRC 0.56  CREATININE 0.65    Estimated Creatinine Clearance: 149.7 mL/min (by C-G formula based on SCr of 0.65 mg/dL).  PAST MEDICAL HISTORY: Past Medical History:  Diagnosis Date   Asthma    AS A CHILD ONLY   DVT (deep venous thrombosis) (HCC)    Foot pain, right    painful x 1 month and swelling   Idiopathic gout     ASSESSMENT: 48 y.o. male with PMH including pulmonary embolism in 2023 is presenting with a bowel perforation, and is now s/p exploratory laparotomy. Patient is on chronic anticoagulation with Eliquis . Last dose was 02/13/2023 in the evening. Patient received Kcentra  on 12/24 due to need for emergent procedure. Pharmacy has been consulted to initiate and manage heparin  intravenous infusion.  Heparin  level continues to be therapeutic (0.56) on 2700 units/hr. No bleeding or IV issues noted. CBC stable.   Goal(s) of therapy: Heparin  level 0.3-0.7 units/mL Monitor platelets by anticoagulation protocol: Yes   PLAN: Continue heparin  infusion at 2700 units/hr Heparin  level next with tomorrow AM labs Caution regarding boluses due to recent surgery  Dempsey Blush PharmD., BCPS Clinical Pharmacist 02/22/2023 9:26 AM

## 2023-02-22 NOTE — Progress Notes (Signed)
 8 Days Post-Op  Subjective: Patient still having flatus but no significant bowel movement yet.  Does get occasionally nauseated.  NG tube is clamped.  Objective: Vital signs in last 24 hours: Temp:  [98 F (36.7 C)-98.2 F (36.8 C)] 98 F (36.7 C) (01/01 0434) Pulse Rate:  [77-88] 82 (01/01 0500) Resp:  [18] 18 (01/01 0434) BP: (119-143)/(78-99) 142/99 (01/01 0500) SpO2:  [97 %-99 %] 98 % (01/01 0434) Weight:  [108.5 kg] 108.5 kg (01/01 0434) Last BM Date : 02/20/23  Intake/Output from previous day: 12/31 0701 - 01/01 0700 In: 308 [IV Piggyback:308] Out: 142.5 [Drains:142.5] Intake/Output this shift: No intake/output data recorded.  General appearance: alert, cooperative, and no distress Resp: clear to auscultation bilaterally Cardio: regular rate and rhythm, S1, S2 normal, no murmur, click, rub or gallop GI: Soft with minimal bowel sounds appreciated.  Incision healing well.  Upper JP drain with serous drainage present.  Lab Results:  Recent Labs    02/21/23 0439 02/22/23 0433  WBC 7.3 7.1  HGB 11.1* 11.0*  HCT 33.0* 32.6*  PLT 274 285   BMET Recent Labs    02/21/23 0439 02/22/23 0433  NA 135 134*  K 3.3* 3.3*  CL 102 99  CO2 21* 27  GLUCOSE 77 102*  BUN <5* <5*  CREATININE 0.71 0.65  CALCIUM  8.3* 8.5*   PT/INR No results for input(s): LABPROT, INR in the last 72 hours.  Studies/Results: No results found.  Anti-infectives: Anti-infectives (From admission, onward)    Start     Dose/Rate Route Frequency Ordered Stop   02/15/23 0600  cefoTEtan  (CEFOTAN ) 2 g in sodium chloride  0.9 % 100 mL IVPB        2 g 200 mL/hr over 30 Minutes Intravenous On call to O.R. 02/14/23 1442 02/14/23 1625   02/15/23 0600  piperacillin -tazobactam (ZOSYN ) IVPB 3.375 g        3.375 g 12.5 mL/hr over 240 Minutes Intravenous Every 8 hours 02/14/23 2125 02/19/23 2359   02/15/23 0000  piperacillin -tazobactam (ZOSYN ) IVPB 3.375 g  Status:  Discontinued        3.375 g 12.5  mL/hr over 240 Minutes Intravenous Every 8 hours 02/14/23 1606 02/14/23 2116   02/14/23 2230  piperacillin -tazobactam (ZOSYN ) IVPB 3.375 g  Status:  Discontinued        3.375 g 12.5 mL/hr over 240 Minutes Intravenous Every 8 hours 02/14/23 2116 02/14/23 2125   02/14/23 2215  piperacillin -tazobactam (ZOSYN ) IVPB 3.375 g        3.375 g 12.5 mL/hr over 240 Minutes Intravenous STAT 02/14/23 2124 02/15/23 0135   02/14/23 1504  sodium chloride  0.9 % with cefoTEtan  (CEFOTAN ) ADS Med       Note to Pharmacy: Sherre Constant N: cabinet override      02/14/23 1504 02/14/23 1625   02/14/23 1500  piperacillin -tazobactam (ZOSYN ) IVPB 3.375 g  Status:  Discontinued        3.375 g 100 mL/hr over 30 Minutes Intravenous  Once 02/14/23 1446 02/14/23 2125   02/14/23 1345  cefTRIAXone  (ROCEPHIN ) 2 g in sodium chloride  0.9 % 100 mL IVPB        2 g 200 mL/hr over 30 Minutes Intravenous  Once 02/14/23 1341 02/14/23 1443       Assessment/Plan: s/p Procedure(s): EXPLORATORY LAPAROTOMY, wash out, drain placement Impression: Postoperative ileus.  I did hook the NG tube back up to suction and got 900 cc of dark bilious fluid.  I wonder whether there is a component of  gastroparesis present.  Will start Reglan  10 mg IV every 6 hours x 48 hours.  May continue with full liquid diet.  Potassium has been supplemented.  LOS: 8 days    Ryan Gibson 02/22/2023

## 2023-02-22 NOTE — Progress Notes (Signed)
 Pt with nausea and abdominal pain throughout shift, PRN anti-emetics and pain meds given as charted. 18:00, pt vomited and IV compazine  given. Hooked NGT up to suction with brown/red output. NGT clamped and MD Vicci and Mavis informed. Placed NGT to LIWS per MD Mavis orders.

## 2023-02-22 NOTE — Progress Notes (Signed)
 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  He presented from home by EMS complaining of severe generalized abdominal pain with nausea vomiting and diarrhea that started early this morning.  The pain is 10/10.  He feels very ill and having palpitations.  He denies fever and chills.  In the ED he was noted to have a rigid abdomen and sent for CT abdomen pelvis with findings of concern for a perforated abdominal viscus.  Emergent surgical consultation requested.  He is started on IV antibiotics and IV fluids and admission requested for further management. The patient underwent exploratory laparotomy for his pneumoperitoneum on 02/14/2023.  A JP drain was placed over the stomach and into the pelvis.  General surgery continue to follow-up for postoperative care.  His hospitalization has been prolonged secondary to patient removal of his NG tube and postoperative ileus. NG tube was replaced on 02/17/2023 putting out 1500 cc.   Assessment and Plan:  Sepsis from intraabdominal infection/pneumoperitoneum - pt emergently taken to OR on 12/24 by Dr. Kallie for exploratory lap -sepsis physiology resolving and markedly improved  -completed Zosyn  -Continue IV fluids -Lactic acid 2.9>> 1.4 -lactic acidosis resolved  -sepsis  physiology resolved   Perforated abdominal viscous with pneumoperitoneum/postoperative ileus -Dr. Kallie took him to OR emergently after Kcentra  treatment on 12/24  -continue supportive measures -NG came out x2 -NG tube replaced 02/17/2023 with 1500 cc output -NG put out at least 900 cc today -may clamp NG short time to allow for ambulation hallway -12/29--tiny BM, small flatus;  KUB with gas in colon -12/30--tiny BM, passing flatus; KUB with gas in colon -12/31--started on clears, now on full liquids  -1/1 -- de-escalating IV opioid medication; reduced hydromorphone  to 0.5 mg Q3 hours PRN with plan to further de-escalate tomorrow if able    History of PE/ Acquired thrombophilia -Patient diagnosed with PE and right lower extremity DVT 01/2020 -Felt to be secondary to underlying COVID-19 infection - last dose of apixaban  was taken 12/23 at 6pm - K-centra was given 12/24   - IV heparin  started no bolus 12/25 per surgery  -Monitor hemoglobin>>remains stable   Chronic HFrEF -02/05/2023 echo EF 55%, no WMA, normal RVF, grade 1 DD -02/16/2020 echo EF < 20 % -Initially felt to be secondary to alcohol abuse -Entresto  on hold until patient able to tolerate p.o. -Holding Jardiance  secondary to acute infectious process -Metoprolol  succinate transition to IV Lopressor  until able to tolerate p.o. -Restart spironolactone  when able to tolerate p.o. -Appears clinically euvolemic   Leukemoid reaction - RESOLVED  - secondary to abdominal infection and surgery - following blood cultures x 2 : no growth to date  - unfortunately IV ceftriaxone  had already been given prior to blood cultures - WBC improved to normal    Hypomagnesemia - IV replacement ordered and repleted  - keep mag >2 if possible, additional  4 gm IV given 1/1    Depression/GAD / alcohol dependence - IV lorazepam  ordered per CIWA protocol  - supplemental vitamins per CIWA protocol  - No signs of alcohol withdrawal - will try  to give cymbalta  thru NG tube if possible - Continue folic acid  and thiamine    Essential hypertension -Patient was on Entresto  and metoprolol  succinate prior to admission -Continue IV Lopressor  for now   Normocytic anemia  - continue daily morning CBC  -Hgb stable   Hyperbilirubinemia - likely secondary to congestive changes in the setting of alcohol dependence   Generalized abdominal pain  - severe pain being treated with IV medications - IV hydromorphone  ordered as needed    Rheumatoid arthritis  - he is relatively immunocompromised - his rheumatologist has him on prednisone    Tobacco user - nicotine  patch ordered if needed for cravings    Sinus tachycardia  - likely having beta blocker rebound tachycardia  - IV lopressor  while NPO with hold parameters - resume home oral metoprolol  XL when able to take p.o. better   Hypokalemia -Repleted additional IV Mg given 1/1 -Check magnesium  in AM    DVT prophylaxis: IV heparin  Code Status: Full  Family Communication:  Disposition: home    Consultants:  Surgery  Procedures:  Exp lap, open lesser sac and greater sac, JP drain placement 02/14/23   Antimicrobials:    Subjective: Pt feels bloated in the abdomen; still having nausea; having flatus and small BM  Objective: Vitals:   02/22/23 0000 02/22/23 0434 02/22/23 0500 02/22/23 1349  BP: (!) 143/97 (!) 138/91 (!) 142/99 (!) 155/106  Pulse: 83 77 82 81  Resp:  18  17  Temp:  98 F (36.7 C)    TempSrc:  Oral    SpO2:  98%  99%  Weight:  108.5 kg    Height:        Intake/Output Summary (Last 24 hours) at 02/22/2023 1553 Last data filed at 02/22/2023 1300 Gross per 24 hour  Intake 310.03 ml  Output 1367.5 ml  Net -1057.47 ml   Filed Weights   02/20/23 0505 02/21/23 0346 02/22/23 0434  Weight: 108.8 kg 108.4 kg 108.5 kg   Examination:  General exam: Appears calm and comfortable  Respiratory system: Clear to auscultation. Respiratory effort  normal. Cardiovascular system: normal S1 & S2 heard. No JVD, murmurs, rubs, gallops or clicks. No pedal edema. Gastrointestinal system: Abdomen is mildly distended, soft and nontender. No organomegaly or masses felt. Hypoactive bowel sounds heard. Central nervous system: Alert and oriented. No focal neurological deficits. Extremities: Symmetric 5 x 5 power. Skin: No rashes, lesions or ulcers. Psychiatry: Judgement and insight appear normal. Mood & affect appropriate.   Data Reviewed: I have personally reviewed following labs and imaging studies  CBC: Recent Labs  Lab 02/16/23 0039 02/16/23 1221 02/17/23 0031 02/18/23 0406 02/18/23 1042 02/19/23 0348 02/20/23 0522 02/21/23 0439 02/22/23 0433  WBC 13.3*   < > 9.0 7.9  --  6.2 7.0 7.3 7.1  NEUTROABS 10.5*  --  6.9 5.8  --  4.0  --   --   --   HGB 12.7*   < > 11.4* 12.8* 12.2* 11.5* 11.5* 11.1* 11.0*  HCT 37.5*   < > 33.5* 38.1* 34.6* 32.9* 33.3* 33.0* 32.6*  MCV 97.9   < > 98.0 97.7  --  96.5 96.2 97.6 96.7  PLT 160   < > 157 208  --  205 248 274 285   < > =  values in this interval not displayed.    Basic Metabolic Panel: Recent Labs  Lab 02/18/23 0406 02/19/23 0348 02/20/23 0522 02/21/23 0439 02/22/23 0433  NA 135 136 135 135 134*  K 3.3* 3.4* 3.2* 3.3* 3.3*  CL 101 102 101 102 99  CO2 23 23 21* 21* 27  GLUCOSE 84 78 83 77 102*  BUN 9 7 5* <5* <5*  CREATININE 0.73 0.70 0.75 0.71 0.65  CALCIUM  8.3* 8.0* 8.3* 8.3* 8.5*  MG 1.9 1.9 1.7 1.8 1.7  PHOS 2.8 2.6 3.1 2.9 3.9    CBG: No results for input(s): GLUCAP in the last 168 hours.  Recent Results (from the past 240 hours)  Resp panel by RT-PCR (RSV, Flu A&B, Covid) Anterior Nasal Swab     Status: None   Collection Time: 02/14/23  1:18 PM   Specimen: Anterior Nasal Swab  Result Value Ref Range Status   SARS Coronavirus 2 by RT PCR NEGATIVE NEGATIVE Final    Comment: (NOTE) SARS-CoV-2 target nucleic acids are NOT DETECTED.  The SARS-CoV-2 RNA is generally  detectable in upper respiratory specimens during the acute phase of infection. The lowest concentration of SARS-CoV-2 viral copies this assay can detect is 138 copies/mL. A negative result does not preclude SARS-Cov-2 infection and should not be used as the sole basis for treatment or other patient management decisions. A negative result may occur with  improper specimen collection/handling, submission of specimen other than nasopharyngeal swab, presence of viral mutation(s) within the areas targeted by this assay, and inadequate number of viral copies(<138 copies/mL). A negative result must be combined with clinical observations, patient history, and epidemiological information. The expected result is Negative.  Fact Sheet for Patients:  bloggercourse.com  Fact Sheet for Healthcare Providers:  seriousbroker.it  This test is no t yet approved or cleared by the [place]  FDA and  has been authorized for detection and/or diagnosis of SARS-CoV-2 by FDA under an Emergency Use Authorization (EUA). This EUA will remain  in effect (meaning this test can be used) for the duration of the COVID-19 declaration under Section 564(b)(1) of the Act, 21 U.S.C.section 360bbb-3(b)(1), unless the authorization is terminated  or revoked sooner.       Influenza A by PCR NEGATIVE NEGATIVE Final   Influenza B by PCR NEGATIVE NEGATIVE Final    Comment: (NOTE) The Xpert Xpress SARS-CoV-2/FLU/RSV plus assay is intended as an aid in the diagnosis of influenza from Nasopharyngeal swab specimens and should not be used as a sole basis for treatment. Nasal washings and aspirates are unacceptable for Xpert Xpress SARS-CoV-2/FLU/RSV testing.  Fact Sheet for Patients: bloggercourse.com  Fact Sheet for Healthcare Providers: seriousbroker.it  This test is not yet approved or cleared by the [place]  FDA  and has been authorized for detection and/or diagnosis of SARS-CoV-2 by FDA under an Emergency Use Authorization (EUA). This EUA will remain in effect (meaning this test can be used) for the duration of the COVID-19 declaration under Section 564(b)(1) of the Act, 21 U.S.C. section 360bbb-3(b)(1), unless the authorization is terminated or revoked.     Resp Syncytial Virus by PCR NEGATIVE NEGATIVE Final    Comment: (NOTE) Fact Sheet for Patients: bloggercourse.com  Fact Sheet for Healthcare Providers: seriousbroker.it  This test is not yet approved or cleared by the [place]  FDA and has been authorized for detection and/or diagnosis of SARS-CoV-2 by FDA under an Emergency Use Authorization (EUA). This EUA will remain in effect (meaning this test can be used)  for the duration of the COVID-19 declaration under Section 564(b)(1) of the Act, 21 U.S.C. section 360bbb-3(b)(1), unless the authorization is terminated or revoked.  Performed at [facility], [address], [place], [place] [address]   Culture, blood (Routine X 2) w Reflex to ID Panel     Status: None   Collection Time: 02/14/23  2:20 PM   Specimen: Right Antecubital; Blood  Result Value Ref Range Status   Specimen Description RIGHT ANTECUBITAL  Final   Special Requests   Final    BOTTLES DRAWN AEROBIC AND ANAEROBIC Blood Culture adequate volume   Culture   Final    NO GROWTH 5 DAYS Performed at [facility], [address] [place], [place] [address]    Report Status 02/19/2023 FINAL  Final  Culture, blood (Routine X 2) w Reflex to ID Panel     Status: None   Collection Time: 02/14/23  2:56 PM   Specimen: BLOOD  Result Value Ref Range Status   Specimen Description BLOOD BLOOD RIGHT FOREARM  Final   Special Requests   Final    BOTTLES DRAWN AEROBIC AND ANAEROBIC Blood Culture adequate volume   Culture   Final    NO GROWTH 5 DAYS Performed at [facility],  [address] [place], [place] [address]    Report Status 02/19/2023 FINAL  Final  Aerobic/Anaerobic Culture w Gram Stain (surgical/deep wound)     Status: None   Collection Time: 02/14/23  4:17 PM   Specimen: Path fluid; Body Fluid  Result Value Ref Range Status   Specimen Description WOUND  Final   Special Requests PURLANT ASCITES  Final   Gram Stain   Final    ABUNDANT WBC PRESENT, PREDOMINANTLY PMN RARE GRAM NEGATIVE RODS    Culture   Final    FEW ESCHERICHIA COLI FEW BACTEROIDES SPECIES BETA LACTAMASE POSITIVE Performed at [facility], [address], [place], [place] [address]    Report Status 02/17/2023 FINAL  Final   Organism ID, Bacteria ESCHERICHIA COLI  Final      Susceptibility   Escherichia coli - MIC*    AMPICILLIN 4 SENSITIVE Sensitive     CEFEPIME <=0.12 SENSITIVE Sensitive     CEFTAZIDIME <=1 SENSITIVE Sensitive     CEFTRIAXONE  <=0.25 SENSITIVE Sensitive     CIPROFLOXACIN <=0.25 SENSITIVE Sensitive     GENTAMICIN <=1 SENSITIVE Sensitive     IMIPENEM <=0.25 SENSITIVE Sensitive     TRIMETH/SULFA <=20 SENSITIVE Sensitive     AMPICILLIN/SULBACTAM <=2 SENSITIVE Sensitive     PIP/TAZO <=4 SENSITIVE Sensitive ug/mL    * FEW ESCHERICHIA COLI  MRSA Next Gen by PCR, Nasal     Status: None   Collection Time: 02/14/23  6:30 PM   Specimen: Nasal Mucosa; Nasal Swab  Result Value Ref Range Status   MRSA by PCR Next Gen NOT DETECTED NOT DETECTED Final    Comment: (NOTE) The GeneXpert MRSA Assay (FDA approved for NASAL specimens only), is one component of a comprehensive MRSA colonization surveillance program. It is not intended to diagnose MRSA infection nor to guide or monitor treatment for MRSA infections. Test performance is not FDA approved in patients less than 52 years old. Performed at [facility], [address], [place], [place] [address]      Radiology Studies: No results found.  Scheduled Meds:  Chlorhexidine  Gluconate Cloth  6 each Topical Daily    colchicine   0.6 mg Oral Daily   DULoxetine   60 mg Oral  Daily   feeding supplement  1 Container Oral TID BM   [START ON 02/23/2023] folic acid   1 mg Oral Daily   metoCLOPramide  (REGLAN ) injection  10 mg Intravenous Q6H   metoprolol  tartrate  2.5 mg Intravenous Q6H   multivitamin with minerals  1 tablet Oral Daily   nicotine   14 mg Transdermal q1600   pantoprazole  (PROTONIX ) IV  40 mg Intravenous Q12H   phosphorus  500 mg Oral BID   polyethylene glycol  17 g Oral Daily   spironolactone   25 mg Oral Daily   thiamine   100 mg Oral Daily   Or   thiamine   100 mg Intravenous Daily   Continuous Infusions:  heparin  2,700 Units/hr (02/22/23 0037)   magnesium  sulfate bolus IVPB 4 g (02/22/23 1414)     LOS: 8 days   Time spent: 47 mins  Henslee Lottman Vicci, MD How to contact the [facility] Attending or Consulting provider 7A - 7P or covering provider during after hours 7P -7A, for this patient?  Check the care team in [facility] and look for a) attending/consulting [facility] provider listed and b) the [facility] team listed Log into [email or web address] to find provider on call.  Locate the [facility] provider you are looking for under [facility] and page to a number that you can be directly reached. If you still have difficulty reaching the provider, please page the [facility] (Director on Call) for the Hospitalists listed on amion for assistance.  02/22/2023, 3:53 PM

## 2023-02-22 NOTE — Plan of Care (Signed)
 No nausea, no vomiting during the shift. Abdomen tender. Positive bowel sounds and passing gas.   Problem: Education: Goal: Knowledge of General Education information will improve Description: Including pain rating scale, medication(s)/side effects and non-pharmacologic comfort measures Outcome: Progressing   Problem: Health Behavior/Discharge Planning: Goal: Ability to manage health-related needs will improve Outcome: Progressing   Problem: Clinical Measurements: Goal: Ability to maintain clinical measurements within normal limits will improve Outcome: Progressing Goal: Will remain free from infection Outcome: Progressing Goal: Diagnostic test results will improve Outcome: Progressing Goal: Respiratory complications will improve Outcome: Progressing Goal: Cardiovascular complication will be avoided Outcome: Progressing   Problem: Activity: Goal: Risk for activity intolerance will decrease Outcome: Progressing   Problem: Nutrition: Goal: Adequate nutrition will be maintained Outcome: Progressing   Problem: Coping: Goal: Level of anxiety will decrease Outcome: Progressing   Problem: Elimination: Goal: Will not experience complications related to bowel motility Outcome: Progressing Goal: Will not experience complications related to urinary retention Outcome: Progressing   Problem: Safety: Goal: Ability to remain free from injury will improve Outcome: Progressing   Problem: Skin Integrity: Goal: Risk for impaired skin integrity will decrease Outcome: Progressing   Problem: Safety: Goal: Non-violent Restraint(s) Outcome: Progressing   Problem: Pain Management: Goal: General experience of comfort will improve Outcome: Not Progressing

## 2023-02-23 ENCOUNTER — Inpatient Hospital Stay (HOSPITAL_COMMUNITY): Payer: BC Managed Care – PPO

## 2023-02-23 ENCOUNTER — Other Ambulatory Visit: Payer: Self-pay

## 2023-02-23 ENCOUNTER — Encounter (HOSPITAL_COMMUNITY): Payer: Self-pay | Admitting: Family Medicine

## 2023-02-23 DIAGNOSIS — K567 Ileus, unspecified: Secondary | ICD-10-CM | POA: Diagnosis not present

## 2023-02-23 DIAGNOSIS — R198 Other specified symptoms and signs involving the digestive system and abdomen: Secondary | ICD-10-CM | POA: Diagnosis not present

## 2023-02-23 DIAGNOSIS — K920 Hematemesis: Secondary | ICD-10-CM

## 2023-02-23 DIAGNOSIS — K9189 Other postprocedural complications and disorders of digestive system: Secondary | ICD-10-CM | POA: Diagnosis not present

## 2023-02-23 DIAGNOSIS — K6389 Other specified diseases of intestine: Secondary | ICD-10-CM

## 2023-02-23 DIAGNOSIS — A419 Sepsis, unspecified organism: Secondary | ICD-10-CM | POA: Diagnosis not present

## 2023-02-23 DIAGNOSIS — R1084 Generalized abdominal pain: Secondary | ICD-10-CM | POA: Diagnosis not present

## 2023-02-23 DIAGNOSIS — D6869 Other thrombophilia: Secondary | ICD-10-CM | POA: Diagnosis not present

## 2023-02-23 DIAGNOSIS — K922 Gastrointestinal hemorrhage, unspecified: Secondary | ICD-10-CM | POA: Diagnosis not present

## 2023-02-23 LAB — CBC
HCT: 33 % — ABNORMAL LOW (ref 39.0–52.0)
Hemoglobin: 11.4 g/dL — ABNORMAL LOW (ref 13.0–17.0)
MCH: 33 pg (ref 26.0–34.0)
MCHC: 34.5 g/dL (ref 30.0–36.0)
MCV: 95.7 fL (ref 80.0–100.0)
Platelets: 299 10*3/uL (ref 150–400)
RBC: 3.45 MIL/uL — ABNORMAL LOW (ref 4.22–5.81)
RDW: 13.4 % (ref 11.5–15.5)
WBC: 6.3 10*3/uL (ref 4.0–10.5)
nRBC: 0 % (ref 0.0–0.2)

## 2023-02-23 LAB — BASIC METABOLIC PANEL
Anion gap: 11 (ref 5–15)
BUN: <5 mg/dL — ABNORMAL LOW (ref 6–20)
CO2: 25 mmol/L (ref 22–32)
Calcium: 8.6 mg/dL — ABNORMAL LOW (ref 8.9–10.3)
Chloride: 98 mmol/L (ref 98–111)
Creatinine, Ser: 0.71 mg/dL (ref 0.61–1.24)
GFR, Estimated: >60 mL/min (ref >60)
Glucose, Bld: 105 mg/dL — ABNORMAL HIGH (ref 70–99)
Potassium: 3.4 mmol/L — ABNORMAL LOW (ref 3.5–5.1)
Sodium: 134 mmol/L — ABNORMAL LOW (ref 135–145)

## 2023-02-23 LAB — TYPE AND SCREEN
ABO/RH(D): O POS
Antibody Screen: NEGATIVE

## 2023-02-23 LAB — PROTIME-INR
INR: 1.1 (ref 0.8–1.2)
Prothrombin Time: 14.5 s (ref 11.4–15.2)

## 2023-02-23 LAB — MAGNESIUM: Magnesium: 1.8 mg/dL (ref 1.7–2.4)

## 2023-02-23 LAB — HEPARIN LEVEL (UNFRACTIONATED): Heparin Unfractionated: <0.1 [IU]/mL — ABNORMAL LOW (ref 0.30–0.70)

## 2023-02-23 MED ORDER — HYDROMORPHONE HCL 1 MG/ML IJ SOLN
1.0000 mg | INTRAMUSCULAR | Status: DC | PRN
  Administered 2023-02-23 – 2023-02-26 (×23): 1 mg via INTRAVENOUS
  Filled 2023-02-23 (×23): qty 1

## 2023-02-23 MED ORDER — POTASSIUM CHLORIDE 10 MEQ/100ML IV SOLN
10.0000 meq | INTRAVENOUS | Status: AC
  Administered 2023-02-23 (×4): 10 meq via INTRAVENOUS
  Filled 2023-02-23 (×4): qty 100

## 2023-02-23 MED ORDER — IOHEXOL 300 MG/ML  SOLN
100.0000 mL | Freq: Once | INTRAMUSCULAR | Status: AC | PRN
  Administered 2023-02-23: 100 mL via INTRAVENOUS

## 2023-02-23 MED ORDER — MAGNESIUM SULFATE 2 GM/50ML IV SOLN
2.0000 g | Freq: Once | INTRAVENOUS | Status: AC
  Administered 2023-02-23: 2 g via INTRAVENOUS
  Filled 2023-02-23: qty 50

## 2023-02-23 MED ORDER — LACTATED RINGERS IV SOLN: INTRAVENOUS | Status: AC

## 2023-02-23 MED ORDER — CHLORHEXIDINE GLUCONATE CLOTH 2 % EX PADS
6.0000 | MEDICATED_PAD | Freq: Every day | CUTANEOUS | Status: DC
  Administered 2023-02-24 – 2023-02-26 (×4): 6 via TOPICAL

## 2023-02-23 MED ORDER — SODIUM CHLORIDE 0.9% FLUSH: 10.0000 mL | INTRAVENOUS | Status: DC | PRN

## 2023-02-23 MED ORDER — SODIUM CHLORIDE 0.9% FLUSH
10.0000 mL | Freq: Two times a day (BID) | INTRAVENOUS | Status: DC
  Administered 2023-02-23 – 2023-02-25 (×3): 10 mL
  Administered 2023-02-25: 20 mL
  Administered 2023-02-26: 10 mL

## 2023-02-23 NOTE — Consult Note (Addendum)
.  Pharmacy Consult Note - Anticoagulation  Pharmacy Consult for Heparin  Indication:  history of pulmonary embolism  PATIENT MEASUREMENTS: Height: 6' 2 (188 cm) Weight: 109.5 kg (241 lb 6.5 oz) IBW/kg (Calculated) : 82.2 HEPARIN  DW (KG): 95.8  VITAL SIGNS: Temp: 97.6 F (36.4 C) (01/02 0452) BP: 147/98 (01/02 0554) Pulse Rate: 100 (01/02 0554)  Recent Labs    02/23/23 0421  HGB 11.4*  HCT 33.0*  PLT 299  LABPROT 14.5  INR 1.1  HEPARINUNFRC <0.10*  CREATININE 0.71    Estimated Creatinine Clearance: 150.3 mL/min (by C-G formula based on SCr of 0.71 mg/dL).  PAST MEDICAL HISTORY: Past Medical History:  Diagnosis Date   Anxiety    Asthma    AS A CHILD ONLY   CHF (congestive heart failure) (HCC)    DVT (deep venous thrombosis) (HCC)    PE/DVT 2021   Erythrocytosis    Foot pain, right    painful x 1 month and swelling   HTN (hypertension)    Idiopathic gout     ASSESSMENT: 48 y.o. male with PMH including pulmonary embolism in 2023 is presenting with a bowel perforation, and is now s/p exploratory laparotomy. Patient is on chronic anticoagulation with Eliquis . Last dose was 02/13/2023 in the evening. Patient received Kcentra  on 12/24 due to need for emergent procedure. Pharmacy has been consulted to initiate and manage heparin  intravenous infusion.  Heparin  level undetectable after heparin  was turned off for BRB per NG noted overnight. Heparin  continues to be on hold, will continue to follow. Hgb remained stable at 11.   Goal(s) of therapy: Heparin  level 0.3-0.7 units/mL Monitor platelets by anticoagulation protocol: Yes   PLAN: Follow anticoagulation plan  Dempsey Blush PharmD., BCPS Clinical Pharmacist 02/23/2023 9:03 AM

## 2023-02-23 NOTE — Progress Notes (Signed)
 Dr.Segars in room speaking with patient.

## 2023-02-23 NOTE — Consult Note (Signed)
 Gastroenterology Consult   Referring Provider: No ref. provider found Primary Care Physician:  Cook, Jayce G, DO Primary Gastroenterologist:  previously unassigned, Dr. Cinderella  Patient ID: Ryan Gibson; [id number]; 12/27/1975   Admit date: 02/14/2023  LOS: 9 days   Date of Consultation: 02/23/2023  Reason for Consultation:  GI bleed    History of Present Illness   Ryan Gibson is a 48 y.o. 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 Work up revealed pneumoperitoneum and leukocytosis with unknown source but noted to have mildly dilated and inflamed small bowel loops in lower abdomen and pelvis and mild sigmoid wall thickening suggesting possible inflammation there as well.   Patient underwent emergent exploratory laparotomy after reversal of Eliquis  with Kcentra . He was noted to have purulent ascites, no source of perforation identified. Plans for EGD/colonoscopy as outpatient in 6-8 weeks. It is noted that patient has FH of colon polyps and has had some intermittent rectal bleeding in the more recent past. Course complicated by postoperative ileus requiring prolonged use of NGT one week postop. Of note, NG came out at least once and replaced 02/17/23.   Patient with abdominal pain, nausea/vomiting yesterday evening. NGT put back to suction with brown/red output. Patient noted to cough up/vomit small amt of blood with clot. Some bleeding noted from left nare. Heparin  drip placed on hold. Patient seen by evening hospitalist. KUB obtained showing worsening gaseous small bowel distention centrally, likely progressive postoperative ileus. Enteric tube tip and side port below diaphragm in the stomach. GI consulted for possible EGD.    Hgb on admission 18.5, nadir in the low 11 postoperatively with Hgb  11 yesterday and today 11.4. INR today 1.1. BUN <5.   GI consult: Patient complains of ongoing abdominal pain no significant change since his surgery.  He does feel like he has increased abdominal distention.  Complains of abdominal muscle cramps.  States he had significant diarrhea prior to presentation, feels like he was cleaned out well enough for a colonoscopy.  Prior to day of admission he denies any significant issues with his bowels, has occasional bright red blood on the toilet tissue which he was not concerned with.  No heartburn, dysphagia, unintentional weight loss.  No abdominal pain prior to presentation.  He reports his last bowel movement was small, about 2 days ago.  Does not feel like he has anything in his system to pass.  Has a small amount of serosanguineous fluid in his JP drain.  He has passed about 400 cc of black fluid from his NG tube since this morning shift.  1 L of black fluid at bedside, current NG tube with clear drainage.  Approximately 4 L of emesis through the NG tube documented for the last 2 days each for total of 8000 cc.  No prior EGD or colonoscopy.  He reports heavier alcohol use in his 20s/30s but only occasional since then.  Typically no significant ibuprofen or aspirin use.  However a couple of days prior to presentation he took 1600 mg ibuprofen 2 days in a row.  CT abdomen pelvis with contrast pending for today.   Prior to Admission medications   Medication Sig Start Date End Date Taking? Authorizing Provider  colchicine  0.6 MG tablet TAKE  1 TABLET BY MOUTH EVERY DAY 01/23/23  Yes Cheryl Waddell HERO, PA-C  DULoxetine  (CYMBALTA ) 60 MG capsule Take 1 capsule (60 mg total) by mouth daily. 11/08/22  Yes Cook, Jayce G, DO  ELIQUIS  5 MG TABS tablet Take 1 tablet (5 mg total) by mouth 2 (two) times daily. 11/23/22  Yes Conte, Tessa N, PA-C  empagliflozin  (JARDIANCE ) 10 MG TABS tablet Take 1 tablet (10  mg total) by mouth daily before breakfast. 11/23/22  Yes Conte, Tessa N, PA-C  Febuxostat  80 MG TABS TAKE 1 TABLET BY MOUTH EVERY DAY Patient taking differently: Take 80 mg by mouth daily. 01/23/23  Yes Cheryl Waddell HERO, PA-C  furosemide  (LASIX ) 40 MG tablet Take 80 mg by mouth daily.   Yes [provider]  metoprolol  succinate (TOPROL -XL) 100 MG 24 hr tablet Take 1.5 tablets (150 mg total) by mouth daily. Take with or immediately following a meal. 11/23/22 07/21/23 Yes Conte, Tessa N, PA-C  oxyCODONE -acetaminophen  (PERCOCET/ROXICET) 5-325 MG tablet Take 1 tablet by mouth every 8 (eight) hours as needed for severe pain (pain score 7-10). 02/07/23  Yes Cook, Jayce G, DO  predniSONE  (DELTASONE ) 5 MG tablet Take 4 tabs po qd x 14 days, 3  tabs po qd x 14 days, 2  tabs po qd x 14 days, 1  tab po qd x 14 days Patient taking differently: Take 10-20 mg by mouth See admin instructions. Take 4 tabs  daily x 14 days, 3  tabs daily x 14 days, 2  tabs daily x 14 days, 1  tab daily x 14 days 01/31/23  Yes Cheryl Waddell HERO, PA-C  sacubitril -valsartan  (ENTRESTO ) 49-51 MG Take 1 tablet by mouth 2 (two) times daily. 11/23/22  Yes Conte, Tessa N, PA-C  spironolactone  (ALDACTONE ) 25 MG tablet Take 1 tablet (25 mg total) by mouth daily. 11/23/22 11/18/23 Yes Lucien Orren SAILOR, PA-C    Current Facility-Administered Medications  Medication Dose Route Frequency Provider Last Rate Last Admin   bisacodyl  (DULCOLAX) suppository 10 mg  10 mg Rectal Daily PRN Mavis Anes, MD       Chlorhexidine  Gluconate Cloth 2 % PADS 6 each  6 each Topical Daily Kallie Manuelita BROCKS, MD   6 each at 02/22/23 0813   colchicine  tablet 0.6 mg  0.6 mg Oral Daily Johnson, Clanford L, MD   0.6 mg at 02/22/23 0805   DULoxetine  (CYMBALTA ) DR capsule 60 mg  60 mg Oral Daily Johnson, Clanford L, MD   60 mg at 02/22/23 0804   feeding supplement (BOOST / RESOURCE BREEZE) liquid 1 Container  1 Container Oral TID BM Mavis Anes, MD   1 Container at 02/22/23  0813   folic acid  (FOLVITE ) tablet 1 mg  1 mg Oral Daily Johnson, Clanford L, MD       hydrALAZINE  (APRESOLINE ) injection 10 mg  10 mg Intravenous Q4H PRN Johnson, Clanford L, MD   10 mg at 02/22/23 2201   HYDROmorphone  (DILAUDID ) injection 0.5 mg  0.5 mg Intravenous Q3H PRN Johnson, Clanford L, MD   0.5 mg at 02/23/23 9287   magnesium  sulfate IVPB 2 g 50 mL  2 g Intravenous Once Vicci, Clanford L, MD 50 mL/hr at 02/23/23 0851 2 g at 02/23/23 0851   methocarbamol  (ROBAXIN ) injection 500 mg  500 mg Intravenous Q6H PRN Kallie Manuelita BROCKS, MD   500 mg at 02/22/23 0759   metoCLOPramide  (REGLAN ) injection 10 mg  10 mg Intravenous Q6H Mavis Anes, MD   10 mg at 02/23/23  9442   metoprolol  tartrate (LOPRESSOR ) injection 2.5 mg  2.5 mg Intravenous Q6H Johnson, Clanford L, MD   2.5 mg at 02/23/23 0556   multivitamin with minerals tablet 1 tablet  1 tablet Oral Daily Vicci, Clanford L, MD   1 tablet at 02/22/23 0805   nicotine  (NICODERM CQ  - dosed in mg/24 hours) patch 14 mg  14 mg Transdermal q1600 Kallie Manuelita BROCKS, MD   14 mg at 02/19/23 1530   ondansetron  (ZOFRAN ) tablet 4 mg  4 mg Oral Q6H PRN Kallie Manuelita BROCKS, MD   4 mg at 02/22/23 9188   Or   ondansetron  (ZOFRAN ) injection 4 mg  4 mg Intravenous Q6H PRN Kallie Manuelita BROCKS, MD   4 mg at 02/23/23 0058   oxyCODONE  (Oxy IR/ROXICODONE ) immediate release tablet 5-10 mg  5-10 mg Oral Q4H PRN Kallie Manuelita BROCKS, MD   10 mg at 02/22/23 1627   pantoprazole  (PROTONIX ) injection 40 mg  40 mg Intravenous Q12H Kallie Manuelita BROCKS, MD   40 mg at 02/22/23 2150   phenol (CHLORASEPTIC) mouth spray 1 spray  1 spray Mouth/Throat PRN Kallie Manuelita BROCKS, MD   1 spray at 02/18/23 1803   polyethylene glycol (MIRALAX  / GLYCOLAX ) packet 17 g  17 g Oral Daily Johnson, Clanford L, MD   17 g at 02/22/23 1309   potassium chloride  10 mEq in 100 mL IVPB  10 mEq Intravenous Q1 Hr x 4 Johnson, Clanford L, MD 100 mL/hr at 02/23/23 0850 10 mEq at 02/23/23 0850   prochlorperazine   (COMPAZINE ) injection 10 mg  10 mg Intravenous Q4H PRN Kallie Manuelita BROCKS, MD   10 mg at 02/22/23 1811   simethicone  (MYLICON) chewable tablet 80 mg  80 mg Oral Q6H PRN Mavis Anes, MD   80 mg at 02/22/23 1627   spironolactone  (ALDACTONE ) tablet 25 mg  25 mg Oral Daily Johnson, Clanford L, MD   25 mg at 02/22/23 0805   thiamine  (VITAMIN B1) tablet 100 mg  100 mg Oral Daily Johnson, Clanford L, MD   100 mg at 02/22/23 9194   Or   thiamine  (VITAMIN B1) injection 100 mg  100 mg Intravenous Daily Johnson, Clanford L, MD   100 mg at 02/19/23 9070   traZODone  (DESYREL ) tablet 50 mg  50 mg Oral QHS PRN Evonnie Lenis, MD   50 mg at 02/20/23 2220    Allergies as of 02/14/2023 - Review Complete 02/14/2023  Allergen Reaction Noted   Vancomycin  Hives 07/13/2011    Past Medical History:  Diagnosis Date   Anxiety    Asthma    AS A CHILD ONLY   CHF (congestive heart failure) (HCC)    DVT (deep venous thrombosis) (HCC)    PE/DVT 2021   Erythrocytosis    Foot pain, right    painful x 1 month and swelling   HTN (hypertension)    Idiopathic gout     Past Surgical History:  Procedure Laterality Date   HERNIA REPAIR  07/13/2011   [facility]   INGUINAL HERNIA REPAIR  07/13/2011   Procedure: HERNIA REPAIR INGUINAL ADULT;  Surgeon: Donnice POUR. Belinda, MD;  Location: [facility];  Service: General;  Laterality: Right;   LAPAROTOMY N/A 02/14/2023   Procedure: EXPLORATORY LAPAROTOMY, wash out, drain placement;  Surgeon: Kallie Manuelita BROCKS, MD;  Location: [facility];  Service: General;  Laterality: N/A;    Family History  Problem Relation Age of Onset   Healthy Mother    Heart disease Father  Colon cancer Neg Hx     Social History   Socioeconomic History   Marital status: Married    Spouse name: Not on file   Number of children: 4   Years of education: Not on file   Highest education level: Associate degree: occupational, scientist, product/process development, or vocational program  Occupational History   Not on file  Tobacco Use    Smoking status: Never    Passive exposure: Never   Smokeless tobacco: Current    Types: Chew  Vaping Use   Vaping status: Never Used  Substance and Sexual Activity   Alcohol use: Yes    Comment: Social, heavier alcohol consumption in his 20s/30s   Drug use: No   Sexual activity: Yes    Birth control/protection: None    Comment: Married  Other Topics Concern   Not on file  Social History Narrative   Married for 13 years.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   Social Drivers of Health   Financial Resource Strain: Medium Risk (02/07/2023)   Overall Financial Resource Strain (CARDIA)    Difficulty of Paying Living Expenses: Somewhat hard  Food Insecurity: No Food Insecurity (02/15/2023)   Hunger Vital Sign    Worried About Running Out of Food in the Last Year: Never true    Ran Out of Food in the Last Year: Never true  Transportation Needs: No Transportation Needs (02/15/2023)   PRAPARE - Administrator, Civil Service (Medical): No    Lack of Transportation (Non-Medical): No  Physical Activity: Insufficiently Active (02/07/2023)   Exercise Vital Sign    Days of Exercise per Week: 3 days    Minutes of Exercise per Session: 30 min  Stress: No Stress Concern Present (02/07/2023)   [employer] of Occupational Health - Occupational Stress Questionnaire    Feeling of Stress : Only a little  Social Connections: Socially Integrated (02/07/2023)   Social Connection and Isolation Panel [NHANES]    Frequency of Communication with Friends and Family: More than three times a week    Frequency of Social Gatherings with Friends and Family: Once a week    Attends Religious Services: More than 4 times per year    Active Member of [employer] or Organizations: Yes    Attends Engineer, Structural: More than 4 times per year    Marital Status: Married  Catering Manager Violence: Not At Risk (02/15/2023)   Humiliation, Afraid, Rape, and Kick  questionnaire    Fear of Current or Ex-Partner: No    Emotionally Abused: No    Physically Abused: No    Sexually Abused: No     Review of System:   General: Negative for anorexia, weight loss, fever, chills, fatigue, weakness. Eyes: Negative for vision changes.  ENT: Negative for hoarseness, difficulty swallowing , nasal congestion. CV: Negative for chest pain, angina, palpitations, dyspnea on exertion, peripheral edema.  Respiratory: Negative for dyspnea at rest, dyspnea on exertion, cough, sputum, wheezing.  GI: See history of present illness. GU:  Negative for dysuria, hematuria, urinary incontinence, urinary frequency, nocturnal urination.  MS: Negative for joint pain, low back pain.  Derm: Negative for rash or itching.  Neuro: Negative for weakness, abnormal sensation, seizure, frequent headaches, memory loss, confusion.  Psych: Negative for anxiety, depression, suicidal ideation, hallucinations.  Endo: Negative for unusual weight change.  Heme: Negative for bruising or bleeding. Allergy: Negative for rash or hives.      Physical Examination:   Vital signs in  last 24 hours: Temp:  [97.6 F (36.4 C)-97.7 F (36.5 C)] 97.6 F (36.4 C) (01/02 0452) Pulse Rate:  [81-101] 100 (01/02 0554) Resp:  [17-18] 18 (01/02 0452) BP: (142-162)/(94-106) 147/98 (01/02 0554) SpO2:  [96 %-100 %] 96 % (01/02 0452) Weight:  [109.5 kg] 109.5 kg (01/02 0300) Last BM Date : 02/22/23  General: Well-nourished, well-developed in no acute distress.  Head: Normocephalic, atraumatic.   Eyes: Conjunctiva pink, no icterus. Mouth: Oropharyngeal mucosa moist and pink. NGT present with clear fluid in tube/canister at present Neck: Supple without thyromegaly, masses, or lymphadenopathy.  Lungs: Clear to auscultation bilaterally.  Heart: Regular rate and rhythm, no murmurs rubs or gallops.  Abdomen: Bowel sounds are normal, slightly distended abdomen with central tenderness with palpation. JP with scant  serosanguinous fluid present.    Rectal: not performed Extremities: No lower extremity edema, clubbing, deformity.  Neuro: Alert and oriented x 4 , grossly normal neurologically.  Skin: Warm and dry, no rash or jaundice.   Psych: Alert and cooperative, normal mood and affect.        Intake/Output from previous day: 01/01 0701 - 01/02 0700 In: 430 [P.O.:120; IV Piggyback:310] Out: 4487 [Urine:500; Emesis/NG output:3950; Drains:37] Intake/Output this shift: No intake/output data recorded.  Lab Results:   CBC Recent Labs    02/21/23 0439 02/22/23 0433 02/23/23 0421  WBC 7.3 7.1 6.3  HGB 11.1* 11.0* 11.4*  HCT 33.0* 32.6* 33.0*  MCV 97.6 96.7 95.7  PLT 274 285 299   BMET Recent Labs    02/21/23 0439 02/22/23 0433 02/23/23 0421  NA 135 134* 134*  K 3.3* 3.3* 3.4*  CL 102 99 98  CO2 21* 27 25  GLUCOSE 77 102* 105*  BUN <5* <5* <5*  CREATININE 0.71 0.65 0.71  CALCIUM  8.3* 8.5* 8.6*   LFT No results for input(s): BILITOT, BILIDIR, IBILI, ALKPHOS, AST, ALT, PROT, ALBUMIN in the last 72 hours.  Lab Results  Component Value Date   ALT 29 02/19/2023   AST 30 02/19/2023   ALKPHOS 37 (L) 02/19/2023   BILITOT 1.5 (H) 02/19/2023    Lipase No results for input(s): LIPASE in the last 72 hours.  Lab Results  Component Value Date   LIPASE 42 02/14/2023    PT/INR Recent Labs    02/23/23 0421  LABPROT 14.5  INR 1.1     Hepatitis Panel No results for input(s): HEPBSAG, HCVAB, HEPAIGM, HEPBIGM in the last 72 hours.   Imaging Studies:   DG Abd 1 View Result Date: 02/23/2023 CLINICAL DATA:  Ileus. EXAM: ABDOMEN - 1 VIEW COMPARISON:  Radiograph 02/20/2023 FINDINGS: Worsening gaseous small bowel distension centrally up to 5.5 cm. Enteric tube tip and side-port below the diaphragm in the stomach. A drain is seen in the upper abdomen. Midline skin staples. No significant formed colonic stool. Left pelvic phlebolith. IMPRESSION: 1. Worsening  gaseous small bowel distension centrally, likely progressive postoperative ileus. 2. Enteric tube tip and side-port below the diaphragm in the stomach. Electronically Signed   By: Andrea Gasman M.D.   On: 02/23/2023 03:32   DG Abd 1 View Result Date: 02/20/2023 CLINICAL DATA:  Postoperative ileus and abdominal pain. EXAM: ABDOMEN - 1 VIEW COMPARISON:  February 19, 2023. FINDINGS: Surgical drains are noted in the epigastric and pelvic regions. Mildly dilated small bowel loops are noted suggesting postoperative ileus. No colonic dilatation is noted. IMPRESSION: Stable small bowel dilatation concerning for postoperative ileus. Electronically Signed   By: Lynwood Landy Mickey CHRISTELLA.D.  On: 02/20/2023 13:13   DG Abd 1 View Result Date: 02/19/2023 CLINICAL DATA:  Abdominal pain and distention. Postop from exploratory laparotomy. EXAM: ABDOMEN - 1 VIEW COMPARISON:  None Available. FINDINGS: Surgical drain seen in the upper abdomen and pelvis, as well as midline skin staples. Mild diffuse dilatation of small bowel loops is seen as well as mild gaseous distention of the transverse colon. This likely represents a postop ileus, with partial small bowel obstruction considered less likely. IMPRESSION: Probable postop ileus, with partial small bowel obstruction considered less likely. Electronically Signed   By: Norleen DELENA Kil M.D.   On: 02/19/2023 13:04   DG Chest Port 1 View Result Date: 02/17/2023 CLINICAL DATA:  Nasogastric tube positioning. Recent exploratory laparotomy. EXAM: PORTABLE CHEST 1 VIEW COMPARISON:  02/14/2026 FINDINGS: Stable heart size. Nasogastric tube continues to extend into the stomach which shows some persistent gaseous distension. Correlation suggested with effectiveness of wall suction and patency of the tube itself. Additional surgical Penrose drain again visible in the upper abdomen. Stable elevation of the right hemidiaphragm. Slight increase in bibasilar atelectasis. No overt edema. No  pneumothorax or significant pleural fluid. IMPRESSION: 1. Nasogastric tube continues to extend into the stomach which shows some persistent gaseous distension. Correlation suggested with effectiveness of wall suction and patency of the tube itself. 2. Slight increase in bibasilar atelectasis. Electronically Signed   By: Marcey Moan M.D.   On: 02/17/2023 16:43   DG CHEST PORT 1 VIEW Result Date: 02/15/2023 CLINICAL DATA:  Enteric tube placement EXAM: PORTABLE CHEST 1 VIEW COMPARISON:  Chest radiograph dated 02/14/2023 FINDINGS: Gastric/enteric tube tip projects over the stomach. Drainage catheter is seen projecting over the left upper quadrant. Low lung volumes with bronchovascular crowding. Bibasilar patchy opacities. Enlarged cardiomediastinal silhouette is likely projectional. No definite pneumothorax or pleural effusion. IMPRESSION: 1. Gastric/enteric tube tip projects over the stomach. 2. Low lung volumes with bronchovascular crowding. Bibasilar patchy opacities, likely atelectasis. Electronically Signed   By: Limin  Xu M.D.   On: 02/15/2023 20:06   DG Chest Port 1 View Result Date: 02/14/2023 CLINICAL DATA:  NG tube placement EXAM: PORTABLE CHEST 1 VIEW COMPARISON:  02/14/2023 FINDINGS: NG tube tip is in the fundus of the stomach. Low lung volumes. Right base atelectasis. Left lung clear. Heart is normal size. No effusions. IMPRESSION: NG tube tip in the stomach. Low lung volumes.  Right base atelectasis. Electronically Signed   By: Franky Crease M.D.   On: 02/14/2023 19:17   DG Chest Port 1 View Result Date: 02/14/2023 CLINICAL DATA:  Shortness of breath. EXAM: PORTABLE CHEST 1 VIEW COMPARISON:  02/16/2020. FINDINGS: Low lung volume. Mild pulmonary vascular congestion, which may be accentuated by low lung volume. No frank pulmonary edema. Bilateral lung fields are otherwise clear. No acute consolidation or lung collapse. Bilateral costophrenic angles are clear. Normal cardio-mediastinal  silhouette. No acute osseous abnormalities. The soft tissues are within normal limits. IMPRESSION: *Mild diffuse pulmonary vascular congestion. Otherwise, no acute cardiopulmonary abnormality. Electronically Signed   By: Ree Molt M.D.   On: 02/14/2023 13:26   CT ABDOMEN PELVIS W CONTRAST Result Date: 02/14/2023 CLINICAL DATA:  Acute generalized abdominal pain. EXAM: CT ABDOMEN AND PELVIS WITH CONTRAST TECHNIQUE: Multidetector CT imaging of the abdomen and pelvis was performed using the standard protocol following bolus administration of intravenous contrast. RADIATION DOSE REDUCTION: This exam was performed according to the departmental dose-optimization program which includes automated exposure control, adjustment of the mA and/or kV according to patient size and/or use  of iterative reconstruction technique. CONTRAST:  OMNIPAQUE  IOHEXOL  300 MG/ML  SOLN COMPARISON:  February 11, 2020. FINDINGS: Lower chest: No acute abnormality. Hepatobiliary: No focal liver abnormality is seen. No gallstones, gallbladder wall thickening, or biliary dilatation. Pancreas: Unremarkable. No pancreatic ductal dilatation or surrounding inflammatory changes. Spleen: Normal in size without focal abnormality. Adrenals/Urinary Tract: Adrenal glands appear normal. Stable left renal cyst is noted for which no further follow-up is required. No hydronephrosis or renal obstruction is noted. Urinary bladder is unremarkable. Stomach/Bowel: The stomach is unremarkable. The appendix appears normal. Mild amount of free air is noted in the epigastric region and in the pelvis consistent with rupture of hollow viscus. Mildly dilated and thick walled small bowel loops are noted in the pelvis and lower abdomen suggesting enteritis. There also appears to be mild sigmoid wall thickening suggesting possible diverticulitis. There is noted air between a small bowel loop and sigmoid colon, and therefore the source of perforation is not clearly  identifiable. Vascular/Lymphatic: No significant vascular findings are present. No enlarged abdominal or pelvic lymph nodes. Reproductive: Prostate is unremarkable. Other: No ascites or hernia is noted. Musculoskeletal: No acute or significant osseous findings. IMPRESSION: Pneumoperitoneum is noted both in the epigastric and pelvic regions concerning for rupture of hollow viscus. Mildly dilated and inflamed small bowel loops are noted in lower abdomen and pelvis as well as mild sigmoid wall thickening suggesting possible inflammation there is well. It is uncertain if the pneumoperitoneum is secondary to ruptured small or large bowel. Critical Value/emergent results were called by telephone at the time of interpretation on 02/14/2023 at 1:22 pm to provider JOSEPH ZAMMIT , who verbally acknowledged these results. Electronically Signed   By: Lynwood Landy Raddle M.D.   On: 02/14/2023 13:24   ECHOCARDIOGRAM COMPLETE Result Date: 01/26/2023    ECHOCARDIOGRAM REPORT   Patient Name:   Ryan Gibson Date of Exam: 01/26/2023 Medical Rec #:  [id number]           Height:       74.0 in Accession #:    [id number]          Weight:       247.0 lb Date of Birth:  1975/08/04          BSA:          2.377 m Patient Age:    47 years            BP:           122/99 mmHg Patient Gender: M                   HR:           72 bpm. Exam Location:  [facility] Procedure: 2D Echo, 3D Echo, Cardiac Doppler, Color Doppler and Strain Analysis Indications:    I50.40* Unspecified combined systolic (congestive) and diastolic                 (congestive) heart failure  History:        Patient has prior history of Echocardiogram examinations, most                 recent 02/16/2020. Cardiomyopathy and CHF,                 Signs/Symptoms:Shortness of Breath, Dyspnea and Fatigue; Risk                 Factors:Hypertension and Dyslipidemia. Pulmonary embolus.  Sonographer:    Ellouise  [facility] RDCS Referring Phys: 22 TESSA N CONTE  Sonographer Comments: Image  acquisition challenging due to patient body habitus and Image acquisition challenging due to respiratory motion. IMPRESSIONS  1. Left ventricular ejection fraction, by estimation, is 50 to 55%. The left ventricle has low normal function. The left ventricle has no regional wall motion abnormalities. The left ventricular internal cavity size was mildly dilated. There is mild concentric left ventricular hypertrophy. Left ventricular diastolic parameters are consistent with Grade I diastolic dysfunction (impaired relaxation). The average left ventricular global longitudinal strain is -19.3 %. The global longitudinal strain is normal.  2. Right ventricular systolic function is normal. The right ventricular size is normal. Tricuspid regurgitation signal is inadequate for assessing PA pressure.  3. The mitral valve is grossly normal. Mild mitral valve regurgitation.  4. The aortic valve is tricuspid. Aortic valve regurgitation is not visualized. No aortic stenosis is present. Aortic valve mean gradient measures 3.0 mmHg.  5. Aortic dilatation noted. There is moderate dilatation of the aortic root, measuring 44 mm.  6. The inferior vena cava is normal in size with greater than 50% respiratory variability, suggesting right atrial pressure of 3 mmHg. Comparison(s): Prior images reviewed side by side. LVEF improved significantly in comparison, now 50-55% range. Mitral regurgitation is mild. Moderately dilated aortic root. FINDINGS  Left Ventricle: Left ventricular ejection fraction, by estimation, is 50 to 55%. The left ventricle has low normal function. The left ventricle has no regional wall motion abnormalities. The average left ventricular global longitudinal strain is -19.3 %. The global longitudinal strain is normal. The left ventricular internal cavity size was mildly dilated. There is mild concentric left ventricular hypertrophy. Left ventricular diastolic parameters are consistent with Grade I diastolic dysfunction  (impaired relaxation). Right Ventricle: The right ventricular size is normal. No increase in right ventricular wall thickness. Right ventricular systolic function is normal. Tricuspid regurgitation signal is inadequate for assessing PA pressure. Left Atrium: Left atrial size was normal in size. Right Atrium: Right atrial size was normal in size. Pericardium: There is no evidence of pericardial effusion. Presence of epicardial fat layer. Mitral Valve: The mitral valve is grossly normal. Mild mitral valve regurgitation. Tricuspid Valve: The tricuspid valve is grossly normal. Tricuspid valve regurgitation is trivial. Aortic Valve: The aortic valve is tricuspid. There is mild aortic valve annular calcification. Aortic valve regurgitation is not visualized. No aortic stenosis is present. Aortic valve mean gradient measures 3.0 mmHg. Aortic valve peak gradient measures 5.1 mmHg. Aortic valve area, by VTI measures 3.51 cm. Pulmonic Valve: The pulmonic valve was grossly normal. Pulmonic valve regurgitation is trivial. Aorta: Aortic dilatation noted. There is moderate dilatation of the aortic root, measuring 44 mm. Venous: The inferior vena cava is normal in size with greater than 50% respiratory variability, suggesting right atrial pressure of 3 mmHg. IAS/Shunts: No atrial level shunt detected by color flow Doppler.  LEFT VENTRICLE PLAX 2D LVIDd:         6.20 cm      Diastology LVIDs:         5.20 cm      LV e' medial:    4.46 cm/s LV PW:         1.10 cm      LV E/e' medial:  11.1 LV IVS:        0.90 cm      LV e' lateral:   8.70 cm/s LVOT diam:     2.40 cm      LV E/e' lateral:  5.7 LV SV:         72 LV SV Index:   30           2D Longitudinal Strain LVOT Area:     4.52 cm     2D Strain GLS Avg:     -19.3 %  LV Volumes (MOD) LV vol d, MOD A2C: 138.0 ml 3D Volume EF: LV vol d, MOD A4C: 113.0 ml 3D EF:        51 % LV vol s, MOD A2C: 62.5 ml  LV EDV:       214 ml LV vol s, MOD A4C: 55.6 ml  LV ESV:       105 ml LV SV MOD  A2C:     75.5 ml  LV SV:        109 ml LV SV MOD A4C:     113.0 ml LV SV MOD BP:      66.7 ml RIGHT VENTRICLE RV S prime:     10.90 cm/s TAPSE (M-mode): 2.1 cm LEFT ATRIUM             Index        RIGHT ATRIUM           Index LA diam:        2.30 cm 0.97 cm/m   RA Area:     11.90 cm LA Vol (A2C):   71.1 ml 29.91 ml/m  RA Volume:   26.30 ml  11.06 ml/m LA Vol (A4C):   37.9 ml 15.94 ml/m LA Biplane Vol: 54.7 ml 23.01 ml/m  AORTIC VALVE AV Area (Vmax):    3.35 cm AV Area (Vmean):   3.31 cm AV Area (VTI):     3.51 cm AV Vmax:           113.00 cm/s AV Vmean:          73.300 cm/s AV VTI:            0.206 m AV Peak Grad:      5.1 mmHg AV Mean Grad:      3.0 mmHg LVOT Vmax:         83.70 cm/s LVOT Vmean:        53.600 cm/s LVOT VTI:          0.160 m LVOT/AV VTI ratio: 0.78  AORTA Ao Root diam: 4.40 cm Ao Asc diam:  3.65 cm MITRAL VALVE MV Area (PHT): 3.21 cm    SHUNTS MV Decel Time: 236 msec    Systemic VTI:  0.16 m MV E velocity: 49.70 cm/s  Systemic Diam: 2.40 cm MV A velocity: 72.80 cm/s MV E/A ratio:  0.68 Jayson Sierras MD Electronically signed by Jayson Sierras MD Signature Date/Time: 01/26/2023/4:11:39 PM    Final   GALA.GANDER week]  Assessment:  48 y/o male with PMH of 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 Work up revealed pneumoperitoneum and leukocytosis with unknown source but noted to have mildly dilated and inflamed small bowel loops in lower abdomen and pelvis and mild sigmoid wall thickening suggesting possible inflammation there as well.  GI consult now due to upper GI bleeding.  Upper GI bleeding: Yesterday noted episode of vomiting.  NG tube placed back to suction with 1300 cc of brown/red  output.  Also noted bright red blood in the NG  tube later in the evening.  Patient coughed/vomited small amount of blood with a blood clot as well.  Heparin  drip placed on hold.  His hemoglobin is stable this morning.  Drainage in NG tube clear at present.  Query trauma from NG tube, Mallory-Weiss tear, gastritis/peptic ulcer disease remains in differential as well.  Pneumoperitoneum of unclear etiology/postoperative ileus: Closely followed by general surgery.  Medical course complicated by postoperative ileus.  CT abdomen pelvis with contrast planned for today.  Plan:   Continue IV pantoprazole  every 12 hours.  Trend H/H, monitor for ongoing overt GI bleeding.  Follow-up pending CT abdomen and pelvis with contrast planned for today. Consider upper endoscopy, timing to be determined based on clinical course and CT findings. At minimum he will need to have colonoscopy and upper endoscopy with plans for 6 to 8 weeks postoperatively but may need upper endoscopy sooner based on clinical course.   LOS: 9 days   We would like to thank you for the opportunity to participate in the care of Ryan Gibson.  Sonny RAMAN. Ezzard RIGGERS [facility] [phone] 1/2/20258:59 AM

## 2023-02-23 NOTE — Plan of Care (Signed)
  Problem: Clinical Measurements: Goal: Ability to maintain clinical measurements within normal limits will improve Outcome: Not Progressing   Problem: Nutrition: Goal: Adequate nutrition will be maintained Outcome: Not Progressing   Problem: Pain Management: Goal: General experience of comfort will improve Outcome: Not Progressing   Problem: Education: Goal: Knowledge of General Education information will improve Description: Including pain rating scale, medication(s)/side effects and non-pharmacologic comfort measures Outcome: Progressing   Problem: Health Behavior/Discharge Planning: Goal: Ability to manage health-related needs will improve Outcome: Progressing   Problem: Clinical Measurements: Goal: Will remain free from infection Outcome: Progressing Goal: Diagnostic test results will improve Outcome: Progressing Goal: Respiratory complications will improve Outcome: Progressing Goal: Cardiovascular complication will be avoided Outcome: Progressing   Problem: Activity: Goal: Risk for activity intolerance will decrease Outcome: Progressing   Problem: Coping: Goal: Level of anxiety will decrease Outcome: Progressing   Problem: Elimination: Goal: Will not experience complications related to bowel motility Outcome: Progressing Goal: Will not experience complications related to urinary retention Outcome: Progressing   Problem: Safety: Goal: Ability to remain free from injury will improve Outcome: Progressing   Problem: Skin Integrity: Goal: Risk for impaired skin integrity will decrease Outcome: Progressing   Problem: Safety: Goal: Non-violent Restraint(s) Outcome: Progressing

## 2023-02-23 NOTE — Progress Notes (Signed)
 Called into patient room by patient. Entered room, patient states he does think his NGT is working. Bright red blood noted via NGT. Earlier during the shift, brown stomach content was noted via NGT. Patient also coughed up/vomited small amount of blood with a blood clot noted. Patient is also bleeding a small amount from his left Nare. Heparin  drip placed on hold and provider noted of findings. Provider also asked to come and see patient. B/p 162/99.

## 2023-02-23 NOTE — Significant Event (Signed)
 Was notified by RN of new onset of GI bleeding, evaluated ~145 AM. Previous with higher output NG which was dark. Recently changing to bright red blood. Patient with worsened epigastric abd pain since going down on Dilaudid  dosing (prior to this no abrupt worsened pain). Hemodynamically stable, on exam abd mild distension, with mod / severe tenderness, mild rebound. No guarding or rigidity. JP with scant bloody drainage. Already on IV PPI. Messaged Dr eartha - for GI consult, possible EGD. Ordered T+S and repeat Hb in AM. Continue NGT to low intermittent suction. Check KUB. Paused heparin  gtt given new onset upper GI bleeding.  Dorn Dawson, MD  [facility]

## 2023-02-23 NOTE — Progress Notes (Signed)
 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  He presented from home by EMS complaining of severe generalized abdominal pain with nausea vomiting and diarrhea that started early this morning.  The pain is 10/10.  He feels very ill and having palpitations.  He denies fever and chills.  In the ED he was noted to have a rigid abdomen and sent for CT abdomen pelvis with findings of concern for a perforated abdominal viscus.  Emergent surgical consultation requested.  He is started on IV antibiotics and IV fluids and admission requested for further management. The patient underwent exploratory laparotomy for his pneumoperitoneum on 02/14/2023.  A JP drain was placed over the stomach and into the pelvis.  General surgery continue to follow-up for postoperative care.  His hospitalization has been prolonged secondary to patient removal of his NG tube and postoperative ileus. NG tube was replaced on 02/17/2023 putting out 1500 cc.   Assessment and Plan:  Sepsis from intraabdominal infection/pneumoperitoneum - pt emergently taken to OR on 12/24 by Dr. Kallie for exploratory lap -sepsis physiology resolving and markedly improved  -completed Zosyn  -completed IV fluids -Lactic acid 2.9>> 1.4 -lactic acidosis resolved  -sepsis  physiology resolved   Perforated abdominal viscous with pneumoperitoneum/postoperative ileus -Dr. Kallie took him to OR emergently after Kcentra  treatment on 12/24  -continue supportive measures -NG came out x2 -NG tube replaced 02/17/2023 with 1500 cc output -NG put out at least 900 cc 1/1 and now back on continuous suction -may clamp NG short time to allow for ambulation hallway -12/29--tiny BM, small flatus;  KUB with gas in colon -12/30--tiny BM, passing flatus; KUB with gas in colon -12/31--started on clears, now on full liquids  -1/1 --tried de-escalating IV opioid medication but pt did not tolerate -1/1 -- pt developed some hematemesis, IV heparin  stopped   -1/2 -- CT abd/pelv with IV contrast: ileus, 2 small postop hematomas seen; Hg remains stable at 11.  Starting ambulation in halls every 4 hours while awake.  - defer TPN decision to surgery team   History of PE/ Acquired thrombophilia -Patient diagnosed with PE and right lower extremity DVT 01/2020 -Felt to be secondary to underlying COVID-19 infection - last dose of apixaban  was taken 12/23 at 6pm - K-centra was given 12/24   - IV heparin  started no bolus 12/25 per surgery, HELD on 1/2  - Monitor hemoglobin>>remains stable at 11    Chronic HFrEF -02/05/2023 echo EF 55%, no WMA, normal RVF, grade 1 DD -02/16/2020 echo EF < 20 % -Initially felt to be secondary to alcohol abuse -Entresto  on hold until patient able to tolerate p.o. -Holding Jardiance  secondary to acute infectious process -Metoprolol  succinate transition to IV Lopressor  until able to tolerate p.o. -Restart spironolactone  when able to tolerate p.o. -Appears clinically euvolemic   Leukemoid reaction - RESOLVED  - secondary to abdominal infection and  surgery - following blood cultures x 2 : no growth to date  - unfortunately IV ceftriaxone  had already been given prior to blood cultures - WBC improved to normal    Hypomagnesemia - IV replacement ordered  and repleted  - keep mag >2 if possible, additional 4 gm IV given 1/1    Depression/GAD / alcohol dependence - IV lorazepam  ordered per [facility] protocol  - supplemental vitamins per [facility]WA protocol  - No signs of alcohol withdrawal - cymbalta  has been restarted  - Continue folic acid  and thiamine    Essential hypertension -Patient was on Entresto  and metoprolol  succinate prior to admission -Continue IV Lopressor  for now   Normocytic anemia  -continue daily morning CBC  -Hgb stable   Hyperbilirubinemia - in the setting of alcohol dependence   Generalized abdominal pain  - severe pain being treated with IV medications - IV hydromorphone  ordered as needed    Rheumatoid arthritis  - he is relatively immunocompromised - his rheumatologist has him on prednisone    Tobacco user - nicotine  patch ordered if needed for cravings    Sinus tachycardia - resolved now - likely having beta blocker rebound tachycardia  - IV lopressor  while NPO with hold parameters - resume home oral metoprolol  XL when able to take p.o. better   Hypokalemia / Hypomagnesemia -Repleted additional IV Mg given 1/1 and 1/2 -Check magnesium  in AM    DVT prophylaxis: IV heparin  Code Status: Full  Family Communication: wife at bedside 1/2 updated  Disposition: home    Consultants:  Surgery  Procedures:  Exp lap, open lesser sac and greater sac, JP drain placement 02/14/23   Antimicrobials:    Subjective: Pt feels bloated in the abdomen; still having nausea; having flatus and small BM  Objective: Vitals:   02/23/23 0200 02/23/23 0300 02/23/23 0452 02/23/23 0554  BP: (!) 142/97  (!) 153/94 (!) 147/98  Pulse:   92 100  Resp:   18   Temp:   97.6 F (36.4 C)   TempSrc:      SpO2:   96%   Weight:  109.5 kg    Height:        Intake/Output Summary (Last 24 hours) at 02/23/2023 1241 Last data filed at 02/23/2023 0600 Gross per 24 hour  Intake 430.03 ml  Output 3187 ml  Net -2756.97 ml   Filed Weights    02/21/23 0346 02/22/23 0434 02/23/23 0300  Weight: 108.4 kg 108.5 kg 109.5 kg   Examination:  General exam: Appears calm and comfortable  Respiratory system: Clear to auscultation. Respiratory effort normal. Cardiovascular system: normal S1 & S2 heard. No JVD, murmurs, rubs, gallops or clicks. No pedal edema. Gastrointestinal system: Abdomen is mildly distended, soft and nontender. No organomegaly or masses felt. Hypoactive bowel sounds heard. Central nervous system: Alert and oriented. No focal neurological deficits. Extremities: Symmetric 5 x 5 power. Skin: No rashes, lesions or ulcers. Psychiatry: Judgement and insight appear normal. Mood & affect appropriate.   Data Reviewed: I have personally reviewed following labs and imaging studies  CBC: Recent Labs  Lab 02/17/23 0031 02/18/23 0406 02/18/23 1042 02/19/23 0348 02/20/23 0522 02/21/23 0439 02/22/23 0433 02/23/23 0421  WBC 9.0 7.9  --  6.2 7.0 7.3 7.1 6.3  NEUTROABS 6.9 5.8  --  4.0  --   --   --   --   HGB 11.4* 12.8*   < > 11.5* 11.5* 11.1* 11.0* 11.4*  HCT 33.5* 38.1*   < > 32.9* 33.3* 33.0*  32.6* 33.0*  MCV 98.0 97.7  --  96.5 96.2 97.6 96.7 95.7  PLT 157 208  --  205 248 274 285 299   < > = values in this interval not displayed.    Basic Metabolic Panel: Recent Labs  Lab 02/18/23 0406 02/19/23 0348 02/20/23 0522 02/21/23 0439 02/22/23 0433 02/23/23 0421  NA 135 136 135 135 134* 134*  K 3.3* 3.4* 3.2* 3.3* 3.3* 3.4*  CL 101 102 101 102 99 98  CO2 23 23 21* 21* 27 25  GLUCOSE 84 78 83 77 102* 105*  BUN 9 7 5* <5* <5* <5*  CREATININE 0.73 0.70 0.75 0.71 0.65 0.71  CALCIUM  8.3* 8.0* 8.3* 8.3* 8.5* 8.6*  MG 1.9 1.9 1.7 1.8 1.7 1.8  PHOS 2.8 2.6 3.1 2.9 3.9  --     CBG: No results for input(s): GLUCAP in the last 168 hours.  Recent Results (from the past 240 hours)  Resp panel by RT-PCR (RSV, Flu A&B, Covid) Anterior Nasal Swab     Status: None   Collection Time: 02/14/23  1:18 PM   Specimen:  Anterior Nasal Swab  Result Value Ref Range Status   SARS Coronavirus 2 by RT PCR NEGATIVE NEGATIVE Final    Comment: (NOTE) SARS-CoV-2 target nucleic acids are NOT DETECTED.  The SARS-CoV-2 RNA is generally detectable in upper respiratory specimens during the acute phase of infection. The lowest concentration of SARS-CoV-2 viral copies this assay can detect is 138 copies/mL. A negative result does not preclude SARS-Cov-2 infection and should not be used as the sole basis for treatment or other patient management decisions. A negative result may occur with  improper specimen collection/handling, submission of specimen other than nasopharyngeal swab, presence of viral mutation(s) within the areas targeted by this assay, and inadequate number of viral copies(<138 copies/mL). A negative result must be combined with clinical observations, patient history, and epidemiological information. The expected result is Negative.  Fact Sheet for Patients:  bloggercourse.com  Fact Sheet for Healthcare Providers:  seriousbroker.it  This test is no t yet approved or cleared by the [place]  FDA and  has been authorized for detection and/or diagnosis of SARS-CoV-2 by FDA under an Emergency Use Authorization (EUA). This EUA will remain  in effect (meaning this test can be used) for the duration of the COVID-19 declaration under Section 564(b)(1) of the Act, 21 U.S.C.section 360bbb-3(b)(1), unless the authorization is terminated  or revoked sooner.       Influenza A by PCR NEGATIVE NEGATIVE Final   Influenza B by PCR NEGATIVE NEGATIVE Final    Comment: (NOTE) The Xpert Xpress SARS-CoV-2/FLU/RSV plus assay is intended as an aid in the diagnosis of influenza from Nasopharyngeal swab specimens and should not be used as a sole basis for treatment. Nasal washings and aspirates are unacceptable for Xpert Xpress SARS-CoV-2/FLU/RSV testing.  Fact  Sheet for Patients: bloggercourse.com  Fact Sheet for Healthcare Providers: seriousbroker.it  This test is not yet approved or cleared by the [place]  FDA and has been authorized for detection and/or diagnosis of SARS-CoV-2 by FDA under an Emergency Use Authorization (EUA). This EUA will remain in effect (meaning this test can be used) for the duration of the COVID-19 declaration under Section 564(b)(1) of the Act, 21 U.S.C. section 360bbb-3(b)(1), unless the authorization is terminated or revoked.     Resp Syncytial Virus by PCR NEGATIVE NEGATIVE Final    Comment: (NOTE) Fact Sheet for Patients: bloggercourse.com  Fact Sheet for Healthcare Providers:  seriousbroker.it  This test is not yet approved or cleared by the [place]  FDA and has been authorized for detection and/or diagnosis of SARS-CoV-2 by FDA under an Emergency Use Authorization (EUA). This EUA will remain in effect (meaning this test can be used) for the duration of the COVID-19 declaration under Section 564(b)(1) of the Act, 21 U.S.C. section 360bbb-3(b)(1), unless the authorization is terminated or revoked.  Performed at [facility], [address] [place], [place] [address]   Culture, blood (Routine X 2) w Reflex to ID Panel     Status: None   Collection Time: 02/14/23  2:20 PM   Specimen: Right Antecubital; Blood  Result Value Ref Range Status   Specimen Description RIGHT ANTECUBITAL  Final   Special Requests   Final    BOTTLES DRAWN AEROBIC AND ANAEROBIC Blood Culture adequate volume   Culture   Final    NO GROWTH 5 DAYS Performed at [facility], [address] [place], [place] [address]    Report Status 02/19/2023 FINAL  Final  Culture, blood (Routine X 2) w Reflex to ID Panel     Status: None   Collection Time: 02/14/23  2:56 PM   Specimen: BLOOD  Result Value Ref Range Status   Specimen  Description BLOOD BLOOD RIGHT FOREARM  Final   Special Requests   Final    BOTTLES DRAWN AEROBIC AND ANAEROBIC Blood Culture adequate volume   Culture   Final    NO GROWTH 5 DAYS Performed at [facility], [address], [place], [place] [address]    Report Status 02/19/2023 FINAL  Final  Aerobic/Anaerobic Culture w Gram Stain (surgical/deep wound)     Status: None   Collection Time: 02/14/23  4:17 PM   Specimen: Path fluid; Body Fluid  Result Value Ref Range Status   Specimen Description WOUND  Final   Special Requests PURLANT ASCITES  Final   Gram Stain   Final    ABUNDANT WBC PRESENT, PREDOMINANTLY PMN RARE GRAM NEGATIVE RODS    Culture   Final    FEW ESCHERICHIA COLI FEW BACTEROIDES SPECIES BETA LACTAMASE POSITIVE Performed at [facility], [address] [place], [place] [address]    Report Status 02/17/2023 FINAL  Final   Organism ID, Bacteria ESCHERICHIA COLI  Final      Susceptibility   Escherichia coli - MIC*    AMPICILLIN 4 SENSITIVE Sensitive     CEFEPIME <=0.12 SENSITIVE Sensitive     CEFTAZIDIME <=1 SENSITIVE Sensitive     CEFTRIAXONE  <=0.25 SENSITIVE Sensitive     CIPROFLOXACIN <=0.25 SENSITIVE Sensitive     GENTAMICIN <=1 SENSITIVE Sensitive     IMIPENEM <=0.25 SENSITIVE Sensitive     TRIMETH/SULFA <=20 SENSITIVE Sensitive     AMPICILLIN/SULBACTAM <=2 SENSITIVE Sensitive     PIP/TAZO <=4 SENSITIVE Sensitive ug/mL    * FEW ESCHERICHIA COLI  MRSA Next Gen by PCR, Nasal     Status: None   Collection Time: 02/14/23  6:30 PM   Specimen: Nasal Mucosa; Nasal Swab  Result Value Ref Range Status   MRSA by PCR Next Gen NOT DETECTED NOT DETECTED Final    Comment: (NOTE) The GeneXpert MRSA Assay (FDA approved for NASAL specimens only), is one component of a comprehensive MRSA colonization surveillance program. It is not intended to diagnose MRSA infection nor to guide or monitor treatment for MRSA infections. Test performance is not FDA approved in patients  less than 19 years old. Performed at [facility], [address] [place], [place] [address]      Radiology Studies: CT ABDOMEN PELVIS W CONTRAST Result Date: 02/23/2023 CLINICAL DATA:  Postoperative abdominal pain. EXAM: CT ABDOMEN AND PELVIS WITH CONTRAST TECHNIQUE: Multidetector CT imaging of the abdomen and pelvis was performed using the standard protocol following bolus administration of intravenous contrast. RADIATION DOSE REDUCTION: This exam was performed according to the departmental dose-optimization program which includes automated exposure control, adjustment of the mA and/or kV according to patient size and/or use of iterative reconstruction technique. CONTRAST:  OMNIPAQUE  IOHEXOL  300 MG/ML  SOLN COMPARISON:  February 14, 2023. FINDINGS: Lower chest: No acute abnormality. Hepatobiliary: No focal liver abnormality is seen. No gallstones, gallbladder wall thickening, or biliary dilatation. Pancreas: Unremarkable. No pancreatic ductal dilatation or surrounding inflammatory changes. Spleen: Normal in size without focal abnormality. Adrenals/Urinary Tract: Adrenal glands appear normal. Left renal cyst is noted for which no further follow-up is required. No hydronephrosis or renal obstruction is noted. Urinary bladder is decompressed. Stomach/Bowel: Nasogastric tube tip is seen in proximal stomach. Mildly dilated proximal small bowel loops are noted most likely representing ileus. No colonic dilatation is noted. 7.5 x 4.2 cm postoperative hematoma is seen inferior to the cecum. Vascular/Lymphatic: No significant vascular findings are present. No enlarged abdominal or pelvic lymph nodes. Reproductive: Prostate is unremarkable. Other: Surgical drain is seen involving the left anterior abdominal wall with distal tip underneath the left hepatic lobe. 7.5 x 4.0 cm hematoma is noted in the left rectus muscle just inferior to insertion of surgical drain. Midline surgical staples are noted.  Musculoskeletal: No acute or significant osseous findings. IMPRESSION: Mildly dilated proximal small bowel loops are noted most likely representing postoperative ileus. 7.5 x 4.2 cm postoperative hematoma is seen inferior to the cecum. Surgical drain is seen entering left anterior abdominal wall with distal tip underneath the left hepatic lobe. 7.5 x 4.0 cm hematoma is noted in the left rectus muscle just inferior to insertion of surgical drain. Electronically Signed   By: Lynwood Landy Raddle M.D.   On: 02/23/2023 11:36   DG Abd 1 View Result Date: 02/23/2023 CLINICAL DATA:  Ileus. EXAM: ABDOMEN - 1 VIEW COMPARISON:  Radiograph 02/20/2023 FINDINGS: Worsening gaseous small bowel distension centrally up to 5.5 cm. Enteric tube tip and side-port below the diaphragm in the stomach. A drain is seen in the upper abdomen. Midline skin staples. No significant formed colonic stool. Left pelvic phlebolith. IMPRESSION: 1. Worsening gaseous small bowel distension centrally, likely progressive postoperative ileus. 2. Enteric tube tip and side-port below the diaphragm in the stomach. Electronically Signed   By: Andrea Gasman M.D.   On: 02/23/2023 03:32    Scheduled Meds:  colchicine   0.6 mg Oral Daily   DULoxetine   60 mg Oral Daily   feeding supplement  1 Container Oral TID BM   folic acid   1 mg Oral Daily   metoCLOPramide  (REGLAN ) injection  10 mg Intravenous Q6H   metoprolol  tartrate  2.5 mg Intravenous Q6H   multivitamin with minerals  1 tablet Oral Daily   nicotine   14 mg Transdermal q1600   pantoprazole  (PROTONIX ) IV  40 mg Intravenous Q12H   polyethylene glycol  17 g Oral Daily   spironolactone   25 mg Oral Daily   thiamine   100 mg Oral Daily   Or   thiamine   100 mg Intravenous Daily   Continuous Infusions:  potassium chloride  10 mEq (02/23/23 1233)    LOS: 9 days   Time spent:  55 mins  Malkia Nippert Vicci, MD How to contact the [facility] Attending or Consulting provider 7A - 7P or covering provider during  after hours 7P -7A, for this patient?  Check the care team in [facility] and look for a) attending/consulting [facility] provider listed and b) the [facility] team listed Log into [email or web address] to find provider on call.  Locate the [facility] provider you are looking for under [facility] and page to a number that you can be directly reached. If you still have difficulty reaching the provider, please page the [facility] (Director on Call) for the Hospitalists listed on amion for assistance.  02/23/2023, 12:41 PM

## 2023-02-23 NOTE — Progress Notes (Signed)
 Peripherally Inserted Central Catheter Placement  The IV Nurse has discussed with the patient and/or persons authorized to consent for the patient, the purpose of this procedure and the potential benefits and risks involved with this procedure.  The benefits include less needle sticks, lab draws from the catheter, and the patient may be discharged home with the catheter. Risks include, but not limited to, infection, bleeding, blood clot (thrombus formation), and puncture of an artery; nerve damage and irregular heartbeat and possibility to perform a PICC exchange if needed/ordered by physician.  Alternatives to this procedure were also discussed.  Bard Power PICC patient education guide, fact sheet on infection prevention and patient information card has been provided to patient /or left at bedside.  Had to save ecg confirmation picture from upload charge phone due to pt name did not cross over with picture onto media.   PICC Placement Documentation  PICC Double Lumen 02/23/23 Right Brachial 45 cm 2 cm (Active)  Indication for Insertion or Continuance of Line Administration of hyperosmolar/irritating solutions (i.e. TPN, Vancomycin , etc.) 02/23/23 1959  Exposed Catheter (cm) 2 cm 02/23/23 1959  Site Assessment Clean, Dry, Intact 02/23/23 1959  Lumen #1 Status Flushed;Saline locked;Blood return noted 02/23/23 1959  Lumen #2 Status Flushed;Saline locked;Blood return noted 02/23/23 1959  Dressing Type Transparent;Securing device 02/23/23 1959  Dressing Status Antimicrobial disc in place;Clean, Dry, Intact 02/23/23 1959  Line Care Connections checked and tightened 02/23/23 1959       Kennedi Lizardo CHRISTELLA Bohr 02/23/2023, 8:10 PM

## 2023-02-23 NOTE — Progress Notes (Signed)
 [facility]  Having some flatus but no BM. CT done this AM to ensure not missing anything. Ileus. Hematoma in the rectus on the left and at the cecum from the drain placement.   NG with continued bilious output.   BP (!) 148/96 (BP Location: Left Arm)   Pulse 94   Temp 98.1 F (36.7 C) (Oral)   Resp 18   Ht 6' 2 (1.88 m)   Wt 109.5 kg   SpO2 99%   BMI 30.99 kg/m  JP with ss output, less in bulb  Midline with staples, no erythema or drainage  Patient s/p Ex lap for pneumoperitoneum, unknown source, drain placement X 2. CT with ileus and hematoma. No abscesses or blockage or hernia noted.  PRN narcotics IS, OOB NPO, NG and ileus Awaiting bowel function Can have ice and sips for comfort  JP at epigastric site, can likely remove tomorrow  H&H stable, bloody output on and off from NG, holding heparin  for now  SCDs, ambulate TPN planned for tomorrow, may can start PPN today versus continue IVF   Will need Egd and colonoscopyas outpatient.   Updated wife. Discussed with patient and team. Reassuring findings on CT. Situation still frustrating for patient and family.  Manuelita Pander, MD [facility] [address] [place], [place] [address] [phone] (office)

## 2023-02-24 ENCOUNTER — Telehealth: Payer: Self-pay | Admitting: Gastroenterology

## 2023-02-24 DIAGNOSIS — R935 Abnormal findings on diagnostic imaging of other abdominal regions, including retroperitoneum: Secondary | ICD-10-CM | POA: Diagnosis not present

## 2023-02-24 DIAGNOSIS — Z79899 Other long term (current) drug therapy: Secondary | ICD-10-CM

## 2023-02-24 DIAGNOSIS — R109 Unspecified abdominal pain: Secondary | ICD-10-CM

## 2023-02-24 DIAGNOSIS — D6869 Other thrombophilia: Secondary | ICD-10-CM | POA: Diagnosis not present

## 2023-02-24 DIAGNOSIS — E860 Dehydration: Secondary | ICD-10-CM

## 2023-02-24 DIAGNOSIS — R198 Other specified symptoms and signs involving the digestive system and abdomen: Secondary | ICD-10-CM | POA: Diagnosis not present

## 2023-02-24 LAB — MAGNESIUM: Magnesium: 1.7 mg/dL (ref 1.7–2.4)

## 2023-02-24 LAB — COMPREHENSIVE METABOLIC PANEL
ALT: 22 U/L (ref 0–44)
AST: 17 U/L (ref 15–41)
Albumin: 2.8 g/dL — ABNORMAL LOW (ref 3.5–5.0)
Alkaline Phosphatase: 40 U/L (ref 38–126)
Anion gap: 11 (ref 5–15)
BUN: 5 mg/dL — ABNORMAL LOW (ref 6–20)
CO2: 25 mmol/L (ref 22–32)
Calcium: 8.5 mg/dL — ABNORMAL LOW (ref 8.9–10.3)
Chloride: 100 mmol/L (ref 98–111)
Creatinine, Ser: 0.81 mg/dL (ref 0.61–1.24)
GFR, Estimated: >60 mL/min (ref >60)
Glucose, Bld: 97 mg/dL (ref 70–99)
Potassium: 3.6 mmol/L (ref 3.5–5.1)
Sodium: 136 mmol/L (ref 135–145)
Total Bilirubin: 1.8 mg/dL — ABNORMAL HIGH (ref 0.0–1.2)
Total Protein: 6.9 g/dL (ref 6.5–8.1)

## 2023-02-24 LAB — CBC
HCT: 32.8 % — ABNORMAL LOW (ref 39.0–52.0)
Hemoglobin: 10.8 g/dL — ABNORMAL LOW (ref 13.0–17.0)
MCH: 32.5 pg (ref 26.0–34.0)
MCHC: 32.9 g/dL (ref 30.0–36.0)
MCV: 98.8 fL (ref 80.0–100.0)
Platelets: 324 10*3/uL (ref 150–400)
RBC: 3.32 MIL/uL — ABNORMAL LOW (ref 4.22–5.81)
RDW: 13.7 % (ref 11.5–15.5)
WBC: 10 10*3/uL (ref 4.0–10.5)
nRBC: 0 % (ref 0.0–0.2)

## 2023-02-24 LAB — PHOSPHORUS: Phosphorus: 3.8 mg/dL (ref 2.5–4.6)

## 2023-02-24 LAB — PREALBUMIN: Prealbumin: 13 mg/dL — ABNORMAL LOW (ref 18–38)

## 2023-02-24 MED ORDER — POTASSIUM CHLORIDE 10 MEQ/100ML IV SOLN
10.0000 meq | INTRAVENOUS | Status: AC
  Administered 2023-02-24 (×4): 10 meq via INTRAVENOUS
  Filled 2023-02-24 (×4): qty 100

## 2023-02-24 MED ORDER — POLYETHYLENE GLYCOL 3350 17 G PO PACK: 17.0000 g | PACK | Freq: Every day | ORAL | Status: DC | PRN

## 2023-02-24 MED ORDER — MAGNESIUM SULFATE 4 GM/100ML IV SOLN
4.0000 g | Freq: Once | INTRAVENOUS | Status: AC
  Administered 2023-02-24: 4 g via INTRAVENOUS
  Filled 2023-02-24: qty 100

## 2023-02-24 MED ORDER — MAGNESIUM SULFATE 50 % IJ SOLN: 5.0000 g | Freq: Once | INTRAVENOUS | Status: DC

## 2023-02-24 MED ORDER — TRAVASOL 10 % IV SOLN
INTRAVENOUS | Status: AC
  Filled 2023-02-24: qty 547.2

## 2023-02-24 NOTE — Progress Notes (Addendum)
 Initial Nutrition Assessment  DOCUMENTATION CODES:   Not applicable  INTERVENTION:   TPN orders per Pharmacy to meet 100% of estimated nutrition needs as able.  Monitor magnesium , potassium, and phosphorus, MD to replete as needed, as pt is at risk for refeeding syndrome given minimal intake x 10 days.   Continue Thiamine  100 mg daily.  D/C Boost Breeze while NPO.   NUTRITION DIAGNOSIS:   Inadequate oral intake related to altered GI function as evidenced by NPO status.  GOAL:   Patient will meet greater than or equal to 90% of their needs  MONITOR:   I & O's, Diet advancement  REASON FOR ASSESSMENT:   Consult New TPN/TNA  ASSESSMENT:   48 yo male admitted with abdominal pain r/t pneumoperitoneum, unknown source. S/P ex lap and drain placement x 2 on 12/24. PMH includes childhood asthma, PE/DVT 2021, gout, HTN, anxiety, CHF.  RD working remotely. Unable to speak with patient or complete physical exam at this time.   Patient was NPO x 7 days after surgery, then advanced to a full liquid diet on 12/31. NG tube was removed 12/25 and 12/26, replaced on 12/27. He developed nausea and brown/red NG tube output 1/1, so diet was changed to NPO 1/2.   NG output: 5700 ml x 24 hours per flowsheet Drain output: 0 ml x 24 hours per flowsheet  PICC line was placed yesterday afternoon for initiation of TPN today for prolonged ileus. TPN to begin at 40 ml/h with a goal rate of 110 ml/h to provide 2508 kcal, 150 gm protein daily.  Labs reviewed. BUN 5  Medications reviewed and include folic acid , reglan , protonix , miralax , spironolactone , thiamine , IV mag sulfate, IV potassium chloride . Not receiving PO meds as patient is NPO.   Admission weight: 109.8 kg Current weight: 105.8 kg  Weight hx PTA reviewed. Patient has lost 5.5% of usual weight within the past month. Intake has been minimal for 10 days. Patient is at risk for refeeding syndrome and malnutrition.  NUTRITION - FOCUSED  PHYSICAL EXAM:  Unable to complete at this time  Diet Order:   Diet Order             Diet NPO time specified  Diet effective now                   EDUCATION NEEDS:   No education needs have been identified at this time  Skin:  Skin Assessment: Skin Integrity Issues: Skin Integrity Issues:: Incisions Incisions: abdomen  Last BM:  1/1  Height:   Ht Readings from Last 1 Encounters:  02/15/23 6' 2 (1.88 m)    Weight:   Wt Readings from Last 1 Encounters:  02/24/23 105.8 kg    Ideal Body Weight:  86.4 kg  BMI:  Body mass index is 29.95 kg/m.  Estimated Nutritional Needs:   Kcal:  7549-7349  Protein:  125-145 gm  Fluid:  2.5-2.7 L   Suzen HUNT RD, LDN, CNSC Contact Inpatient RD using group chat RD Inpatient via Secure Chat in EPIC

## 2023-02-24 NOTE — Progress Notes (Signed)
 [facility]  Having flatus but no Bm. Ng irritating the nares and thought. Patient pulled out. Ambulating for 20+ minutes at a time.   Picc in for TPN today.   BP (!) 151/90 (BP Location: Left Arm)   Pulse (!) 107   Temp 98.3 F (36.8 C)   Resp 16   Ht 6' 2 (1.88 m)   Wt 105.8 kg   SpO2 97%   BMI 29.95 kg/m  Soft, distended less, JP removed from epigastric region, SS in bulb  Labs reassuring.   patient s/p Ex lap for pneumoperitoneum, unknown source, drain placement X 2. CT with ileus and hematoma.  IS, OOB NPO can have sips with meds only  Awaiting bowel function Go slow with diet  Jps out  H&H drifting, but minimally  SCDs, ambulate TPN    Will need Egd and colonoscopyas outpatient.    Updated wife and patient.   Future Appointments  Date Time Provider Department Center  02/28/2023 11:15 AM Gibson Ryan BROCKS, MD RS-[facility] None  03/21/2023  8:40 AM Cook, Jayce G, DO [facility]  03/22/2023 11:00 AM Dayne Caroline S, RPH-CPP [facility] None  03/30/2023  1:30 PM Cheryl Waddell HERO, PA-C [facility] None  04/18/2023 10:30 AM Lucien Orren SAILOR, PA-C [facility]   Ryan Gibson seeing this weekend.   Ryan Kallie, MD [facility] [address] [address], [place] [address] [phone] (office)

## 2023-02-24 NOTE — Progress Notes (Signed)
 PROGRESS NOTE   Ryan Gibson  FMW:[id number] DOB: 1975-08-17 DOA: 02/14/2023 PCP: Cook, Jayce G, DO   Chief Complaint  Patient presents with   Abdominal Pain   Emesis   Diarrhea   Level of care: Med-Surg  Brief Admission History:  48 year old male with history of PE on apixaban  (last dose taken 12/23 at 6pm), seronegative rheumatoid arthritis, hyperlipidemia, dilated cardiomyopathy on GDMT, chronic systolic heart failure with EF improved to 50-55% on most recent echo done 01/26/23, gout, hypogonadism on testosterone , erythrocytosis thought from testosterone , essential hypertension, generalized anxiety disorder reportedly had just been referred to have a screening colonoscopy when he saw his PCP earlier this month.  He presented from home by EMS complaining of severe generalized abdominal pain with nausea vomiting and diarrhea that started early this morning.  The pain is 10/10.  He feels very ill and having palpitations.  He denies fever and chills.  In the ED he was noted to have a rigid abdomen and sent for CT abdomen pelvis with findings of concern for a perforated abdominal viscus.  Emergent surgical consultation requested.  He is started on IV antibiotics and IV fluids and admission requested for further management. The patient underwent exploratory laparotomy for his pneumoperitoneum on 02/14/2023.  A JP drain was placed over the stomach and into the pelvis.  General surgery continue to follow-up for postoperative care.  His hospitalization has been prolonged secondary to patient removal of his NG tube and postoperative ileus. NG tube was replaced on 02/17/2023 putting out 1500 cc.   Assessment and Plan:  Sepsis from intraabdominal infection/pneumoperitoneum - pt emergently taken to OR on 12/24 by Dr. Kallie for exploratory lap -sepsis physiology resolving and markedly improved  -completed Zosyn  -completed IV fluids -Lactic acid 2.9>> 1.4 -lactic acidosis resolved  -sepsis  physiology resolved   Perforated abdominal viscous with pneumoperitoneum/postoperative ileus -Dr. Kallie took him to OR emergently after Kcentra  treatment on 12/24  -continue supportive measures -NG came out x2 -NG tube replaced 02/17/2023 with 1500 cc output -NG put out at least 900 cc 1/1 and now back on continuous suction -may clamp NG short time to allow for ambulation hallway -12/29--tiny BM, small flatus;  KUB with gas in colon -12/30--tiny BM, passing flatus; KUB with gas in colon -12/31--started on clears, now on full liquids  -1/1 --tried de-escalating IV opioid medication but pt did not tolerate -1/1 -- pt developed some hematemesis, IV heparin  stopped   -1/2 -- CT abd/pelv with IV contrast: ileus, 2 small postop hematomas seen; Hg remains stable at 11.  Starting ambulation in halls every 4 hours while awake.  - defer TPN decision to surgery team   History of PE/ Acquired thrombophilia -Patient diagnosed with PE and right lower extremity DVT 01/2020 -Felt to be secondary to underlying COVID-19 infection - last dose of apixaban  was taken 12/23 at 6pm - K-centra was given 12/24   - IV heparin  started no bolus 12/25 per surgery, HELD on 1/2  - Monitor hemoglobin>>remains stable at 11  - IV heparin  currently on hold with small hematomas in abdomen found and hematemesis   Chronic HFrEF -02/05/2023 echo EF 55%, no WMA, normal RVF, grade 1 DD -02/16/2020 echo EF < 20 % -Initially felt to be secondary to alcohol abuse -Entresto  on hold until patient able to tolerate p.o. -Holding Jardiance  secondary to acute infectious process -Metoprolol  succinate transition to IV Lopressor  until able to tolerate p.o. -Restart spironolactone  when able to tolerate p.o. -Appears clinically  euvolemic   Leukemoid reaction - RESOLVED  - secondary to abdominal infection and surgery - following blood cultures x 2 : no growth to date  - unfortunately IV ceftriaxone  had already been given prior to  blood cultures - WBC improved to normal    Hypomagnesemia - IV replacement ordered and repleted  - keep mag >2 if possible, additional 4 gm IV given 1/1    Depression/GAD / alcohol dependence - IV lorazepam  ordered per CIWA protocol  - supplemental vitamins per CIWA protocol  - No signs of alcohol withdrawal - cymbalta  has been restarted  - Continue folic acid  and thiamine    Essential hypertension -Patient was on Entresto  and metoprolol  succinate prior to admission -Continue IV Lopressor  for now   Normocytic anemia  -continue daily morning CBC  -Hgb stable   Hyperbilirubinemia - in the setting of alcohol dependence   Generalized abdominal pain  - severe pain being treated with IV medications - IV hydromorphone  ordered as needed    Rheumatoid arthritis  - he is relatively immunocompromised - his rheumatologist has him on prednisone    Tobacco user - nicotine  patch ordered if needed for cravings    Sinus tachycardia - resolved now - likely having beta blocker rebound tachycardia  - IV lopressor  while NPO with hold parameters - resume home oral metoprolol  XL when able to take p.o. better   Hypokalemia / Hypomagnesemia -Repleted additional IV Mg given 1/1 and 1/2, 1/3 -Check magnesium  in AM    DVT prophylaxis: IV heparin  Code Status: Full  Family Communication: wife at bedside 1/2 updated  Disposition: home    Consultants:  Surgery   Procedures:  Exp lap, open lesser sac and greater sac, JP drain placement 02/14/23   Antimicrobials:    Subjective: Pt pulled out NGT could not stand it any longer.  Pt has been ambulating a lot in the halls. Having a lot of flatus.   Objective: Vitals:   02/23/23 2120 02/24/23 0425 02/24/23 1005 02/24/23 1346  BP: (!) 154/101 (!) 142/88 (!) 151/90 (!) 139/91  Pulse: 87 (!) 106 (!) 107 92  Resp: 18 16  18   Temp: 97.8 F (36.6 C) 98.3 F (36.8 C)  99.8 F (37.7 C)  TempSrc: Oral   Oral  SpO2: 98% 95% 97% 95%  Weight:   105.8 kg    Height:        Intake/Output Summary (Last 24 hours) at 02/24/2023 1729 Last data filed at 02/24/2023 1703 Gross per 24 hour  Intake 1947.92 ml  Output 3850 ml  Net -1902.08 ml   Filed Weights   02/22/23 0434 02/23/23 0300 02/24/23 0425  Weight: 108.5 kg 109.5 kg 105.8 kg   Examination:  General exam: Appears calm and comfortable  Respiratory system: Clear to auscultation. Respiratory effort normal. Cardiovascular system: normal S1 & S2 heard. No JVD, murmurs, rubs, gallops or clicks. No pedal edema. Gastrointestinal system: Abdomen is distended, soft and mildly tender. No organomegaly or masses felt. Hypoactive bowel sounds heard. Central nervous system: Alert and oriented. No focal neurological deficits. Extremities: Symmetric 5 x 5 power. Skin: No rashes, lesions or ulcers. Psychiatry: Judgement and insight appear normal. Mood & affect appropriate.   Data Reviewed: I have personally reviewed following labs and imaging studies  CBC: Recent Labs  Lab 02/18/23 0406 02/18/23 1042 02/19/23 0348 02/20/23 0522 02/21/23 0439 02/22/23 0433 02/23/23 0421 02/24/23 0356  WBC 7.9  --  6.2 7.0 7.3 7.1 6.3 10.0  NEUTROABS 5.8  --  4.0  --   --   --   --   --  HGB 12.8*   < > 11.5* 11.5* 11.1* 11.0* 11.4* 10.8*  HCT 38.1*   < > 32.9* 33.3* 33.0* 32.6* 33.0* 32.8*  MCV 97.7  --  96.5 96.2 97.6 96.7 95.7 98.8  PLT 208  --  205 248 274 285 299 324   < > = values in this interval not displayed.    Basic Metabolic Panel: Recent Labs  Lab 02/19/23 0348 02/20/23 0522 02/21/23 0439 02/22/23 0433 02/23/23 0421 02/24/23 0356  NA 136 135 135 134* 134* 136  K 3.4* 3.2* 3.3* 3.3* 3.4* 3.6  CL 102 101 102 99 98 100  CO2 23 21* 21* 27 25 25   GLUCOSE 78 83 77 102* 105* 97  BUN 7 5* <5* <5* <5* 5*  CREATININE 0.70 0.75 0.71 0.65 0.71 0.81  CALCIUM  8.0* 8.3* 8.3* 8.5* 8.6* 8.5*  MG 1.9 1.7 1.8 1.7 1.8 1.7  PHOS 2.6 3.1 2.9 3.9  --  3.8    CBG: No results for input(s):  GLUCAP in the last 168 hours.  Recent Results (from the past 240 hours)  MRSA Next Gen by PCR, Nasal     Status: None   Collection Time: 02/14/23  6:30 PM   Specimen: Nasal Mucosa; Nasal Swab  Result Value Ref Range Status   MRSA by PCR Next Gen NOT DETECTED NOT DETECTED Final    Comment: (NOTE) The GeneXpert MRSA Assay (FDA approved for NASAL specimens only), is one component of a comprehensive MRSA colonization surveillance program. It is not intended to diagnose MRSA infection nor to guide or monitor treatment for MRSA infections. Test performance is not FDA approved in patients less than 4 years old. Performed at [facility], [address] [facility] [facility] [facility], [place] [address]      Radiology Studies: US  EKG SITE RITE Result Date: 02/23/2023 If Site Rite image not attached, placement could not be confirmed due to current cardiac rhythm.  CT ABDOMEN PELVIS W CONTRAST Result Date: 02/23/2023 CLINICAL DATA:  Postoperative abdominal pain. EXAM: CT ABDOMEN AND PELVIS WITH CONTRAST TECHNIQUE: Multidetector CT imaging of the abdomen and pelvis was performed using the standard protocol following bolus administration of intravenous contrast. RADIATION DOSE REDUCTION: This exam was performed according to the departmental dose-optimization program which includes automated exposure control, adjustment of the mA and/or kV according to patient size and/or use of iterative reconstruction technique. CONTRAST:  OMNIPAQUE  IOHEXOL  300 MG/ML  SOLN COMPARISON:  February 14, 2023. FINDINGS: Lower chest: No acute abnormality. Hepatobiliary: No focal liver abnormality is seen. No gallstones, gallbladder wall thickening, or biliary dilatation. Pancreas: Unremarkable. No pancreatic ductal dilatation or surrounding inflammatory changes. Spleen: Normal in size without focal abnormality. Adrenals/Urinary Tract: Adrenal glands appear normal. Left renal cyst is noted for which no further follow-up is required. No  hydronephrosis or renal obstruction is noted. Urinary bladder is decompressed. Stomach/Bowel: Nasogastric tube tip is seen in proximal stomach. Mildly dilated proximal small bowel loops are noted most likely representing ileus. No colonic dilatation is noted. 7.5 x 4.2 cm postoperative hematoma is seen inferior to the cecum. Vascular/Lymphatic: No significant vascular findings are present. No enlarged abdominal or pelvic lymph nodes. Reproductive: Prostate is unremarkable. Other: Surgical drain is seen involving the left anterior abdominal wall with distal tip underneath the left hepatic lobe. 7.5 x 4.0 cm hematoma is noted in the left rectus muscle just inferior to insertion of surgical drain. Midline surgical staples are noted. Musculoskeletal: No acute or significant osseous findings. IMPRESSION: Mildly dilated proximal small bowel  loops are noted most likely representing postoperative ileus. 7.5 x 4.2 cm postoperative hematoma is seen inferior to the cecum. Surgical drain is seen entering left anterior abdominal wall with distal tip underneath the left hepatic lobe. 7.5 x 4.0 cm hematoma is noted in the left rectus muscle just inferior to insertion of surgical drain. Electronically Signed   By: Lynwood Landy Raddle M.D.   On: 02/23/2023 11:36   DG Abd 1 View Result Date: 02/23/2023 CLINICAL DATA:  Ileus. EXAM: ABDOMEN - 1 VIEW COMPARISON:  Radiograph 02/20/2023 FINDINGS: Worsening gaseous small bowel distension centrally up to 5.5 cm. Enteric tube tip and side-port below the diaphragm in the stomach. A drain is seen in the upper abdomen. Midline skin staples. No significant formed colonic stool. Left pelvic phlebolith. IMPRESSION: 1. Worsening gaseous small bowel distension centrally, likely progressive postoperative ileus. 2. Enteric tube tip and side-port below the diaphragm in the stomach. Electronically Signed   By: Andrea Gasman M.D.   On: 02/23/2023 03:32    Scheduled Meds:  Chlorhexidine  Gluconate  Cloth  6 each Topical Daily   colchicine   0.6 mg Oral Daily   DULoxetine   60 mg Oral Daily   folic acid   1 mg Oral Daily   metoprolol  tartrate  2.5 mg Intravenous Q6H   nicotine   14 mg Transdermal q1600   pantoprazole  (PROTONIX ) IV  40 mg Intravenous Q12H   polyethylene glycol  17 g Oral Daily   sodium chloride  flush  10-40 mL Intracatheter Q12H   spironolactone   25 mg Oral Daily   thiamine   100 mg Oral Daily   Or   thiamine   100 mg Intravenous Daily   Continuous Infusions:  TPN ADULT (ION)      LOS: 10 days   Time spent: 57 mins  Deo Mehringer Vicci, MD How to contact the [facility] Attending or Consulting provider 7A - 7P or covering provider during after hours 7P -7A, for this patient?  Check the care team in [facility] and look for a) attending/consulting [facility] provider listed and b) the [facility] team listed Log into [email or web address] to find provider on call.  Locate the [facility] provider you are looking for under [facility] and page to a number that you can be directly reached. If you still have difficulty reaching the provider, please page the [facility] (Director on Call) for the Hospitalists listed on amion for assistance.  02/24/2023, 5:29 PM

## 2023-02-24 NOTE — Plan of Care (Signed)
  Problem: Education: Goal: Knowledge of General Education information will improve Description: Including pain rating scale, medication(s)/side effects and non-pharmacologic comfort measures Outcome: Progressing   Problem: Clinical Measurements: Goal: Ability to maintain clinical measurements within normal limits will improve Outcome: Progressing   Problem: Pain Management: Goal: General experience of comfort will improve Outcome: Progressing   Problem: Safety: Goal: Ability to remain free from injury will improve Outcome: Progressing   Problem: Skin Integrity: Goal: Risk for impaired skin integrity will decrease Outcome: Progressing

## 2023-02-24 NOTE — Plan of Care (Signed)

## 2023-02-24 NOTE — Plan of Care (Signed)
  Problem: Education: Goal: Knowledge of General Education information will improve Description: Including pain rating scale, medication(s)/side effects and non-pharmacologic comfort measures Outcome: Progressing   Problem: Clinical Measurements: Goal: Ability to maintain clinical measurements within normal limits will improve Outcome: Progressing   Problem: Nutrition: Goal: Adequate nutrition will be maintained Outcome: Progressing   Problem: Coping: Goal: Level of anxiety will decrease Outcome: Progressing   Problem: Pain Management: Goal: General experience of comfort will improve Outcome: Progressing   Problem: Safety: Goal: Ability to remain free from injury will improve Outcome: Progressing   Problem: Skin Integrity: Goal: Risk for impaired skin integrity will decrease Outcome: Progressing   Problem: Activity: Goal: Risk for activity intolerance will decrease Outcome: Progressing

## 2023-02-24 NOTE — Progress Notes (Signed)
 Gastroenterology Progress Note   Referring Provider: No ref. provider found Primary Care Physician:  Cook, Jayce G, DO Primary Gastroenterologist:  Muhammad Faizan Ahmed, MD  Patient ID: Ryan Gibson; [id number]; 1976-01-20    Subjective   Patient reports NG tube removed this morning given significant posterior oropharynx pain.  He remains nauseous but denies any vomiting this morning.  Had regular bilious output from NG tube overnight, observed about 250 cc in the suction canister.  Reports he is passing some gas from below but no bowel movement.  Continues to have abdominal pain but reports his abdominal distention is improved from yesterday.  Objective   Vital signs in last 24 hours Temp:  [97.8 F (36.6 C)-98.3 F (36.8 C)] 98.3 F (36.8 C) (01/03 0425) Pulse Rate:  [87-107] 107 (01/03 1005) Resp:  [16-18] 16 (01/03 0425) BP: (142-154)/(88-101) 151/90 (01/03 1005) SpO2:  [95 %-99 %] 97 % (01/03 1005) Weight:  [105.8 kg] 105.8 kg (01/03 0425) Last BM Date : 02/22/23  Physical Exam General:   Alert and oriented, pleasant Head:  Normocephalic and atraumatic. Eyes:  No icterus, sclera clear. Conjuctiva pink.  Mouth:  Without lesions, mucosa pink and moist.  Neck:  Supple, without thyromegaly or masses.  Abdomen: Abdomen soft.  Mild distention.  Hypoactive bowel sounds.  No HSM or hernias noted. No rebound or guarding. No masses appreciated.  Surgical wound with  staples C/D/I. JP drain in place with serosanguinous output.  Extremities:  Without clubbing or edema. Neurologic:  Alert and oriented x4;  grossly normal neurologically. Skin:  Warm and dry, intact without significant lesions.  Psych:  Alert and cooperative. Normal mood and affect.  Intake/Output from previous day: 01/02 0701 - 01/03 0700 In: 1183.9 [I.V.:783.9; IV Piggyback:400] Out: 6250 [Urine:550; Emesis/NG output:5700] Intake/Output this shift: No intake/output data recorded.  Lab  Results  Recent Labs    02/22/23 0433 02/23/23 0421 02/24/23 0356  WBC 7.1 6.3 10.0  HGB 11.0* 11.4* 10.8*  HCT 32.6* 33.0* 32.8*  PLT 285 299 324   BMET Recent Labs    02/22/23 0433 02/23/23 0421 02/24/23 0356  NA 134* 134* 136  K 3.3* 3.4* 3.6  CL 99 98 100  CO2 27 25 25   GLUCOSE 102* 105* 97  BUN <5* <5* 5*  CREATININE 0.65 0.71 0.81  CALCIUM  8.5* 8.6* 8.5*   LFT Recent Labs    02/24/23 0356  PROT 6.9  ALBUMIN 2.8*  AST 17  ALT 22  ALKPHOS 40  BILITOT 1.8*   PT/INR Recent Labs    02/23/23 0421  LABPROT 14.5  INR 1.1   Hepatitis Panel No results for input(s): HEPBSAG, HCVAB, HEPAIGM, HEPBIGM in the last 72 hours.   Studies/Results US  EKG SITE RITE Result Date: 02/23/2023 If Site Rite image not attached, placement could not be confirmed due to current cardiac rhythm.  CT ABDOMEN PELVIS W CONTRAST Result Date: 02/23/2023 CLINICAL DATA:  Postoperative abdominal pain. EXAM: CT ABDOMEN AND PELVIS WITH CONTRAST TECHNIQUE: Multidetector CT imaging of the abdomen and pelvis was performed using the standard protocol following bolus administration of intravenous contrast. RADIATION DOSE REDUCTION: This exam was performed according to the departmental dose-optimization program which includes automated exposure control, adjustment of the mA and/or kV according to patient size and/or use of iterative reconstruction technique. CONTRAST:  OMNIPAQUE  IOHEXOL  300 MG/ML  SOLN COMPARISON:  February 14, 2023. FINDINGS: Lower chest: No acute abnormality. Hepatobiliary: No focal liver abnormality is seen. No gallstones, gallbladder wall thickening,  or biliary dilatation. Pancreas: Unremarkable. No pancreatic ductal dilatation or surrounding inflammatory changes. Spleen: Normal in size without focal abnormality. Adrenals/Urinary Tract: Adrenal glands appear normal. Left renal cyst is noted for which no further follow-up is required. No hydronephrosis or renal obstruction  is noted. Urinary bladder is decompressed. Stomach/Bowel: Nasogastric tube tip is seen in proximal stomach. Mildly dilated proximal small bowel loops are noted most likely representing ileus. No colonic dilatation is noted. 7.5 x 4.2 cm postoperative hematoma is seen inferior to the cecum. Vascular/Lymphatic: No significant vascular findings are present. No enlarged abdominal or pelvic lymph nodes. Reproductive: Prostate is unremarkable. Other: Surgical drain is seen involving the left anterior abdominal wall with distal tip underneath the left hepatic lobe. 7.5 x 4.0 cm hematoma is noted in the left rectus muscle just inferior to insertion of surgical drain. Midline surgical staples are noted. Musculoskeletal: No acute or significant osseous findings. IMPRESSION: Mildly dilated proximal small bowel loops are noted most likely representing postoperative ileus. 7.5 x 4.2 cm postoperative hematoma is seen inferior to the cecum. Surgical drain is seen entering left anterior abdominal wall with distal tip underneath the left hepatic lobe. 7.5 x 4.0 cm hematoma is noted in the left rectus muscle just inferior to insertion of surgical drain. Electronically Signed   By: Lynwood Landy Raddle M.D.   On: 02/23/2023 11:36   DG Abd 1 View Result Date: 02/23/2023 CLINICAL DATA:  Ileus. EXAM: ABDOMEN - 1 VIEW COMPARISON:  Radiograph 02/20/2023 FINDINGS: Worsening gaseous small bowel distension centrally up to 5.5 cm. Enteric tube tip and side-port below the diaphragm in the stomach. A drain is seen in the upper abdomen. Midline skin staples. No significant formed colonic stool. Left pelvic phlebolith. IMPRESSION: 1. Worsening gaseous small bowel distension centrally, likely progressive postoperative ileus. 2. Enteric tube tip and side-port below the diaphragm in the stomach. Electronically Signed   By: Andrea Gasman M.D.   On: 02/23/2023 03:32   DG Abd 1 View Result Date: 02/20/2023 CLINICAL DATA:  Postoperative ileus and  abdominal pain. EXAM: ABDOMEN - 1 VIEW COMPARISON:  February 19, 2023. FINDINGS: Surgical drains are noted in the epigastric and pelvic regions. Mildly dilated small bowel loops are noted suggesting postoperative ileus. No colonic dilatation is noted. IMPRESSION: Stable small bowel dilatation concerning for postoperative ileus. Electronically Signed   By: Lynwood Landy Raddle M.D.   On: 02/20/2023 13:13   DG Abd 1 View Result Date: 02/19/2023 CLINICAL DATA:  Abdominal pain and distention. Postop from exploratory laparotomy. EXAM: ABDOMEN - 1 VIEW COMPARISON:  None Available. FINDINGS: Surgical drain seen in the upper abdomen and pelvis, as well as midline skin staples. Mild diffuse dilatation of small bowel loops is seen as well as mild gaseous distention of the transverse colon. This likely represents a postop ileus, with partial small bowel obstruction considered less likely. IMPRESSION: Probable postop ileus, with partial small bowel obstruction considered less likely. Electronically Signed   By: Norleen DELENA Kil M.D.   On: 02/19/2023 13:04   DG Chest Port 1 View Result Date: 02/17/2023 CLINICAL DATA:  Nasogastric tube positioning. Recent exploratory laparotomy. EXAM: PORTABLE CHEST 1 VIEW COMPARISON:  02/14/2026 FINDINGS: Stable heart size. Nasogastric tube continues to extend into the stomach which shows some persistent gaseous distension. Correlation suggested with effectiveness of wall suction and patency of the tube itself. Additional surgical Penrose drain again visible in the upper abdomen. Stable elevation of the right hemidiaphragm. Slight increase in bibasilar atelectasis. No overt edema. No pneumothorax  or significant pleural fluid. IMPRESSION: 1. Nasogastric tube continues to extend into the stomach which shows some persistent gaseous distension. Correlation suggested with effectiveness of wall suction and patency of the tube itself. 2. Slight increase in bibasilar atelectasis. Electronically Signed    By: Marcey Moan M.D.   On: 02/17/2023 16:43   DG CHEST PORT 1 VIEW Result Date: 02/15/2023 CLINICAL DATA:  Enteric tube placement EXAM: PORTABLE CHEST 1 VIEW COMPARISON:  Chest radiograph dated 02/14/2023 FINDINGS: Gastric/enteric tube tip projects over the stomach. Drainage catheter is seen projecting over the left upper quadrant. Low lung volumes with bronchovascular crowding. Bibasilar patchy opacities. Enlarged cardiomediastinal silhouette is likely projectional. No definite pneumothorax or pleural effusion. IMPRESSION: 1. Gastric/enteric tube tip projects over the stomach. 2. Low lung volumes with bronchovascular crowding. Bibasilar patchy opacities, likely atelectasis. Electronically Signed   By: Limin  Xu M.D.   On: 02/15/2023 20:06   DG Chest Port 1 View Result Date: 02/14/2023 CLINICAL DATA:  NG tube placement EXAM: PORTABLE CHEST 1 VIEW COMPARISON:  02/14/2023 FINDINGS: NG tube tip is in the fundus of the stomach. Low lung volumes. Right base atelectasis. Left lung clear. Heart is normal size. No effusions. IMPRESSION: NG tube tip in the stomach. Low lung volumes.  Right base atelectasis. Electronically Signed   By: Franky Crease M.D.   On: 02/14/2023 19:17   DG Chest Port 1 View Result Date: 02/14/2023 CLINICAL DATA:  Shortness of breath. EXAM: PORTABLE CHEST 1 VIEW COMPARISON:  02/16/2020. FINDINGS: Low lung volume. Mild pulmonary vascular congestion, which may be accentuated by low lung volume. No frank pulmonary edema. Bilateral lung fields are otherwise clear. No acute consolidation or lung collapse. Bilateral costophrenic angles are clear. Normal cardio-mediastinal silhouette. No acute osseous abnormalities. The soft tissues are within normal limits. IMPRESSION: *Mild diffuse pulmonary vascular congestion. Otherwise, no acute cardiopulmonary abnormality. Electronically Signed   By: Ree Molt M.D.   On: 02/14/2023 13:26   CT ABDOMEN PELVIS W CONTRAST Result Date:  02/14/2023 CLINICAL DATA:  Acute generalized abdominal pain. EXAM: CT ABDOMEN AND PELVIS WITH CONTRAST TECHNIQUE: Multidetector CT imaging of the abdomen and pelvis was performed using the standard protocol following bolus administration of intravenous contrast. RADIATION DOSE REDUCTION: This exam was performed according to the departmental dose-optimization program which includes automated exposure control, adjustment of the mA and/or kV according to patient size and/or use of iterative reconstruction technique. CONTRAST:  OMNIPAQUE  IOHEXOL  300 MG/ML  SOLN COMPARISON:  February 11, 2020. FINDINGS: Lower chest: No acute abnormality. Hepatobiliary: No focal liver abnormality is seen. No gallstones, gallbladder wall thickening, or biliary dilatation. Pancreas: Unremarkable. No pancreatic ductal dilatation or surrounding inflammatory changes. Spleen: Normal in size without focal abnormality. Adrenals/Urinary Tract: Adrenal glands appear normal. Stable left renal cyst is noted for which no further follow-up is required. No hydronephrosis or renal obstruction is noted. Urinary bladder is unremarkable. Stomach/Bowel: The stomach is unremarkable. The appendix appears normal. Mild amount of free air is noted in the epigastric region and in the pelvis consistent with rupture of hollow viscus. Mildly dilated and thick walled small bowel loops are noted in the pelvis and lower abdomen suggesting enteritis. There also appears to be mild sigmoid wall thickening suggesting possible diverticulitis. There is noted air between a small bowel loop and sigmoid colon, and therefore the source of perforation is not clearly identifiable. Vascular/Lymphatic: No significant vascular findings are present. No enlarged abdominal or pelvic lymph nodes. Reproductive: Prostate is unremarkable. Other: No ascites or hernia  is noted. Musculoskeletal: No acute or significant osseous findings. IMPRESSION: Pneumoperitoneum is noted both in the  epigastric and pelvic regions concerning for rupture of hollow viscus. Mildly dilated and inflamed small bowel loops are noted in lower abdomen and pelvis as well as mild sigmoid wall thickening suggesting possible inflammation there is well. It is uncertain if the pneumoperitoneum is secondary to ruptured small or large bowel. Critical Value/emergent results were called by telephone at the time of interpretation on 02/14/2023 at 1:22 pm to provider JOSEPH ZAMMIT , who verbally acknowledged these results. Electronically Signed   By: Lynwood Landy Raddle M.D.   On: 02/14/2023 13:24   ECHOCARDIOGRAM COMPLETE Result Date: 01/26/2023    ECHOCARDIOGRAM REPORT   Patient Name:   ROCKO FESPERMAN Date of Exam: 01/26/2023 Medical Rec #:  [id number]           Height:       74.0 in Accession #:    [id number]          Weight:       247.0 lb Date of Birth:  [birth year]          BSA:          2.377 m Patient Age:    47 years            BP:           122/99 mmHg Patient Gender: M                   HR:           72 bpm. Exam Location:  [facility] Procedure: 2D Echo, 3D Echo, Cardiac Doppler, Color Doppler and Strain Analysis Indications:    I50.40* Unspecified combined systolic (congestive) and diastolic                 (congestive) heart failure  History:        Patient has prior history of Echocardiogram examinations, most                 recent 02/16/2020. Cardiomyopathy and CHF,                 Signs/Symptoms:Shortness of Breath, Dyspnea and Fatigue; Risk                 Factors:Hypertension and Dyslipidemia. Pulmonary embolus.  Sonographer:    Ellouise Mose RDCS Referring Phys: 56 TESSA N CONTE  Sonographer Comments: Image acquisition challenging due to patient body habitus and Image acquisition challenging due to respiratory motion. IMPRESSIONS  1. Left ventricular ejection fraction, by estimation, is 50 to 55%. The left ventricle has low normal function. The left ventricle has no regional wall motion abnormalities. The left  ventricular internal cavity size was mildly dilated. There is mild concentric left ventricular hypertrophy. Left ventricular diastolic parameters are consistent with Grade I diastolic dysfunction (impaired relaxation). The average left ventricular global longitudinal strain is -19.3 %. The global longitudinal strain is normal.  2. Right ventricular systolic function is normal. The right ventricular size is normal. Tricuspid regurgitation signal is inadequate for assessing PA pressure.  3. The mitral valve is grossly normal. Mild mitral valve regurgitation.  4. The aortic valve is tricuspid. Aortic valve regurgitation is not visualized. No aortic stenosis is present. Aortic valve mean gradient measures 3.0 mmHg.  5. Aortic dilatation noted. There is moderate dilatation of the aortic root, measuring 44 mm.  6. The inferior vena cava is normal in size with greater than  50% respiratory variability, suggesting right atrial pressure of 3 mmHg. Comparison(s): Prior images reviewed side by side. LVEF improved significantly in comparison, now 50-55% range. Mitral regurgitation is mild. Moderately dilated aortic root. FINDINGS  Left Ventricle: Left ventricular ejection fraction, by estimation, is 50 to 55%. The left ventricle has low normal function. The left ventricle has no regional wall motion abnormalities. The average left ventricular global longitudinal strain is -19.3 %. The global longitudinal strain is normal. The left ventricular internal cavity size was mildly dilated. There is mild concentric left ventricular hypertrophy. Left ventricular diastolic parameters are consistent with Grade I diastolic dysfunction (impaired relaxation). Right Ventricle: The right ventricular size is normal. No increase in right ventricular wall thickness. Right ventricular systolic function is normal. Tricuspid regurgitation signal is inadequate for assessing PA pressure. Left Atrium: Left atrial size was normal in size. Right Atrium:  Right atrial size was normal in size. Pericardium: There is no evidence of pericardial effusion. Presence of epicardial fat layer. Mitral Valve: The mitral valve is grossly normal. Mild mitral valve regurgitation. Tricuspid Valve: The tricuspid valve is grossly normal. Tricuspid valve regurgitation is trivial. Aortic Valve: The aortic valve is tricuspid. There is mild aortic valve annular calcification. Aortic valve regurgitation is not visualized. No aortic stenosis is present. Aortic valve mean gradient measures 3.0 mmHg. Aortic valve peak gradient measures 5.1 mmHg. Aortic valve area, by VTI measures 3.51 cm. Pulmonic Valve: The pulmonic valve was grossly normal. Pulmonic valve regurgitation is trivial. Aorta: Aortic dilatation noted. There is moderate dilatation of the aortic root, measuring 44 mm. Venous: The inferior vena cava is normal in size with greater than 50% respiratory variability, suggesting right atrial pressure of 3 mmHg. IAS/Shunts: No atrial level shunt detected by color flow Doppler.  LEFT VENTRICLE PLAX 2D LVIDd:         6.20 cm      Diastology LVIDs:         5.20 cm      LV e' medial:    4.46 cm/s LV PW:         1.10 cm      LV E/e' medial:  11.1 LV IVS:        0.90 cm      LV e' lateral:   8.70 cm/s LVOT diam:     2.40 cm      LV E/e' lateral: 5.7 LV SV:         72 LV SV Index:   30           2D Longitudinal Strain LVOT Area:     4.52 cm     2D Strain GLS Avg:     -19.3 %  LV Volumes (MOD) LV vol d, MOD A2C: 138.0 ml 3D Volume EF: LV vol d, MOD A4C: 113.0 ml 3D EF:        51 % LV vol s, MOD A2C: 62.5 ml  LV EDV:       214 ml LV vol s, MOD A4C: 55.6 ml  LV ESV:       105 ml LV SV MOD A2C:     75.5 ml  LV SV:        109 ml LV SV MOD A4C:     113.0 ml LV SV MOD BP:      66.7 ml RIGHT VENTRICLE RV S prime:     10.90 cm/s TAPSE (M-mode): 2.1 cm LEFT ATRIUM  Index        RIGHT ATRIUM           Index LA diam:        2.30 cm 0.97 cm/m   RA Area:     11.90 cm LA Vol (A2C):   71.1 ml  29.91 ml/m  RA Volume:   26.30 ml  11.06 ml/m LA Vol (A4C):   37.9 ml 15.94 ml/m LA Biplane Vol: 54.7 ml 23.01 ml/m  AORTIC VALVE AV Area (Vmax):    3.35 cm AV Area (Vmean):   3.31 cm AV Area (VTI):     3.51 cm AV Vmax:           113.00 cm/s AV Vmean:          73.300 cm/s AV VTI:            0.206 m AV Peak Grad:      5.1 mmHg AV Mean Grad:      3.0 mmHg LVOT Vmax:         83.70 cm/s LVOT Vmean:        53.600 cm/s LVOT VTI:          0.160 m LVOT/AV VTI ratio: 0.78  AORTA Ao Root diam: 4.40 cm Ao Asc diam:  3.65 cm MITRAL VALVE MV Area (PHT): 3.21 cm    SHUNTS MV Decel Time: 236 msec    Systemic VTI:  0.16 m MV E velocity: 49.70 cm/s  Systemic Diam: 2.40 cm MV A velocity: 72.80 cm/s MV E/A ratio:  0.68 Jayson Sierras MD Electronically signed by Jayson Sierras MD Signature Date/Time: 01/26/2023/4:11:39 PM    Final     Assessment  48 y.o. 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  His workup this admission has revealed pneumoperitoneum and leukocytosis with unknown source but was also noted to have mildly dilated and inflamed small bowel loops in the lower abdomen and pelvis with mild sigmoid wall thickening suggesting possible inflammation.  GI consulted due to concern for upper GI bleed.  Concern for upper GI bleed, hematemesis: Noted an episode of vomiting on Wednesday and NG tube was replaced with 1.3 L output and was noticed to be brown/red in color initially as well as later on in the evening.  Patient coughed/vomited a small amount of blood with a blood clot as well and his heparin  drip was placed on hold.  Hemoglobin slightly lower today at 10.8 but overall stable compared to the last 5 days.  Output this morning was  bilious in nature prior to removal he has not had any vomiting episodes thus far this morning.  Previously noted brown/red output likely secondary to trauma from NG tube insertion or possible suction injury from NG tube.  Unable to completely rule out Mallory-Weiss tear, gastritis/peptic ulcer disease.  Repeat CT exam yesterday suggesting postoperative ileus with 7.5 cm postop hematoma inferior to the cecum with presence of JP drain.  CT on admission with concern for sigmoid wall thickening.  BUN has been within normal limits not suggesting any active bleed.  GI consulted for EGD for evaluation of his emesis however given his current condition with postoperative ileus he would be at a high risk for frank perforation with insufflation for procedures therefore we  will plan to pursue EGD as well as colonoscopy given sigmoid findings outpatient and 6 as 8 weeks.  Pneumoperitoneum of unclear etiology/postoperative ileus: Course complicated by postoperative ileus.  Repeat CT A/P 02/23/2023 with mildly dilated proximal small bowel loops noted most likely representing postoperative ileus with 7.5 x 4.2 cm postoperative hematoma inferior to the cecum.  Remains mildly tender on exam with improvement in abdominal distention.  He is closely followed by general surgery.  Plan / Recommendations  Continue IV PPI BID Trend H/H, monitor for ongoing GI bleeding Diet per general surgery Plan for EGD/colonoscopy outpatient in 6-8 weeks   GI will likely sign off for now.  If any overt bleeding occurs or if any additional questions or concerns, feel free to reach back out.   LOS: 10 days    02/24/2023, 10:22 AM   Charmaine Melia, MSN, FNP-BC, AGACNP-BC [facility]

## 2023-02-24 NOTE — Progress Notes (Signed)
 PHARMACY - TOTAL PARENTERAL NUTRITION CONSULT NOTE   Indication: Prolonged ileus  Patient Measurements: Height: 6' 2 (188 cm) Weight: 105.8 kg (233 lb 4 oz) IBW/kg (Calculated) : 82.2 TPN AdjBW (KG): 95.8 Body mass index is 29.95 kg/m. Usual Weight:   Assessment: 48 yo male with h/o RA on prednisone , gout PE/DVT in 2021 on Eliquis , HTN, present ed with acute abdominal pain. Pt admitted with perforated viscus s/p ex lap. GI concerned for coffee-ground aspirate from NGTube. CT suggesting postop ileus and postop hematoma at insertion of JP drain. Heparin  drip on hold d/t bright red blood from NGT.  Patient remains NPO and surgery planning to start TPN.   Glucose / Insulin : BS 97 Electrolytes: K 3.6 Mag 1.7 Phos 3.8 Alb 2.8 Renal: Scr 0.81 Hepatic: WNL . TBili 1.8 Intake / Output; MIVF: LR @ 70mls/hr GI Imaging: 02/23/23 CT shows post op ileus and hematoma left rectus muscle at insertion of surgical drain.  GI Surgeries / Procedures: 02/19/23 exp lap for pneumoperitoneum.   Central access: 02/23/23. PICC line, double lumen TPN start date: 02/24/2023  Nutritional Goals: Goal TPN rate is 110 mL/hr (provides 150 g of protein and ~2500 kcals per day)  RD Assessment:  F/U  Current Nutrition:  NPO  Plan:  Start TPN at 40mL/hr at 1800 Electrolytes in TPN: Na 50mEq/L, K 50mEq/L, Ca 30mEq/L, Mg 64mEq/L, and Phos 15mmol/L. Cl:Ac 1:1 Add standard MVI and trace elements to TPN Initiate Sensitive q8h SSI and adjust as needed  Reduce MIVF to 50 mL/hr at 1800 Monitor TPN labs on Mon/Thurs, tomorrow  Cherlyn Boers, BS Pharm D, BCPS Clinical Pharmacist 02/24/2023,8:07 AM

## 2023-02-24 NOTE — Telephone Encounter (Signed)
 Please arrange hospital follow-up with Dr. Tasia Catchings or [facility] in 3-4 weeks to discuss scheduling colonoscopy in 6-8 weeks.  If no availability then can be with myself.   Brooke Bonito, MSN, APRN, FNP-BC, AGACNP-BC [facility]

## 2023-02-25 ENCOUNTER — Other Ambulatory Visit: Payer: Self-pay | Admitting: Physician Assistant

## 2023-02-25 DIAGNOSIS — Z5181 Encounter for therapeutic drug level monitoring: Secondary | ICD-10-CM

## 2023-02-25 DIAGNOSIS — R768 Other specified abnormal immunological findings in serum: Secondary | ICD-10-CM

## 2023-02-25 DIAGNOSIS — Z79899 Other long term (current) drug therapy: Secondary | ICD-10-CM

## 2023-02-25 DIAGNOSIS — M0600 Rheumatoid arthritis without rheumatoid factor, unspecified site: Secondary | ICD-10-CM

## 2023-02-25 DIAGNOSIS — R198 Other specified symptoms and signs involving the digestive system and abdomen: Secondary | ICD-10-CM | POA: Diagnosis not present

## 2023-02-25 DIAGNOSIS — M2550 Pain in unspecified joint: Secondary | ICD-10-CM

## 2023-02-25 DIAGNOSIS — D6869 Other thrombophilia: Secondary | ICD-10-CM | POA: Diagnosis not present

## 2023-02-25 DIAGNOSIS — E860 Dehydration: Secondary | ICD-10-CM | POA: Diagnosis not present

## 2023-02-25 DIAGNOSIS — M1A09X0 Idiopathic chronic gout, multiple sites, without tophus (tophi): Secondary | ICD-10-CM

## 2023-02-25 LAB — GLUCOSE, CAPILLARY
Glucose-Capillary: 135 mg/dL — ABNORMAL HIGH (ref 70–99)
Glucose-Capillary: 117 mg/dL — ABNORMAL HIGH (ref 70–99)

## 2023-02-25 LAB — COMPREHENSIVE METABOLIC PANEL
ALT: 17 U/L (ref 0–44)
AST: 16 U/L (ref 15–41)
Albumin: 2.8 g/dL — ABNORMAL LOW (ref 3.5–5.0)
Alkaline Phosphatase: 38 U/L (ref 38–126)
Anion gap: 8 (ref 5–15)
BUN: 7 mg/dL (ref 6–20)
CO2: 29 mmol/L (ref 22–32)
Calcium: 8.4 mg/dL — ABNORMAL LOW (ref 8.9–10.3)
Chloride: 99 mmol/L (ref 98–111)
Creatinine, Ser: 0.78 mg/dL (ref 0.61–1.24)
GFR, Estimated: >60 mL/min (ref >60)
Glucose, Bld: 123 mg/dL — ABNORMAL HIGH (ref 70–99)
Potassium: 3.7 mmol/L (ref 3.5–5.1)
Sodium: 136 mmol/L (ref 135–145)
Total Bilirubin: 1.5 mg/dL — ABNORMAL HIGH (ref 0.0–1.2)
Total Protein: 6.5 g/dL (ref 6.5–8.1)

## 2023-02-25 LAB — PHOSPHORUS: Phosphorus: 4.1 mg/dL (ref 2.5–4.6)

## 2023-02-25 LAB — MAGNESIUM: Magnesium: 2 mg/dL (ref 1.7–2.4)

## 2023-02-25 MED ORDER — TRAVASOL 10 % IV SOLN
INTRAVENOUS | Status: DC
  Filled 2023-02-25: qty 1368

## 2023-02-25 MED ORDER — MAGNESIUM SULFATE 2 GM/50ML IV SOLN
2.0000 g | Freq: Once | INTRAVENOUS | Status: AC
  Administered 2023-02-25: 2 g via INTRAVENOUS
  Filled 2023-02-25: qty 50

## 2023-02-25 MED ORDER — ENOXAPARIN SODIUM 100 MG/ML IJ SOSY
100.0000 mg | PREFILLED_SYRINGE | Freq: Two times a day (BID) | INTRAMUSCULAR | Status: DC
  Administered 2023-02-25 – 2023-02-26 (×2): 100 mg via SUBCUTANEOUS
  Filled 2023-02-25 (×2): qty 1

## 2023-02-25 MED ORDER — POTASSIUM CHLORIDE 10 MEQ/100ML IV SOLN
10.0000 meq | INTRAVENOUS | Status: AC
  Administered 2023-02-25 (×2): 10 meq via INTRAVENOUS
  Filled 2023-02-25 (×2): qty 100

## 2023-02-25 MED ORDER — INSULIN ASPART 100 UNIT/ML IJ SOLN: 0.0000 [IU] | Freq: Three times a day (TID) | INTRAMUSCULAR | Status: DC

## 2023-02-25 NOTE — Progress Notes (Signed)
 PROGRESS NOTE  Ryan Gibson  FMW:[id number] DOB: 17-Aug-1975 DOA: 02/14/2023 PCP: Cook, Jayce G, DO   Chief Complaint  Patient presents with   Abdominal Pain   Emesis   Diarrhea   Level of care: Med-Surg  Brief Admission History:  48 year old male with history of PE on apixaban  (last dose taken 12/23 at 6pm), seronegative rheumatoid arthritis, hyperlipidemia, dilated cardiomyopathy on GDMT, chronic systolic heart failure with EF improved to 50-55% on most recent echo done 01/26/23, gout, hypogonadism on testosterone , erythrocytosis thought from testosterone , essential hypertension, generalized anxiety disorder reportedly had just been referred to have a screening colonoscopy when he saw his PCP earlier this month.  He presented from home by EMS complaining of severe generalized abdominal pain with nausea vomiting and diarrhea that started early this morning.  The pain is 10/10.  He feels very ill and having palpitations.  He denies fever and chills.  In the ED he was noted to have a rigid abdomen and sent for CT abdomen pelvis with findings of concern for a perforated abdominal viscus.  Emergent surgical consultation requested.  He is started on IV antibiotics and IV fluids and admission requested for further management. The patient underwent exploratory laparotomy for his pneumoperitoneum on 02/14/2023.  A JP drain was placed over the stomach and into the pelvis.  General surgery continue to follow-up for postoperative care.  His hospitalization has been prolonged secondary to patient removal of his NG tube and postoperative ileus. NG tube was replaced on 02/17/2023 putting out 1500 cc.   Assessment and Plan:  Sepsis from intraabdominal infection/pneumoperitoneum - pt emergently taken to OR on 12/24 by Dr. Kallie for exploratory lap -sepsis physiology resolving and markedly improved  -completed Zosyn  -completed IV fluids -Lactic acid 2.9>> 1.4 -lactic acidosis resolved  -sepsis  physiology resolved   Perforated abdominal viscous with pneumoperitoneum/postoperative ileus -Dr. Kallie took him to OR emergently after Kcentra  treatment on 12/24  -continue supportive measures -NG came out x2 -NG tube replaced 02/17/2023 with 1500 cc output -NG put out at least 900 cc 1/1 and now back on continuous suction -may clamp NG short time to allow for ambulation hallway -12/29--tiny BM, small flatus;  KUB with gas in colon -12/30--tiny BM, passing flatus; KUB with gas in colon -12/31--started on clears, now on full liquids  -1/1 --tried de-escalating IV opioid medication but pt did not tolerate -1/1 -- pt developed some hematemesis, IV heparin  stopped   -1/2 -- CT abd/pelv with IV contrast: ileus, 2 small postop hematomas seen; Hg remains stable at 11.  Starting ambulation in halls every 4 hours while awake.  - TPN started 1/3 per surgery  - 1/3 -- had large bowel movement, PICC line placed and TPN started - 1/4 -- soft foods started per surgery; continue to encourage ambulation     History of PE/ Acquired thrombophilia - Patient diagnosed with PE and right lower extremity DVT 01/2020 - Felt to be secondary to underlying COVID-19 infection - last dose of apixaban  was taken 12/23 at 6pm - K-centra was given 12/24   - IV heparin  started no bolus 12/25 per surgery, HELD on 1/2  - Monitor hemoglobin>>remains stable at 11  - IV heparin  currently on hold with small hematomas in abdomen found and hematemesis   Chronic HFrEF -02/05/2023 echo EF 55%, no WMA, normal RVF, grade 1 DD -02/16/2020 echo EF < 20 % -Initially felt to be secondary to alcohol abuse -Entresto  on hold until patient able to tolerate  p.o. -Holding Jardiance  secondary to acute infectious process -Metoprolol  succinate transition to IV Lopressor  until able to tolerate p.o. -Restart spironolactone  when able to tolerate p.o. -Appears clinically euvolemic   Leukemoid reaction - RESOLVED  - secondary to  abdominal infection and surgery - following blood cultures x 2 : no growth to date  - unfortunately IV ceftriaxone  had already been given prior to blood cultures - WBC improved to normal    Hypomagnesemia - IV replacement ordered and repleted  - keep mag >2 if possible, additional 4 gm IV given 1/1, 1/2, 1/3, 1/4    Depression/GAD / alcohol dependence - IV lorazepam  ordered per CIWA protocol  - supplemental vitamins per CIWA protocol  - No signs of alcohol withdrawal - cymbalta  has been restarted  - Continue folic acid  and thiamine    Essential hypertension -Patient was on Entresto  and metoprolol  succinate prior to admission -Continue IV Lopressor  for now   Normocytic anemia  -continue daily morning CBC  -Hgb stable   Hyperbilirubinemia - in the setting of alcohol dependence   Generalized abdominal pain  - severe pain being treated with IV medications - IV hydromorphone  ordered as needed    Rheumatoid arthritis  - he is relatively immunocompromised - his rheumatologist has him on prednisone    Tobacco user - nicotine  patch ordered if needed for cravings    Sinus tachycardia - resolved now - likely having beta blocker rebound tachycardia  - IV lopressor  while NPO with hold parameters - resume home oral metoprolol  XL when able to take p.o. better   Hypokalemia / Hypomagnesemia -Repleted additional IV Mg given 1/1 and 1/2, 1/3 -Check magnesium  in AM    DVT prophylaxis: IV heparin  Code Status: Full  Family Communication: wife at bedside 1/2 updated  Disposition: home    Consultants:  Surgery   Procedures:  Exp lap, open lesser sac and greater sac, JP drain placement 02/14/23   Antimicrobials:    Subjective: Pt reports he had a very large bowel movement yesterday.  He says he is feeling hungry.   Pt has been ambulating the halls.   Objective: Vitals:   02/24/23 1933 02/25/23 0431 02/25/23 0500 02/25/23 1325  BP: (!) 133/95 (!) 130/90  128/85  Pulse: (!) 105  (!) 104  100  Resp: 20 16  17   Temp: 97.6 F (36.4 C) 98.3 F (36.8 C)  98.4 F (36.9 C)  TempSrc: Oral Oral  Oral  SpO2: 97% 94%  96%  Weight:   106.4 kg   Height:        Intake/Output Summary (Last 24 hours) at 02/25/2023 1519 Last data filed at 02/25/2023 1326 Gross per 24 hour  Intake 1243.76 ml  Output --  Net 1243.76 ml   Filed Weights   02/23/23 0300 02/24/23 0425 02/25/23 0500  Weight: 109.5 kg 105.8 kg 106.4 kg   Examination:  General exam: Appears calm and comfortable  Respiratory system: Clear to auscultation. Respiratory effort normal. Cardiovascular system: normal S1 & S2 heard. No JVD, murmurs, rubs, gallops or clicks. No pedal edema. Gastrointestinal system: Abdomen is much less distended, soft and mildly tender. No organomegaly or masses felt. Hypoactive bowel sounds heard. Central nervous system: Alert and oriented. No focal neurological deficits. Extremities: Symmetric 5 x 5 power. Skin: No rashes, lesions or ulcers. Psychiatry: Judgement and insight appear normal. Mood & affect appropriate.   Data Reviewed: I have personally reviewed following labs and imaging studies  CBC: Recent Labs  Lab 02/19/23 0348 02/20/23 0522 02/21/23  9560 02/22/23 0433 02/23/23 0421 02/24/23 0356  WBC 6.2 7.0 7.3 7.1 6.3 10.0  NEUTROABS 4.0  --   --   --   --   --   HGB 11.5* 11.5* 11.1* 11.0* 11.4* 10.8*  HCT 32.9* 33.3* 33.0* 32.6* 33.0* 32.8*  MCV 96.5 96.2 97.6 96.7 95.7 98.8  PLT 205 248 274 285 299 324    Basic Metabolic Panel: Recent Labs  Lab 02/20/23 0522 02/21/23 0439 02/22/23 0433 02/23/23 0421 02/24/23 0356 02/25/23 0420  NA 135 135 134* 134* 136 136  K 3.2* 3.3* 3.3* 3.4* 3.6 3.7  CL 101 102 99 98 100 99  CO2 21* 21* 27 25 25 29   GLUCOSE 83 77 102* 105* 97 123*  BUN 5* <5* <5* <5* 5* 7  CREATININE 0.75 0.71 0.65 0.71 0.81 0.78  CALCIUM  8.3* 8.3* 8.5* 8.6* 8.5* 8.4*  MG 1.7 1.8 1.7 1.8 1.7 2.0  PHOS 3.1 2.9 3.9  --  3.8 4.1   CBG: Recent  Labs  Lab 02/25/23 1109  GLUCAP 135*    No results found for this or any previous visit (from the past 240 hours).    Radiology Studies: US  EKG SITE RITE Result Date: 02/23/2023 If Site Rite image not attached, placement could not be confirmed due to current cardiac rhythm.   Scheduled Meds:  Chlorhexidine  Gluconate Cloth  6 each Topical Daily   colchicine   0.6 mg Oral Daily   DULoxetine   60 mg Oral Daily   enoxaparin  (LOVENOX ) injection  100 mg Subcutaneous Q12H   folic acid   1 mg Oral Daily   insulin  aspart  0-9 Units Subcutaneous Q8H   metoprolol  tartrate  2.5 mg Intravenous Q6H   nicotine   14 mg Transdermal q1600   pantoprazole  (PROTONIX ) IV  40 mg Intravenous Q12H   sodium chloride  flush  10-40 mL Intracatheter Q12H   spironolactone   25 mg Oral Daily   thiamine   100 mg Oral Daily   Or   thiamine   100 mg Intravenous Daily   Continuous Infusions:  TPN ADULT (ION) 40 mL/hr at 02/25/23 0312   TPN ADULT (ION)      LOS: 11 days   Time spent: 55 mins  Jamielyn Petrucci Vicci, MD How to contact the [facility] Attending or Consulting provider 7A - 7P or covering provider during after hours 7P -7A, for this patient?  Check the care team in [facility] and look for a) attending/consulting TR[facility] provider listed and b) the TR[facility] team listed Log into [email or web address] to find provider on call.  Locate the [facility] provider you are looking for under [facility] and page to a number that you can be directly reached. If you still have difficulty reaching the provider, please page the [facility] (Director on Call) for the Hospitalists listed on amion for assistance.  02/25/2023, 3:19 PM

## 2023-02-25 NOTE — Plan of Care (Signed)

## 2023-02-25 NOTE — Progress Notes (Signed)
 PHARMACY - TOTAL PARENTERAL NUTRITION CONSULT NOTE   Indication: Prolonged ileus  Patient Measurements: Height: 6' 2 (188 cm) Weight: 106.4 kg (234 lb 9.1 oz) IBW/kg (Calculated) : 82.2 TPN AdjBW (KG): 95.8 Body mass index is 30.12 kg/m. Usual Weight:   Assessment: 48 yo male with h/o RA on prednisone , gout PE/DVT in 2021 on Eliquis , HTN, present ed with acute abdominal pain. Pt admitted with perforated viscus s/p ex lap. GI concerned for coffee-ground aspirate from NGTube. CT suggesting postop ileus and postop hematoma at insertion of JP drain. Heparin  drip on hold d/t bright red blood from NGT.  Patient remains NPO and surgery started TPN. Still awaiting bowel function.  Glucose / Insulin : BS 97 > 123 Electrolytes: K 3.6> 3.7 Mag 1.7> 1.9 Phos 3.8> 4.1 Alb 2.8 Renal: Scr 0.81 Hepatic: WNL . TBili 1.8> 1.5 Intake / Output; MIVF: LR @ 44mls/hr=> transition off and continue with TPN at 171mls/hr GI Imaging: 02/23/23 CT shows post op ileus and hematoma left rectus muscle at insertion of surgical drain.  GI Surgeries / Procedures: 02/19/23 exp lap for pneumoperitoneum.   Central access: 02/23/23. PICC line, double lumen TPN start date: 02/24/2023  Nutritional Goals: Goal TPN rate is 110 mL/hr (provides 150 g of protein and ~2500 kcals per day)  RD Assessment: Estimated Needs Total Energy Estimated Needs: 2450-2650 Total Protein Estimated Needs: 125-145 gm Total Fluid Estimated Needs: 2.5-2.7 LF/U  Current Nutrition:  NPO  Plan:  Increase TPN to 123mL/hr at 1800 Electrolytes in TPN: Na 35mEq/L, K 37mEq/L, Ca 70mEq/L, Mg 36mEq/L, and Phos 15mmol/L. Cl:Ac 1:1 MD gave additional Mag Sulfate 2gm IV x 1, will also give KCL 10meq/ml IV x 2 runs Add standard MVI and trace elements to TPN Initiate Sensitive q8h SSI and adjust as needed  Reduce MIVF to off  at 1800 Monitor TPN labs on Mon/Thurs, tomorrow  Cherlyn Boers, BS Pharm D, BCPS Clinical Pharmacist 02/25/2023,9:01 AM

## 2023-02-25 NOTE — Progress Notes (Signed)
 11 Days Post-Op  Subjective: Patient reports having large bowel movement.  He continues to pass flatus.  He is hungry.  Objective: Vital signs in last 24 hours: Temp:  [97.6 F (36.4 C)-99.8 F (37.7 C)] 98.3 F (36.8 C) (01/04 0431) Pulse Rate:  [92-109] 104 (01/04 0431) Resp:  [16-20] 16 (01/04 0431) BP: (130-151)/(89-95) 130/90 (01/04 0431) SpO2:  [94 %-97 %] 94 % (01/04 0431) Weight:  [106.4 kg] 106.4 kg (01/04 0500) Last BM Date : 02/22/23  Intake/Output from previous day: 01/03 0701 - 01/04 0700 In: 1123.8 [I.V.:779.4; IV Piggyback:344.4] Out: -  Intake/Output this shift: No intake/output data recorded.  General appearance: alert, cooperative, and no distress Resp: clear to auscultation bilaterally Cardio: regular rate and rhythm, S1, S2 normal, no murmur, click, rub or gallop GI: Soft, incision healing well.  Bowel sounds active.  Lab Results:  Recent Labs    02/23/23 0421 02/24/23 0356  WBC 6.3 10.0  HGB 11.4* 10.8*  HCT 33.0* 32.8*  PLT 299 324   BMET Recent Labs    02/24/23 0356 02/25/23 0420  NA 136 136  K 3.6 3.7  CL 100 99  CO2 25 29  GLUCOSE 97 123*  BUN 5* 7  CREATININE 0.81 0.78  CALCIUM  8.5* 8.4*   PT/INR Recent Labs    02/23/23 0421  LABPROT 14.5  INR 1.1    Studies/Results: US  EKG SITE RITE Result Date: 02/23/2023 If Site Rite image not attached, placement could not be confirmed due to current cardiac rhythm.   Anti-infectives: Anti-infectives (From admission, onward)    Start     Dose/Rate Route Frequency Ordered Stop   02/15/23 0600  cefoTEtan  (CEFOTAN ) 2 g in sodium chloride  0.9 % 100 mL IVPB        2 g 200 mL/hr over 30 Minutes Intravenous On call to O.R. 02/14/23 1442 02/14/23 1625   02/15/23 0600  piperacillin -tazobactam (ZOSYN ) IVPB 3.375 g        3.375 g 12.5 mL/hr over 240 Minutes Intravenous Every 8 hours 02/14/23 2125 02/19/23 2359   02/15/23 0000  piperacillin -tazobactam (ZOSYN ) IVPB 3.375 g  Status:   Discontinued        3.375 g 12.5 mL/hr over 240 Minutes Intravenous Every 8 hours 02/14/23 1606 02/14/23 2116   02/14/23 2230  piperacillin -tazobactam (ZOSYN ) IVPB 3.375 g  Status:  Discontinued        3.375 g 12.5 mL/hr over 240 Minutes Intravenous Every 8 hours 02/14/23 2116 02/14/23 2125   02/14/23 2215  piperacillin -tazobactam (ZOSYN ) IVPB 3.375 g        3.375 g 12.5 mL/hr over 240 Minutes Intravenous STAT 02/14/23 2124 02/15/23 0135   02/14/23 1504  sodium chloride  0.9 % with cefoTEtan  (CEFOTAN ) ADS Med       Note to Pharmacy: Sherre Constant N: cabinet override      02/14/23 1504 02/14/23 1625   02/14/23 1500  piperacillin -tazobactam (ZOSYN ) IVPB 3.375 g  Status:  Discontinued        3.375 g 100 mL/hr over 30 Minutes Intravenous  Once 02/14/23 1446 02/14/23 2125   02/14/23 1345  cefTRIAXone  (ROCEPHIN ) 2 g in sodium chloride  0.9 % 100 mL IVPB        2 g 200 mL/hr over 30 Minutes Intravenous  Once 02/14/23 1341 02/14/23 1443       Assessment/Plan: s/p Procedure(s): EXPLORATORY LAPAROTOMY, wash out, drain placement Impression: Postoperative ileus seems to be resolving.  Patient would like to try a soft diet.  Will continue  TPN for now.  Continue heparin  drip for now.  LOS: 11 days    Oneil Budge 02/25/2023

## 2023-02-25 NOTE — Progress Notes (Signed)
 Patient has ambulated around the unit independently this shift. Has required pain medications prn as ordered x2.

## 2023-02-26 LAB — CBC
HCT: 30.2 % — ABNORMAL LOW (ref 39.0–52.0)
Hemoglobin: 9.4 g/dL — ABNORMAL LOW (ref 13.0–17.0)
MCH: 36.7 pg — ABNORMAL HIGH (ref 26.0–34.0)
MCHC: 31.1 g/dL (ref 30.0–36.0)
MCV: 118 fL — ABNORMAL HIGH (ref 80.0–100.0)
Platelets: 306 10*3/uL (ref 150–400)
RBC: 2.56 MIL/uL — ABNORMAL LOW (ref 4.22–5.81)
RDW: 14.5 % (ref 11.5–15.5)
WBC: 6.5 10*3/uL (ref 4.0–10.5)
nRBC: 0.3 % — ABNORMAL HIGH (ref 0.0–0.2)

## 2023-02-26 LAB — GLUCOSE, CAPILLARY
Glucose-Capillary: 109 mg/dL — ABNORMAL HIGH (ref 70–99)
Glucose-Capillary: 108 mg/dL — ABNORMAL HIGH (ref 70–99)

## 2023-02-26 MED ORDER — HYDROCODONE-ACETAMINOPHEN 10-325 MG PO TABS: 1.0000 | ORAL_TABLET | Freq: Four times a day (QID) | ORAL | 0 refills | Status: DC | PRN

## 2023-02-26 MED ORDER — ONDANSETRON HCL 4 MG PO TABS: 4.0000 mg | ORAL_TABLET | Freq: Three times a day (TID) | ORAL | 1 refills | Status: DC | PRN

## 2023-02-26 NOTE — Progress Notes (Addendum)
 PICC line removed. Patient tolerated procedure well. Vaseline gauze & Tegaderm dressing applied. Pressure applied. No bleeding. VSS. Catheter intact. Spoke with [facility] & Dr. Vicci, patient is okay to discharge post removal. Education provided on limited use of arm & to leave dressing in place.

## 2023-02-26 NOTE — Discharge Summary (Signed)
 Physician Discharge Summary  Patient ID: Ryan Gibson MRN: [id number] DOB/AGE: 03/19/1975 48 y.o.  Admit date: 02/14/2023 Discharge date: 02/26/2023  Admission Diagnoses: Pneumoperitoneum  Discharge Diagnoses: Postoperative ileus Principal Problem:   Perforation of viscus Active Problems:   Gout   Hypogonadism male   Dilated cardiomyopathy (HCC)   Hyperlipidemia   History of pulmonary embolism   Chronic systolic heart failure (HCC)   Anxiety   Seronegative rheumatoid arthritis (HCC)   High risk medication use   Erythrocytosis   Sinus tachycardia   Acquired thrombophilia (HCC)   Severe dehydration   Generalized abdominal pain   Leukocytosis   Hyperbilirubinemia   Severe sepsis (HCC)   Discharged Condition: good  Hospital Course: Patient is a 48 year old white male with multiple medical problems including history of pulmonary, cardiomyopathy, and rheumatoid arthritis who presented to the emergency room with abdominal pain.  CT scan of the abdomen revealed pneumoperitoneum.  The patient was taken to the operating room on 02/14/2023 by Dr. Kallie and underwent exploratory laparotomy.  Purulent fluid was found, but no discrete perforation was seen.  Multiple drains were placed.  The patient's postoperative course was remarkable for a prolonged postoperative ileus.  The patient was started on TPN.  Follow-up x-ray studies revealed only a left rectus hematoma.  There was no significant intra-abdominal process to explain the ileus.  The patient's bowel function started to return on 02/24/2023.  His diet was progressed.  The patient is being discharged home on 02/26/2023 in good and improving condition.  Treatments: surgery: Exploratory laparotomy on 02/14/2023  Discharge Exam: Blood pressure (!) 140/99, pulse 100, temperature 98.1 F (36.7 C), temperature source Oral, resp. rate 20, height 6' 2 (1.88 m), weight 107.5 kg, SpO2 96%. General appearance: alert, cooperative, and no  distress Resp: clear to auscultation bilaterally Cardio: regular rate and rhythm, S1, S2 normal, no murmur, click, rub or gallop GI: Soft with active bowel sounds.  Incision healing well.  Staples removed, Steri-Strips applied.  Disposition: Discharge disposition: 01-Home or Self Care       Discharge Instructions     Diet - low sodium heart healthy   Complete by: As directed    Increase activity slowly   Complete by: As directed       Allergies as of 02/26/2023       Reactions   Vancomycin  Hives   hives post op on R arm -site of infusion, and whole body itching        Medication List     STOP taking these medications    oxyCODONE -acetaminophen  5-325 MG tablet Commonly known as: PERCOCET/ROXICET       TAKE these medications    colchicine  0.6 MG tablet TAKE 1 TABLET BY MOUTH EVERY DAY   DULoxetine  60 MG capsule Commonly known as: CYMBALTA  Take 1 capsule (60 mg total) by mouth daily.   Eliquis  5 MG Tabs tablet Generic drug: apixaban  Take 1 tablet (5 mg total) by mouth 2 (two) times daily.   empagliflozin  10 MG Tabs tablet Commonly known as: Jardiance  Take 1 tablet (10 mg total) by mouth daily before breakfast.   Entresto  49-51 MG Generic drug: sacubitril -valsartan  Take 1 tablet by mouth 2 (two) times daily.   Febuxostat  80 MG Tabs TAKE 1 TABLET BY MOUTH EVERY DAY   furosemide  40 MG tablet Commonly known as: LASIX  Take 80 mg by mouth daily.   HYDROcodone -acetaminophen  10-325 MG tablet Commonly known as: Norco Take 1 tablet by mouth every 6 (six) hours as  needed.   metoprolol  succinate 100 MG 24 hr tablet Commonly known as: TOPROL -XL Take 1.5 tablets (150 mg total) by mouth daily. Take with or immediately following a meal.   ondansetron  4 MG tablet Commonly known as: Zofran  Take 1 tablet (4 mg total) by mouth every 8 (eight) hours as needed for nausea or vomiting.   predniSONE  5 MG tablet Commonly known as: DELTASONE  Take 4 tabs po qd x 14  days, 3  tabs po qd x 14 days, 2  tabs po qd x 14 days, 1  tab po qd x 14 days What changed:  how much to take how to take this when to take this additional instructions   spironolactone  25 MG tablet Commonly known as: ALDACTONE  Take 1 tablet (25 mg total) by mouth daily.        Follow-up Information     Kallie Manuelita BROCKS, MD. Call on 02/28/2023.   Specialty: General Surgery Why: As needed Contact information: [address] [place] [facility] [address] [phone]                 Signed: Oneil Budge 02/26/2023, 9:56 AM

## 2023-02-26 NOTE — Progress Notes (Signed)
 Pt.assessed with no complaints or concerns after PICC removal. Ok for pt.to leave now per Dr.Johnson.  Pt.reviewing paperwork for discharge. All questions and concerns addressed.  All belongings reviewed and returned to pt. All questions and concerns addressed.

## 2023-02-26 NOTE — Plan of Care (Signed)
   Problem: Education: Goal: Knowledge of General Education information will improve Description: Including pain rating scale, medication(s)/side effects and non-pharmacologic comfort measures Outcome: Adequate for Discharge   Problem: Health Behavior/Discharge Planning: Goal: Ability to manage health-related needs will improve Outcome: Adequate for Discharge   Problem: Clinical Measurements: Goal: Ability to maintain clinical measurements within normal limits will improve Outcome: Adequate for Discharge Goal: Will remain free from infection Outcome: Adequate for Discharge

## 2023-02-27 ENCOUNTER — Telehealth: Payer: Self-pay | Admitting: *Deleted

## 2023-02-27 NOTE — Telephone Encounter (Signed)
 Received fax requesting prior authorization for Hydrocodone/APAP.  PA submitted via CoverMyMeds.  Dx: R19.8/ Z48.89  Received immediate determination.   PA approved. Call placed to pharmacy to make aware.

## 2023-02-28 ENCOUNTER — Encounter: Payer: Self-pay | Admitting: General Surgery

## 2023-02-28 ENCOUNTER — Ambulatory Visit: Payer: BC Managed Care – PPO | Admitting: Pharmacist

## 2023-02-28 ENCOUNTER — Ambulatory Visit (INDEPENDENT_AMBULATORY_CARE_PROVIDER_SITE_OTHER): Payer: BC Managed Care – PPO | Admitting: General Surgery

## 2023-02-28 VITALS — BP 127/89 | HR 86 | Temp 97.6°F | Resp 16 | Ht 74.0 in | Wt 234.0 lb

## 2023-02-28 DIAGNOSIS — R198 Other specified symptoms and signs involving the digestive system and abdomen: Secondary | ICD-10-CM

## 2023-02-28 NOTE — Patient Instructions (Addendum)
 Will make sure you get colonoscopy and EGD in the next 6-8 weeks. Call with issues. Take miralax for constipation. Add back to your diet slowly. Avoid red meat for another 2 weeks.  Out of work for [employer] until 2/27.

## 2023-02-28 NOTE — Telephone Encounter (Signed)
 Pt r/s Enbrel new start to 03/21/22

## 2023-03-01 ENCOUNTER — Telehealth: Payer: Self-pay | Admitting: Family Medicine

## 2023-03-01 NOTE — Progress Notes (Signed)
 [facility]  Patient discharged on Sunday. He has not had a BM since then. He took some miralax  yesterday. He is still having significant flatus. He is reporting that he has ambulated outside multiple times yesterday. His appetite is poor but he is keeping in his fluids.   He has some dizziness at times. He has some chills but no fevers.   BP 127/89   Pulse 86   Temp 97.6 F (36.4 C) (Oral)   Resp 16   Ht 6' 2 (1.88 m)   Wt 234 lb (106.1 kg)   SpO2 96%   BMI 30.04 kg/m  Soft, staples out, evolving bruising, no erythema or drainage JP sites healed   Patient s/p Ex lap, washout drain placement for perforated viscous. No obvious source identified. The ascites culture grew E coli. If I had to make an educated hypothesis I would think due to his prednisone  and RA medication, he could not build a robust inflammatory response, and likely had a perforation of his sigmoid colon that was small and sealed off but did not mount the normal inflammatory response/ thickening that one would expect thus showing no obvious location to resect. I have discussed this with him and his wife and they understand we may never know.   He will need Egd and colonoscopy in a 6-8 weeks from surgery.  Call with issues. Take miralax  for constipation. Add back to your diet slowly. Avoid red meat for another 2 weeks.  Out of work for [employer] until 2/27.  Held Enbrel today (appt canceled by patient at my request) will get office to see when his next dose would be scheduled. It can likely be given that that time.   Future Appointments  Date Time Provider Department Center  03/21/2023  8:40 AM Cook, Jayce G, [place] [facility]  03/22/2023 11:00 AM Dayne Caroline S, RPH-CPP [facility] None  03/30/2023  1:30 PM Cheryl Waddell HERO, PA-C [facility] None  04/04/2023  9:15 AM Kallie Manuelita BROCKS, MD [facility] None  04/18/2023 10:30 AM Lucien Orren SAILOR, PA-C [facility]     Manuelita Kallie, MD [facility] [address], [place] [address] [phone] (office)

## 2023-03-01 NOTE — Telephone Encounter (Signed)
 STD & FMLA paperwork completed and faxed to [employer] Disability Team at  [phone]. Confirmation received.   Out of work starting 02/14/2023 and may return to work unrestricted on 04/20/2023.

## 2023-03-02 ENCOUNTER — Other Ambulatory Visit: Payer: Self-pay | Admitting: Physician Assistant

## 2023-03-02 DIAGNOSIS — M1A09X0 Idiopathic chronic gout, multiple sites, without tophus (tophi): Secondary | ICD-10-CM

## 2023-03-03 ENCOUNTER — Telehealth: Payer: Self-pay | Admitting: *Deleted

## 2023-03-03 NOTE — Telephone Encounter (Signed)
  Request for patient to stop medication prior to procedure or is needing cleareance  03/03/23  Ryan Gibson [birth year]  What type of surgery is being performed? COLONOSCOPY/EGD  When is surgery scheduled? TBD  What type of clearance is required (medical or pharmacy to hold medication or both? BOTH  Are there any medications that need to be held prior to surgery and how long? ELIQUIS  X 2 DAYS   Name of physician performing surgery?  Dr. Carlin Hasty [facility] Phone: [phone] Fax: [phone]  Anethesia type (none, local, MAC, general)? TBD

## 2023-03-03 NOTE — Telephone Encounter (Signed)
 Clearance sent

## 2023-03-05 NOTE — Telephone Encounter (Signed)
 Patient with diagnosis of VTE on Eliquis  for anticoagulation.    Procedure: colonoscopy/EGD Date of procedure: TBD  CrCl > 100 Platelet count 306  Acute bilateral PD, DVT in R lower extremity 01/2020  (suspected 2/2 Covid infection)  Per office protocol, patient can hold Eliquis  for 2 days prior to procedure.   Patient will not need bridging with Lovenox  (enoxaparin ) around procedure.  **This guidance is not considered finalized until pre-operative APP has relayed final recommendations.**

## 2023-03-06 NOTE — Telephone Encounter (Signed)
   Name: Ryan Gibson  DOB: 1975/11/26  MRN: [id number]  Primary Cardiologist: Gordy Bergamo, MD   Preoperative team, please contact this patient and set up a phone call appointment for further preoperative risk assessment. Please obtain consent and complete medication review. Thank you for your help.  I confirm that guidance regarding antiplatelet and oral anticoagulation therapy has been completed and, if necessary, noted below.  Per office protocol, patient can hold Eliquis  for 2 days prior to procedure.   Patient will not need bridging with Lovenox  (enoxaparin ) around procedure.  I also confirmed the patient resides in the state of [place] . As per [facility] Medical Board telemedicine laws, the patient must reside in the state in which the provider is licensed.   Wyn Raddle, Jackee Shove, NP 03/06/2023, 8:16 AM [facility]

## 2023-03-06 NOTE — Telephone Encounter (Signed)
 Left message to call back to schedule tele pre op appt.

## 2023-03-07 ENCOUNTER — Telehealth: Payer: Self-pay | Admitting: *Deleted

## 2023-03-07 ENCOUNTER — Telehealth: Payer: Self-pay

## 2023-03-07 NOTE — Telephone Encounter (Signed)
 Received call from patient [phone]~ telephone.   Patient reports that he was advised that FMLA/ STD was approved for the dates of 02/21/2023- 04/20/2023. Requested to backdate forms to 02/14/2023 when he was admitted.   Reviewed completed forms for [employer]. Forms noted to be completed with dates of 02/14/2023- 04/20/2023.  Patient reports that [employer] approved 02/21/2023- 04/20/2023 for STD. Patient reports that [employer] is third research scientist (life sciences) for disability benefits.   Advised that no forms were sent to [facility]. Forms were faxed to [employer].   Advised if [employer] is third research scientist (life sciences), they may review forms sent to [employer]. Advised that STD may have a time frame that the patient must be out of work prior to benefits beginning. Patient reports that he feels this is the cause of the discrepancy.

## 2023-03-07 NOTE — Telephone Encounter (Signed)
 Called and spoke to patient scheduled patient's telephone visit, med rec and consent done

## 2023-03-07 NOTE — Telephone Encounter (Signed)
 Patient was scheduled for telephone visit for surgical clearance and med rec and consent done     Patient Consent for Virtual Visit         Ryan Gibson has provided verbal consent on 03/07/2023 for a virtual visit (video or telephone).   CONSENT FOR VIRTUAL VISIT FOR:  Ryan Gibson  By participating in this virtual visit I agree to the following:  I hereby voluntarily request, consent and authorize [facility] and its employed or contracted physicians, physician assistants, nurse practitioners or other licensed health care professionals (the Practitioner), to provide me with telemedicine health care services (the "Services) as deemed necessary by the treating Practitioner. I acknowledge and consent to receive the Services by the Practitioner via telemedicine. I understand that the telemedicine visit will involve communicating with the Practitioner through live audiovisual communication technology and the disclosure of certain medical information by electronic transmission. I acknowledge that I have been given the opportunity to request an in-person assessment or other available alternative prior to the telemedicine visit and am voluntarily participating in the telemedicine visit.  I understand that I have the right to withhold or withdraw my consent to the use of telemedicine in the course of my care at any time, without affecting my right to future care or treatment, and that the Practitioner or I may terminate the telemedicine visit at any time. I understand that I have the right to inspect all information obtained and/or recorded in the course of the telemedicine visit and may receive copies of available information for a reasonable fee.  I understand that some of the potential risks of receiving the Services via telemedicine include:  Delay or interruption in medical evaluation due to technological equipment failure or disruption; Information transmitted may not be  sufficient (e.g. poor resolution of images) to allow for appropriate medical decision making by the Practitioner; and/or  In rare instances, security protocols could fail, causing a breach of personal health information.  Furthermore, I acknowledge that it is my responsibility to provide information about my medical history, conditions and care that is complete and accurate to the best of my ability. I acknowledge that Practitioner's advice, recommendations, and/or decision may be based on factors not within their control, such as incomplete or inaccurate data provided by me or distortions of diagnostic images or specimens that may result from electronic transmissions. I understand that the practice of medicine is not an exact science and that Practitioner makes no warranties or guarantees regarding treatment outcomes. I acknowledge that a copy of this consent can be made available to me via my patient portal [facility] MyChart), or I can request a printed copy by calling the office of [facility].    I understand that my insurance will be billed for this visit.   I have read or had this consent read to me. I understand the contents of this consent, which adequately explains the benefits and risks of the Services being provided via telemedicine.  I have been provided ample opportunity to ask questions regarding this consent and the Services and have had my questions answered to my satisfaction. I give my informed consent for the services to be provided through the use of telemedicine in my medical care

## 2023-03-08 ENCOUNTER — Encounter: Payer: Self-pay | Admitting: Physical Medicine & Rehabilitation

## 2023-03-14 ENCOUNTER — Ambulatory Visit: Payer: BC Managed Care – PPO | Admitting: Physician Assistant

## 2023-03-16 NOTE — Progress Notes (Deleted)
 Office Visit Note  Patient: Ryan Gibson             Date of Birth: 02/06/1976           MRN: [id number]             PCP: Cook, Jayce G, DO Referring: Cook, Jayce G, DO Visit Date: 03/30/2023 Occupation: @GUAROCC @  Subjective:  No chief complaint on file.   History of Present Illness: Ryan Gibson is a 48 y.o. male ***    IV Orencia  infusion every 4 weeks--next infusion scheduled on 02/08/23. Discontinue sq orencia  due to fatigue on weekly basis.  CBC and CMP updated on 12/14/22.  TB gold negative on 12/14/22.  Chest x-ray 02/16/2020. Not a good candidate for methotrexate or Arava due to alcohol use and elevated LFTs.  Not a good candidate for Jak inhibitors given history of PE and DVT.  Activities of Daily Living:  Patient reports morning stiffness for *** {minute/hour:19697}.   Patient {ACTIONS;DENIES/REPORTS:21021675::Denies} nocturnal pain.  Difficulty dressing/grooming: {ACTIONS;DENIES/REPORTS:21021675::Denies} Difficulty climbing stairs: {ACTIONS;DENIES/REPORTS:21021675::Denies} Difficulty getting out of chair: {ACTIONS;DENIES/REPORTS:21021675::Denies} Difficulty using hands for taps, buttons, cutlery, and/or writing: {ACTIONS;DENIES/REPORTS:21021675::Denies}  No Rheumatology ROS completed.   PMFS History:  Patient Active Problem List   Diagnosis Date Noted   Perforation of viscus 02/14/2023   Sinus tachycardia 02/14/2023   Acquired thrombophilia (HCC) 02/14/2023   Severe dehydration 02/14/2023   Generalized abdominal pain 02/14/2023   Leukocytosis 02/14/2023   Hyperbilirubinemia 02/14/2023   Severe sepsis (HCC) 02/14/2023   Erythrocytosis 02/07/2023   High risk medication use 12/05/2022   Screening for tuberculosis 12/05/2022   Anxiety 11/08/2022   Seronegative rheumatoid arthritis (HCC) 11/08/2022   History of pulmonary embolism 11/17/2021   Chronic systolic Gibson failure (HCC) 11/17/2021   Hyperlipidemia 06/16/2021   Dilated  cardiomyopathy (HCC) 12/16/2020   Family history of early CAD 12/16/2020   Hypogonadism male 11/24/2015   Gout 11/01/2014   Essential hypertension 03/10/2014    Past Medical History:  Diagnosis Date   Anxiety    Asthma    AS A CHILD ONLY   CHF (congestive Gibson failure) (HCC)    DVT (deep venous thrombosis) (HCC)    PE/DVT 2021   Erythrocytosis    Foot pain, right    painful x 1 month and swelling   HTN (hypertension)    Idiopathic gout     Family History  Problem Relation Age of Onset   Healthy Mother    Gibson disease Father    Colon cancer Neg Hx    Past Surgical History:  Procedure Laterality Date   HERNIA REPAIR  07/13/2011   [facility]   INGUINAL HERNIA REPAIR  07/13/2011   Procedure: HERNIA REPAIR INGUINAL ADULT;  Surgeon: Donnice POUR. Belinda, MD;  Location: [facility];  Service: General;  Laterality: Right;   LAPAROTOMY N/A 02/14/2023   Procedure: EXPLORATORY LAPAROTOMY, wash out, drain placement;  Surgeon: Kallie Manuelita BROCKS, MD;  Location: [facility];  Service: General;  Laterality: N/A;   Social History   Social History Narrative   Married for 13 years.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   Immunization History  Administered Date(s) Administered   Tdap 03/07/2018     Objective: Vital Signs: There were no vitals taken for this visit.   Physical Exam   Musculoskeletal Exam: ***  CDAI Exam: CDAI Score: -- Patient Global: --; Provider Global: -- Swollen: --; Tender: -- Joint Exam 03/30/2023   No joint exam has been documented for  this visit   There is currently no information documented on the homunculus. Go to the Rheumatology activity and complete the homunculus joint exam.  Investigation: No additional findings.  Imaging: US  EKG SITE RITE Result Date: 02/23/2023 If Site Rite image not attached, placement could not be confirmed due to current cardiac rhythm.  CT ABDOMEN PELVIS W CONTRAST Result Date: 02/23/2023 CLINICAL DATA:   Postoperative abdominal pain. EXAM: CT ABDOMEN AND PELVIS WITH CONTRAST TECHNIQUE: Multidetector CT imaging of the abdomen and pelvis was performed using the standard protocol following bolus administration of intravenous contrast. RADIATION DOSE REDUCTION: This exam was performed according to the departmental dose-optimization program which includes automated exposure control, adjustment of the mA and/or kV according to patient size and/or use of iterative reconstruction technique. CONTRAST:  OMNIPAQUE  IOHEXOL  300 MG/ML  SOLN COMPARISON:  February 14, 2023. FINDINGS: Lower chest: No acute abnormality. Hepatobiliary: No focal liver abnormality is seen. No gallstones, gallbladder wall thickening, or biliary dilatation. Pancreas: Unremarkable. No pancreatic ductal dilatation or surrounding inflammatory changes. Spleen: Normal in size without focal abnormality. Adrenals/Urinary Tract: Adrenal glands appear normal. Left renal cyst is noted for which no further follow-up is required. No hydronephrosis or renal obstruction is noted. Urinary bladder is decompressed. Stomach/Bowel: Nasogastric tube tip is seen in proximal stomach. Mildly dilated proximal small bowel loops are noted most likely representing ileus. No colonic dilatation is noted. 7.5 x 4.2 cm postoperative hematoma is seen inferior to the cecum. Vascular/Lymphatic: No significant vascular findings are present. No enlarged abdominal or pelvic lymph nodes. Reproductive: Prostate is unremarkable. Other: Surgical drain is seen involving the left anterior abdominal wall with distal tip underneath the left hepatic lobe. 7.5 x 4.0 cm hematoma is noted in the left rectus muscle just inferior to insertion of surgical drain. Midline surgical staples are noted. Musculoskeletal: No acute or significant osseous findings. IMPRESSION: Mildly dilated proximal small bowel loops are noted most likely representing postoperative ileus. 7.5 x 4.2 cm postoperative hematoma  is seen inferior to the cecum. Surgical drain is seen entering left anterior abdominal wall with distal tip underneath the left hepatic lobe. 7.5 x 4.0 cm hematoma is noted in the left rectus muscle just inferior to insertion of surgical drain. Electronically Signed   By: Lynwood Landy Raddle M.D.   On: 02/23/2023 11:36   DG Abd 1 View Result Date: 02/23/2023 CLINICAL DATA:  Ileus. EXAM: ABDOMEN - 1 VIEW COMPARISON:  Radiograph 02/20/2023 FINDINGS: Worsening gaseous small bowel distension centrally up to 5.5 cm. Enteric tube tip and side-port below the diaphragm in the stomach. A drain is seen in the upper abdomen. Midline skin staples. No significant formed colonic stool. Left pelvic phlebolith. IMPRESSION: 1. Worsening gaseous small bowel distension centrally, likely progressive postoperative ileus. 2. Enteric tube tip and side-port below the diaphragm in the stomach. Electronically Signed   By: Andrea Gasman M.D.   On: 02/23/2023 03:32   DG Abd 1 View Result Date: 02/20/2023 CLINICAL DATA:  Postoperative ileus and abdominal pain. EXAM: ABDOMEN - 1 VIEW COMPARISON:  February 19, 2023. FINDINGS: Surgical drains are noted in the epigastric and pelvic regions. Mildly dilated small bowel loops are noted suggesting postoperative ileus. No colonic dilatation is noted. IMPRESSION: Stable small bowel dilatation concerning for postoperative ileus. Electronically Signed   By: Lynwood Landy Raddle M.D.   On: 02/20/2023 13:13   DG Abd 1 View Result Date: 02/19/2023 CLINICAL DATA:  Abdominal pain and distention. Postop from exploratory laparotomy. EXAM: ABDOMEN - 1 VIEW  COMPARISON:  None Available. FINDINGS: Surgical drain seen in the upper abdomen and pelvis, as well as midline skin staples. Mild diffuse dilatation of small bowel loops is seen as well as mild gaseous distention of the transverse colon. This likely represents a postop ileus, with partial small bowel obstruction considered less likely. IMPRESSION: Probable  postop ileus, with partial small bowel obstruction considered less likely. Electronically Signed   By: Norleen DELENA Kil M.D.   On: 02/19/2023 13:04   DG Chest Port 1 View Result Date: 02/17/2023 CLINICAL DATA:  Nasogastric tube positioning. Recent exploratory laparotomy. EXAM: PORTABLE CHEST 1 VIEW COMPARISON:  02/14/2026 FINDINGS: Stable Gibson size. Nasogastric tube continues to extend into the stomach which shows some persistent gaseous distension. Correlation suggested with effectiveness of wall suction and patency of the tube itself. Additional surgical Penrose drain again visible in the upper abdomen. Stable elevation of the right hemidiaphragm. Slight increase in bibasilar atelectasis. No overt edema. No pneumothorax or significant pleural fluid. IMPRESSION: 1. Nasogastric tube continues to extend into the stomach which shows some persistent gaseous distension. Correlation suggested with effectiveness of wall suction and patency of the tube itself. 2. Slight increase in bibasilar atelectasis. Electronically Signed   By: Marcey Moan M.D.   On: 02/17/2023 16:43   DG CHEST PORT 1 VIEW Result Date: 02/15/2023 CLINICAL DATA:  Enteric tube placement EXAM: PORTABLE CHEST 1 VIEW COMPARISON:  Chest radiograph dated 02/14/2023 FINDINGS: Gastric/enteric tube tip projects over the stomach. Drainage catheter is seen projecting over the left upper quadrant. Low lung volumes with bronchovascular crowding. Bibasilar patchy opacities. Enlarged cardiomediastinal silhouette is likely projectional. No definite pneumothorax or pleural effusion. IMPRESSION: 1. Gastric/enteric tube tip projects over the stomach. 2. Low lung volumes with bronchovascular crowding. Bibasilar patchy opacities, likely atelectasis. Electronically Signed   By: Limin  Xu M.D.   On: 02/15/2023 20:06   DG Chest Port 1 View Result Date: 02/14/2023 CLINICAL DATA:  NG tube placement EXAM: PORTABLE CHEST 1 VIEW COMPARISON:  02/14/2023 FINDINGS: NG  tube tip is in the fundus of the stomach. Low lung volumes. Right base atelectasis. Left lung clear. Gibson is normal size. No effusions. IMPRESSION: NG tube tip in the stomach. Low lung volumes.  Right base atelectasis. Electronically Signed   By: Franky Crease M.D.   On: 02/14/2023 19:17   DG Chest Port 1 View Result Date: 02/14/2023 CLINICAL DATA:  Shortness of breath. EXAM: PORTABLE CHEST 1 VIEW COMPARISON:  02/16/2020. FINDINGS: Low lung volume. Mild pulmonary vascular congestion, which may be accentuated by low lung volume. No frank pulmonary edema. Bilateral lung fields are otherwise clear. No acute consolidation or lung collapse. Bilateral costophrenic angles are clear. Normal cardio-mediastinal silhouette. No acute osseous abnormalities. The soft tissues are within normal limits. IMPRESSION: *Mild diffuse pulmonary vascular congestion. Otherwise, no acute cardiopulmonary abnormality. Electronically Signed   By: Ree Molt M.D.   On: 02/14/2023 13:26   CT ABDOMEN PELVIS W CONTRAST Result Date: 02/14/2023 CLINICAL DATA:  Acute generalized abdominal pain. EXAM: CT ABDOMEN AND PELVIS WITH CONTRAST TECHNIQUE: Multidetector CT imaging of the abdomen and pelvis was performed using the standard protocol following bolus administration of intravenous contrast. RADIATION DOSE REDUCTION: This exam was performed according to the departmental dose-optimization program which includes automated exposure control, adjustment of the mA and/or kV according to patient size and/or use of iterative reconstruction technique. CONTRAST:  OMNIPAQUE  IOHEXOL  300 MG/ML  SOLN COMPARISON:  February 11, 2020. FINDINGS: Lower chest: No acute abnormality. Hepatobiliary: No focal liver  abnormality is seen. No gallstones, gallbladder wall thickening, or biliary dilatation. Pancreas: Unremarkable. No pancreatic ductal dilatation or surrounding inflammatory changes. Spleen: Normal in size without focal abnormality.  Adrenals/Urinary Tract: Adrenal glands appear normal. Stable left renal cyst is noted for which no further follow-up is required. No hydronephrosis or renal obstruction is noted. Urinary bladder is unremarkable. Stomach/Bowel: The stomach is unremarkable. The appendix appears normal. Mild amount of free air is noted in the epigastric region and in the pelvis consistent with rupture of hollow viscus. Mildly dilated and thick walled small bowel loops are noted in the pelvis and lower abdomen suggesting enteritis. There also appears to be mild sigmoid wall thickening suggesting possible diverticulitis. There is noted air between a small bowel loop and sigmoid colon, and therefore the source of perforation is not clearly identifiable. Vascular/Lymphatic: No significant vascular findings are present. No enlarged abdominal or pelvic lymph nodes. Reproductive: Prostate is unremarkable. Other: No ascites or hernia is noted. Musculoskeletal: No acute or significant osseous findings. IMPRESSION: Pneumoperitoneum is noted both in the epigastric and pelvic regions concerning for rupture of hollow viscus. Mildly dilated and inflamed small bowel loops are noted in lower abdomen and pelvis as well as mild sigmoid wall thickening suggesting possible inflammation there is well. It is uncertain if the pneumoperitoneum is secondary to ruptured small or large bowel. Critical Value/emergent results were called by telephone at the time of interpretation on 02/14/2023 at 1:22 pm to provider JOSEPH ZAMMIT , who verbally acknowledged these results. Electronically Signed   By: Lynwood Landy Raddle M.D.   On: 02/14/2023 13:24    Recent Labs: Lab Results  Component Value Date   WBC 6.5 02/26/2023   HGB 9.4 (L) 02/26/2023   PLT 306 02/26/2023   NA 136 02/25/2023   K 3.7 02/25/2023   CL 99 02/25/2023   CO2 29 02/25/2023   GLUCOSE 123 (H) 02/25/2023   BUN 7 02/25/2023   CREATININE 0.78 02/25/2023   BILITOT 1.5 (H) 02/25/2023   ALKPHOS  38 02/25/2023   AST 16 02/25/2023   ALT 17 02/25/2023   PROT 6.5 02/25/2023   ALBUMIN 2.8 (L) 02/25/2023   CALCIUM  8.4 (L) 02/25/2023   GFRAA 96 02/10/2020   QFTBGOLDPLUS NEGATIVE 12/14/2022    Speciality Comments: No specialty comments available.  Procedures:  No procedures performed Allergies: Vancomycin    Assessment / Plan:     Visit Diagnoses: Seronegative rheumatoid arthritis (HCC)  High risk medication use  Idiopathic chronic gout of multiple sites without tophus  Positive ANA (antinuclear antibody)  Pain in both wrists  Chronic pain of left knee  Effusion, right knee  Chronic pain of both ankles  Pain in both feet  Achilles tendinitis of left lower extremity  History of pulmonary embolism  Depression, major, single episode, moderate (HCC)  Chronic systolic Gibson failure (HCC)  Dilated cardiomyopathy (HCC)  Essential hypertension  History of hyperlipidemia  Hypogonadism male  Vitamin D  deficiency  Other fatigue  Polycythemia  Orders: No orders of the defined types were placed in this encounter.  No orders of the defined types were placed in this encounter.   Face-to-face time spent with patient was *** minutes. Greater than 50% of time was spent in counseling and coordination of care.  Follow-Up Instructions: No follow-ups on file.   Waddell CHRISTELLA Craze, PA-C  Note - This record has been created using Dragon software.  Chart creation errors have been sought, but may not always  have been located. Such creation errors  do not reflect on  the standard of medical care.

## 2023-03-19 NOTE — Progress Notes (Unsigned)
   Virtual Visit via Telephone Note   Because of Rock Sobol Massman's co-morbid illnesses, he is at least at moderate risk for complications without adequate follow up.  This format is felt to be most appropriate for this patient at this time.  The patient did not have access to video technology/had technical difficulties with video requiring transitioning to audio format only (telephone).  All issues noted in this document were discussed and addressed.  No physical exam could be performed with this format.  Please refer to the patient's chart for his consent to telehealth for [facility].  Evaluation Performed:  Preoperative cardiovascular risk assessment _____________   Date:  03/19/2023   Patient ID:  Ryan Gibson, DOB 1975-08-02, MRN [id number] Patient Location:  Home Provider location:   Office  Primary Care Provider:  Tommie Sams, DO Primary Cardiologist:  Yates Decamp, MD  Chief Complaint / Patient Profile   48 y.o. 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 Marletta Lor on TBD (type of anesthesia unknown) and presents today for telephonic preoperative cardiovascular risk assessment. His chart was reviewed by our PharmD team who recommended per office protocol, patient can hold Eliquis for 2 days prior to procedure. Patient will not need bridging with Lovenox (enoxaparin) around procedure.    History of Present Illness    Ryan Gibson is a 48 y.o. male who presents via audio/video conferencing for a telehealth visit today.  Pt was last seen in cardiology clinic on 01/24/23 by Jari Favre, PA-C.  At that time  Ryan Gibson was short of breath and a follow up echocardiogram showed improved LVF.  The patient is now pending procedure as outlined above. Since his last visit, he was admitted in 01/2023 for perforated viscus. He underwent ex lap. Since then, he is doing well w/o chest pain, shortness of breath.     Physical Exam    Vital Signs:  Ryan Gibson does not have vital signs available for review today.   Given telephonic nature of communication, physical exam is limited. AAOx3. NAD. Normal affect.  Speech and respirations are unlabored.  Accessory Clinical Findings    None    Assessment & Plan    Assessment & Plan Preoperative cardiovascular examination Ryan Gibson's perioperative risk of a major cardiac event is 0.9% according to the Revised Cardiac Risk Index (RCRI).  Therefore, he is at low risk for perioperative complications.   His functional capacity is good at 5.07 METs according to the Duke Activity Status Index (DASI). Recommendations: According to ACC/AHA guidelines, no further cardiovascular testing needed.  The patient may proceed to surgery at acceptable risk.   Antiplatelet and/or Anticoagulation Recommendations: Eliquis (Apixaban) can be held for 2 days prior to surgery.  Please resume post op when felt to be safe.    A copy of this note will be routed to requesting surgeon.  Time:   Today, I have spent 5 minutes with the patient with telehealth technology discussing medical history, symptoms, and management plan.     Tereso Newcomer, PA-C  03/19/2023, 10:37 PM

## 2023-03-20 ENCOUNTER — Encounter: Payer: Self-pay | Admitting: Physician Assistant

## 2023-03-20 ENCOUNTER — Ambulatory Visit: Payer: BC Managed Care – PPO | Attending: Physician Assistant | Admitting: Physician Assistant

## 2023-03-20 ENCOUNTER — Telehealth: Payer: Self-pay | Admitting: Internal Medicine

## 2023-03-20 DIAGNOSIS — Z01810 Encounter for preprocedural cardiovascular examination: Secondary | ICD-10-CM

## 2023-03-20 NOTE — Telephone Encounter (Signed)
Pt LMOM that he was calling to schedule his EGD/TCS. [phone]

## 2023-03-20 NOTE — Telephone Encounter (Signed)
See preop note from today

## 2023-03-20 NOTE — Telephone Encounter (Signed)
Pre-op note sent to [facility]

## 2023-03-21 ENCOUNTER — Ambulatory Visit: Payer: BC Managed Care – PPO | Admitting: Family Medicine

## 2023-03-21 DIAGNOSIS — E785 Hyperlipidemia, unspecified: Secondary | ICD-10-CM | POA: Diagnosis not present

## 2023-03-21 DIAGNOSIS — I10 Essential (primary) hypertension: Secondary | ICD-10-CM | POA: Diagnosis not present

## 2023-03-21 DIAGNOSIS — D751 Secondary polycythemia: Secondary | ICD-10-CM | POA: Diagnosis not present

## 2023-03-21 MED ORDER — NA SULFATE-K SULFATE-MG SULF 17.5-3.13-1.6 GM/177ML PO SOLN: 1.0000 | ORAL | 0 refills | Status: DC

## 2023-03-21 NOTE — Addendum Note (Signed)
Addended by: Armstead Peaks on: 03/21/2023 09:38 AM   Modules accepted: Orders

## 2023-03-22 ENCOUNTER — Ambulatory Visit (INDEPENDENT_AMBULATORY_CARE_PROVIDER_SITE_OTHER): Payer: BC Managed Care – PPO | Admitting: Family Medicine

## 2023-03-22 ENCOUNTER — Encounter: Payer: Self-pay | Admitting: Family Medicine

## 2023-03-22 ENCOUNTER — Ambulatory Visit: Payer: BC Managed Care – PPO | Admitting: Pharmacist

## 2023-03-22 VITALS — BP 118/83 | HR 84 | Temp 97.2°F | Ht 74.0 in | Wt 227.0 lb

## 2023-03-22 DIAGNOSIS — I5022 Chronic systolic (congestive) heart failure: Secondary | ICD-10-CM | POA: Diagnosis not present

## 2023-03-22 DIAGNOSIS — E785 Hyperlipidemia, unspecified: Secondary | ICD-10-CM

## 2023-03-22 DIAGNOSIS — R42 Dizziness and giddiness: Secondary | ICD-10-CM | POA: Diagnosis not present

## 2023-03-22 DIAGNOSIS — I10 Essential (primary) hypertension: Secondary | ICD-10-CM

## 2023-03-22 LAB — IRON,TIBC AND FERRITIN PANEL
Ferritin: 1524 ng/mL — ABNORMAL HIGH (ref 30–400)
Iron Saturation: 50 % (ref 15–55)
Iron: 165 ug/dL (ref 38–169)
Total Iron Binding Capacity: 330 ug/dL (ref 250–450)
UIBC: 165 ug/dL (ref 111–343)

## 2023-03-22 LAB — CMP14+EGFR
ALT: 19 [IU]/L (ref 0–44)
AST: 18 [IU]/L (ref 0–40)
Albumin: 4.9 g/dL (ref 4.1–5.1)
Alkaline Phosphatase: 66 [IU]/L (ref 44–121)
BUN/Creatinine Ratio: 15 (ref 9–20)
BUN: 17 mg/dL (ref 6–24)
Bilirubin Total: 1.2 mg/dL (ref 0.0–1.2)
CO2: 31 mmol/L — ABNORMAL HIGH (ref 20–29)
Calcium: 10.3 mg/dL — ABNORMAL HIGH (ref 8.7–10.2)
Chloride: 92 mmol/L — ABNORMAL LOW (ref 96–106)
Creatinine, Ser: 1.15 mg/dL (ref 0.76–1.27)
Globulin, Total: 3.2 g/dL (ref 1.5–4.5)
Glucose: 93 mg/dL (ref 70–99)
Potassium: 4.1 mmol/L (ref 3.5–5.2)
Sodium: 141 mmol/L (ref 134–144)
Total Protein: 8.1 g/dL (ref 6.0–8.5)
eGFR: 79 mL/min/{1.73_m2} (ref >59)

## 2023-03-22 LAB — CBC WITH DIFFERENTIAL/PLATELET
Basophils Absolute: 0.1 10*3/uL (ref 0.0–0.2)
Basos: 1 %
EOS (ABSOLUTE): 0.1 10*3/uL (ref 0.0–0.4)
Eos: 1 %
Hematocrit: 44.5 % (ref 37.5–51.0)
Hemoglobin: 15 g/dL (ref 13.0–17.7)
Immature Grans (Abs): 0 10*3/uL (ref 0.0–0.1)
Immature Granulocytes: 0 %
Lymphocytes Absolute: 3.7 10*3/uL — ABNORMAL HIGH (ref 0.7–3.1)
Lymphs: 32 %
MCH: 33.4 pg — ABNORMAL HIGH (ref 26.6–33.0)
MCHC: 33.7 g/dL (ref 31.5–35.7)
MCV: 99 fL — ABNORMAL HIGH (ref 79–97)
Monocytes Absolute: 0.8 10*3/uL (ref 0.1–0.9)
Monocytes: 7 %
Neutrophils Absolute: 6.8 10*3/uL (ref 1.4–7.0)
Neutrophils: 59 %
Platelets: 390 10*3/uL (ref 150–450)
RBC: 4.49 x10E6/uL (ref 4.14–5.80)
RDW: 14.3 % (ref 11.6–15.4)
WBC: 11.5 10*3/uL — ABNORMAL HIGH (ref 3.4–10.8)

## 2023-03-22 LAB — LIPID PANEL
Chol/HDL Ratio: 5.7 {ratio} — ABNORMAL HIGH (ref 0.0–5.0)
Cholesterol, Total: 215 mg/dL — ABNORMAL HIGH (ref 100–199)
HDL: 38 mg/dL — ABNORMAL LOW (ref >39)
LDL Chol Calc (NIH): 125 mg/dL — ABNORMAL HIGH (ref 0–99)
Triglycerides: 295 mg/dL — ABNORMAL HIGH (ref 0–149)
VLDL Cholesterol Cal: 52 mg/dL — ABNORMAL HIGH (ref 5–40)

## 2023-03-22 LAB — MICROALBUMIN / CREATININE URINE RATIO
Creatinine, Urine: 29.3 mg/dL
Microalb/Creat Ratio: 13 mg/g{creat} (ref 0–29)
Microalbumin, Urine: 3.7 ug/mL

## 2023-03-22 MED ORDER — FUROSEMIDE 40 MG PO TABS: 40.0000 mg | ORAL_TABLET | ORAL | Status: DC

## 2023-03-22 NOTE — Progress Notes (Signed)
Subjective:  Patient ID: Ryan Gibson, male    DOB: 01/07/1976  Age: 48 y.o. MRN: [id number]  CC:   Chief Complaint  Patient presents with   Follow-up    1 month f/u    Dizziness    When standing up comes and goes.     HPI:  48 year old male with a complicated past medical history including hypertension, chronic systolic heart failure/dilated cardiomyopathy with recent EF of 50 to 55%, hypogonadism, seronegative rheumatoid arthritis, history of PE, erythrocytosis, gout, hyperlipidemia presents for follow-up.  Patient with recent hospital admission from 12/24 to 1/5.  Presented with abdominal pain and was found to have perforated viscus.  Underwent exploratory laparotomy and no appreciable perforation was seen.  There was a lot of purulent fluid in the abdomen.  Had prolonged postoperative ileus.  Slowly progressed during hospitalization was discharged home in stable condition.  He has followed up with general surgery and overall is doing well at this time.  He denies abdominal pain.  He has colonoscopy scheduled.  BP is stable.  Patient reports ongoing lightheadedness particular with standing over the past month.  He is currently on Jardiance, Lasix, metoprolol, Entresto, and spironolactone.  Labs from yesterday reveal creatinine within normal limits but significantly increased from his prior.  Concern for volume depletion in the setting of Lasix.  Patient Active Problem List   Diagnosis Date Noted   Dizziness 03/22/2023   Acquired thrombophilia (HCC) 02/14/2023   Erythrocytosis 02/07/2023   High risk medication use 12/05/2022   Anxiety 11/08/2022   Seronegative rheumatoid arthritis (HCC) 11/08/2022   History of pulmonary embolism 11/17/2021   Chronic systolic heart failure (HCC) 11/17/2021   Hyperlipidemia 06/16/2021   Dilated cardiomyopathy (HCC) 12/16/2020   Family history of early CAD 12/16/2020   Hypogonadism male 11/24/2015   Gout 11/01/2014   Essential  hypertension 03/10/2014    Social Hx   Social History   Socioeconomic History   Marital status: Married    Spouse name: Not on file   Number of children: 4   Years of education: Not on file   Highest education level: Associate degree: occupational, Scientist, product/process development, or vocational program  Occupational History   Not on file  Tobacco Use   Smoking status: Never    Passive exposure: Never   Smokeless tobacco: Current    Types: Chew  Vaping Use   Vaping status: Never Used  Substance and Sexual Activity   Alcohol use: Yes    Comment: Social, heavier alcohol consumption in his 20s/30s   Drug use: No   Sexual activity: Yes    Birth control/protection: None    Comment: Married  Other Topics Concern   Not on file  Social History Narrative   Married for 13 years.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   Social Drivers of Health   Financial Resource Strain: Medium Risk (02/07/2023)   Overall Financial Resource Strain (CARDIA)    Difficulty of Paying Living Expenses: Somewhat hard  Food Insecurity: No Food Insecurity (02/15/2023)   Hunger Vital Sign    Worried About Running Out of Food in the Last Year: Never true    Ran Out of Food in the Last Year: Never true  Transportation Needs: No Transportation Needs (02/15/2023)   PRAPARE - Administrator, Civil Service (Medical): No    Lack of Transportation (Non-Medical): No  Physical Activity: Insufficiently Active (02/07/2023)   Exercise Vital Sign    Days of Exercise per Week:  3 days    Minutes of Exercise per Session: 30 min  Stress: No Stress Concern Present (02/07/2023)   [employer] of Occupational Health - Occupational Stress Questionnaire    Feeling of Stress : Only a little  Social Connections: Socially Integrated (02/07/2023)   Social Connection and Isolation Panel [NHANES]    Frequency of Communication with Friends and Family: More than three times a week    Frequency of Social  Gatherings with Friends and Family: Once a week    Attends Religious Services: More than 4 times per year    Active Member of [employer] or Organizations: Yes    Attends Engineer, structural: More than 4 times per year    Marital Status: Married    Review of Systems Per HPI  Objective:  BP 118/83 (Patient Position: Standing)   Pulse 84   Temp (!) 97.2 F (36.2 C)   Ht 6\' 2"  (1.88 m)   Wt 227 lb (103 kg)   SpO2 100%   BMI 29.15 kg/m      03/22/2023    8:41 AM 03/22/2023    8:28 AM 02/28/2023   11:24 AM  BP/Weight  Systolic BP 118  960  Diastolic BP 83  89  Wt. (Lbs)  227 234  BMI  29.15 kg/m2 30.04 kg/m2    Physical Exam Vitals and nursing note reviewed.  Constitutional:      General: He is not in acute distress.    Appearance: Normal appearance.  HENT:     Head: Normocephalic and atraumatic.  Pulmonary:     Effort: Pulmonary effort is normal. No respiratory distress.  Abdominal:     General: There is no distension.     Palpations: Abdomen is soft.     Tenderness: There is no abdominal tenderness.  Neurological:     Mental Status: He is alert.  Psychiatric:        Mood and Affect: Mood normal.        Behavior: Behavior normal.     Lab Results  Component Value Date   WBC 11.5 (H) 03/21/2023   HGB 15.0 03/21/2023   HCT 44.5 03/21/2023   PLT 390 03/21/2023   GLUCOSE 93 03/21/2023   CHOL 215 (H) 03/21/2023   TRIG 295 (H) 03/21/2023   HDL 38 (L) 03/21/2023   LDLDIRECT 82 07/23/2020   LDLCALC 125 (H) 03/21/2023   ALT 19 03/21/2023   AST 18 03/21/2023   NA 141 03/21/2023   K 4.1 03/21/2023   CL 92 (L) 03/21/2023   CREATININE 1.15 03/21/2023   BUN 17 03/21/2023   CO2 31 (H) 03/21/2023   TSH 1.56 12/01/2022   INR 1.1 02/23/2023   HGBA1C 4.7 (L) 09/10/2022     Assessment & Plan:   Problem List Items Addressed This Visit       Cardiovascular and Mediastinum   Essential hypertension   BP stable.      Relevant Medications   furosemide  (LASIX) 40 MG tablet   Chronic systolic heart failure (HCC) - Primary   Patient with no evidence of volume overload at this time.  Suspect the dizziness is related to volume depletion.  Discussed with cardiology, Dr. Jacinto Halim.  Holding Lasix until Monday.  Then decreasing to 40 mg every other day.      Relevant Medications   furosemide (LASIX) 40 MG tablet     Other   Hyperlipidemia   Patient no longer on statin.  Needs treatment.  Will await  until patient's labs are all complete and then discuss treatment.      Relevant Medications   furosemide (LASIX) 40 MG tablet   Dizziness   Likely in the setting of volume depletion from Lasix.  Discussed with cardiology.  Holding Lasix until Monday.  Then patient is to take 40 mg every other day.       Meds ordered this encounter  Medications   furosemide (LASIX) 40 MG tablet    Sig: Take 1 tablet (40 mg total) by mouth every other day.    Follow-up:  1 month  Aleczander Fandino DO [facility]

## 2023-03-22 NOTE — Assessment & Plan Note (Signed)
Patient with no evidence of volume overload at this time.  Suspect the dizziness is related to volume depletion.  Discussed with cardiology, Dr. Jacinto Halim.  Holding Lasix until Monday.  Then decreasing to 40 mg every other day.

## 2023-03-22 NOTE — Assessment & Plan Note (Signed)
Likely in the setting of volume depletion from Lasix.  Discussed with cardiology.  Holding Lasix until Monday.  Then patient is to take 40 mg every other day.

## 2023-03-22 NOTE — Patient Instructions (Addendum)
Hold lasix. Restart Monday (take 40 every other day).  Follow up in 1 month.  Continue your other medications.

## 2023-03-22 NOTE — Assessment & Plan Note (Signed)
Patient no longer on statin.  Needs treatment.  Will await until patient's labs are all complete and then discuss treatment.

## 2023-03-22 NOTE — Assessment & Plan Note (Signed)
BP stable.

## 2023-03-23 ENCOUNTER — Encounter: Payer: Self-pay | Admitting: Family Medicine

## 2023-03-26 ENCOUNTER — Other Ambulatory Visit: Payer: Self-pay | Admitting: Family Medicine

## 2023-03-26 ENCOUNTER — Other Ambulatory Visit: Payer: Self-pay | Admitting: Physician Assistant

## 2023-03-26 MED ORDER — ATORVASTATIN CALCIUM 40 MG PO TABS: 40.0000 mg | ORAL_TABLET | Freq: Every day | ORAL | 3 refills | Status: DC

## 2023-03-30 ENCOUNTER — Ambulatory Visit: Payer: BC Managed Care – PPO | Admitting: Physician Assistant

## 2023-03-30 DIAGNOSIS — F321 Major depressive disorder, single episode, moderate: Secondary | ICD-10-CM

## 2023-03-30 DIAGNOSIS — M25461 Effusion, right knee: Secondary | ICD-10-CM

## 2023-03-30 DIAGNOSIS — I5022 Chronic systolic (congestive) heart failure: Secondary | ICD-10-CM

## 2023-03-30 DIAGNOSIS — D751 Secondary polycythemia: Secondary | ICD-10-CM

## 2023-03-30 DIAGNOSIS — M7662 Achilles tendinitis, left leg: Secondary | ICD-10-CM

## 2023-03-30 DIAGNOSIS — I10 Essential (primary) hypertension: Secondary | ICD-10-CM

## 2023-03-30 DIAGNOSIS — R768 Other specified abnormal immunological findings in serum: Secondary | ICD-10-CM

## 2023-03-30 DIAGNOSIS — Z8639 Personal history of other endocrine, nutritional and metabolic disease: Secondary | ICD-10-CM

## 2023-03-30 DIAGNOSIS — G8929 Other chronic pain: Secondary | ICD-10-CM

## 2023-03-30 DIAGNOSIS — M25531 Pain in right wrist: Secondary | ICD-10-CM

## 2023-03-30 DIAGNOSIS — Z86711 Personal history of pulmonary embolism: Secondary | ICD-10-CM

## 2023-03-30 DIAGNOSIS — E559 Vitamin D deficiency, unspecified: Secondary | ICD-10-CM

## 2023-03-30 DIAGNOSIS — Z79899 Other long term (current) drug therapy: Secondary | ICD-10-CM

## 2023-03-30 DIAGNOSIS — R5383 Other fatigue: Secondary | ICD-10-CM

## 2023-03-30 DIAGNOSIS — M0600 Rheumatoid arthritis without rheumatoid factor, unspecified site: Secondary | ICD-10-CM

## 2023-03-30 DIAGNOSIS — M1A09X0 Idiopathic chronic gout, multiple sites, without tophus (tophi): Secondary | ICD-10-CM

## 2023-03-30 DIAGNOSIS — E291 Testicular hypofunction: Secondary | ICD-10-CM

## 2023-03-30 DIAGNOSIS — I420 Dilated cardiomyopathy: Secondary | ICD-10-CM

## 2023-03-30 DIAGNOSIS — M79671 Pain in right foot: Secondary | ICD-10-CM

## 2023-03-30 NOTE — Telephone Encounter (Signed)
 Pt r/s appt to 05/23/23

## 2023-04-02 ENCOUNTER — Other Ambulatory Visit: Payer: Self-pay | Admitting: Rheumatology

## 2023-04-02 ENCOUNTER — Other Ambulatory Visit: Payer: Self-pay | Admitting: Physician Assistant

## 2023-04-02 DIAGNOSIS — M1A09X0 Idiopathic chronic gout, multiple sites, without tophus (tophi): Secondary | ICD-10-CM

## 2023-04-04 ENCOUNTER — Encounter: Payer: Self-pay | Admitting: General Surgery

## 2023-04-04 ENCOUNTER — Ambulatory Visit (INDEPENDENT_AMBULATORY_CARE_PROVIDER_SITE_OTHER): Payer: BC Managed Care – PPO | Admitting: General Surgery

## 2023-04-04 VITALS — BP 113/79 | HR 83 | Temp 97.6°F | Resp 12 | Ht 74.0 in | Wt 230.0 lb

## 2023-04-04 DIAGNOSIS — R198 Other specified symptoms and signs involving the digestive system and abdomen: Secondary | ICD-10-CM

## 2023-04-04 NOTE — Progress Notes (Signed)
[facility]  Doing well and eating and back to normal activities. No complaints. Saw GI and getting endoscopy and colonoscopy in March.  BP 113/79   Pulse 83   Temp 97.6 F (36.4 C) (Oral)   Resp 12   Ht 6\' 2"  (1.88 m)   Wt 230 lb (104.3 kg)   SpO2 97%   BMI 29.53 kg/m   SOFT, NONDISTENDED, NONTENDER, MIDLINE HEALED, NO HERNIA   Patient s/p Exlap, washout for perforated viscous, no source found, grew E coli. My best guess is this was diverticulitis perforation that sealed and left with purulent ascites.   Dr. Marletta Lor will do a colonoscopy and endoscopy in March to look at everything more closely. Can start back lifting and your regular activity as tolerated.   Things we know about diverticulitis:  AVOID/limit RED MEAT this can cause flares.  AVOID NSAIDS, IBUPROFEN, ALEVE, ASPIRIN WHILE YOU HAVE AN ACTIVE FLARE.  EXERCISE AS WELL AS SMOKING CESSATION CAN HELP PREVENT RECURRENCES.   Diverticulosis/ Diverticulitis Information and Diet:   Diverticulosis is a condition in which small, bulging pouches (diverticuli) form inside the lower part of the intestine, usually in the colon. Constipation and straining during bowel movements can worsen the condition. A diet rich in fiber can help keep stools soft and prevent inflammation.  Diverticulitis occurs when the pouches in the colon become infected or inflamed. Dietary changes can help the colon heal.  Fiber is an important part of the diet for patients with diverticulosis. A high-fiber diet softens and gives bulk to the stool, allowing it to pass quickly and easily.  Diet for Diverticulosis (NOW) Eat a high-fiber diet when you have diverticulosis. Fiber softens the stool and helps prevent constipation. It also can help decrease pressure in the colon and help prevent flare-ups of diverticulitis.  High-fiber foods include:  Beans and legumes Bran, whole wheat bread and whole grain cereals such as oatmeal Brown and  wild rice Fruits such as apples, bananas and pears Vegetables such as broccoli, carrots, corn and squash Whole wheat pasta If you currently don't have a diet high in fiber, you should add fiber gradually. This helps avoid bloating and abdominal discomfort. The target is to eat 25 to 30 grams of fiber daily. Drink at least 8 cups of fluid daily. Fluid will help soften your stool. Exercise also promotes bowel movement and helps prevent constipation.  When the colon is not inflamed, eat popcorn, nuts and seeds as tolerated.  Diet for Diverticulitis (WHEN YOU FLARE/ HAVE PAIN) During flare ups of diverticulitis, follow a clear liquid diet. Your doctor will let you know when to progress from clear liquids to low fiber solids and then back to your normal diet.  A clear liquid diet means no solid foods. Juices should have no pulp. During the clear liquid diet, you may consume:  Broth Clear juices such as apple, cranberry and grape. (Avoid orange juice) Jell-O Popsicles  When you're able to eat solid food, choose low fiber foods while healing. Low fiber foods include:  Canned or cooked fruit without seeds or skin, such as applesauce and melon Canned or well cooked vegetables without seeds and skin Dairy products such as cheese, milk and yogurt Eggs Low-fiber cereal Meat that is ground or tender and well cooked Pasta White bread and white rice AVOID RED MEAT WHILE YOU HAVE AN ACTIVE FLARE.  AVOID NSAIDS, IBUPROFEN, ALEVE, ASPIRIN WHILE YOU HAVE AN ACTIVE FLARE.  EXERCISE AS WELL AS SMOKING CESSATION CAN HELP  PREVENT RECURRENCES.   After symptoms improve, usually within two to four days, you may add 5 to 15 grams of fiber a day back into your diet. Resume your high fiber diet when you no longer have symptoms.   PRN Follow up  Algis Greenhouse, MD [facility] [address] [place], [place] [address] [phone] (office)'

## 2023-04-04 NOTE — Patient Instructions (Addendum)
My best guess is this was from diverticulitis.  Dr. Marletta Lor will do a colonoscopy and endoscopy in March to look at everything more closely. Can start back lifting and your regular activity as tolerated.   Things we know about diverticulitis:  AVOID/limit RED MEAT this can cause flares.  AVOID NSAIDS, IBUPROFEN, ALEVE, ASPIRIN WHILE YOU HAVE AN ACTIVE FLARE.  EXERCISE AS WELL AS SMOKING CESSATION CAN HELP PREVENT RECURRENCES.   Diverticulosis/ Diverticulitis Information and Diet:   Diverticulosis is a condition in which small, bulging pouches (diverticuli) form inside the lower part of the intestine, usually in the colon. Constipation and straining during bowel movements can worsen the condition. A diet rich in fiber can help keep stools soft and prevent inflammation.  Diverticulitis occurs when the pouches in the colon become infected or inflamed. Dietary changes can help the colon heal.  Fiber is an important part of the diet for patients with diverticulosis. A high-fiber diet softens and gives bulk to the stool, allowing it to pass quickly and easily.  Diet for Diverticulosis (NOW) Eat a high-fiber diet when you have diverticulosis. Fiber softens the stool and helps prevent constipation. It also can help decrease pressure in the colon and help prevent flare-ups of diverticulitis.  High-fiber foods include:  Beans and legumes Bran, whole wheat bread and whole grain cereals such as oatmeal Brown and wild rice Fruits such as apples, bananas and pears Vegetables such as broccoli, carrots, corn and squash Whole wheat pasta If you currently don't have a diet high in fiber, you should add fiber gradually. This helps avoid bloating and abdominal discomfort. The target is to eat 25 to 30 grams of fiber daily. Drink at least 8 cups of fluid daily. Fluid will help soften your stool. Exercise also promotes bowel movement and helps prevent constipation.  When the colon is not inflamed, eat  popcorn, nuts and seeds as tolerated.  Diet for Diverticulitis (WHEN YOU FLARE/ HAVE PAIN) During flare ups of diverticulitis, follow a clear liquid diet. Your doctor will let you know when to progress from clear liquids to low fiber solids and then back to your normal diet.  A clear liquid diet means no solid foods. Juices should have no pulp. During the clear liquid diet, you may consume:  Broth Clear juices such as apple, cranberry and grape. (Avoid orange juice) Jell-O Popsicles  When you're able to eat solid food, choose low fiber foods while healing. Low fiber foods include:  Canned or cooked fruit without seeds or skin, such as applesauce and melon Canned or well cooked vegetables without seeds and skin Dairy products such as cheese, milk and yogurt Eggs Low-fiber cereal Meat that is ground or tender and well cooked Pasta White bread and white rice AVOID RED MEAT WHILE YOU HAVE AN ACTIVE FLARE.  AVOID NSAIDS, IBUPROFEN, ALEVE, ASPIRIN WHILE YOU HAVE AN ACTIVE FLARE.  EXERCISE AS WELL AS SMOKING CESSATION CAN HELP PREVENT RECURRENCES.   After symptoms improve, usually within two to four days, you may add 5 to 15 grams of fiber a day back into your diet. Resume your high fiber diet when you no longer have symptoms.

## 2023-04-12 ENCOUNTER — Encounter (HOSPITAL_COMMUNITY): Payer: Self-pay

## 2023-04-14 NOTE — Progress Notes (Signed)
 Cardiology Office Note:  .   Date:  04/14/2023  ID:  Ryan Gibson, DOB [birth year], MRN [id number] PCP: Tommie Sams, DO  [facility] Providers Cardiologist:  Yates Decamp, MD {   History of Present Illness: .   Ryan Gibson is a 48 y.o. male with a past medical history of hypertension, tobacco use disorder, alcohol abuse, strong family history of premature coronary artery disease his father had coronary artery disease in his 86s here for follow-up appointment.  Patient presented to [facility] in 01/2020 where he was diagnosed with acute bilateral pulmonary emboli, DVT with right lower extremity and severe dilated cardiomyopathy with LVEF less than 20% as well as acute cholecystitis.  Suspected DVT and PE are related to underlying COVID-19 infection.  Cardiomyopathy likely related to alcohol abuse.  Patient wants initiated on guideline directed medical therapy including Jardiance, metoprolol, and Entresto.  Since admission 12/21 patient has reduced alcohol intake and echocardiogram was repeated 06/2021 which revealed LVEF improved to 40 to 45%.  Patient was seen in the office 03/23/2021 at which time spironolactone 12.5 mg was added.  Patient subsequently increased mental acting on to 25 mg daily.  Currently taking furosemide 40 mg daily.  Had recent labs done and BMP remained stable.  Patient is overall feeling very well without any specific complaints.  Denies chest pain, orthopnea, dyspnea, leg edema.  Continue to reduce alcohol intake and was drinking 1-2 beers per week.  This was 08/04/2021.  He was seen by me 11/23/22, he tells me that he turned in paperwork for patient assistance a year ago with Dr. Verl Dicker office and never heard anything back.  He has been living off of samples since January and he is intermittently been taking his medications.  Today blood pressure is 178/120.  I told him that I should be sending him to the ER but he refused to go.  Out of all his medications.  Did  not take anything this morning.  Heart rate is in the 90s.  We sent refills of Lasix, metoprolol, and spironolactone today and we have given him coupons for his Eliquis, Jardiance, and Entresto.  Unfortunately, we do not have any samples available in the office of these medications.  I reach out to social work to follow-up with him.  He stopped taking his Crestor because it "made him feel bad".  He is now doing testosterone supplementation for fatigue.  However, we discussed that his fatigue could very well be multifactorial.  He is on prednisone for his rheumatoid arthritis  I also sent in a prescription for valsartan 160 mg daily just in case he is unable to obtain Entresto in the next few days.  Obviously, would prefer Entresto but patient does need blood pressure medication at all times.  This was emphasized to him.  I saw him 01/24/23,  he presents with a history of heart disease and rheumatoid arthritis with a flare of rheumatoid arthritis, experiencing severe pain in the knee and wrist. The patient also reports a sensation of his heart "swelling" and then "taking off again," a symptom he previously experienced when his heart function was significantly reduced. The patient has been self-adjusting his dose of Lasix, a diuretic medication, due to perceived lack of efficacy at the prescribed dose. The patient has also been undergoing evaluation by an oncologist for potential re-diagnosis with Stills disease.   Reports no chest pain, pressure, or tightness. No edema, orthopnea, PND.   Discussed the use of AI  scribe software for clinical note transcription with the patient, who gave verbal consent to proceed.  Today, he presents with a history of diverticulitis  with dizziness that started after an episode of diverticulitis. The dizziness is described as a head rush, with blurry vision, and a feeling of passing out. The episodes occur several times a day, regardless of the patient's position - sitting,  standing, or lying down. The patient has tried stopping spironolactone, but it made no difference. The patient does not have a blood pressure monitor at home to check his blood pressure during these episodes. The patient also mentions having a colonoscopy scheduled for diverticulitis and a possible echo for his heart. The patient also discusses starting Repatha for high LDL cholesterol levels.  Reports no shortness of breath nor dyspnea on exertion. Reports no chest pain, pressure, or tightness. No edema, orthopnea, PND. Reports no palpitations.    Discussed the use of AI scribe software for clinical note transcription with the patient, who gave verbal consent to proceed.  ROS: Pertinent ROS in HPI  Studies Reviewed: .        Echocardiogram 03/01/2022:  Left ventricle cavity is mildly dilated. Normal left ventricular wall  thickness. Hypokinetic global wall motion. Abnormal septal wall motion due  to left bundle branch block. Normal diastolic filling pattern. Moderately  depressed LV systolic function with visual EF 35-40%. Calculated EF 43%.  The aortic root is mildly dilated at 4.2 cm at sinus of valsalva.  Ascending aorta is normal in size. .  Compared to the study done on 07/13/2021, no significant change, mild  aortic root dilatation at sinus of Valsalva is new.       Physical Exam:   VS:  There were no vitals taken for this visit.   Wt Readings from Last 3 Encounters:  04/04/23 230 lb (104.3 kg)  03/22/23 227 lb (103 kg)  02/28/23 234 lb (106.1 kg)    GEN: Well nourished, well developed in no acute distress NECK: No JVD; No carotid bruits CARDIAC: RRR, no murmurs, rubs, gallops RESPIRATORY:  Clear to auscultation without rales, wheezing or rhonchi  ABDOMEN: Soft, non-tender, non-distended EXTREMITIES:  No edema; No deformity   ASSESSMENT AND PLAN: .    Dizziness/Presyncope Frequent episodes of dizziness, possibly orthostatic hypotension or inner ear issue. No improvement  with cessation of Spironolactone. -Obtain home blood pressure monitor and record blood pressure during episodes of dizziness.  No palpitations or fast heartbeat during this time. -Discuss findings with primary care provider during upcoming appointment. -check carotid US -Blood pressure stable today  Hyperlipidemia Elevated LDL despite not yet starting statin therapy due to previous intolerance. Family history of coronary disease. -Initiate prior authorization process for Repatha. -Discuss with primary care provider during upcoming appointment.  Colonoscopy Scheduled for evaluation of previous diverticulitis episode. -Cleared for procedure by cardiology. -Continue current plan for colonoscopy.  Chronic systolic Heart Failure Patient reports feeling unwell, with symptoms similar to when ejection fraction was around 20%. Currently on Entresto, Jardiance, Spironolactone, and Lasix 40mg  every other day -Euvolemic on exam today  Hypertension Well-controlled today.  Continue current medication regimen      Dispo: He an follow-up in 2-3 months with APP   Signed, Sharlene Dory, PA-C

## 2023-04-18 ENCOUNTER — Ambulatory Visit: Payer: BC Managed Care – PPO | Attending: Physician Assistant | Admitting: Physician Assistant

## 2023-04-18 ENCOUNTER — Encounter: Payer: Self-pay | Admitting: Physician Assistant

## 2023-04-18 VITALS — BP 128/80 | HR 77 | Ht 74.0 in | Wt 232.0 lb

## 2023-04-18 DIAGNOSIS — E782 Mixed hyperlipidemia: Secondary | ICD-10-CM

## 2023-04-18 DIAGNOSIS — I2699 Other pulmonary embolism without acute cor pulmonale: Secondary | ICD-10-CM

## 2023-04-18 DIAGNOSIS — M0600 Rheumatoid arthritis without rheumatoid factor, unspecified site: Secondary | ICD-10-CM

## 2023-04-18 DIAGNOSIS — R609 Edema, unspecified: Secondary | ICD-10-CM

## 2023-04-18 DIAGNOSIS — R42 Dizziness and giddiness: Secondary | ICD-10-CM

## 2023-04-18 DIAGNOSIS — I5022 Chronic systolic (congestive) heart failure: Secondary | ICD-10-CM

## 2023-04-18 DIAGNOSIS — I10 Essential (primary) hypertension: Secondary | ICD-10-CM

## 2023-04-18 DIAGNOSIS — R55 Syncope and collapse: Secondary | ICD-10-CM

## 2023-04-18 NOTE — Patient Instructions (Signed)
 Medication Instructions:   *If you need a refill on your cardiac medications before your next appointment, please call your pharmacy*   Lab Work:  If you have labs (blood work) drawn today and your tests are completely normal, you will receive your results only by: MyChart Message (if you have MyChart) OR A paper copy in the mail If you have any lab test that is abnormal or we need to change your treatment, we will call you to review the results.   Testing/Procedures:    Follow-Up: At [facility], you and your health needs are our priority.  As part of our continuing mission to provide you with exceptional heart care, we have created designated Provider Care Teams.  These Care Teams include your primary Cardiologist (physician) and Advanced Practice Providers (APPs -  Physician Assistants and Nurse Practitioners) who all work together to provide you with the care you need, when you need it.  We recommend signing up for the patient portal called "MyChart".  Sign up information is provided on this After Visit Summary.  MyChart is used to connect with patients for Virtual Visits (Telemedicine).  Patients are able to view lab/test results, encounter notes, upcoming appointments, etc.  Non-urgent messages can be sent to your provider as well.   To learn more about what you can do with MyChart, go to [email or web address].    Your next appointment:   6 month(s)  Provider:   Yates Decamp, MD     Other Instructions CHECK YOUR BP AND KEEP A LOG

## 2023-04-18 NOTE — Telephone Encounter (Signed)
 Left a message and sent a My Chart to the pt to alert him that we do not need to get a CAROTID US.... Ryan Gibson has placed the order.

## 2023-04-19 ENCOUNTER — Encounter: Payer: Self-pay | Admitting: Physical Medicine & Rehabilitation

## 2023-04-19 ENCOUNTER — Ambulatory Visit (INDEPENDENT_AMBULATORY_CARE_PROVIDER_SITE_OTHER): Payer: BC Managed Care – PPO | Admitting: Family Medicine

## 2023-04-19 ENCOUNTER — Encounter
Payer: BC Managed Care – PPO | Attending: Physical Medicine & Rehabilitation | Admitting: Physical Medicine & Rehabilitation

## 2023-04-19 VITALS — BP 128/89 | HR 85 | Ht 74.0 in | Wt 233.8 lb

## 2023-04-19 DIAGNOSIS — M0600 Rheumatoid arthritis without rheumatoid factor, unspecified site: Secondary | ICD-10-CM | POA: Insufficient documentation

## 2023-04-19 DIAGNOSIS — Z79891 Long term (current) use of opiate analgesic: Secondary | ICD-10-CM | POA: Diagnosis not present

## 2023-04-19 DIAGNOSIS — G894 Chronic pain syndrome: Secondary | ICD-10-CM | POA: Diagnosis not present

## 2023-04-19 DIAGNOSIS — I10 Essential (primary) hypertension: Secondary | ICD-10-CM | POA: Diagnosis not present

## 2023-04-19 DIAGNOSIS — F419 Anxiety disorder, unspecified: Secondary | ICD-10-CM | POA: Diagnosis not present

## 2023-04-19 DIAGNOSIS — M1009 Idiopathic gout, multiple sites: Secondary | ICD-10-CM | POA: Diagnosis not present

## 2023-04-19 DIAGNOSIS — E291 Testicular hypofunction: Secondary | ICD-10-CM

## 2023-04-19 DIAGNOSIS — Z5181 Encounter for therapeutic drug level monitoring: Secondary | ICD-10-CM | POA: Diagnosis not present

## 2023-04-19 MED ORDER — ALPRAZOLAM 0.5 MG PO TABS: 0.5000 mg | ORAL_TABLET | Freq: Two times a day (BID) | ORAL | 1 refills | Status: DC | PRN

## 2023-04-19 NOTE — Patient Instructions (Signed)
 No lasix. No testosterone.   Xanax as prescribed.  I am happy to do the FMLA.  Follow up in 3 - 6 months.

## 2023-04-19 NOTE — Progress Notes (Signed)
 Subjective:    Patient ID: Ryan Gibson, male    DOB: 09-14-1975, 48 y.o.   MRN: [id number]  HPI  This is an initial office visit for Ryan Gibson who is a  pleasant 48 year old male with a history of chronic systolic heart failure, hypogonadism, seronegative rheumatoid arthritis, history of gout and PE who was recently admitted at the end of last year for ? perforated viscus although none was found during ex-lap.   He is here primarily for pain related to gout and his seronegative RA. He was diagnosed a few years ago without and RA was diagnosed about a year ago by Rheumatology. He has been on numerous medications  Including allopurinol, uloric, and colchicine, infusions. He is currently on colchicine only. Uloric wasn't really helping. He has two or 3 flares a month where he's laying in bed 2-3 days until he can get up.   He has never taken anything for pain relief other than steroids. He took these last in December. He last had a flare at the beginning of this month which was fairly mild. He is typically most affected in his feet, knees, wrist, elbows, fingers.   He empties his bowels regularly. Bladder emptying is fine.   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  He is on his feet a lot and the job obviously requires some physicality       Pain Inventory Average Pain 3 Pain Right Now 0 My pain is intermittent, sharp, burning, and aching  In the last 24 hours, has pain interfered with the following? General activity 10 Relation with others 10 Enjoyment of life 10 What TIME of day is your pain at its worst? morning , daytime, evening, and night Sleep (in general) Good  Pain is worse with: walking, bending, sitting, inactivity, standing, and some activites Pain improves with: medication Relief from Meds: 6  walk without assistance ability to climb  steps?  yes do you drive?  yes  employed # of hrs/week 40 what is your job? [employer]   No problems in this area  when having a flare has trouble walking  Any changes since last visit?  no  Primary care Everlene Other MD Rheumatologist Shali Devashwar    Family History  Problem Relation Age of Onset   Healthy Mother    Heart disease Father    Colon cancer Neg Hx    Social History   Socioeconomic History   Marital status: Married    Spouse name: Not on file   Number of children: 4   Years of education: Not on file   Highest education level: Associate degree: occupational, Scientist, product/process development, or vocational program  Occupational History   Not on file  Tobacco Use   Smoking status: Never    Passive exposure: Never   Smokeless tobacco: Current    Types: Chew  Vaping Use   Vaping status: Never Used  Substance and Sexual Activity   Alcohol use: Yes    Comment: Social, heavier alcohol consumption in his 20s/30s   Drug use: No   Sexual activity: Yes    Birth control/protection: None    Comment: Married  Other Topics Concern   Not on file  Social History Narrative   Married for 13 years.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   Social Drivers of Corporate investment banker Strain:  Medium Risk (02/07/2023)   Overall Financial Resource Strain (CARDIA)    Difficulty of Paying Living Expenses: Somewhat hard  Food Insecurity: No Food Insecurity (02/15/2023)   Hunger Vital Sign    Worried About Running Out of Food in the Last Year: Never true    Ran Out of Food in the Last Year: Never true  Transportation Needs: No Transportation Needs (02/15/2023)   PRAPARE - Administrator, Civil Service (Medical): No    Lack of Transportation (Non-Medical): No  Physical Activity: Insufficiently Active (02/07/2023)   Exercise Vital Sign    Days of Exercise per Week: 3 days    Minutes of Exercise per Session: 30 min  Stress: No Stress Concern Present  (02/07/2023)   [employer] of Occupational Health - Occupational Stress Questionnaire    Feeling of Stress : Only a little  Social Connections: Socially Integrated (02/07/2023)   Social Connection and Isolation Panel [NHANES]    Frequency of Communication with Friends and Family: More than three times a week    Frequency of Social Gatherings with Friends and Family: Once a week    Attends Religious Services: More than 4 times per year    Active Member of Clubs or Organizations: Yes    Attends Banker Meetings: More than 4 times per year    Marital Status: Married   Past Surgical History:  Procedure Laterality Date   HERNIA REPAIR  07/13/2011   RIH   INGUINAL HERNIA REPAIR  07/13/2011   Procedure: HERNIA REPAIR INGUINAL ADULT;  Surgeon: Wilmon Arms. Tsuei, MD;  Location: [facility];  Service: General;  Laterality: Right;   LAPAROTOMY N/A 02/14/2023   Procedure: EXPLORATORY LAPAROTOMY, wash out, drain placement;  Surgeon: Lucretia Roers, MD;  Location: [facility];  Service: General;  Laterality: N/A;   Past Medical History:  Diagnosis Date   Anxiety    Asthma    AS A CHILD ONLY   CHF (congestive heart failure) (HCC)    DVT (deep venous thrombosis) (HCC)    PE/DVT 2021   Erythrocytosis    Foot pain, right    painful x 1 month and swelling   HTN (hypertension)    Idiopathic gout    BP 128/89   Pulse 85   Ht 6\' 2"  (1.88 m)   Wt 233 lb 12.8 oz (106.1 kg)   SpO2 97%   BMI 30.02 kg/m   Opioid Risk Score:   Fall Risk Score:  `1  Depression screen [facility] 2/9     04/19/2023    8:44 AM 03/22/2023    8:42 AM 01/11/2023    9:03 AM 11/08/2022   11:14 AM 09/09/2022    4:25 PM 02/10/2020    3:02 PM 03/07/2018   11:27 AM  Depression screen PHQ 2/9  Decreased Interest 0 0 1 3 3  0 1  Down, Depressed, Hopeless 0 0 1 2 1  0 1  PHQ - 2 Score 0 0 2 5 4  0 2  Altered sleeping 0 0 0 0 3 0 0  Tired, decreased energy 1 0 1 3 3  0 1  Change in appetite 0 0 2 0 0 0 0  Feeling bad  or failure about yourself  0 0 0 0 0 0 1  Trouble concentrating 0 0 0 0 0 0 0  Moving slowly or fidgety/restless 0 0 0 0 0 0 0  Suicidal thoughts 0 0 0 0 0 0 0  PHQ-9 Score 1 0 5  8 10 0 4  Difficult doing work/chores Not difficult at all Not difficult at all Not difficult at all Somewhat difficult Extremely dIfficult Not difficult at all Not difficult at all    Review of Systems  Musculoskeletal:  Positive for arthralgias.       RA/ gout  Hematological:  Bruises/bleeds easily.       Apixaban  All other systems reviewed and are negative.      Objective:   Physical Exam Gen: no distress, normal appearing HEENT: oral mucosa pink and moist, NCAT Cardio: Reg rate Chest: normal effort, normal rate of breathing Abd: soft, non-distended Ext: no edema Psych: pleasant, normal affect Skin: intact Neuro: Alert and oriented x 3. Normal insight and awareness. Intact Memory. Normal language and speech. Cranial nerve exam unremarkable. MMT: 5/5 in all muscles tested. .senosry .   Musculoskeletal: Full ROM, No pain with AROM or PROM in the neck, trunk, or extremities. Posture appropriate. Minimal jt deformities on exam        Assessment & Plan:  Chronic polyarticular gout and RA with flares 2-3 x per month not controlled with current regimen. Between these flares he does fairly well.  -continue colchicine and ?uloric per rheumatology -Might benefit from prednisone on hand to use for flares -encouraged ongoing follow up and discussion with Dr. Link Snuffer regarding treatment options.  -check UDS, if clear, will start with tramadol 50mg  1 q 8 hours prn gout flares #30 -if no success with tramadol, consider another controlled agent -discussed importance of proper hydration, management of stress, and consideration of other issues he's already discussed with rheumatology. He has avoided drinking, tries to eat right and is not overweight.    Over 45 minutes  of face to face patient care time were  spent during this visit. All questions were encouraged and answered. Follow up with me in 3 mos.

## 2023-04-19 NOTE — Patient Instructions (Addendum)
 ALWAYS FEEL FREE TO CALL OUR OFFICE WITH ANY PROBLEMS OR QUESTIONS [phone])  **PLEASE NOTE** ALL MEDICATION REFILL REQUESTS (INCLUDING CONTROLLED SUBSTANCES) NEED TO BE MADE AT LEAST 7 DAYS PRIOR TO REFILL BEING DUE. ANY REFILL REQUESTS INSIDE THAT TIME FRAME MAY RESULT IN DELAYS IN RECEIVING YOUR PRESCRIPTION.    Zaydon, if you're urine is clear, we'll start tramadol 50mg  every 8 hours as needed for gout flare. #30

## 2023-04-20 NOTE — Progress Notes (Signed)
 Subjective:  Patient ID: Ryan Gibson, male    DOB: February 06, 1976  Age: 48 y.o. MRN: [id number]  CC:   Chief Complaint  Patient presents with   Anxiety    HPI:  48 year old male with multiple medical issues presents for follow up.  Patient reports that he has had worsening anxiety.  He states that Cymbalta was previously prescribed to aid in anxiety and his pain but it is not working and he has discontinued.  Patient would like to discuss use of alprazolam for anxiety.  Patient inquiring to whether I will take over FMLA regarding absences for appointments and for RA flares.  He states that his previous FMLA is for 4 appointments lasting 3 hours each a month and 3 episodes a month lasting 2 to 3 days.  Patient has recently resumed use of testosterone.  Advised to discontinue.  Patient is no longer taking Lasix.  Follows with cardiology.  Patient Active Problem List   Diagnosis Date Noted   Acquired thrombophilia (HCC) 02/14/2023   Erythrocytosis 02/07/2023   High risk medication use 12/05/2022   Anxiety 11/08/2022   Seronegative rheumatoid arthritis (HCC) 11/08/2022   History of pulmonary embolism 11/17/2021   Chronic systolic heart failure (HCC) 11/17/2021   Hyperlipidemia 06/16/2021   Dilated cardiomyopathy (HCC) 12/16/2020   Family history of early CAD 12/16/2020   Hypogonadism male 11/24/2015   Gout 11/01/2014   Essential hypertension 03/10/2014    Social Hx   Social History   Socioeconomic History   Marital status: Married    Spouse name: Not on file   Number of children: 4   Years of education: Not on file   Highest education level: Associate degree: occupational, Scientist, product/process development, or vocational program  Occupational History   Not on file  Tobacco Use   Smoking status: Never    Passive exposure: Never   Smokeless tobacco: Current    Types: Chew  Vaping Use   Vaping status: Never Used  Substance and Sexual Activity   Alcohol use: Yes    Comment: Social,  heavier alcohol consumption in his 20s/30s   Drug use: No   Sexual activity: Yes    Birth control/protection: None    Comment: Married  Other Topics Concern   Not on file  Social History Narrative   Married for 13 years.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   Social Drivers of Health   Financial Resource Strain: Medium Risk (02/07/2023)   Overall Financial Resource Strain (CARDIA)    Difficulty of Paying Living Expenses: Somewhat hard  Food Insecurity: No Food Insecurity (02/15/2023)   Hunger Vital Sign    Worried About Running Out of Food in the Last Year: Never true    Ran Out of Food in the Last Year: Never true  Transportation Needs: No Transportation Needs (02/15/2023)   PRAPARE - Administrator, Civil Service (Medical): No    Lack of Transportation (Non-Medical): No  Physical Activity: Insufficiently Active (02/07/2023)   Exercise Vital Sign    Days of Exercise per Week: 3 days    Minutes of Exercise per Session: 30 min  Stress: No Stress Concern Present (02/07/2023)   [employer] of Occupational Health - Occupational Stress Questionnaire    Feeling of Stress : Only a little  Social Connections: Socially Integrated (02/07/2023)   Social Connection and Isolation Panel [NHANES]    Frequency of Communication with Friends and Family: More than three times a week  Frequency of Social Gatherings with Friends and Family: Once a week    Attends Religious Services: More than 4 times per year    Active Member of [employer] or Organizations: Yes    Attends Engineer, structural: More than 4 times per year    Marital Status: Married    Review of Systems Per HPI  Objective:  BP 120/78   Pulse 78   Temp 97.7 F (36.5 C)   Ht 6\' 2"  (1.88 m)   Wt 233 lb (105.7 kg)   SpO2 99%   BMI 29.92 kg/m      04/19/2023   10:22 AM 04/19/2023    8:33 AM 04/18/2023   10:29 AM  BP/Weight  Systolic BP 128 120 128  Diastolic BP 89 78  80  Wt. (Lbs) 233.8 233 232  BMI 30.02 kg/m2 29.92 kg/m2 29.79 kg/m2    Physical Exam Vitals and nursing note reviewed.  Constitutional:      General: He is not in acute distress.    Appearance: Normal appearance.  HENT:     Head: Normocephalic and atraumatic.  Eyes:     General:        Right eye: No discharge.        Left eye: No discharge.     Conjunctiva/sclera: Conjunctivae normal.  Cardiovascular:     Rate and Rhythm: Normal rate and regular rhythm.  Pulmonary:     Effort: Pulmonary effort is normal.     Breath sounds: Normal breath sounds. No wheezing, rhonchi or rales.  Neurological:     Mental Status: He is alert.  Psychiatric:        Mood and Affect: Mood normal.        Behavior: Behavior normal.     Lab Results  Component Value Date   WBC 11.5 (H) 03/21/2023   HGB 15.0 03/21/2023   HCT 44.5 03/21/2023   PLT 390 03/21/2023   GLUCOSE 93 03/21/2023   CHOL 215 (H) 03/21/2023   TRIG 295 (H) 03/21/2023   HDL 38 (L) 03/21/2023   LDLDIRECT 82 07/23/2020   LDLCALC 125 (H) 03/21/2023   ALT 19 03/21/2023   AST 18 03/21/2023   NA 141 03/21/2023   K 4.1 03/21/2023   CL 92 (L) 03/21/2023   CREATININE 1.15 03/21/2023   BUN 17 03/21/2023   CO2 31 (H) 03/21/2023   TSH 1.56 12/01/2022   INR 1.1 02/23/2023   HGBA1C 4.7 (L) 09/10/2022     Assessment & Plan:  Hypogonadism male Assessment & Plan: Due to his cardiovascular history and erythrocytosis, I am discontinuing testosterone therapy.   Essential hypertension Assessment & Plan: BP stable.  Lasix discontinued from medication list.  Continue Entresto, spironolactone, and metoprolol.   Anxiety Assessment & Plan: Alprazolam as directed.  Advised to use appropriately.  Orders: -     ALPRAZolam; Take 1 tablet (0.5 mg total) by mouth 2 (two) times daily as needed for anxiety.  Dispense: 60 tablet; Refill: 1    Follow-up:  3-6 months  Tashae Inda Adriana Simas DO [facility]

## 2023-04-20 NOTE — Assessment & Plan Note (Signed)
 BP stable.  Lasix discontinued from medication list.  Continue Entresto, spironolactone, and metoprolol.

## 2023-04-20 NOTE — Assessment & Plan Note (Signed)
 Alprazolam as directed.  Advised to use appropriately.

## 2023-04-20 NOTE — Assessment & Plan Note (Signed)
 Due to his cardiovascular history and erythrocytosis, I am discontinuing testosterone therapy.

## 2023-04-22 LAB — TOXASSURE SELECT,+ANTIDEPR,UR

## 2023-04-26 ENCOUNTER — Encounter: Payer: Self-pay | Admitting: Family Medicine

## 2023-04-26 DIAGNOSIS — Z0279 Encounter for issue of other medical certificate: Secondary | ICD-10-CM

## 2023-04-27 ENCOUNTER — Encounter (HOSPITAL_COMMUNITY): Payer: Self-pay

## 2023-04-27 ENCOUNTER — Encounter (HOSPITAL_COMMUNITY)
Admission: RE | Admit: 2023-04-27 | Discharge: 2023-04-27 | Disposition: A | Discharge status: Home / Self Care | Payer: BC Managed Care – PPO | Source: Ambulatory Visit | Attending: Internal Medicine | Admitting: Internal Medicine

## 2023-04-27 ENCOUNTER — Ambulatory Visit (HOSPITAL_BASED_OUTPATIENT_CLINIC_OR_DEPARTMENT_OTHER)
Admission: RE | Admit: 2023-04-27 | Discharge: 2023-04-27 | Disposition: A | Discharge status: Home / Self Care | Source: Ambulatory Visit | Attending: Physician Assistant | Admitting: Physician Assistant

## 2023-04-27 DIAGNOSIS — R55 Syncope and collapse: Secondary | ICD-10-CM | POA: Diagnosis not present

## 2023-04-27 DIAGNOSIS — R42 Dizziness and giddiness: Secondary | ICD-10-CM | POA: Insufficient documentation

## 2023-05-01 ENCOUNTER — Ambulatory Visit (HOSPITAL_COMMUNITY)
Admission: RE | Admit: 2023-05-01 | Discharge: 2023-05-01 | Disposition: A | Discharge status: Home / Self Care | Payer: BC Managed Care – PPO | Source: Ambulatory Visit | Attending: Internal Medicine | Admitting: Internal Medicine

## 2023-05-01 ENCOUNTER — Ambulatory Visit (HOSPITAL_COMMUNITY): Payer: Self-pay | Admitting: Anesthesiology

## 2023-05-01 ENCOUNTER — Encounter (HOSPITAL_COMMUNITY)
Admission: RE | Disposition: A | Discharge status: Home / Self Care | Payer: Self-pay | Source: Ambulatory Visit | Attending: Internal Medicine

## 2023-05-01 ENCOUNTER — Encounter: Payer: Self-pay | Admitting: Physician Assistant

## 2023-05-01 ENCOUNTER — Encounter: Payer: Self-pay | Admitting: Physical Medicine & Rehabilitation

## 2023-05-01 ENCOUNTER — Encounter (HOSPITAL_COMMUNITY): Payer: Self-pay | Admitting: Internal Medicine

## 2023-05-01 DIAGNOSIS — K648 Other hemorrhoids: Secondary | ICD-10-CM | POA: Diagnosis not present

## 2023-05-01 DIAGNOSIS — Z5986 Financial insecurity: Secondary | ICD-10-CM | POA: Insufficient documentation

## 2023-05-01 DIAGNOSIS — I509 Heart failure, unspecified: Secondary | ICD-10-CM | POA: Insufficient documentation

## 2023-05-01 DIAGNOSIS — K635 Polyp of colon: Secondary | ICD-10-CM | POA: Diagnosis not present

## 2023-05-01 DIAGNOSIS — K6389 Other specified diseases of intestine: Secondary | ICD-10-CM

## 2023-05-01 DIAGNOSIS — Z8249 Family history of ischemic heart disease and other diseases of the circulatory system: Secondary | ICD-10-CM | POA: Insufficient documentation

## 2023-05-01 DIAGNOSIS — F419 Anxiety disorder, unspecified: Secondary | ICD-10-CM | POA: Diagnosis not present

## 2023-05-01 DIAGNOSIS — J45909 Unspecified asthma, uncomplicated: Secondary | ICD-10-CM | POA: Diagnosis not present

## 2023-05-01 DIAGNOSIS — I110 Hypertensive heart disease with heart failure: Secondary | ICD-10-CM | POA: Diagnosis not present

## 2023-05-01 DIAGNOSIS — D509 Iron deficiency anemia, unspecified: Secondary | ICD-10-CM | POA: Diagnosis not present

## 2023-05-01 DIAGNOSIS — Z8719 Personal history of other diseases of the digestive system: Secondary | ICD-10-CM | POA: Insufficient documentation

## 2023-05-01 DIAGNOSIS — K529 Noninfective gastroenteritis and colitis, unspecified: Secondary | ICD-10-CM | POA: Insufficient documentation

## 2023-05-01 DIAGNOSIS — K5730 Diverticulosis of large intestine without perforation or abscess without bleeding: Secondary | ICD-10-CM | POA: Insufficient documentation

## 2023-05-01 HISTORY — PX: POLYPECTOMY: SHX5525

## 2023-05-01 HISTORY — PX: ESOPHAGOGASTRODUODENOSCOPY (EGD) WITH PROPOFOL: SHX5813

## 2023-05-01 HISTORY — PX: COLONOSCOPY WITH PROPOFOL: SHX5780

## 2023-05-01 SURGERY — COLONOSCOPY WITH PROPOFOL
Anesthesia: General

## 2023-05-01 MED ORDER — LIDOCAINE HCL (CARDIAC) PF 100 MG/5ML IV SOSY
PREFILLED_SYRINGE | INTRAVENOUS | Status: DC | PRN
  Administered 2023-05-01: 60 mg via INTRAVENOUS

## 2023-05-01 MED ORDER — PROPOFOL 500 MG/50ML IV EMUL
INTRAVENOUS | Status: DC | PRN
  Administered 2023-05-01 (×2): 40 mg via INTRAVENOUS
  Administered 2023-05-01: 200 ug/kg/min via INTRAVENOUS
  Administered 2023-05-01: 120 mg via INTRAVENOUS

## 2023-05-01 MED ORDER — STERILE WATER FOR IRRIGATION IR SOLN
Status: DC | PRN
  Administered 2023-05-01: 120 mL

## 2023-05-01 MED ORDER — LIDOCAINE HCL (PF) 2 % IJ SOLN
INTRAMUSCULAR | Status: AC
  Filled 2023-05-01: qty 5

## 2023-05-01 MED ORDER — MIDAZOLAM HCL 2 MG/2ML IJ SOLN
INTRAMUSCULAR | Status: AC
  Filled 2023-05-01: qty 2

## 2023-05-01 MED ORDER — PROPOFOL 500 MG/50ML IV EMUL
INTRAVENOUS | Status: AC
  Filled 2023-05-01: qty 50

## 2023-05-01 MED ORDER — MIDAZOLAM HCL 5 MG/5ML IJ SOLN
INTRAMUSCULAR | Status: DC | PRN
  Administered 2023-05-01: 2 mg via INTRAVENOUS

## 2023-05-01 MED ORDER — LACTATED RINGERS IV SOLN: INTRAVENOUS | Status: DC | PRN

## 2023-05-01 NOTE — Transfer of Care (Signed)
 Immediate Anesthesia Transfer of Care Note  Patient: Ryan Gibson  Procedure(s) Performed: COLONOSCOPY WITH PROPOFOL ESOPHAGOGASTRODUODENOSCOPY (EGD) WITH PROPOFOL POLYPECTOMY  Patient Location: Short Stay  Anesthesia Type:General  Level of Consciousness: awake  Airway & Oxygen Therapy: Patient Spontanous Breathing  Post-op Assessment: Report given to RN and Post -op Vital signs reviewed and stable  Post vital signs: Reviewed and stable  Last Vitals:  Vitals Value Taken Time  BP 142/101 05/01/23 0815  Temp 36.6 C 05/01/23 0815  Pulse 98 05/01/23 0815  Resp 18 05/01/23 0815  SpO2 100 % 05/01/23 0815    Last Pain:  Vitals:   05/01/23 0815  TempSrc: Oral  PainSc: 0-No pain      Patients Stated Pain Goal: 5 (05/01/23 4540)  Complications: No notable events documented.

## 2023-05-01 NOTE — Op Note (Signed)
 [facility] Patient Name: Ryan Gibson Procedure Date: 05/01/2023 7:12 AM MRN: [id number] Date of Birth: 1975/08/05 Attending MD: Hennie Duos. Marletta Lor , [place], [id number] CSN: [id number] Age: 48 Admit Type: Outpatient Procedure:                Upper GI endoscopy Indications:              Iron deficiency anemia, Perforated viscus of                            unknown etiology Providers:                Hennie Duos. Marletta Lor, DO, Sheran Fava, Dyann Ruddle Referring MD:              Medicines:                See the Anesthesia note for documentation of the                            administered medications Complications:            No immediate complications. Estimated Blood Loss:     Estimated blood loss: none. Procedure:                Pre-Anesthesia Assessment:                           - The anesthesia plan was to use monitored                            anesthesia care (MAC).                           After obtaining informed consent, the endoscope was                            passed under direct vision. Throughout the                            procedure, the patient's blood pressure, pulse, and                            oxygen saturations were monitored continuously. The                            [id number]-H190 ([id number]) scope was introduced through the                            mouth, and advanced to the second part of duodenum.                            The upper GI endoscopy was accomplished without                            difficulty. The patient tolerated the procedure  well. Scope In: 7:42:12 AM Scope Out: 7:45:27 AM Total Procedure Duration: 0 hours 3 minutes 15 seconds  Findings:      The Z-line was regular.      The entire examined stomach was normal.      The duodenal bulb, first portion of the duodenum, second portion of the       duodenum and third portion of the duodenum were normal. Impression:                - Z-line regular.                           - Normal stomach.                           - Normal duodenal bulb, first portion of the                            duodenum, second portion of the duodenum and third                            portion of the duodenum.                           - No specimens collected. Moderate Sedation:      Per Anesthesia Care Recommendation:           - Patient has a contact number available for                            emergencies. The signs and symptoms of potential                            delayed complications were discussed with the                            patient. Return to normal activities tomorrow.                            Written discharge instructions were provided to the                            patient.                           - Resume previous diet.                           - Continue present medications.                           - Proceed with colonoscopy. See colonoscopy report                            for any further recommendations. Procedure Code(s):        --- Professional ---  16109, Esophagogastroduodenoscopy, flexible,                            transoral; diagnostic, including collection of                            specimen(s) by brushing or washing, when performed                            (separate procedure) Diagnosis Code(s):        --- Professional ---                           D50.9, Iron deficiency anemia, unspecified CPT copyright 2022 American Medical Association. All rights reserved. The codes documented in this report are preliminary and upon coder review may  be revised to meet current compliance requirements. Hennie Duos. Marletta Lor, DO Hennie Duos. Marletta Lor, DO 05/01/2023 7:48:42 AM This report has been signed electronically. Number of Addenda: 0

## 2023-05-01 NOTE — Op Note (Signed)
 [facility] Patient Name: Ryan Gibson Procedure Date: 05/01/2023 7:11 AM MRN: [id number] Date of Birth: 08/02/1975 Attending MD: Hennie Duos. Marletta Lor , [place], [id number] CSN: [id number] Age: 48 Admit Type: Outpatient Procedure:                Colonoscopy Indications:              Iron deficiency anemia, Follow-up of colonic                            perforation Providers:                Hennie Duos. Marletta Lor, DO, Sheran Fava, Dyann Ruddle Referring MD:              Medicines:                See the Anesthesia note for documentation of the                            administered medications Complications:            No immediate complications. Estimated Blood Loss:     Estimated blood loss was minimal. Procedure:                Pre-Anesthesia Assessment:                           - The anesthesia plan was to use monitored                            anesthesia care (MAC).                           After obtaining informed consent, the colonoscope                            was passed under direct vision. Throughout the                            procedure, the patient's blood pressure, pulse, and                            oxygen saturations were monitored continuously. The                            [id number]-HQ190L ([id number]) scope was introduced through                            the anus and advanced to the the terminal ileum,                            with identification of the appendiceal orifice and                            IC valve. The colonoscopy was somewhat difficult  due to multiple diverticula in the colon. The                            patient tolerated the procedure well. The quality                            of the bowel preparation was evaluated using the                            BBPS [facility] Bowel Preparation Scale) with scores                            of: Right Colon = 2 (minor amount of residual                             staining, small fragments of stool and/or opaque                            liquid, but mucosa seen well), Transverse Colon = 2                            (minor amount of residual staining, small fragments                            of stool and/or opaque liquid, but mucosa seen                            well) and Left Colon = 2 (minor amount of residual                            staining, small fragments of stool and/or opaque                            liquid, but mucosa seen well). The total BBPS score                            equals 6. The quality of the bowel preparation was                            good. Scope In: 7:49:30 AM Scope Out: 8:11:58 AM Scope Withdrawal Time: 0 hours 15 minutes 57 seconds  Total Procedure Duration: 0 hours 22 minutes 28 seconds  Findings:      Non-bleeding internal hemorrhoids were found during endoscopy.      Multiple medium-mouthed and small-mouthed diverticula were found in the       sigmoid colon. Angulated sigmoid colon without obvious stricture. There       was no evidence of diverticular bleeding.      The terminal ileum appeared normal.      Localized mild inflammation characterized by adherent blood and       friability was found in the cecum. ?Barotrauma. Biopsies were taken with       a cold forceps for histology.      Two sessile polyps were  found in the sigmoid colon. The polyps were 4 to       6 mm in size. These polyps were removed with a cold snare. Resection and       retrieval were complete.      The exam was otherwise without abnormality. Impression:               - Non-bleeding internal hemorrhoids.                           - Moderate diverticulosis in the sigmoid colon.                            There was no evidence of diverticular bleeding.                           - The examined portion of the ileum was normal.                           - Localized mild inflammation was found in the                             cecum. Biopsied.                           - Two 4 to 6 mm polyps in the sigmoid colon,                            removed with a cold snare. Resected and retrieved.                           - The examination was otherwise normal. Moderate Sedation:      Per Anesthesia Care Recommendation:           - Patient has a contact number available for                            emergencies. The signs and symptoms of potential                            delayed complications were discussed with the                            patient. Return to normal activities tomorrow.                            Written discharge instructions were provided to the                            patient.                           - Resume previous diet.                           - Continue present medications.                           -  Await pathology results.                           - Repeat colonoscopy for surveillance based on                            pathology results.                           - Use fiber, for example Citrucel, Fibercon, Konsyl                            or Metamucil.                           - Discussed with Dr. Henreitta Leber, etiology of patients                            perforation likely due to diverticulitis. Can                            consider elective sigmoidectomy vs continued                            monitoring. If patient would like to discuss                            further, needs to call General surgery office to                            schedule appt. Procedure Code(s):        --- Professional ---                           [phone], Colonoscopy, flexible; with removal of                            tumor(s), polyp(s), or other lesion(s) by snare                            technique                           45380, 59, Colonoscopy, flexible; with biopsy,                            single or multiple Diagnosis Code(s):        --- Professional ---                            K64.8, Other hemorrhoids                           K52.9, Noninfective gastroenteritis and colitis,                            unspecified  D12.5, Benign neoplasm of sigmoid colon                           D50.9, Iron deficiency anemia, unspecified                           K63.1, Perforation of intestine (nontraumatic)                           K57.30, Diverticulosis of large intestine without                            perforation or abscess without bleeding CPT copyright 2022 American Medical Association. All rights reserved. The codes documented in this report are preliminary and upon coder review may  be revised to meet current compliance requirements. Hennie Duos. Marletta Lor, DO Hennie Duos. Marletta Lor, DO 05/01/2023 8:20:54 AM This report has been signed electronically. Number of Addenda: 0

## 2023-05-01 NOTE — Anesthesia Postprocedure Evaluation (Signed)
 Anesthesia Post Note  Patient: Shanon Becvar  Procedure(s) Performed: COLONOSCOPY WITH PROPOFOL ESOPHAGOGASTRODUODENOSCOPY (EGD) WITH PROPOFOL POLYPECTOMY  Patient location during evaluation: Phase II Anesthesia Type: General Level of consciousness: awake Pain management: pain level controlled Vital Signs Assessment: post-procedure vital signs reviewed and stable Respiratory status: spontaneous breathing and respiratory function stable Cardiovascular status: blood pressure returned to baseline and stable Postop Assessment: no headache and no apparent nausea or vomiting Anesthetic complications: no Comments: Late entry   No notable events documented.   Last Vitals:  Vitals:   05/01/23 0815 05/01/23 0818  BP: (!) 142/101 (!) 157/75  Pulse: 98   Resp: 18   Temp: 36.6 C   SpO2: 100% 100%    Last Pain:  Vitals:   05/01/23 0815  TempSrc: Oral  PainSc: 0-No pain                 Windell Norfolk

## 2023-05-01 NOTE — Discharge Instructions (Signed)
 EGD Discharge instructions Please read the instructions outlined below and refer to this sheet in the next few weeks. These discharge instructions provide you with general information on caring for yourself after you leave the hospital. Your doctor may also give you specific instructions. While your treatment has been planned according to the most current medical practices available, unavoidable complications occasionally occur. If you have any problems or questions after discharge, please call your doctor. ACTIVITY You may resume your regular activity but move at a slower pace for the next 24 hours.  Take frequent rest periods for the next 24 hours.  Walking will help expel (get rid of) the air and reduce the bloated feeling in your abdomen.  No driving for 24 hours (because of the anesthesia (medicine) used during the test).  You may shower.  Do not sign any important legal documents or operate any machinery for 24 hours (because of the anesthesia used during the test).  NUTRITION Drink plenty of fluids.  You may resume your normal diet.  Begin with a light meal and progress to your normal diet.  Avoid alcoholic beverages for 24 hours or as instructed by your caregiver.  MEDICATIONS You may resume your normal medications unless your caregiver tells you otherwise.  WHAT YOU CAN EXPECT TODAY You may experience abdominal discomfort such as a feeling of fullness or "gas" pains.  FOLLOW-UP Your doctor will discuss the results of your test with you.  SEEK IMMEDIATE MEDICAL ATTENTION IF ANY OF THE FOLLOWING OCCUR: Excessive nausea (feeling sick to your stomach) and/or vomiting.  Severe abdominal pain and distention (swelling).  Trouble swallowing.  Temperature over 101 F (37.8 C).  Rectal bleeding or vomiting of blood.     Colonoscopy Discharge Instructions  Read the instructions outlined below and refer to this sheet in the next few weeks. These discharge instructions provide you with  general information on caring for yourself after you leave the hospital. Your doctor may also give you specific instructions. While your treatment has been planned according to the most current medical practices available, unavoidable complications occasionally occur.   ACTIVITY You may resume your regular activity, but move at a slower pace for the next 24 hours.  Take frequent rest periods for the next 24 hours.  Walking will help get rid of the air and reduce the bloated feeling in your belly (abdomen).  No driving for 24 hours (because of the medicine (anesthesia) used during the test).   Do not sign any important legal documents or operate any machinery for 24 hours (because of the anesthesia used during the test).  NUTRITION Drink plenty of fluids.  You may resume your normal diet as instructed by your doctor.  Begin with a light meal and progress to your normal diet. Heavy or fried foods are harder to digest and may make you feel sick to your stomach (nauseated).  Avoid alcoholic beverages for 24 hours or as instructed.  MEDICATIONS You may resume your normal medications unless your doctor tells you otherwise.  WHAT YOU CAN EXPECT TODAY Some feelings of bloating in the abdomen.  Passage of more gas than usual.  Spotting of blood in your stool or on the toilet paper.  IF YOU HAD POLYPS REMOVED DURING THE COLONOSCOPY: No aspirin products for 7 days or as instructed.  No alcohol for 7 days or as instructed.  Eat a soft diet for the next 24 hours.  FINDING OUT THE RESULTS OF YOUR TEST Not all test results are  available during your visit. If your test results are not back during the visit, make an appointment with your caregiver to find out the results. Do not assume everything is normal if you have not heard from your caregiver or the medical facility. It is important for you to follow up on all of your test results.  SEEK IMMEDIATE MEDICAL ATTENTION IF: You have more than a spotting of  blood in your stool.  Your belly is swollen (abdominal distention).  You are nauseated or vomiting.  You have a temperature over 101.  You have abdominal pain or discomfort that is severe or gets worse throughout the day.   Your upper endoscopy was normal.  Esophagus small bowel and stomach all appeared healthy.  Colonoscopy revealed 2 polyps which are removed successfully.  Mild inflammation in the right side your colon, this is usually due to barotrauma which is from insufflating your colon with air for colonoscopy itself.  I did take samples however we will call with these results.  You also have diverticulosis and internal hemorrhoids. I would recommend adding OTC Benefiber/Metamucil. Be sure to drink at least 6 glasses of water daily.   I discussed further with Dr. Henreitta Leber.  If you would like to discuss elective sigmoidectomy (removal of this part of your colon) then you will need to call her office to schedule appointment to discuss further.  Otherwise we will continue to monitor clinically with increased fiber therapy.   I hope you have a great rest of your week!  Hennie Duos. Marletta Lor, D.O. Gastroenterology and Hepatology [facility]

## 2023-05-01 NOTE — H&P (Signed)
 Primary Care Physician:  Tommie Sams, DO Primary Gastroenterologist:  Dr. Marletta Lor  Pre-Procedure History & Physical: HPI:  Ryan Gibson is a 48 y.o. male is here for an EGD and colonoscopy to be performed for history of perforated viscous and anemia.   Past Medical History:  Diagnosis Date   Anxiety    Asthma    AS A CHILD ONLY   CHF (congestive heart failure) (HCC)    DVT (deep venous thrombosis) (HCC)    PE/DVT 2021   Erythrocytosis    Foot pain, right    painful x 1 month and swelling   HTN (hypertension)    Idiopathic gout     Past Surgical History:  Procedure Laterality Date   HERNIA REPAIR  07/13/2011   [facility]   INGUINAL HERNIA REPAIR  07/13/2011   Procedure: HERNIA REPAIR INGUINAL ADULT;  Surgeon: Wilmon Arms. Corliss Skains, MD;  Location: [facility];  Service: General;  Laterality: Right;   LAPAROTOMY N/A 02/14/2023   Procedure: EXPLORATORY LAPAROTOMY, wash out, drain placement;  Surgeon: Lucretia Roers, MD;  Location: [facility];  Service: General;  Laterality: N/A;    Prior to Admission medications   Medication Sig Start Date End Date Taking? Authorizing Provider  ALPRAZolam Prudy Feeler) 0.5 MG tablet Take 1 tablet (0.5 mg total) by mouth 2 (two) times daily as needed for anxiety. 04/19/23   Tommie Sams, DO  colchicine 0.6 MG tablet TAKE 1 TABLET BY MOUTH EVERY DAY 01/23/23   Gearldine Bienenstock, PA-C  DULoxetine (CYMBALTA) 60 MG capsule Take 1 capsule (60 mg total) by mouth daily. 11/08/22   Cook, Jayce G, DO  ELIQUIS 5 MG TABS tablet Take 1 tablet (5 mg total) by mouth 2 (two) times daily. 11/23/22   Sharlene Dory, PA-C  empagliflozin (JARDIANCE) 10 MG TABS tablet Take 1 tablet (10 mg total) by mouth daily before breakfast. 11/23/22   Sharlene Dory, PA-C  Febuxostat 80 MG TABS TAKE 1 TABLET BY MOUTH EVERY DAY Patient taking differently: Take 80 mg by mouth daily. 01/23/23   Gearldine Bienenstock, PA-C  metoprolol succinate (TOPROL-XL) 100 MG 24 hr tablet Take 1.5 tablets (150 mg total) by  mouth daily. Take with or immediately following a meal. 11/23/22 07/21/23  Sharlene Dory, PA-C  Na Sulfate-K Sulfate-Mg Sulfate concentrate 17.5-3.13-1.6 GM/177ML SOLN Take 1 kit by mouth as directed. 03/21/23   Lanelle Bal, DO  ondansetron (ZOFRAN) 4 MG tablet Take 1 tablet (4 mg total) by mouth every 8 (eight) hours as needed for nausea or vomiting. 02/26/23   Franky Macho, MD  sacubitril-valsartan (ENTRESTO) 49-51 MG Take 1 tablet by mouth 2 (two) times daily. 11/23/22   Sharlene Dory, PA-C  spironolactone (ALDACTONE) 25 MG tablet Take 1 tablet (25 mg total) by mouth daily. 11/23/22 11/18/23  Sharlene Dory, PA-C    Allergies as of 03/21/2023 - Review Complete 03/20/2023  Allergen Reaction Noted   Vancomycin Hives 07/13/2011    Family History  Problem Relation Age of Onset   Healthy Mother    Heart disease Father    Colon cancer Neg Hx     Social History   Socioeconomic History   Marital status: Married    Spouse name: Not on file   Number of children: 4   Years of education: Not on file   Highest education level: Associate degree: occupational, Scientist, product/process development, or vocational program  Occupational History   Not on file  Tobacco Use   Smoking  status: Never    Passive exposure: Never   Smokeless tobacco: Current    Types: Chew  Vaping Use   Vaping status: Never Used  Substance and Sexual Activity   Alcohol use: Yes    Alcohol/week: 12.0 standard drinks of alcohol    Types: 12 Cans of beer per week    Comment: Social, heavier alcohol consumption in his 20s/30s   Drug use: No   Sexual activity: Yes    Birth control/protection: None    Comment: Married  Other Topics Concern   Not on file  Social History Narrative   Married for 13 years.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   Social Drivers of Health   Financial Resource Strain: Medium Risk (02/07/2023)   Overall Financial Resource Strain (CARDIA)    Difficulty of Paying Living Expenses:  Somewhat hard  Food Insecurity: No Food Insecurity (02/15/2023)   Hunger Vital Sign    Worried About Running Out of Food in the Last Year: Never true    Ran Out of Food in the Last Year: Never true  Transportation Needs: No Transportation Needs (02/15/2023)   PRAPARE - Administrator, Civil Service (Medical): No    Lack of Transportation (Non-Medical): No  Physical Activity: Insufficiently Active (02/07/2023)   Exercise Vital Sign    Days of Exercise per Week: 3 days    Minutes of Exercise per Session: 30 min  Stress: No Stress Concern Present (02/07/2023)   [employer] of Occupational Health - Occupational Stress Questionnaire    Feeling of Stress : Only a little  Social Connections: Socially Integrated (02/07/2023)   Social Connection and Isolation Panel [NHANES]    Frequency of Communication with Friends and Family: More than three times a week    Frequency of Social Gatherings with Friends and Family: Once a week    Attends Religious Services: More than 4 times per year    Active Member of [employer] or Organizations: Yes    Attends Engineer, structural: More than 4 times per year    Marital Status: Married  Catering manager Violence: Not At Risk (02/15/2023)   Humiliation, Afraid, Rape, and Kick questionnaire    Fear of Current or Ex-Partner: No    Emotionally Abused: No    Physically Abused: No    Sexually Abused: No    Review of Systems: General: Negative for fever, chills, fatigue, weakness. Eyes: Negative for vision changes.  ENT: Negative for hoarseness, difficulty swallowing , nasal congestion. CV: Negative for chest pain, angina, palpitations, dyspnea on exertion, peripheral edema.  Respiratory: Negative for dyspnea at rest, dyspnea on exertion, cough, sputum, wheezing.  GI: See history of present illness. GU:  Negative for dysuria, hematuria, urinary incontinence, urinary frequency, nocturnal urination.  MS: Negative for joint pain, low back  pain.  Derm: Negative for rash or itching.  Neuro: Negative for weakness, abnormal sensation, seizure, frequent headaches, memory loss, confusion.  Psych: Negative for anxiety, depression Endo: Negative for unusual weight change.  Heme: Negative for bruising or bleeding. Allergy: Negative for rash or hives.  Physical Exam: Vital signs in last 24 hours: Temp:  [98.5 F (36.9 C)] 98.5 F (36.9 C) (03/10 0643) Pulse Rate:  [94] 94 (03/10 0643) Resp:  [17] 17 (03/10 0643) SpO2:  [99 %] 99 % (03/10 0643) Weight:  [161 kg] 108 kg (03/10 0643)   General:   Alert,  Well-developed, well-nourished, pleasant and cooperative in NAD Head:  Normocephalic and atraumatic. Eyes:  Sclera clear, no icterus.   Conjunctiva pink. Ears:  Normal auditory acuity. Nose:  No deformity, discharge,  or lesions. Msk:  Symmetrical without gross deformities. Normal posture. Extremities:  Without clubbing or edema. Neurologic:  Alert and  oriented x4;  grossly normal neurologically. Skin:  Intact without significant lesions or rashes. Psych:  Alert and cooperative. Normal mood and affect.   Impression/Plan: Ryan Gibson is here for an EGD and colonoscopy to be performed for history of perforated viscous and anemia.   Risks, benefits, limitations, imponderables and alternatives regarding procedure have been reviewed with the patient. Questions have been answered. All parties agreeable.

## 2023-05-01 NOTE — Anesthesia Preprocedure Evaluation (Signed)
 Anesthesia Evaluation  Patient identified by MRN, date of birth, ID band Patient awake    Reviewed: Allergy & Precautions, H&P , NPO status , Patient's Chart, lab work & pertinent test results, reviewed documented beta blocker date and time   Airway Mallampati: II  TM Distance: >3 FB Neck ROM: full    Dental no notable dental hx.    Pulmonary neg pulmonary ROS, asthma    Pulmonary exam normal breath sounds clear to auscultation       Cardiovascular Exercise Tolerance: Good hypertension, +CHF  negative cardio ROS  Rhythm:regular Rate:Normal     Neuro/Psych   Anxiety     negative neurological ROS  negative psych ROS   GI/Hepatic negative GI ROS, Neg liver ROS,,,  Endo/Other  negative endocrine ROS    Renal/GU negative Renal ROS  negative genitourinary   Musculoskeletal   Abdominal   Peds  Hematology negative hematology ROS (+)   Anesthesia Other Findings   Reproductive/Obstetrics negative OB ROS                             Anesthesia Physical Anesthesia Plan  ASA: 3  Anesthesia Plan: General   Post-op Pain Management:    Induction:   PONV Risk Score and Plan: Propofol infusion  Airway Management Planned:   Additional Equipment:   Intra-op Plan:   Post-operative Plan:   Informed Consent: I have reviewed the patients History and Physical, chart, labs and discussed the procedure including the risks, benefits and alternatives for the proposed anesthesia with the patient or authorized representative who has indicated his/her understanding and acceptance.     Dental Advisory Given  Plan Discussed with: CRNA  Anesthesia Plan Comments:        Anesthesia Quick Evaluation

## 2023-05-02 ENCOUNTER — Encounter (HOSPITAL_COMMUNITY): Payer: Self-pay | Admitting: Internal Medicine

## 2023-05-02 LAB — SURGICAL PATHOLOGY

## 2023-05-07 ENCOUNTER — Encounter: Payer: Self-pay | Admitting: Family Medicine

## 2023-05-18 ENCOUNTER — Other Ambulatory Visit: Payer: Self-pay | Admitting: Physician Assistant

## 2023-05-22 NOTE — Telephone Encounter (Signed)
 Patient cancelled Enbrel new start appt again

## 2023-05-23 ENCOUNTER — Ambulatory Visit: Payer: BC Managed Care – PPO | Admitting: Pharmacist

## 2023-05-23 NOTE — Progress Notes (Deleted)
 Office Visit Note  Patient: Ryan Gibson             Date of Birth: 09-27-1975           MRN: [id number]             PCP: Tommie Sams, DO Referring: Tommie Sams, DO Visit Date: 06/06/2023 Occupation: @GUAROCC @  Subjective:  No chief complaint on file.   History of Present Illness: Ryan Gibson is a 48 y.o. male ***     Activities of Daily Living:  Patient reports morning stiffness for *** {minute/hour:19697}.   Patient {ACTIONS;DENIES/REPORTS:21021675::"Denies"} nocturnal pain.  Difficulty dressing/grooming: {ACTIONS;DENIES/REPORTS:21021675::"Denies"} Difficulty climbing stairs: {ACTIONS;DENIES/REPORTS:21021675::"Denies"} Difficulty getting out of chair: {ACTIONS;DENIES/REPORTS:21021675::"Denies"} Difficulty using hands for taps, buttons, cutlery, and/or writing: {ACTIONS;DENIES/REPORTS:21021675::"Denies"}  No Rheumatology ROS completed.   PMFS History:  Patient Active Problem List   Diagnosis Date Noted   Acquired thrombophilia (HCC) 02/14/2023   Erythrocytosis 02/07/2023   High risk medication use 12/05/2022   Anxiety 11/08/2022   Seronegative rheumatoid arthritis (HCC) 11/08/2022   History of pulmonary embolism 11/17/2021   Chronic systolic heart failure (HCC) 11/17/2021   Hyperlipidemia 06/16/2021   Dilated cardiomyopathy (HCC) 12/16/2020   Family history of early CAD 12/16/2020   Hypogonadism male 11/24/2015   Gout 11/01/2014   Essential hypertension 03/10/2014    Past Medical History:  Diagnosis Date   Anxiety    Asthma    AS A CHILD ONLY   CHF (congestive heart failure) (HCC)    DVT (deep venous thrombosis) (HCC)    PE/DVT 2021   Erythrocytosis    Foot pain, right    painful x 1 month and swelling   HTN (hypertension)    Idiopathic gout     Family History  Problem Relation Age of Onset   Healthy Mother    Heart disease Father    Colon cancer Neg Hx    Past Surgical History:  Procedure Laterality Date   COLONOSCOPY WITH  PROPOFOL N/A 05/01/2023   Procedure: COLONOSCOPY WITH PROPOFOL;  Surgeon: Lanelle Bal, DO;  [facility];  Service: Endoscopy;  Laterality: N/A;  730am, ASA 4   ESOPHAGOGASTRODUODENOSCOPY (EGD) WITH PROPOFOL N/A 05/01/2023   Procedure: ESOPHAGOGASTRODUODENOSCOPY (EGD) WITH PROPOFOL;  Surgeon: Lanelle Bal, DO;  [facility];  Service: Endoscopy;  Laterality: N/A;   HERNIA REPAIR  07/13/2011   [facility]   INGUINAL HERNIA REPAIR  07/13/2011   Procedure: HERNIA REPAIR INGUINAL ADULT;  Surgeon: Wilmon Arms. 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   Immunization History  Administered Date(s) Administered   Tdap 03/07/2018     Objective: Vital Signs: There were no vitals taken for this visit.   Physical Exam   Musculoskeletal Exam: ***  CDAI Exam: CDAI Score: -- Patient Global: --; Provider Global: -- Swollen: --; Tender: -- Joint Exam 06/06/2023   No joint exam has been documented for this visit   There is currently no information documented on the homunculus. Go to the Rheumatology activity  and complete the homunculus joint exam.  Investigation: No additional findings.  Imaging: US Carotid Duplex Bilateral Result Date: 04/27/2023 CLINICAL DATA:  48 year old male with syncope EXAM: BILATERAL CAROTID DUPLEX ULTRASOUND TECHNIQUE: Wallace Cullens scale imaging, color Doppler and duplex ultrasound were performed of bilateral carotid and vertebral arteries in the neck. COMPARISON:  None Available.  FINDINGS: Criteria: Quantification of carotid stenosis is based on velocity parameters that correlate the residual internal carotid diameter with NASCET-based stenosis levels, using the diameter of the distal internal carotid lumen as the denominator for stenosis measurement. The following velocity measurements were obtained: RIGHT ICA:  Systolic 59 cm/sec, Diastolic 22 cm/sec CCA:  105 cm/sec SYSTOLIC ICA/CCA RATIO:  0.5 ECA:  98 cm/sec LEFT ICA:  Systolic 54 cm/sec, Diastolic 22 cm/sec CCA:  89 cm/sec SYSTOLIC ICA/CCA RATIO:  0.6 ECA:  94 cm/sec Right Brachial SBP: Not acquired Left Brachial SBP: Not acquired RIGHT CAROTID ARTERY: Unremarkable appearance of the carotid system without significant atherosclerotic disease. Intermediate waveform of the CCA. Low resistance waveform of the ICA. No significant tortuosity of the carotid system. RIGHT VERTEBRAL ARTERY: Antegrade flow with low resistance waveform. LEFT CAROTID ARTERY: Unremarkable appearance of the carotid system without significant atherosclerotic disease. Intermediate waveform of the CCA. Low resistance waveform of the ICA. No significant tortuosity of the carotid system. LEFT VERTEBRAL ARTERY:  Antegrade flow with low resistance waveform. IMPRESSION: Color duplex indicates no significant plaque, with no hemodynamically significant stenosis by duplex criteria in the extracranial cerebrovascular circulation. Signed, Yvone Neu. Miachel Roux, RPVI [facility] Electronically Signed   By: Gilmer Mor D.O.   On: 04/27/2023 14:55    Recent Labs: Lab Results  Component Value Date   WBC 11.5 (H) 03/21/2023   HGB 15.0 03/21/2023   PLT 390 03/21/2023   NA 141 03/21/2023   K 4.1 03/21/2023   CL 92 (L) 03/21/2023   CO2 31 (H) 03/21/2023   GLUCOSE 93 03/21/2023   BUN 17 03/21/2023   CREATININE 1.15 03/21/2023   BILITOT 1.2 03/21/2023   ALKPHOS 66 03/21/2023   AST 18 03/21/2023   ALT 19  03/21/2023   PROT 8.1 03/21/2023   ALBUMIN 4.9 03/21/2023   CALCIUM 10.3 (H) 03/21/2023   GFRAA 96 02/10/2020   QFTBGOLDPLUS NEGATIVE 12/14/2022    Speciality Comments: No specialty comments available.  Procedures:  No procedures performed Allergies: Vancomycin   Assessment / Plan:     Visit Diagnoses: Seronegative rheumatoid arthritis (HCC)  High risk medication use  Idiopathic chronic gout of multiple sites without tophus  Positive ANA (antinuclear antibody)  Pain in both wrists  Chronic pain of left knee  Effusion, right knee  Chronic pain of both ankles  Pain in both feet  Achilles tendinitis of left lower extremity  History of pulmonary embolism  Depression, major, single episode, moderate (HCC)  Chronic systolic heart failure (HCC)  Dilated cardiomyopathy (HCC)  Essential hypertension  History of hyperlipidemia  Hypogonadism male  Vitamin D deficiency  Other fatigue  Polycythemia  Elevated hemoglobin (HCC)  Orders: No orders of the defined types were placed in this encounter.  No orders of the defined types were placed in this encounter.   Face-to-face time spent with patient was *** minutes. Greater than 50% of time was spent in counseling and coordination of care.  Follow-Up Instructions: No follow-ups on file.   Gearldine Bienenstock, PA-C  Note - This record has been created using Dragon software.  Chart creation errors have  been sought, but may not always  have been located. Such creation errors do not reflect on  the standard of medical care.

## 2023-06-05 DIAGNOSIS — R768 Other specified abnormal immunological findings in serum: Secondary | ICD-10-CM | POA: Diagnosis not present

## 2023-06-05 DIAGNOSIS — M25531 Pain in right wrist: Secondary | ICD-10-CM | POA: Diagnosis not present

## 2023-06-05 DIAGNOSIS — M25532 Pain in left wrist: Secondary | ICD-10-CM | POA: Diagnosis not present

## 2023-06-05 DIAGNOSIS — G8929 Other chronic pain: Secondary | ICD-10-CM | POA: Diagnosis not present

## 2023-06-05 DIAGNOSIS — M0609 Rheumatoid arthritis without rheumatoid factor, multiple sites: Secondary | ICD-10-CM | POA: Diagnosis not present

## 2023-06-05 DIAGNOSIS — M25562 Pain in left knee: Secondary | ICD-10-CM | POA: Diagnosis not present

## 2023-06-05 DIAGNOSIS — M1A09X0 Idiopathic chronic gout, multiple sites, without tophus (tophi): Secondary | ICD-10-CM | POA: Diagnosis not present

## 2023-06-06 ENCOUNTER — Ambulatory Visit: Payer: BC Managed Care – PPO | Admitting: Physician Assistant

## 2023-06-06 DIAGNOSIS — M1A09X0 Idiopathic chronic gout, multiple sites, without tophus (tophi): Secondary | ICD-10-CM

## 2023-06-06 DIAGNOSIS — D582 Other hemoglobinopathies: Secondary | ICD-10-CM

## 2023-06-06 DIAGNOSIS — I420 Dilated cardiomyopathy: Secondary | ICD-10-CM

## 2023-06-06 DIAGNOSIS — M7662 Achilles tendinitis, left leg: Secondary | ICD-10-CM

## 2023-06-06 DIAGNOSIS — R5383 Other fatigue: Secondary | ICD-10-CM

## 2023-06-06 DIAGNOSIS — G8929 Other chronic pain: Secondary | ICD-10-CM

## 2023-06-06 DIAGNOSIS — M25531 Pain in right wrist: Secondary | ICD-10-CM

## 2023-06-06 DIAGNOSIS — D751 Secondary polycythemia: Secondary | ICD-10-CM

## 2023-06-06 DIAGNOSIS — E559 Vitamin D deficiency, unspecified: Secondary | ICD-10-CM

## 2023-06-06 DIAGNOSIS — I10 Essential (primary) hypertension: Secondary | ICD-10-CM

## 2023-06-06 DIAGNOSIS — Z79899 Other long term (current) drug therapy: Secondary | ICD-10-CM

## 2023-06-06 DIAGNOSIS — E291 Testicular hypofunction: Secondary | ICD-10-CM

## 2023-06-06 DIAGNOSIS — M25461 Effusion, right knee: Secondary | ICD-10-CM

## 2023-06-06 DIAGNOSIS — I5022 Chronic systolic (congestive) heart failure: Secondary | ICD-10-CM

## 2023-06-06 DIAGNOSIS — Z86711 Personal history of pulmonary embolism: Secondary | ICD-10-CM

## 2023-06-06 DIAGNOSIS — Z8639 Personal history of other endocrine, nutritional and metabolic disease: Secondary | ICD-10-CM

## 2023-06-06 DIAGNOSIS — M0600 Rheumatoid arthritis without rheumatoid factor, unspecified site: Secondary | ICD-10-CM

## 2023-06-06 DIAGNOSIS — R768 Other specified abnormal immunological findings in serum: Secondary | ICD-10-CM

## 2023-06-06 DIAGNOSIS — F321 Major depressive disorder, single episode, moderate: Secondary | ICD-10-CM

## 2023-06-06 DIAGNOSIS — M79671 Pain in right foot: Secondary | ICD-10-CM

## 2023-06-06 NOTE — Telephone Encounter (Signed)
 Patient cancelled appt. No follow-up scheduled. Closing encounter

## 2023-06-27 DIAGNOSIS — Z125 Encounter for screening for malignant neoplasm of prostate: Secondary | ICD-10-CM | POA: Diagnosis not present

## 2023-06-27 DIAGNOSIS — N5201 Erectile dysfunction due to arterial insufficiency: Secondary | ICD-10-CM | POA: Diagnosis not present

## 2023-06-27 DIAGNOSIS — E349 Endocrine disorder, unspecified: Secondary | ICD-10-CM | POA: Diagnosis not present

## 2023-07-12 ENCOUNTER — Encounter: Payer: BC Managed Care – PPO | Admitting: Physical Medicine & Rehabilitation

## 2023-07-21 ENCOUNTER — Ambulatory Visit: Payer: BC Managed Care – PPO | Admitting: Family Medicine

## 2023-08-07 DIAGNOSIS — E349 Endocrine disorder, unspecified: Secondary | ICD-10-CM | POA: Diagnosis not present

## 2023-08-30 ENCOUNTER — Other Ambulatory Visit: Payer: Self-pay | Admitting: Physician Assistant

## 2023-08-31 ENCOUNTER — Other Ambulatory Visit: Payer: Self-pay | Admitting: Physician Assistant

## 2023-10-03 ENCOUNTER — Ambulatory Visit (INDEPENDENT_AMBULATORY_CARE_PROVIDER_SITE_OTHER): Admitting: Family Medicine

## 2023-10-03 ENCOUNTER — Encounter: Payer: Self-pay | Admitting: Family Medicine

## 2023-10-03 VITALS — BP 112/87 | HR 87 | Temp 97.0°F | Ht 74.0 in | Wt 253.0 lb

## 2023-10-03 DIAGNOSIS — Z0000 Encounter for general adult medical examination without abnormal findings: Secondary | ICD-10-CM | POA: Insufficient documentation

## 2023-10-03 DIAGNOSIS — Z0001 Encounter for general adult medical examination with abnormal findings: Secondary | ICD-10-CM

## 2023-10-03 DIAGNOSIS — E785 Hyperlipidemia, unspecified: Secondary | ICD-10-CM

## 2023-10-03 DIAGNOSIS — I10 Essential (primary) hypertension: Secondary | ICD-10-CM | POA: Diagnosis not present

## 2023-10-03 DIAGNOSIS — D6869 Other thrombophilia: Secondary | ICD-10-CM

## 2023-10-03 NOTE — Assessment & Plan Note (Signed)
 Overall doing well.  Will proceed with pneumococcal vaccine if he is amenable once we have these back in stock. Labs today.  Given his cardiac history, I think he would benefit from Repatha.  He does not tolerate statin.  Will send Repatha and once lipid panel returns.  Will fill out FMLA once he brings this in.

## 2023-10-03 NOTE — Patient Instructions (Signed)
 Labs today.   I will send in the Repatha.  Bring in your FMLA.  Follow up in 6 months.

## 2023-10-03 NOTE — Progress Notes (Signed)
 Subjective:  Patient ID: Ryan Gibson, male    DOB: 11/15/1975  Age: 48 y.o. MRN: [id number]  CC:   Chief Complaint  Patient presents with   Annual Exam    HPI:  48 year old male with HTN, Hx of systolic heart failure, history of hypogonadism with most recent labs within normal limits, history of PE on chronic anticoagulation, anxiety, hyperlipidemia, gout presents for an annual physical exam.  Patient states that overall he is doing well.  He is no longer having issues with his joints.  He is compliant with Uloric  and colchicine .  Recently seen by rheumatology.  Uric acid at goal.  BP stable on spironolactone , Entresto , metoprolol .  Patient's anxiety currently stable on Cymbalta .  Patient states that he needs his FMLA renewed.  He will bring the forms in.  Patient concerned about his lipids.  He states that he has not tolerated statin therapy.  Statin caused arthralgia.  He has tried rosuvastatin  and atorvastatin .  He is interested in discussing PCSK9.  In regards to his health maintenance, colonoscopy up-to-date.  Tetanus up-to-date.  Needs pneumococcal vaccine.  We do not have any available currently.  We do not yet have any flu vaccines either.  Patient Active Problem List   Diagnosis Date Noted   Annual physical exam 10/03/2023   Acquired thrombophilia (HCC) 02/14/2023   Erythrocytosis 02/07/2023   High risk medication use 12/05/2022   Anxiety 11/08/2022   History of pulmonary embolism 11/17/2021   Chronic systolic heart failure (HCC) 11/17/2021   Hyperlipidemia 06/16/2021   Dilated cardiomyopathy (HCC) 12/16/2020   Family history of early CAD 12/16/2020   Hypogonadism male 11/24/2015   Gout 11/01/2014   Essential hypertension 03/10/2014    Social Hx   Social History   Socioeconomic History   Marital status: Married    Spouse name: Not on file   Number of children: 4   Years of education: Not on file   Highest education level: Associate degree:  occupational, Scientist, product/process development, or vocational program  Occupational History   Not on file  Tobacco Use   Smoking status: Never    Passive exposure: Never   Smokeless tobacco: Current    Types: Chew  Vaping Use   Vaping status: Never Used  Substance and Sexual Activity   Alcohol use: Yes    Alcohol/week: 12.0 standard drinks of alcohol    Types: 12 Cans of beer per week    Comment: Social, heavier alcohol consumption in his 20s/30s   Drug use: No   Sexual activity: Yes    Birth control/protection: None    Comment: Married  Other Topics Concern   Not on file  Social History Narrative   Married for 13 years.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   Social Drivers of Corporate investment banker Strain: Low Risk  (10/02/2023)   Overall Financial Resource Strain (CARDIA)    Difficulty of Paying Living Expenses: Not hard at all  Food Insecurity: No Food Insecurity (10/02/2023)   Hunger Vital Sign    Worried About Running Out of Food in the Last Year: Never true    Ran Out of Food in the Last Year: Never true  Transportation Needs: No Transportation Needs (10/02/2023)   PRAPARE - Administrator, Civil Service (Medical): No    Lack of Transportation (Non-Medical): No  Physical Activity: Insufficiently Active (10/02/2023)   Exercise Vital Sign    Days of Exercise per Week: 2 days  Minutes of Exercise per Session: 30 min  Stress: Stress Concern Present (10/02/2023)   [employer] of Occupational Health - Occupational Stress Questionnaire    Feeling of Stress: To some extent  Social Connections: Socially Integrated (10/02/2023)   Social Connection and Isolation Panel    Frequency of Communication with Friends and Family: More than three times a week    Frequency of Social Gatherings with Friends and Family: Once a week    Attends Religious Services: More than 4 times per year    Active Member of [employer] or Organizations: Yes    Attends Museum/gallery exhibitions officer: More than 4 times per year    Marital Status: Married    Review of Systems  Constitutional: Negative.   Respiratory: Negative.    Cardiovascular: Negative.     Objective:  BP 112/87   Pulse 87   Temp (!) 97 F (36.1 C)   Ht 6' 2 (1.88 m)   Wt 253 lb (114.8 kg)   SpO2 98%   BMI 32.48 kg/m      10/03/2023    8:20 AM 05/01/2023    8:18 AM 05/01/2023    8:15 AM  BP/Weight  Systolic BP 112 157 142  Diastolic BP 87 75 101  Wt. (Lbs) 253    BMI 32.48 kg/m2      Physical Exam Vitals and nursing note reviewed.  Constitutional:      General: He is not in acute distress.    Appearance: Normal appearance.  HENT:     Head: Normocephalic and atraumatic.  Eyes:     General:        Right eye: No discharge.        Left eye: No discharge.     Conjunctiva/sclera: Conjunctivae normal.  Cardiovascular:     Rate and Rhythm: Normal rate and regular rhythm.  Pulmonary:     Effort: Pulmonary effort is normal.     Breath sounds: Normal breath sounds. No wheezing, rhonchi or rales.  Neurological:     Mental Status: He is alert.  Psychiatric:        Mood and Affect: Mood normal.        Behavior: Behavior normal.     Lab Results  Component Value Date   WBC 11.5 (H) 03/21/2023   HGB 15.0 03/21/2023   HCT 44.5 03/21/2023   PLT 390 03/21/2023   GLUCOSE 93 03/21/2023   CHOL 215 (H) 03/21/2023   TRIG 295 (H) 03/21/2023   HDL 38 (L) 03/21/2023   LDLDIRECT 82 07/23/2020   LDLCALC 125 (H) 03/21/2023   ALT 19 03/21/2023   AST 18 03/21/2023   NA 141 03/21/2023   K 4.1 03/21/2023   CL 92 (L) 03/21/2023   CREATININE 1.15 03/21/2023   BUN 17 03/21/2023   CO2 31 (H) 03/21/2023   TSH 1.56 12/01/2022   INR 1.1 02/23/2023   HGBA1C 4.7 (L) 09/10/2022     Assessment & Plan:  Annual physical exam Assessment & Plan: Overall doing well.  Will proceed with pneumococcal vaccine if he is amenable once we have these back in stock. Labs today.  Given his cardiac  history, I think he would benefit from Repatha.  He does not tolerate statin.  Will send Repatha and once lipid panel returns.  Will fill out FMLA once he brings this in.   Hyperlipidemia, unspecified hyperlipidemia type -     Lipid panel  Acquired thrombophilia (HCC) -     CBC  Essential hypertension -     CMP14+EGFR -     Microalbumin / creatinine urine ratio    Follow-up: 6 months  Chayim Bialas Bluford DO [facility]

## 2023-10-06 ENCOUNTER — Telehealth: Payer: Self-pay

## 2023-10-06 NOTE — Telephone Encounter (Signed)
 Patient dropped off document FMLA, to be filled out by provider. Patient requested to send it back via Call Patient to pick up within ASAP. Document is located in providers tray at front office.Please advise at Mobile [phone] (mobile)

## 2023-10-11 ENCOUNTER — Encounter: Payer: Self-pay | Admitting: Family Medicine

## 2023-10-11 DIAGNOSIS — Z0279 Encounter for issue of other medical certificate: Secondary | ICD-10-CM

## 2023-11-25 ENCOUNTER — Other Ambulatory Visit: Payer: Self-pay | Admitting: Physician Assistant

## 2023-12-08 ENCOUNTER — Other Ambulatory Visit: Payer: Self-pay | Admitting: Physician Assistant

## 2024-01-23 ENCOUNTER — Other Ambulatory Visit: Payer: Self-pay | Admitting: Physician Assistant

## 2024-01-29 MED ORDER — EMPAGLIFLOZIN 10 MG PO TABS: 10.0000 mg | ORAL_TABLET | Freq: Every day | ORAL | 0 refills | Status: DC

## 2024-03-04 ENCOUNTER — Encounter: Payer: Self-pay | Admitting: Family Medicine

## 2024-03-04 ENCOUNTER — Ambulatory Visit: Admitting: Family Medicine

## 2024-03-04 VITALS — BP 154/99 | HR 99 | Temp 98.1°F | Ht 74.0 in | Wt 258.0 lb

## 2024-03-04 DIAGNOSIS — D751 Secondary polycythemia: Secondary | ICD-10-CM

## 2024-03-04 DIAGNOSIS — M1A09X0 Idiopathic chronic gout, multiple sites, without tophus (tophi): Secondary | ICD-10-CM | POA: Diagnosis not present

## 2024-03-04 DIAGNOSIS — I10 Essential (primary) hypertension: Secondary | ICD-10-CM | POA: Diagnosis not present

## 2024-03-04 DIAGNOSIS — E785 Hyperlipidemia, unspecified: Secondary | ICD-10-CM

## 2024-03-04 DIAGNOSIS — Z86711 Personal history of pulmonary embolism: Secondary | ICD-10-CM

## 2024-03-04 DIAGNOSIS — I5022 Chronic systolic (congestive) heart failure: Secondary | ICD-10-CM

## 2024-03-04 DIAGNOSIS — F419 Anxiety disorder, unspecified: Secondary | ICD-10-CM | POA: Diagnosis not present

## 2024-03-04 MED ORDER — ELIQUIS 5 MG PO TABS: 5.0000 mg | ORAL_TABLET | Freq: Two times a day (BID) | ORAL | 3 refills | Status: DC

## 2024-03-04 MED ORDER — METOPROLOL SUCCINATE ER 100 MG PO TB24: 150.0000 mg | ORAL_TABLET | Freq: Every day | ORAL | 3 refills | Status: DC

## 2024-03-04 MED ORDER — EMPAGLIFLOZIN 10 MG PO TABS: 10.0000 mg | ORAL_TABLET | Freq: Every day | ORAL | 0 refills | Status: DC

## 2024-03-04 MED ORDER — SACUBITRIL-VALSARTAN 49-51 MG PO TABS: 1.0000 | ORAL_TABLET | Freq: Two times a day (BID) | ORAL | 3 refills | Status: DC

## 2024-03-04 MED ORDER — ALPRAZOLAM 1 MG PO TABS: 1.0000 mg | ORAL_TABLET | Freq: Two times a day (BID) | ORAL | 1 refills | Status: AC | PRN

## 2024-03-04 MED ORDER — SPIRONOLACTONE 25 MG PO TABS: 25.0000 mg | ORAL_TABLET | Freq: Every day | ORAL | 3 refills | Status: DC

## 2024-03-04 NOTE — Progress Notes (Signed)
 "  Subjective:  Patient ID: Ryan Gibson, male    DOB: 06-Sep-1975  Age: 49 y.o. MRN: [id number]  CC:   Chief Complaint  Patient presents with   5 month follow up    Pt has not been on any of his meds since August 2025 due to loss of employment - refill xanax  but at an increased dose     HPI:  49 year old presents for follow-up.  Patient lost his job and is now just secured active health insurance again.  He is currently out of all of his medications.  He is hypertensive.  Has upcoming follow-up with cardiology.  Patient reports that his wife recently left.  He is having significant anxiety.  He is requesting increase in dose of alprazolam .  Reports recent alcohol use.  Patient denies any recent gout flares.  Patient Active Problem List   Diagnosis Date Noted   Acquired thrombophilia 02/14/2023   Erythrocytosis 02/07/2023   High risk medication use 12/05/2022   Anxiety 11/08/2022   History of pulmonary embolism 11/17/2021   Chronic systolic heart failure (HCC) 11/17/2021   Hyperlipidemia 06/16/2021   Dilated cardiomyopathy (HCC) 12/16/2020   Family history of early CAD 12/16/2020   Hypogonadism male 11/24/2015   Gout 11/01/2014   Essential hypertension 03/10/2014    Social Hx   Social History   Socioeconomic History   Marital status: Married    Spouse name: Not on file   Number of children: 4   Years of education: Not on file   Highest education level: Associate degree: occupational, scientist, product/process development, or vocational program  Occupational History   Not on file  Tobacco Use   Smoking status: Never    Passive exposure: Never   Smokeless tobacco: Current    Types: Chew  Vaping Use   Vaping status: Never Used  Substance and Sexual Activity   Alcohol use: Yes    Alcohol/week: 12.0 standard drinks of alcohol    Types: 12 Cans of beer per week    Comment: Social, heavier alcohol consumption in his 20s/30s   Drug use: No   Sexual activity: Yes    Birth  control/protection: None    Comment: Married  Other Topics Concern   Not on file  Social History Narrative   Married for 13 years.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   Social Drivers of Health   Tobacco Use: High Risk (03/04/2024)   Patient History    Smoking Tobacco Use: Never    Smokeless Tobacco Use: Current    Passive Exposure: Never  Financial Resource Strain: Low Risk (10/02/2023)   Overall Financial Resource Strain (CARDIA)    Difficulty of Paying Living Expenses: Not hard at all  Food Insecurity: No Food Insecurity (10/02/2023)   Epic    Worried About Programme Researcher, Broadcasting/film/video in the Last Year: Never true    Ran Out of Food in the Last Year: Never true  Transportation Needs: No Transportation Needs (10/02/2023)   Epic    Lack of Transportation (Medical): No    Lack of Transportation (Non-Medical): No  Physical Activity: Insufficiently Active (10/02/2023)   Exercise Vital Sign    Days of Exercise per Week: 2 days    Minutes of Exercise per Session: 30 min  Stress: Stress Concern Present (10/02/2023)   [employer] of Occupational Health - Occupational Stress Questionnaire    Feeling of Stress: To some extent  Social Connections: Socially Integrated (10/02/2023)   Social Connection and Isolation Panel  Frequency of Communication with Friends and Family: More than three times a week    Frequency of Social Gatherings with Friends and Family: Once a week    Attends Religious Services: More than 4 times per year    Active Member of Clubs or Organizations: Yes    Attends Banker Meetings: More than 4 times per year    Marital Status: Married  Depression (PHQ2-9): Medium Risk (03/04/2024)   Depression (PHQ2-9)    PHQ-2 Score: 6  Alcohol Screen: Low Risk (10/02/2023)   Alcohol Screen    Last Alcohol Screening Score (AUDIT): 7  Housing: Unknown (10/02/2023)   Epic    Unable to Pay for Housing in the Last Year: No    Number of Times  Moved in the Last Year: Not on file    Homeless in the Last Year: No  Utilities: Low Risk (06/05/2023)   Received from [facility]   Utilities    In the past 12 months has the electric, gas, oil, or water  company threatened to shut off services in your home? : No  Health Literacy: Not on file    Review of Systems Per HPI  Objective:  BP (!) 154/99   Pulse 99   Temp 98.1 F (36.7 C)   Ht 6' 2 (1.88 m)   Wt 258 lb (117 kg)   SpO2 98%   BMI 33.13 kg/m      03/04/2024    9:54 AM 03/04/2024    9:22 AM 10/03/2023    8:20 AM  BP/Weight  Systolic BP 154 176 112  Diastolic BP 99 116 87  Wt. (Lbs)  258 253  BMI  33.13 kg/m2 32.48 kg/m2    Physical Exam Vitals and nursing note reviewed.  Constitutional:      General: He is not in acute distress.    Appearance: Normal appearance.  HENT:     Head: Normocephalic and atraumatic.  Eyes:     General:        Right eye: No discharge.        Left eye: No discharge.     Conjunctiva/sclera: Conjunctivae normal.  Cardiovascular:     Rate and Rhythm: Normal rate and regular rhythm.  Pulmonary:     Effort: Pulmonary effort is normal.     Breath sounds: Normal breath sounds. No wheezing, rhonchi or rales.  Neurological:     Mental Status: He is alert.  Psychiatric:        Mood and Affect: Mood normal.        Behavior: Behavior normal.     Lab Results  Component Value Date   WBC 11.5 (H) 03/21/2023   HGB 15.0 03/21/2023   HCT 44.5 03/21/2023   PLT 390 03/21/2023   GLUCOSE 93 03/21/2023   CHOL 215 (H) 03/21/2023   TRIG 295 (H) 03/21/2023   HDL 38 (L) 03/21/2023   LDLDIRECT 82 07/23/2020   LDLCALC 125 (H) 03/21/2023   ALT 19 03/21/2023   AST 18 03/21/2023   NA 141 03/21/2023   K 4.1 03/21/2023   CL 92 (L) 03/21/2023   CREATININE 1.15 03/21/2023   BUN 17 03/21/2023   CO2 31 (H) 03/21/2023   TSH 1.56 12/01/2022   INR 1.1 02/23/2023   HGBA1C 4.7 (L) 09/10/2022     Assessment & Plan:  Chronic systolic heart  failure (HCC) Assessment & Plan: Euvolemic here today.  Needs follow-up with cardiology.  Last EF was 50 to 55%.  Restarting Entresto ,  spironolactone , metoprolol , Jardiance .  Labs ordered.  Orders: -     Metoprolol  Succinate ER; Take 1.5 tablets (150 mg total) by mouth daily. Take with or immediately following a meal.  Dispense: 135 tablet; Refill: 3 -     Sacubitril -Valsartan ; Take 1 tablet by mouth 2 (two) times daily.  Dispense: 180 tablet; Refill: 3 -     Spironolactone ; Take 1 tablet (25 mg total) by mouth daily.  Dispense: 90 tablet; Refill: 3 -     Empagliflozin ; Take 1 tablet (10 mg total) by mouth daily before breakfast.  Dispense: 90 tablet; Refill: 0  Erythrocytosis -     CBC  Hyperlipidemia, unspecified hyperlipidemia type -     Lipid panel  Essential hypertension Assessment & Plan: Uncontrolled.  Restarting medications.  Labs ordered.  Orders: -     CMP14+EGFR -     Microalbumin / creatinine urine ratio  Anxiety Assessment & Plan: Uncontrolled.  Alprazolam  refilled.  Orders: -     ALPRAZolam ; Take 1 tablet (1 mg total) by mouth 2 (two) times daily as needed for anxiety.  Dispense: 60 tablet; Refill: 1  Idiopathic chronic gout of multiple sites without tophus Assessment & Plan: No recent flares.  Off medications.  Reassessing uric acid level.  Orders: -     Uric acid  History of pulmonary embolism Assessment & Plan: Restarting Eliquis .  Orders: -     Eliquis ; Take 1 tablet (5 mg total) by mouth 2 (two) times daily.  Dispense: 180 tablet; Refill: 3    Follow-up: 3 months  Kiya Eno Bluford DO [facility] "

## 2024-03-04 NOTE — Assessment & Plan Note (Signed)
 Uncontrolled.  Alprazolam  refilled.

## 2024-03-04 NOTE — Assessment & Plan Note (Addendum)
 Euvolemic here today.  Needs follow-up with cardiology.  Last EF was 50 to 55%.  Restarting Entresto , spironolactone , metoprolol , Jardiance .  Labs ordered.

## 2024-03-04 NOTE — Assessment & Plan Note (Signed)
 No recent flares.  Off medications.  Reassessing uric acid level.

## 2024-03-04 NOTE — Assessment & Plan Note (Signed)
 Uncontrolled.  Restarting medications.  Labs ordered.

## 2024-03-04 NOTE — Patient Instructions (Signed)
 Medications sent in.  Labs in ~ 7-10 days.  Follow up in 3 months.

## 2024-03-04 NOTE — Assessment & Plan Note (Signed)
 Restarting Eliquis

## 2024-03-13 ENCOUNTER — Encounter (HOSPITAL_BASED_OUTPATIENT_CLINIC_OR_DEPARTMENT_OTHER): Payer: Self-pay

## 2024-03-15 ENCOUNTER — Encounter: Payer: Self-pay | Admitting: Physician Assistant

## 2024-03-15 ENCOUNTER — Ambulatory Visit: Attending: Physician Assistant | Admitting: Physician Assistant

## 2024-03-15 VITALS — BP 178/92 | HR 110 | Ht 74.0 in | Wt 253.0 lb

## 2024-03-15 DIAGNOSIS — I2699 Other pulmonary embolism without acute cor pulmonale: Secondary | ICD-10-CM

## 2024-03-15 DIAGNOSIS — Z86711 Personal history of pulmonary embolism: Secondary | ICD-10-CM

## 2024-03-15 DIAGNOSIS — I5022 Chronic systolic (congestive) heart failure: Secondary | ICD-10-CM | POA: Diagnosis not present

## 2024-03-15 DIAGNOSIS — R609 Edema, unspecified: Secondary | ICD-10-CM | POA: Diagnosis not present

## 2024-03-15 DIAGNOSIS — M0600 Rheumatoid arthritis without rheumatoid factor, unspecified site: Secondary | ICD-10-CM | POA: Diagnosis not present

## 2024-03-15 DIAGNOSIS — I10 Essential (primary) hypertension: Secondary | ICD-10-CM | POA: Diagnosis not present

## 2024-03-15 DIAGNOSIS — E782 Mixed hyperlipidemia: Secondary | ICD-10-CM | POA: Diagnosis not present

## 2024-03-15 MED ORDER — METOPROLOL SUCCINATE ER 100 MG PO TB24: 100.0000 mg | ORAL_TABLET | Freq: Every day | ORAL | 2 refills | Status: AC

## 2024-03-15 MED ORDER — EMPAGLIFLOZIN 10 MG PO TABS: 10.0000 mg | ORAL_TABLET | Freq: Every day | ORAL | 1 refills | Status: AC

## 2024-03-15 MED ORDER — ELIQUIS 5 MG PO TABS: 5.0000 mg | ORAL_TABLET | Freq: Two times a day (BID) | ORAL | 1 refills | Status: AC

## 2024-03-15 MED ORDER — SPIRONOLACTONE 25 MG PO TABS: 25.0000 mg | ORAL_TABLET | Freq: Every day | ORAL | 2 refills | Status: AC

## 2024-03-15 MED ORDER — SACUBITRIL-VALSARTAN 49-51 MG PO TABS: 1.0000 | ORAL_TABLET | Freq: Two times a day (BID) | ORAL | 2 refills | Status: AC

## 2024-03-15 NOTE — Progress Notes (Signed)
 " Cardiology Office Note:  .   Date:  03/15/2024  ID:  Ryan Gibson, DOB 09/24/1975, MRN [id number] PCP: Ryan Jacqulyn MATSU, DO  [facility] Providers Cardiologist:  Gordy Bergamo, MD {   History of Present Illness: .   Ryan Gibson is a 49 y.o. male with a past medical history of hypertension, tobacco use disorder, alcohol abuse, strong family history of premature coronary artery disease his father had coronary artery disease in his 57s here for follow-up appointment.  Patient presented to [facility] in 01/2020 where he was diagnosed with acute bilateral pulmonary emboli, DVT with right lower extremity and severe dilated cardiomyopathy with LVEF less than 20% as well as acute cholecystitis.  Suspected DVT and PE are related to underlying COVID-19 infection.  Cardiomyopathy likely related to alcohol abuse.  Patient wants initiated on guideline directed medical therapy including Jardiance , metoprolol , and Entresto .  Since admission 12/21 patient has reduced alcohol intake and echocardiogram was repeated 06/2021 which revealed LVEF improved to 40 to 45%.  Patient was seen in the office 03/23/2021 at which time spironolactone  12.5 mg was added.  Patient subsequently increased mental acting on to 25 mg daily.  Currently taking furosemide  40 mg daily.  Had recent labs done and BMP remained stable.  Patient is overall feeling very well without any specific complaints.  Denies chest pain, orthopnea, dyspnea, leg edema.  Continue to reduce alcohol intake and was drinking 1-2 beers per week.  This was 08/04/2021.  He was seen by me 11/23/22, he tells me that he turned in paperwork for patient assistance a year ago with Dr. Godfrey office and never heard anything back.  He has been living off of samples since January and he is intermittently been taking his medications.  Today blood pressure is 178/120.  I told him that I should be sending him to the ER but he refused to go.  Out of all his medications.  Did  not take anything this morning.  Heart rate is in the 90s.  We sent refills of Lasix , metoprolol , and spironolactone  today and we have given him coupons for his Eliquis , Jardiance , and Entresto .  Unfortunately, we do not have any samples available in the office of these medications.  I reach out to social work to follow-up with him.  He stopped taking his Crestor  because it made him feel bad.  He is now doing testosterone  supplementation for fatigue.  However, we discussed that his fatigue could very well be multifactorial.  He is on prednisone  for his rheumatoid arthritis  I also sent in a prescription for valsartan  160 mg daily just in case he is unable to obtain Entresto  in the next few days.  Obviously, would prefer Entresto  but patient does need blood pressure medication at all times.  This was emphasized to him.  I saw him 01/24/23,  he presents with a history of heart disease and rheumatoid arthritis with a flare of rheumatoid arthritis, experiencing severe pain in the knee and wrist. The patient also reports a sensation of his heart swelling and then taking off again, a symptom he previously experienced when his heart function was significantly reduced. The patient has been self-adjusting his dose of Lasix , a diuretic medication, due to perceived lack of efficacy at the prescribed dose. The patient has also been undergoing evaluation by an oncologist for potential re-diagnosis with Stills disease.   Reports no chest pain, pressure, or tightness. No edema, orthopnea, PND.   Discussed the use of  AI scribe software for clinical note transcription with the patient, who gave verbal consent to proceed.  Today, he presents with a history of diverticulitis  with dizziness that started after an episode of diverticulitis. The dizziness is described as a head rush, with blurry vision, and a feeling of passing out. The episodes occur several times a day, regardless of the patient's position - sitting,  standing, or lying down. The patient has tried stopping spironolactone , but it made no difference. The patient does not have a blood pressure monitor at home to check his blood pressure during these episodes. The patient also mentions having a colonoscopy scheduled for diverticulitis and a possible echo for his heart. The patient also discusses starting Repatha for high LDL cholesterol levels.  Reports no shortness of breath nor dyspnea on exertion. Reports no chest pain, pressure, or tightness. No edema, orthopnea, PND. Reports no palpitations.    Discussed the use of AI scribe software for clinical note transcription with the patient, who gave verbal consent to proceed.  Today, he presents with heart failure and prior blood clots who presents for medication management and evaluation of his heart condition.  He has been off Entresto , Jardiance , spironolactone , metoprolol , and Eliquis  since August and has not taken any doses today, though he recently refilled them. He states he could not afford the medication but now he can.  He has heart failure with prior EF 25% that improved to 50-55% on medication. He is worried that six months off therapy may have worsened his EF. He had an MI in his twenties in the setting of heavy alcohol use. His father had coronary bypass in his early fifties, and there is a family history of heart problems on his father's side.  He previously took metoprolol  150 mg daily in divided doses. He recently noted heart rates over 100 bpm and is considering increasing to 200 mg daily. He has a home device to monitor heart rate and blood pressure.  He questions the need for ongoing Eliquis , prescribed after bilateral blood clots and a right lower extremity DVT about two years ago, and reports that prior workup for genetic thrombophilia was unrevealing.  He recalls prior severe decompensated heart failure with pulmonary edema that required hospitalization and initiation of his  current regimen. He has had no recent episodes of dyspnea, edema, or other similar symptoms and does not feel markedly different off medications, though he understands he should be on treatment.  Reports no shortness of breath nor dyspnea on exertion. Reports no chest pain, pressure, or tightness. No edema, orthopnea, PND. Reports no palpitations.   Discussed the use of AI scribe software for clinical note transcription with the patient, who gave verbal consent to proceed.  ROS: Pertinent ROS in HPI  Studies Reviewed: .        Echocardiogram 03/01/2022:  Left ventricle cavity is mildly dilated. Normal left ventricular wall  thickness. Hypokinetic global wall motion. Abnormal septal wall motion due  to left bundle branch block. Normal diastolic filling pattern. Moderately  depressed LV systolic function with visual EF 35-40%. Calculated EF 43%.  The aortic root is mildly dilated at 4.2 cm at sinus of valsalva.  Ascending aorta is normal in size. .  Compared to the study done on 07/13/2021, no significant change, mild  aortic root dilatation at sinus of Valsalva is new.       Physical Exam:   VS:  BP (!) 178/92   Pulse (!) 58   Ht 6' 2 (  1.88 m)   Wt 253 lb (114.8 kg)   SpO2 96%   BMI 32.48 kg/m    Wt Readings from Last 3 Encounters:  03/15/24 253 lb (114.8 kg)  03/04/24 258 lb (117 kg)  10/03/23 253 lb (114.8 kg)    GEN: Well nourished, well developed in no acute distress NECK: No JVD; No carotid bruits CARDIAC: RRR, no murmurs, rubs, gallops RESPIRATORY:  Clear to auscultation without rales, wheezing or rhonchi  ABDOMEN: Soft, non-tender, non-distended EXTREMITIES:  No edema; No deformity   ASSESSMENT AND PLAN: .     Chronic systolic heart failure Ejection fraction previously improved to 50-55% on medication. Off medications for six months, leading to elevated blood pressure and heart rate. Echocardiogram needed to assess current heart function and medication necessity.  Discussed importance of resuming medications to prevent regression. - Resume Entresto  and metoprolol  immediately. - Increase metoprolol  to 200 mg daily, taken as two 100 mg tablets once daily. - Monitor heart rate and blood pressure at home. - Order echocardiogram in March after consistent medication use for 2-3 months. - Patient preferred to just take his spirolactone and jardiance  as well   History of pulmonary embolism and deep vein thrombosis Currently on Eliquis  for anticoagulation. Uncertainty about necessity of continued anticoagulation two years post-clots. No genetic condition identified. Previous cancer evaluation negative. Discussed possibility of discontinuing Eliquis  and consulting with Dr. Ladona. - Consult with Dr. Ladona, I did let the patient know he will probably need to continue this medication.  Hyperlipidemia Elevated LDL (125) not on a statin -would recommend Repatha. -Discuss with primary care provider during upcoming appointment. -he prefers to do labs with his PCP  Hypertension Elevated today, off medications -encouraged him to restart his Entresto  and Metoprolol  Succinate  Sinus Tachycardia -would recommend increasing the metoprolol  and monitoring his BP and HR from home -He is asked to reach out in the next 1-2 weeks      Dispo: He an follow-up in 6 months with Dr. Ladona  Signed, Orren LOISE Fabry, PA-C   "

## 2024-03-15 NOTE — Patient Instructions (Addendum)
 Medication Instructions:  INCREASE Metoprolol  Succinate to 100mg  Take 1 tablet twice a day  *If you need a refill on your cardiac medications before your next appointment, please call your pharmacy*  Lab Work: None ordered If you have labs (blood work) drawn today and your tests are completely normal, you will receive your results only by: MyChart Message (if you have MyChart) OR A paper copy in the mail If you have any lab test that is abnormal or we need to change your treatment, we will call you to review the results.  Testing/Procedures: Your physician has requested that you have an echocardiogram. Echocardiography is a painless test that uses sound waves to create images of your heart. It provides your doctor with information about the size and shape of your heart and how well your hearts chambers and valves are working. This procedure takes approximately one hour. There are no restrictions for this procedure. Please do NOT wear cologne, perfume, aftershave, or lotions (deodorant is allowed). Please arrive 15 minutes prior to your appointment time.  Please note: We ask at that you not bring children with you during ultrasound (echo/ vascular) testing. Due to room size and safety concerns, children are not allowed in the ultrasound rooms during exams. Our front office staff cannot provide observation of children in our lobby area while testing is being conducted. An adult accompanying a patient to their appointment will only be allowed in the ultrasound room at the discretion of the ultrasound technician under special circumstances. We apologize for any inconvenience.  Follow-Up: At [facility], you and your health needs are our priority.  As part of our continuing mission to provide you with exceptional heart care, our providers are all part of one team.  This team includes your primary Cardiologist (physician) and Advanced Practice Providers or APPs (Physician Assistants and Nurse  Practitioners) who all work together to provide you with the care you need, when you need it.  Your next appointment:   6 month(s)  Provider:   Gordy Bergamo, MD    We recommend signing up for the patient portal called MyChart.  Sign up information is provided on this After Visit Summary.  MyChart is used to connect with patients for Virtual Visits (Telemedicine).  Patients are able to view lab/test results, encounter notes, upcoming appointments, etc.  Non-urgent messages can be sent to your provider as well.   To learn more about what you can do with MyChart, go to [email or web address].   Other Instructions

## 2024-05-03 ENCOUNTER — Ambulatory Visit (HOSPITAL_COMMUNITY)

## 2024-06-03 ENCOUNTER — Ambulatory Visit: Admitting: Family Medicine
